# Patient Record
Sex: Female | Born: 1965 | Race: White | Hispanic: No | Marital: Single | Smoking: Never smoker
Health system: Southern US, Community
[De-identification: ages and names within clinical notes are randomized; demographics above are authoritative.]

## PROBLEM LIST (undated history)

## (undated) DIAGNOSIS — R609 Edema, unspecified: Secondary | ICD-10-CM

## (undated) DIAGNOSIS — R55 Syncope and collapse: Secondary | ICD-10-CM

## (undated) DIAGNOSIS — R600 Localized edema: Secondary | ICD-10-CM

## (undated) DIAGNOSIS — R0600 Dyspnea, unspecified: Secondary | ICD-10-CM

## (undated) DIAGNOSIS — N189 Chronic kidney disease, unspecified: Secondary | ICD-10-CM

## (undated) DIAGNOSIS — Z86711 Personal history of pulmonary embolism: Secondary | ICD-10-CM

## (undated) DIAGNOSIS — E282 Polycystic ovarian syndrome: Secondary | ICD-10-CM

## (undated) DIAGNOSIS — G4730 Sleep apnea, unspecified: Secondary | ICD-10-CM

## (undated) DIAGNOSIS — G43909 Migraine, unspecified, not intractable, without status migrainosus: Secondary | ICD-10-CM

## (undated) DIAGNOSIS — H5440 Blindness, one eye, unspecified eye: Secondary | ICD-10-CM

## (undated) DIAGNOSIS — M1990 Unspecified osteoarthritis, unspecified site: Secondary | ICD-10-CM

## (undated) DIAGNOSIS — E669 Obesity, unspecified: Secondary | ICD-10-CM

## (undated) DIAGNOSIS — Z9359 Other cystostomy status: Secondary | ICD-10-CM

## (undated) DIAGNOSIS — F32A Depression, unspecified: Secondary | ICD-10-CM

## (undated) DIAGNOSIS — I10 Essential (primary) hypertension: Secondary | ICD-10-CM

## (undated) DIAGNOSIS — F329 Major depressive disorder, single episode, unspecified: Secondary | ICD-10-CM

## (undated) DIAGNOSIS — F419 Anxiety disorder, unspecified: Secondary | ICD-10-CM

## (undated) DIAGNOSIS — I639 Cerebral infarction, unspecified: Secondary | ICD-10-CM

## (undated) HISTORY — DX: Unspecified osteoarthritis, unspecified site: M19.90

## (undated) HISTORY — DX: Essential (primary) hypertension: I10

## (undated) HISTORY — DX: Migraine, unspecified, not intractable, without status migrainosus: G43.909

## (undated) HISTORY — DX: Localized edema: R60.0

## (undated) HISTORY — PX: NASAL SINUS SURGERY: SHX719

## (undated) HISTORY — DX: Obesity, unspecified: E66.9

## (undated) HISTORY — DX: Syncope and collapse: R55

## (undated) HISTORY — DX: Polycystic ovarian syndrome: E28.2

## (undated) HISTORY — PX: ABDOMINAL SURGERY: SHX537

## (undated) HISTORY — DX: Edema, unspecified: R60.9

## (undated) HISTORY — DX: Cerebral infarction, unspecified: I63.9

---

## 1988-10-21 HISTORY — PX: NASAL SINUS SURGERY: SHX719

## 1999-06-13 ENCOUNTER — Other Ambulatory Visit: Admission: RE | Admit: 1999-06-13 | Discharge: 1999-06-13 | Payer: Self-pay | Admitting: Obstetrics and Gynecology

## 2000-06-13 ENCOUNTER — Other Ambulatory Visit: Admission: RE | Admit: 2000-06-13 | Discharge: 2000-06-13 | Payer: Self-pay | Admitting: Gynecology

## 2001-06-15 ENCOUNTER — Other Ambulatory Visit: Admission: RE | Admit: 2001-06-15 | Discharge: 2001-06-15 | Payer: Self-pay | Admitting: Gynecology

## 2001-12-30 ENCOUNTER — Emergency Department (HOSPITAL_COMMUNITY): Admission: EM | Admit: 2001-12-30 | Discharge: 2001-12-30 | Payer: Self-pay | Admitting: Emergency Medicine

## 2002-04-07 ENCOUNTER — Ambulatory Visit (HOSPITAL_COMMUNITY): Admission: RE | Admit: 2002-04-07 | Discharge: 2002-04-07 | Payer: Self-pay | Admitting: Family Medicine

## 2002-10-20 ENCOUNTER — Other Ambulatory Visit: Admission: RE | Admit: 2002-10-20 | Discharge: 2002-10-20 | Payer: Self-pay | Admitting: Gynecology

## 2002-10-21 HISTORY — PX: SHOULDER SURGERY: SHX246

## 2003-07-05 ENCOUNTER — Ambulatory Visit (HOSPITAL_BASED_OUTPATIENT_CLINIC_OR_DEPARTMENT_OTHER): Admission: RE | Admit: 2003-07-05 | Discharge: 2003-07-05 | Payer: Self-pay | Admitting: Orthopaedic Surgery

## 2003-10-24 ENCOUNTER — Other Ambulatory Visit: Admission: RE | Admit: 2003-10-24 | Discharge: 2003-10-24 | Payer: Self-pay | Admitting: Gynecology

## 2004-03-18 ENCOUNTER — Emergency Department (HOSPITAL_COMMUNITY): Admission: EM | Admit: 2004-03-18 | Discharge: 2004-03-18 | Payer: Self-pay | Admitting: Emergency Medicine

## 2004-10-24 ENCOUNTER — Other Ambulatory Visit: Admission: RE | Admit: 2004-10-24 | Discharge: 2004-10-24 | Payer: Self-pay | Admitting: Gynecology

## 2005-05-16 ENCOUNTER — Encounter: Admission: RE | Admit: 2005-05-16 | Discharge: 2005-05-16 | Payer: Self-pay | Admitting: Surgery

## 2005-06-26 ENCOUNTER — Ambulatory Visit: Admission: RE | Admit: 2005-06-26 | Discharge: 2005-06-26 | Payer: Self-pay | Admitting: Surgery

## 2005-11-14 ENCOUNTER — Other Ambulatory Visit: Admission: RE | Admit: 2005-11-14 | Discharge: 2005-11-14 | Payer: Self-pay | Admitting: Gynecology

## 2006-11-17 ENCOUNTER — Other Ambulatory Visit: Admission: RE | Admit: 2006-11-17 | Discharge: 2006-11-17 | Payer: Self-pay | Admitting: Gynecology

## 2007-06-22 HISTORY — PX: FOOT SURGERY: SHX648

## 2007-10-12 ENCOUNTER — Emergency Department (HOSPITAL_COMMUNITY): Admission: EM | Admit: 2007-10-12 | Discharge: 2007-10-13 | Payer: Self-pay | Admitting: Emergency Medicine

## 2007-11-10 ENCOUNTER — Encounter (INDEPENDENT_AMBULATORY_CARE_PROVIDER_SITE_OTHER): Payer: Self-pay | Admitting: General Surgery

## 2007-11-10 ENCOUNTER — Ambulatory Visit (HOSPITAL_COMMUNITY): Admission: RE | Admit: 2007-11-10 | Discharge: 2007-11-10 | Payer: Self-pay | Admitting: General Surgery

## 2007-11-10 HISTORY — PX: CHOLECYSTECTOMY: SHX55

## 2007-12-16 ENCOUNTER — Other Ambulatory Visit: Admission: RE | Admit: 2007-12-16 | Discharge: 2007-12-16 | Payer: Self-pay | Admitting: Gynecology

## 2008-02-24 ENCOUNTER — Emergency Department (HOSPITAL_COMMUNITY): Admission: EM | Admit: 2008-02-24 | Discharge: 2008-02-25 | Payer: Self-pay | Admitting: Emergency Medicine

## 2008-09-20 HISTORY — PX: KNEE ARTHROSCOPY: SHX127

## 2008-09-29 ENCOUNTER — Ambulatory Visit (HOSPITAL_COMMUNITY): Admission: RE | Admit: 2008-09-29 | Discharge: 2008-09-29 | Payer: Self-pay | Admitting: Orthopaedic Surgery

## 2009-04-19 ENCOUNTER — Other Ambulatory Visit: Admission: RE | Admit: 2009-04-19 | Discharge: 2009-04-19 | Payer: Self-pay | Admitting: Gynecology

## 2009-04-19 ENCOUNTER — Encounter: Payer: Self-pay | Admitting: Women's Health

## 2009-04-19 ENCOUNTER — Ambulatory Visit: Payer: Self-pay | Admitting: Women's Health

## 2009-07-04 ENCOUNTER — Ambulatory Visit (HOSPITAL_COMMUNITY): Admission: RE | Admit: 2009-07-04 | Discharge: 2009-07-04 | Payer: Self-pay | Admitting: Orthopaedic Surgery

## 2009-08-17 ENCOUNTER — Encounter: Admission: RE | Admit: 2009-08-17 | Discharge: 2009-08-17 | Payer: Self-pay | Admitting: Orthopaedic Surgery

## 2010-01-19 HISTORY — PX: KNEE ARTHROSCOPY: SHX127

## 2010-01-25 ENCOUNTER — Ambulatory Visit (HOSPITAL_COMMUNITY): Admission: RE | Admit: 2010-01-25 | Discharge: 2010-01-25 | Payer: Self-pay | Admitting: Orthopaedic Surgery

## 2010-10-18 ENCOUNTER — Ambulatory Visit: Payer: Self-pay | Admitting: Women's Health

## 2011-01-09 LAB — CBC
HCT: 42.2 % (ref 36.0–46.0)
Hemoglobin: 14.6 g/dL (ref 12.0–15.0)
MCHC: 34.5 g/dL (ref 30.0–36.0)
MCV: 94.7 fL (ref 78.0–100.0)
Platelets: 292 10*3/uL (ref 150–400)
RBC: 4.46 MIL/uL (ref 3.87–5.11)
RDW: 12.7 % (ref 11.5–15.5)
WBC: 10.7 10*3/uL — ABNORMAL HIGH (ref 4.0–10.5)

## 2011-01-09 LAB — BASIC METABOLIC PANEL
BUN: 13 mg/dL (ref 6–23)
CO2: 26 mEq/L (ref 19–32)
Calcium: 9.6 mg/dL (ref 8.4–10.5)
Chloride: 100 mEq/L (ref 96–112)
Creatinine, Ser: 0.8 mg/dL (ref 0.4–1.2)
GFR calc Af Amer: >60 mL/min (ref >60)
GFR calc non Af Amer: >60 mL/min (ref >60)
Glucose, Bld: 283 mg/dL — ABNORMAL HIGH (ref 70–99)
Potassium: 4.4 mEq/L (ref 3.5–5.1)
Sodium: 134 mEq/L — ABNORMAL LOW (ref 135–145)

## 2011-01-09 LAB — GLUCOSE, CAPILLARY
Glucose-Capillary: 250 mg/dL — ABNORMAL HIGH (ref 70–99)
Glucose-Capillary: 275 mg/dL — ABNORMAL HIGH (ref 70–99)

## 2011-03-05 NOTE — Op Note (Signed)
Kristen [name], Kristen [name]              ACCOUNT NO.:  [id number]   MEDICAL RECORD NO.:  [id number]          PATIENT TYPE:  AMB   LOCATION:  DAY                          FACILITY:  [facility]   PHYSICIAN:  Sammuel Hines. Daiva Nakayama, M.D. DATE OF BIRTH:  1966/09/17   DATE OF PROCEDURE:  11/10/2007  DATE OF DISCHARGE:                               OPERATIVE REPORT   PREOPERATIVE DIAGNOSIS:  Gallstones.   POSTOPERATIVE DIAGNOSIS:  Gallstones.   PROCEDURE:  Laparoscopic cholecystectomy with intraoperative  cholangiogram.   SURGEON:  Sammuel Hines. Daiva Nakayama, M.D.   ANESTHESIA:  General endotracheal.   PROCEDURE:  After informed consent was obtained, the patient was brought  to the operating room and placed in supine position on the operating  table.  After induction of general anesthesia, the patient's abdomen was  prepped with Betadine and draped in the usual sterile manner.  The area  above the umbilicus infiltrated with 0.25% Marcaine.   A small incision was made with a 15 blade knife.  This incision was  carried down through the subcutaneous tissue bluntly with appendiceal  retractors and Kelly clamp until the linea alba was identified.  The  linea alba was incised with a 15 blade knife, and each side was grasped  Kocher clamps and elevated anteriorly.  Of note, I did find mesh at this  location which we divided with a 15 blade knife.  The preperitoneal  space was then probed bluntly with a hemostat until the peritoneum was  opened and access was gained to the abdominal cavity.  A 0 Vicryl  pursestring stitch was placed in the fascia around the opening.  An  Hasson cannula was placed through the opening and anchored in place with  the previously placed Vicryl pursestring stitch.  The abdomen was then  insufflated carbon dioxide without difficulty.  The patient was placed  in reverse Trendelenburg position, rotated with the right side up.  Next, the epigastric area was infiltrated with 0.25% Marcaine.  A  small  incision was made with a 15 blade knife.  A 10-mm port was placed  bluntly through this incision into the abdominal cavity under direct  vision.  Sites were chosen laterally on the right side of the abdomen  for placement of 5-mm ports.  Each of these areas was infiltrated with  0.25% Marcaine.  Small stab incisions were made with a 15 blade knife,  and 5-mm ports were placed bluntly through these incisions into the  abdominal cavity under direct vision.  A blunt grasper was placed  through the lateral-most 5-mm port and used to grasp the dome of  gallbladder and elevate it anteriorly and superiorly.  Another blunt  grasper was placed through the other 5-mm port and used to retract on  the body and neck of the gallbladder.  A dissector was placed in the  epigastric port.  Using electrocautery, the peritoneal reflection of the  gallbladder neck was opened.  Blunt dissection was then carried out in  this area until the gallbladder neck/cystic duct junction was readily  identified and a good window was created.  A single clip was placed on  the gallbladder neck.  A small ductotomy was made just below the clip.  A 14-gauge Angiocath was placed percutaneously through the anterior  abdominal wall under direct vision.  A Reddick cholangiogram catheter  was placed through the Angiocath and flushed.  The Reddick catheter was  then placed within the cystic duct and anchored in place with a clip.  The cholangiogram was obtained.  It showed no filling defects, good  emptying in the duodenum, and good length on the cystic duct.  The  anchoring clip and catheters were removed from the patient.  Three clips  were placed proximally on the duct, and the duct was divided between the  two sets of clips.  Posteriorly, the cystic arteries were identified and  again dissected bluntly in a circumferential manner until a good window  was created.  Two clips were placed proximally and one distally in the   artery, and the artery was divided between the two.  Next, a  laparoscopic hook cautery device was used to separate the gallbladder  from the liver bed.  Prior to completely detaching the gallbladder from  the liver bed, the liver bed was inspected, and several small bleeding  points were coagulated with the cautery until the area was completely  hemostatic.  The gallbladder was then detached the rest of the way from  the liver bed without difficulty.  A laparoscopic bag was then inserted  through the epigastric port, and the gallbladder was placed in the bag  and the bag was sealed.  The abdomen was then irrigated with copious  amounts of saline until the effluent was clear.  The laparoscope was  moved to the epigastric port.  A gallbladder grabber was placed through  the [name] cannula and used to grasp the open end of the bag.  The bag  with the gallbladder was removed through the supraumbilical port with  the Hasson cannula without difficulty.  The fascial defect was closed  with the previously placed Vicryl pursestring stitch as well as with  another figure-of-eight #1 Novofil stitch to close the mesh.  The rest  of the ports were removed under direct vision and were found to be  hemostatic.  Gas was allowed to escape.  The skin incisions were closed  with interrupted 4-0 Monocryl subcuticular stitches.  Dermabond  dressings were applied.   The patient tolerated the procedure well.  At the end of the case, all  needle, sponges, and instrument counts were correct.  The patient was  then awakened and taken to recovery in stable condition.      Sammuel Hines. Daiva Nakayama, M.D.  Electronically Signed     PST/MEDQ  D:  11/10/2007  T:  11/10/2007  Job:  [id number]

## 2011-03-05 NOTE — Op Note (Signed)
Kristen [name], Kristen [name]              ACCOUNT NO.:  [id number]   MEDICAL RECORD NO.:  [id number]          PATIENT TYPE:  AMB   LOCATION:  SDS                          FACILITY:  [facility]   PHYSICIAN:  Monico Blitz. Dalldorf, M.D.DATE OF BIRTH:  1966-08-22   DATE OF PROCEDURE:  09/29/2008  DATE OF DISCHARGE:  09/29/2008                               OPERATIVE REPORT   PREOPERATIVE DIAGNOSIS:  Left knee chondromalacia.   POSTOPERATIVE DIAGNOSIS:  Left knee chondromalacia.   PROCEDURES:  1. Left knee chondroplasty.  2. Left knee arthroscopic lateral release.   ANESTHESIA:  General.   SURGEON:  Monico Blitz. Rhona Raider, MD   ASSISTANT:  Roselee Nova, PA   INDICATION FOR PROCEDURE:  The patient is a 45 year old woman with a  long history of left knee pain.  This persisted despite oral anti-  inflammatories and an injection as well as an exercise program.  By MRI  scan, she has things confined to patellofemoral joint.  She has pain,  which limits her ability to rest and work and walk, and she is offered  an arthroscopy.  An informed operative consent was obtained after  discussion of possible complications of reaction to anesthesia and  infection.   SUMMARY/FINDINGS OF PROCEDURE:  Under general anesthesia, an arthroscopy  of the left knee was performed.  Suprapatellar pouch was benign while  the patellofemoral joint exhibited focal degeneration at the apex of the  patella.  The intertrochlear groove cartilage was well maintained.  She  did track in a far lateral position and her lateral structures were felt  to be very tight.  She had a tight drive-through sign.  I elected to  perform an arthroscopic lateral release.  This did improve her patellar  tracking and took some tension off the patellofemoral joint, but  certainly did not normalize things.  Medial and lateral compartments  were benign with no evidence of articular or meniscal cartilage damage.   DESCRIPTION OF PROCEDURE:  The patient  was taken to the operating suite  where general anesthetic was applied without difficulty.  This case was  done in the main operating room due to the patient's size.  She was  positioned supine, and prepped and draped in normal sterile fashion.  After the administration of IV Kefzol, an arthroscopy of left knee was  performed through a total of 3 portals.  Findings were as noted above.  The procedure consisted of the chondroplasty and apex of the patella for  some grade 3 change, followed by arthroscopic lateral release.  The pump  pressure was decreased and we tried to control some bleeding with Bovie  cautery.  The knee was irrigated followed by placement of Marcaine with  epinephrine and morphine plus Depo-Medrol.  Adaptic was placed over her  portals followed by a dry gauze and loose Ace wrap.  Estimated blood  loss and intraoperative fluids were obtained from anesthesia records.   DISPOSITION:  The patient was extubated in the operating room and taken  to recovery room in stable condition.  Plans were for to go home on same  day, pending anesthesia  clearance.  She will follow up in less than a  week.  I will contact her by phone tonight.      Monico Blitz Rhona Raider, M.D.  Electronically Signed     PGD/MEDQ  D:  09/29/2008  T:  09/30/2008  Job:  [id number]

## 2011-03-08 NOTE — Op Note (Signed)
[name], Kristen [name]              ACCOUNT NO.:  [id number]   MEDICAL RECORD NO.:  [id number]          PATIENT TYPE:  AMB   LOCATION:  DFTL                         FACILITY:  [facility]   PHYSICIAN:  Thomas A. Cornett, M.D.DATE OF BIRTH:  1966-07-03   DATE OF PROCEDURE:  06/26/2005  DATE OF DISCHARGE:                                 OPERATIVE REPORT   PREOPERATIVE DIAGNOSIS:  Umbilical hernia.   POSTOPERATIVE DIAGNOSIS:  Umbilical hernia.   PROCEDURE:  Umbilical hernia repair with Ventralex mesh.   SURGEON:  Marcello Moores A. Cornett, M.D.   ANESTHESIA:  General endotracheal anesthesia with 10 mL of 0.25% Sensorcaine  local.   ESTIMATED BLOOD LOSS:  5 mL.   SPECIMENS:  None.   INDICATIONS FOR PROCEDURE:  The patient is a 45 year old morbidly obese  family who had periumbilical and lower abdominal pain. Workup revealed an  umbilical hernia and I recommended repair of this since this was causing  discomfort. She understood the risks of the procedure but wished to proceed.   DESCRIPTION OF PROCEDURE:  The patient was brought to the operating suite  and placed supine. After induction of general endotracheal anesthesia, her  abdomen was prepped and draped in a sterile fashion. She had a very large  pannus and this had to be retracted toward her head. An incision was made  just above the belly button. Dissection was carried down until I encountered  the fascia. There was a 3 cm umbilical defect and I dissected the umbilicus  away from the fascia to expose the defect circumferentially. This was  preperitoneal with no evidence of any intraperitoneal contents within it. In  the preperitoneal space, I used a sponge to create a small area and placed a  small Ventralex mesh in the preperitoneal space. I secured this to the  undersurface of the fascia with #0 Novofil circumferentially. I then closed  tissue over the mesh to cover this with #0 Novofil. A 2-0 Vicryl was used to  tack the umbilicus down  to the fascia and 4-0 Monocryl was used to close the  skin. All sponge, needle and instrument counts were counted and found to be  correct at this portion of the case. Sterile dressings were applied. The  patient was awoke and taken to recovery in satisfactory condition.      Thomas A. Cornett, M.D.  Electronically Signed    TAC/MEDQ  D:  06/26/2005  T:  06/26/2005  Job:  [id number]   cc:   [facility]

## 2011-03-08 NOTE — Op Note (Signed)
[name], [name]                      ACCOUNT NO.:  [id number]   MEDICAL RECORD NO.:  [id number]                   PATIENT TYPE:  AMB   LOCATION:  DSC                                  FACILITY:  [facility]   PHYSICIAN:  Monico Blitz. Rhona Raider, M.D.             DATE OF BIRTH:  [birth year]   DATE OF PROCEDURE:  07/05/2003  DATE OF DISCHARGE:                                 OPERATIVE REPORT   PREOPERATIVE DIAGNOSIS:  1. Left shoulder impingement.  2. Left shoulder AC pain.   POSTOPERATIVE DIAGNOSIS:  1. Left shoulder impingement.  2. Left shoulder AC pain.   PROCEDURE:  1. Left shoulder arthroscopic acromioplasty.  2. Left shoulder arthroscopic AC resection.  3. Left shoulder arthroscopic debridement, partial rotator cuff tear.   ANESTHESIA:  General.   SURGEON:  Monico Blitz. Rhona Raider, M.D.   ASSISTANT:  Roselee Nova, P.A.   INDICATIONS FOR PROCEDURE:  The patient is a 45 year old woman with a long  history of left shoulder pain. This has persisted despite oral anti-  inflammatories and activity restriction.  She has responded in a transient  way to two subacromial injections.  At this point, she has pain with rest  and pain with activity and is offered an arthroscopy.  Informed operative  consent was obtained after discussion of the possible complications of,  reaction to anesthesia and infection.   DESCRIPTION OF PROCEDURE:  The patient was taken to the operating room where  general anesthesia was applied without difficulty.  She was positioned in a  beach chair position and prepped and draped in the usual sterile fashion.  After administration of preoperative IV antibiotics, an arthroscopy of the  left shoulder was performed through a total of four portals.  Glenohumeral  joint showed no degenerative change in the biceps tendon, rotator cuff, and  labral structures all appear benign.  In the subacromial space, she had a  great deal of bursitis with perhaps a partial  thickness rotator cuff tear. A  debridement was done.  I performed an acromioplasty through the lateral  position. I then finished this through the posterior position. This was  extremely difficult due to the very large size of her arm.  I had to revise  her posterior portal in a more inferior position and that was the reason for  the fourth portal. She did have bone on bone contact of the [facility] joint and  this was decompressed through the anterior portal creating about 1 cm space  between the distal clavicle and the acromion. The shoulder was thoroughly  irrigated at the end of the case followed by injection with the usual agents  plus Depo-Medrol.  Simple sutures of nylon were used to loosely  reapproximate the portals followed by Adaptic and a dry gauze dressing with  tape.  Estimated blood loss and intraoperative fluids can be obtained from  anesthesia records.   DISPOSITION:  The patient was extubated in  the operating room and taken to  the recovery room in stable condition.  Plans were for her to go home the  same day and follow up in the office in less than a week.  I will contact  her by phone tonight.                                               Monico Blitz Rhona Raider, M.D.    PGD/MEDQ  D:  07/05/2003  T:  07/05/2003  Job:  [id number]

## 2011-03-20 ENCOUNTER — Encounter: Payer: Self-pay | Admitting: Women's Health

## 2011-05-03 ENCOUNTER — Ambulatory Visit (INDEPENDENT_AMBULATORY_CARE_PROVIDER_SITE_OTHER): Payer: Self-pay | Admitting: General Surgery

## 2011-05-24 ENCOUNTER — Ambulatory Visit (INDEPENDENT_AMBULATORY_CARE_PROVIDER_SITE_OTHER): Payer: BC Managed Care – PPO | Admitting: Women's Health

## 2011-05-24 ENCOUNTER — Other Ambulatory Visit (HOSPITAL_COMMUNITY)
Admission: RE | Admit: 2011-05-24 | Discharge: 2011-05-24 | Disposition: A | Discharge status: Home / Self Care | Payer: BC Managed Care – PPO | Source: Ambulatory Visit | Attending: Women's Health | Admitting: Women's Health

## 2011-05-24 ENCOUNTER — Encounter: Payer: Self-pay | Admitting: Women's Health

## 2011-05-24 VITALS — BP 140/82 | Ht 67.75 in | Wt 366.0 lb

## 2011-05-24 DIAGNOSIS — Z01419 Encounter for gynecological examination (general) (routine) without abnormal findings: Secondary | ICD-10-CM

## 2011-05-24 DIAGNOSIS — B3731 Acute candidiasis of vulva and vagina: Secondary | ICD-10-CM

## 2011-05-24 DIAGNOSIS — E282 Polycystic ovarian syndrome: Secondary | ICD-10-CM

## 2011-05-24 DIAGNOSIS — B373 Candidiasis of vulva and vagina: Secondary | ICD-10-CM

## 2011-05-24 MED ORDER — FLUCONAZOLE 100 MG PO TABS: 200.0000 mg | ORAL_TABLET | Freq: Every day | ORAL | Status: AC

## 2011-05-24 MED ORDER — MEDROXYPROGESTERONE ACETATE 10 MG PO TABS: 10.0000 mg | ORAL_TABLET | ORAL | Status: DC

## 2011-05-24 NOTE — Progress Notes (Signed)
Kristen [name] Mar 07, 1966 [id number]    History:    The patient presents for annual exam.  45 yo G0 not sexually active, history of PCO S., takes Provera 30 mg by mouth daily day one through 10, and has a monthly cycle with that. She is morbidly obese, hypertensive, diabetic, and asthmatic -labs and medications are done with her primary care.    Past medical history, past surgical history, family history and social history were all reviewed and documented in the EPIC chart.   ROS:  A 14 point ROS was performed and pertinent positives and negatives are included in the history.  Exam:  Filed Vitals:   05/24/11 1053  BP: 140/82    General appearance:  Normal Head/Neck:  Normal, without cervical or supraclavicular adenopathy. Thyroid:  Symmetrical, normal in size, without palpable masses or nodularity. Respiratory  Effort:  Normal  Auscultation:  Clear without wheezing or rhonchi Cardiovascular  Auscultation:  Regular rate, without rubs, murmurs or gallops  Edema/varicosities:  Not grossly evident Abdominal  Masses/tenderness:  Soft,nontender, without masses, guarding or rebound.  Liver/spleen:  No organomegaly noted  Hernia:  None appreciated  Occult test:   Skin  Inspection:  Grossly normal  Palpation:  Grossly normal Neurologic/psychiatric  Orientation:  Normal with appropriate conversation.  Mood/affect:  Normal  Genitourinary    Breasts: Examined lying and sitting.     Right: Without masses, retractions, discharge or axillary adenopathy.     Left: Without masses, retractions, discharge or axillary adenopathy.   Inguinal/mons:  Normal without inguinal adenopathy  External genitalia:  Normal  BUS/Urethra/Skene's glands:  Normal  Bladder:  Normal  Vagina:  Normal  Cervix:  Normal  Uterus:   Limited exam due to morbid obesity    Ad enexa   Rt: Without masses or tenderness.   Lt: Without masses or tenderness.  Anus and perineum: Normal  Digital rectal exam: Normal  sphincter tone without palpated masses or tenderness  Assessment/Plan:  45 y.o. year old female for annual exam.   Prescription proper use of Provera 30 mg by mouth daily day one through 10 of each month was given and reviewed will call if no cycle after  taking.. Limited exam due to morbid obesity, will schedule a pelvic ultrasound here at the office. Reviewed importance for calcium rich diet, increasing daily exercise, decreasing calories for weight loss. Pap, wet prep only today. SBE, yearly mammogram which she is overdue for and she will get that scheduled. Diflucan 200 by mouth daily for several days for yeast was given gave her prescription for 30 she knows to only use as needed and limited use was reviewed. Turned to the office of for yeast symptoms do not improve. She states they do improved as her blood sugars are better. She did have knee replacement surgery greater than a year ago that and states that has helped her mobility tremendously. She has not been sexually active in many years, did review condom use if she does become sexually active. Weight [employer]chers was reviewed and encourage for weight loss.    Huel Cote MD, 11:48 AM 05/24/2011

## 2011-06-17 ENCOUNTER — Other Ambulatory Visit: Payer: BC Managed Care – PPO

## 2011-06-17 ENCOUNTER — Ambulatory Visit: Payer: BC Managed Care – PPO | Admitting: Women's Health

## 2011-07-11 LAB — CBC
HCT: 39.2
Hemoglobin: 13.6
MCHC: 34.7
MCV: 92.1
Platelets: 364
RBC: 4.26
RDW: 12.6
WBC: 11.7 — ABNORMAL HIGH

## 2011-07-11 LAB — COMPREHENSIVE METABOLIC PANEL
ALT: 23
AST: 20
Albumin: 3.2 — ABNORMAL LOW
Alkaline Phosphatase: 56
BUN: 12
CO2: 27
Calcium: 9.2
Chloride: 103
Creatinine, Ser: 1.1
GFR calc Af Amer: >60
GFR calc non Af Amer: 55 — ABNORMAL LOW
Glucose, Bld: 309 — ABNORMAL HIGH
Potassium: 4.3
Sodium: 139
Total Bilirubin: 0.5
Total Protein: 6.3

## 2011-07-11 LAB — DIFFERENTIAL
Basophils Absolute: 0
Basophils Relative: 0
Eosinophils Absolute: 0.1
Eosinophils Relative: 1
Lymphocytes Relative: 19
Lymphs Abs: 2.2
Monocytes Absolute: 0.8
Monocytes Relative: 7
Neutro Abs: 8.6 — ABNORMAL HIGH
Neutrophils Relative %: 73

## 2011-07-11 LAB — URINALYSIS, ROUTINE W REFLEX MICROSCOPIC
Bilirubin Urine: NEGATIVE
Glucose, UA: >1000 — AB
Hgb urine dipstick: NEGATIVE
Ketones, ur: NEGATIVE
Leukocytes, UA: NEGATIVE
Nitrite: NEGATIVE
Protein, ur: 100 — AB
Specific Gravity, Urine: 1.019
Urobilinogen, UA: 0.2
pH: 6.5

## 2011-07-11 LAB — URINE MICROSCOPIC-ADD ON

## 2011-07-11 LAB — PREGNANCY, URINE: Preg Test, Ur: NEGATIVE

## 2011-07-17 LAB — DIFFERENTIAL
Basophils Absolute: 0.1
Basophils Relative: 1
Eosinophils Absolute: 0.2
Eosinophils Relative: 1
Lymphocytes Relative: 24
Lymphs Abs: 3.2
Monocytes Absolute: 1.1 — ABNORMAL HIGH
Monocytes Relative: 9
Neutro Abs: 8.5 — ABNORMAL HIGH
Neutrophils Relative %: 65

## 2011-07-17 LAB — COMPREHENSIVE METABOLIC PANEL
ALT: 23
AST: 19
Albumin: 3.4 — ABNORMAL LOW
Alkaline Phosphatase: 55
BUN: 13
CO2: 29
Calcium: 9.2
Chloride: 100
Creatinine, Ser: 0.91
GFR calc Af Amer: >60
GFR calc non Af Amer: >60
Glucose, Bld: 111 — ABNORMAL HIGH
Potassium: 3.5
Sodium: 138
Total Bilirubin: 0.4
Total Protein: 6.8

## 2011-07-17 LAB — CBC
HCT: 40.2
Hemoglobin: 13.7
MCHC: 34
MCV: 94.3
Platelets: 351
RBC: 4.27
RDW: 12.9
WBC: 13.1 — ABNORMAL HIGH

## 2011-07-17 LAB — POCT CARDIAC MARKERS
CKMB, poc: 3.7
Myoglobin, poc: 149
Operator id: 257131
Troponin i, poc: <0.05

## 2011-07-17 LAB — B-NATRIURETIC PEPTIDE (CONVERTED LAB): Pro B Natriuretic peptide (BNP): 36

## 2011-07-25 LAB — BASIC METABOLIC PANEL
BUN: 6 mg/dL (ref 6–23)
CO2: 25 mEq/L (ref 19–32)
Calcium: 9 mg/dL (ref 8.4–10.5)
Chloride: 105 mEq/L (ref 96–112)
Creatinine, Ser: 0.79 mg/dL (ref 0.4–1.2)
GFR calc Af Amer: >60 mL/min (ref >60)
GFR calc non Af Amer: >60 mL/min (ref >60)
Glucose, Bld: 194 mg/dL — ABNORMAL HIGH (ref 70–99)
Potassium: 4.4 mEq/L (ref 3.5–5.1)
Sodium: 138 mEq/L (ref 135–145)

## 2011-07-25 LAB — CBC
HCT: 39.5 % (ref 36.0–46.0)
Hemoglobin: 13.3 g/dL (ref 12.0–15.0)
MCHC: 33.6 g/dL (ref 30.0–36.0)
MCV: 95.5 fL (ref 78.0–100.0)
Platelets: 291 10*3/uL (ref 150–400)
RBC: 4.13 MIL/uL (ref 3.87–5.11)
RDW: 13 % (ref 11.5–15.5)
WBC: 11.2 10*3/uL — ABNORMAL HIGH (ref 4.0–10.5)

## 2011-07-25 LAB — GLUCOSE, CAPILLARY
Glucose-Capillary: 120 mg/dL — ABNORMAL HIGH (ref 70–99)
Glucose-Capillary: 145 mg/dL — ABNORMAL HIGH (ref 70–99)

## 2011-07-26 LAB — DIFFERENTIAL
Basophils Absolute: 0
Basophils Relative: 0
Eosinophils Absolute: 0.1 — ABNORMAL LOW
Eosinophils Relative: 1
Lymphocytes Relative: 25
Lymphs Abs: 2.7
Monocytes Absolute: 0.9
Monocytes Relative: 8
Neutro Abs: 7.2
Neutrophils Relative %: 65

## 2011-07-26 LAB — URINALYSIS, ROUTINE W REFLEX MICROSCOPIC
Bilirubin Urine: NEGATIVE
Glucose, UA: NEGATIVE
Hgb urine dipstick: NEGATIVE
Ketones, ur: NEGATIVE
Leukocytes, UA: NEGATIVE
Nitrite: NEGATIVE
Protein, ur: 100 — AB
Specific Gravity, Urine: 1.011
Urobilinogen, UA: 0.2
pH: 7

## 2011-07-26 LAB — URINE MICROSCOPIC-ADD ON

## 2011-07-26 LAB — CBC
HCT: 40.1
Hemoglobin: 13.9
MCHC: 34.8
MCV: 91.6
Platelets: 409 — ABNORMAL HIGH
RBC: 4.38
RDW: 12.4
WBC: 11 — ABNORMAL HIGH

## 2011-07-26 LAB — COMPREHENSIVE METABOLIC PANEL
ALT: 66 — ABNORMAL HIGH
AST: 62 — ABNORMAL HIGH
Albumin: 3 — ABNORMAL LOW
Alkaline Phosphatase: 52
BUN: 9
CO2: 28
Calcium: 8.7
Chloride: 101
Creatinine, Ser: 0.84
GFR calc Af Amer: >60
GFR calc non Af Amer: >60
Glucose, Bld: 172 — ABNORMAL HIGH
Potassium: 3.3 — ABNORMAL LOW
Sodium: 136
Total Bilirubin: 0.5
Total Protein: 6.3

## 2011-07-26 LAB — LIPASE, BLOOD: Lipase: 33

## 2011-08-27 ENCOUNTER — Other Ambulatory Visit: Payer: Self-pay | Admitting: Obstetrics and Gynecology

## 2011-08-30 ENCOUNTER — Ambulatory Visit (INDEPENDENT_AMBULATORY_CARE_PROVIDER_SITE_OTHER): Payer: BC Managed Care – PPO | Admitting: Women's Health

## 2011-08-30 ENCOUNTER — Ambulatory Visit (INDEPENDENT_AMBULATORY_CARE_PROVIDER_SITE_OTHER): Payer: BC Managed Care – PPO

## 2011-08-30 DIAGNOSIS — N83209 Unspecified ovarian cyst, unspecified side: Secondary | ICD-10-CM

## 2011-08-30 DIAGNOSIS — E282 Polycystic ovarian syndrome: Secondary | ICD-10-CM

## 2011-08-30 DIAGNOSIS — B373 Candidiasis of vulva and vagina: Secondary | ICD-10-CM

## 2011-08-30 DIAGNOSIS — B3731 Acute candidiasis of vulva and vagina: Secondary | ICD-10-CM

## 2011-08-30 MED ORDER — FLUCONAZOLE 100 MG PO TABS: 200.0000 mg | ORAL_TABLET | Freq: Every day | ORAL | Status: AC

## 2011-08-30 NOTE — Progress Notes (Signed)
  Presents for ultrasound, morbidly obese, history of PCO S., only cycles with Provera 30 for 10 days each month.  Ultrasound: No uterine abnormalities noted, left ovary normal, right ovarian thick walled vascular cyst 22 x 23 x 16 mm. Endometrium 5.6 mm, states cycle is due.  Plan: Ultrasound to check for resolution of cyst in 3 months, will call with cycle to schedule after her cycle. Also gave her prescription for Diflucan 200 by mouth to take when necessary with antibiotics. History of asthma, diabetes, hypertension, and states when takes antibiotics has problems with vaginal yeast. Denies symptoms currently. Aware it is to be used sparingly due to liver toxicity.

## 2011-09-04 ENCOUNTER — Other Ambulatory Visit: Payer: Self-pay | Admitting: Women's Health

## 2011-09-04 ENCOUNTER — Other Ambulatory Visit: Payer: BC Managed Care – PPO

## 2011-09-04 ENCOUNTER — Ambulatory Visit: Payer: BC Managed Care – PPO | Admitting: Women's Health

## 2011-09-04 DIAGNOSIS — E282 Polycystic ovarian syndrome: Secondary | ICD-10-CM

## 2011-09-30 ENCOUNTER — Ambulatory Visit (INDEPENDENT_AMBULATORY_CARE_PROVIDER_SITE_OTHER): Payer: Self-pay | Admitting: General Surgery

## 2011-12-20 DIAGNOSIS — I639 Cerebral infarction, unspecified: Secondary | ICD-10-CM

## 2011-12-20 HISTORY — DX: Cerebral infarction, unspecified: I63.9

## 2012-01-30 ENCOUNTER — Other Ambulatory Visit: Payer: Self-pay | Admitting: Obstetrics and Gynecology

## 2012-01-30 DIAGNOSIS — Z1231 Encounter for screening mammogram for malignant neoplasm of breast: Secondary | ICD-10-CM

## 2012-03-05 ENCOUNTER — Ambulatory Visit
Admission: RE | Admit: 2012-03-05 | Discharge: 2012-03-05 | Disposition: A | Discharge status: Home / Self Care | Payer: BC Managed Care – PPO | Source: Ambulatory Visit | Attending: Obstetrics and Gynecology | Admitting: Obstetrics and Gynecology

## 2012-03-05 DIAGNOSIS — Z1231 Encounter for screening mammogram for malignant neoplasm of breast: Secondary | ICD-10-CM

## 2012-04-02 ENCOUNTER — Other Ambulatory Visit: Payer: Self-pay | Admitting: *Deleted

## 2012-04-02 DIAGNOSIS — H4710 Unspecified papilledema: Secondary | ICD-10-CM

## 2012-04-03 ENCOUNTER — Ambulatory Visit
Admission: RE | Admit: 2012-04-03 | Discharge: 2012-04-03 | Disposition: A | Discharge status: Home / Self Care | Payer: BC Managed Care – PPO | Source: Ambulatory Visit | Attending: *Deleted | Admitting: *Deleted

## 2012-04-03 DIAGNOSIS — H4710 Unspecified papilledema: Secondary | ICD-10-CM

## 2012-04-03 MED ORDER — GADOBENATE DIMEGLUMINE 529 MG/ML IV SOLN
20.0000 mL | Freq: Once | INTRAVENOUS | Status: AC | PRN
  Administered 2012-04-03: 20 mL via INTRAVENOUS

## 2012-04-05 ENCOUNTER — Other Ambulatory Visit: Payer: BC Managed Care – PPO

## 2012-04-13 ENCOUNTER — Other Ambulatory Visit: Payer: Self-pay | Admitting: Neurology

## 2012-04-13 DIAGNOSIS — H47019 Ischemic optic neuropathy, unspecified eye: Secondary | ICD-10-CM

## 2012-04-14 ENCOUNTER — Ambulatory Visit
Admission: RE | Admit: 2012-04-14 | Discharge: 2012-04-14 | Disposition: A | Discharge status: Home / Self Care | Payer: BC Managed Care – PPO | Source: Ambulatory Visit | Attending: Neurology | Admitting: Neurology

## 2012-04-14 DIAGNOSIS — H47019 Ischemic optic neuropathy, unspecified eye: Secondary | ICD-10-CM

## 2012-04-17 ENCOUNTER — Ambulatory Visit (HOSPITAL_COMMUNITY)
Admission: RE | Admit: 2012-04-17 | Discharge: 2012-04-17 | Disposition: A | Discharge status: Home / Self Care | Payer: BC Managed Care – PPO | Source: Ambulatory Visit | Attending: Neurology | Admitting: Neurology

## 2012-04-17 DIAGNOSIS — H3400 Transient retinal artery occlusion, unspecified eye: Secondary | ICD-10-CM | POA: Insufficient documentation

## 2012-04-17 DIAGNOSIS — H469 Unspecified optic neuritis: Secondary | ICD-10-CM

## 2012-04-17 DIAGNOSIS — R519 Headache, unspecified: Secondary | ICD-10-CM

## 2012-04-17 DIAGNOSIS — I998 Other disorder of circulatory system: Secondary | ICD-10-CM

## 2012-04-17 DIAGNOSIS — G459 Transient cerebral ischemic attack, unspecified: Secondary | ICD-10-CM

## 2012-04-17 NOTE — Progress Notes (Signed)
VASCULAR LAB PRELIMINARY  PRELIMINARY  PRELIMINARY  PRELIMINARY  Carotid duplex completed completed.    Preliminary report:  No significant ICA stenosis or plaque noted bilaterlly  Salvatore Poe,  RVT 04/17/2012, 6:45 PM

## 2012-06-01 ENCOUNTER — Encounter: Payer: BC Managed Care – PPO | Admitting: Women's Health

## 2012-06-10 ENCOUNTER — Encounter: Payer: BC Managed Care – PPO | Admitting: Women's Health

## 2012-06-18 ENCOUNTER — Ambulatory Visit (INDEPENDENT_AMBULATORY_CARE_PROVIDER_SITE_OTHER): Payer: BC Managed Care – PPO | Admitting: Women's Health

## 2012-06-18 ENCOUNTER — Encounter: Payer: Self-pay | Admitting: Women's Health

## 2012-06-18 VITALS — BP 118/70 | Ht 67.0 in | Wt 392.0 lb

## 2012-06-18 DIAGNOSIS — B3731 Acute candidiasis of vulva and vagina: Secondary | ICD-10-CM

## 2012-06-18 DIAGNOSIS — I10 Essential (primary) hypertension: Secondary | ICD-10-CM | POA: Insufficient documentation

## 2012-06-18 DIAGNOSIS — E282 Polycystic ovarian syndrome: Secondary | ICD-10-CM

## 2012-06-18 DIAGNOSIS — J45909 Unspecified asthma, uncomplicated: Secondary | ICD-10-CM

## 2012-06-18 DIAGNOSIS — Z6841 Body Mass Index (BMI) 40.0 and over, adult: Secondary | ICD-10-CM

## 2012-06-18 DIAGNOSIS — E119 Type 2 diabetes mellitus without complications: Secondary | ICD-10-CM

## 2012-06-18 DIAGNOSIS — Z01419 Encounter for gynecological examination (general) (routine) without abnormal findings: Secondary | ICD-10-CM

## 2012-06-18 DIAGNOSIS — B373 Candidiasis of vulva and vagina: Secondary | ICD-10-CM

## 2012-06-18 DIAGNOSIS — E139 Other specified diabetes mellitus without complications: Secondary | ICD-10-CM | POA: Insufficient documentation

## 2012-06-18 MED ORDER — MEDROXYPROGESTERONE ACETATE 10 MG PO TABS: 10.0000 mg | ORAL_TABLET | ORAL | Status: DC

## 2012-06-18 MED ORDER — FLUCONAZOLE 100 MG PO TABS: 100.0000 mg | ORAL_TABLET | Freq: Every day | ORAL | Status: AC

## 2012-06-18 NOTE — Progress Notes (Signed)
Kristen [name] Sep 02, 1966 [id number]    History:    The patient presents for annual exam.  Having a light menstrual cycle after Provera 10mg  for 10 days of each month. History of PCO S with amenorrhea. Had used Provera 30 mg daily day 1 through 10 to have a cycle. Normal ultrasound 2012. Has numerous medical problems including diabetes, now on insulin, hemoglobin A1c was 10 currently down to  7. 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 History of normal Paps and mammograms. Not sexually active for many years. Has gained 30 pounds in the past year.   Past medical history, past surgical history, family history and social history were all reviewed and documented in the EPIC chart. Currently on disability due to right eye optic nerve problems causing poor vision. Lives with sister and brother-in-law.  ROS:  A  ROS was performed and pertinent positives and negatives are included in the history.  Exam:  Filed Vitals:   06/18/12 0958  BP: 118/70    General appearance:  Obese  Head/Neck:  Normal, without cervical or supraclavicular adenopathy. Thyroid:  Symmetrical, normal in size, without palpable masses or nodularity. Respiratory  Effort:  Normal  Auscultation:  Clear without wheezing or rhonchi Cardiovascular  Auscultation:  Regular rate, without rubs, murmurs or gallops  Edema/varicosities:  Not grossly evident Abdominal  Soft,nontender, without masses, guarding or rebound.  Liver/spleen:  No organomegaly noted  Hernia:  None appreciated  Skin  Inspection:  Grossly normal  Palpation:  Grossly normal Neurologic/psychiatric  Orientation:  Normal with appropriate conversation.  Mood/affect:  Normal  Genitourinary    Breasts: Examined lying and sitting.     Right: Without masses, retractions, discharge or axillary adenopathy.     Left: Without masses, retractions, discharge or axillary  adenopathy.   Inguinal/mons:  Normal without inguinal adenopathy  External genitalia:  Normal  BUS/Urethra/Skene's glands:  Normal  Bladder:  Normal  Vagina:  Normal  Cervix:  Normal  Uterus:   normal in size, shape and contour.  Midline and mobile/limited exam due to obesity  Adnexa/parametria:     Rt: Without masses or tenderness.   Lt: Without masses or tenderness.  Anus and perineum: Normal  Digital rectal exam: Normal sphincter tone without palpated masses or tenderness  Assessment/Plan:  46 y.o. S. WF G0 for annual exam with no GYN complaints.  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 monthly cycle after Provera 10mg  for 10 days Diabetes/hypertension/asthma-primary care labs and meds  Plan: SBE's, continue annual mammogram, calcium rich diet, vitamin D 1000 daily encouraged. Long discussion on importance of weight loss and increasing exercise for general health. Provera 10 mg by mouth daily day 1 through 10 of each month instructed to call if no bleeding. No Pap history of normal Paps new screening guidelines reviewed. Diflucan 100 as needed for vaginal itching prescription given, proper use given. Reviewed relationship between high blood sugars and yeast.   Huel Cote WHNP, 2:05 PM 06/18/2012

## 2012-06-18 NOTE — Patient Instructions (Signed)

## 2012-06-19 ENCOUNTER — Encounter: Payer: Self-pay | Admitting: Women's Health

## 2012-08-02 ENCOUNTER — Emergency Department (HOSPITAL_COMMUNITY)
Admission: EM | Admit: 2012-08-02 | Discharge: 2012-08-02 | Disposition: A | Discharge status: Home / Self Care | Payer: No Typology Code available for payment source | Attending: Emergency Medicine | Admitting: Emergency Medicine

## 2012-08-02 ENCOUNTER — Encounter (HOSPITAL_COMMUNITY): Payer: Self-pay | Admitting: Emergency Medicine

## 2012-08-02 ENCOUNTER — Emergency Department (HOSPITAL_COMMUNITY): Payer: No Typology Code available for payment source

## 2012-08-02 DIAGNOSIS — I10 Essential (primary) hypertension: Secondary | ICD-10-CM | POA: Insufficient documentation

## 2012-08-02 DIAGNOSIS — E119 Type 2 diabetes mellitus without complications: Secondary | ICD-10-CM | POA: Insufficient documentation

## 2012-08-02 DIAGNOSIS — J3489 Other specified disorders of nose and nasal sinuses: Secondary | ICD-10-CM | POA: Insufficient documentation

## 2012-08-02 DIAGNOSIS — M545 Low back pain, unspecified: Secondary | ICD-10-CM | POA: Insufficient documentation

## 2012-08-02 DIAGNOSIS — R404 Transient alteration of awareness: Secondary | ICD-10-CM | POA: Insufficient documentation

## 2012-08-02 DIAGNOSIS — Z794 Long term (current) use of insulin: Secondary | ICD-10-CM | POA: Insufficient documentation

## 2012-08-02 DIAGNOSIS — T1490XA Injury, unspecified, initial encounter: Secondary | ICD-10-CM | POA: Insufficient documentation

## 2012-08-02 DIAGNOSIS — K224 Dyskinesia of esophagus: Secondary | ICD-10-CM | POA: Insufficient documentation

## 2012-08-02 DIAGNOSIS — I69998 Other sequelae following unspecified cerebrovascular disease: Secondary | ICD-10-CM | POA: Insufficient documentation

## 2012-08-02 DIAGNOSIS — M549 Dorsalgia, unspecified: Secondary | ICD-10-CM | POA: Insufficient documentation

## 2012-08-02 DIAGNOSIS — H539 Unspecified visual disturbance: Secondary | ICD-10-CM | POA: Insufficient documentation

## 2012-08-02 MED ORDER — HYDROCODONE-ACETAMINOPHEN 5-325 MG PO TABS
1.0000 | ORAL_TABLET | Freq: Once | ORAL | Status: AC
  Administered 2012-08-02: 1 via ORAL
  Filled 2012-08-02: qty 1

## 2012-08-02 MED ORDER — HYDROCODONE-ACETAMINOPHEN 5-325 MG PO TABS: 1.0000 | ORAL_TABLET | Freq: Four times a day (QID) | ORAL | Status: DC | PRN

## 2012-08-02 MED ORDER — IBUPROFEN 800 MG PO TABS
800.0000 mg | ORAL_TABLET | Freq: Once | ORAL | Status: AC
  Administered 2012-08-02: 800 mg via ORAL
  Filled 2012-08-02: qty 1
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
## 2012-08-02 NOTE — ED Notes (Signed)
Patient given discharge instructions, information, prescriptions, and diet order. Patient states that they adequately understand discharge information given and to return to ED if symptoms return or worsen.     
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
## 2012-08-03 NOTE — ED Provider Notes (Signed)
Medical screening examination/treatment/procedure(s) were performed by non-physician practitioner and as supervising physician I was immediately available for consultation/collaboration.  Orlie Dakin, MD 08/03/12 [phone]

## 2012-10-01 ENCOUNTER — Ambulatory Visit (INDEPENDENT_AMBULATORY_CARE_PROVIDER_SITE_OTHER): Payer: BC Managed Care – PPO | Admitting: Women's Health

## 2012-10-01 ENCOUNTER — Encounter: Payer: Self-pay | Admitting: Women's Health

## 2012-10-01 DIAGNOSIS — E7800 Pure hypercholesterolemia, unspecified: Secondary | ICD-10-CM

## 2012-10-01 DIAGNOSIS — N920 Excessive and frequent menstruation with regular cycle: Secondary | ICD-10-CM
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
## 2012-10-07 ENCOUNTER — Ambulatory Visit (INDEPENDENT_AMBULATORY_CARE_PROVIDER_SITE_OTHER): Payer: BC Managed Care – PPO | Admitting: Women's Health

## 2012-10-07 ENCOUNTER — Ambulatory Visit (INDEPENDENT_AMBULATORY_CARE_PROVIDER_SITE_OTHER): Payer: BC Managed Care – PPO

## 2012-10-07 ENCOUNTER — Encounter: Payer: Self-pay | Admitting: Women's Health

## 2012-10-07 ENCOUNTER — Other Ambulatory Visit: Payer: Self-pay | Admitting: Gynecology

## 2012-10-07 DIAGNOSIS — N920 Excessive and frequent menstruation with regular cycle: Secondary | ICD-10-CM

## 2012-10-07 DIAGNOSIS — D259 Leiomyoma of uterus, unspecified: Secondary | ICD-10-CM

## 2012-10-07 DIAGNOSIS — N8300 Follicular cyst of ovary, unspecified side: Secondary | ICD-10-CM
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
## 2012-10-30 DIAGNOSIS — I951 Orthostatic hypotension: Secondary | ICD-10-CM | POA: Insufficient documentation

## 2012-10-30 DIAGNOSIS — R51 Headache: Secondary | ICD-10-CM | POA: Insufficient documentation

## 2012-10-30 DIAGNOSIS — R519 Headache, unspecified: Secondary | ICD-10-CM | POA: Insufficient documentation

## 2012-10-30 DIAGNOSIS — H47019 Ischemic optic neuropathy, unspecified eye: Secondary | ICD-10-CM | POA: Insufficient documentation

## 2012-11-03 DIAGNOSIS — G453 Amaurosis fugax: Secondary | ICD-10-CM | POA: Insufficient documentation

## 2012-11-25 ENCOUNTER — Other Ambulatory Visit: Payer: Self-pay | Admitting: Family Medicine

## 2012-11-25 ENCOUNTER — Ambulatory Visit
Admission: RE | Admit: 2012-11-25 | Discharge: 2012-11-25 | Disposition: A | Discharge status: Home / Self Care | Payer: BC Managed Care – PPO | Source: Ambulatory Visit | Attending: Family Medicine | Admitting: Family Medicine

## 2012-11-25 DIAGNOSIS — M545 Low back pain, unspecified: Secondary | ICD-10-CM

## 2012-11-26 ENCOUNTER — Other Ambulatory Visit: Payer: BC Managed Care – PPO

## 2012-11-28 ENCOUNTER — Other Ambulatory Visit: Payer: BC Managed Care – PPO

## 2013-01-18 ENCOUNTER — Ambulatory Visit (INDEPENDENT_AMBULATORY_CARE_PROVIDER_SITE_OTHER): Payer: BC Managed Care – PPO | Admitting: Neurology

## 2013-01-18 ENCOUNTER — Encounter: Payer: Self-pay | Admitting: Neurology

## 2013-01-18 VITALS — BP 130/70 | HR 113 | Ht 70.0 in | Wt 395.0 lb

## 2013-01-18 DIAGNOSIS — I951 Orthostatic hypotension: Secondary | ICD-10-CM

## 2013-01-18 DIAGNOSIS — H47019 Ischemic optic neuropathy, unspecified eye: Secondary | ICD-10-CM

## 2013-01-18 DIAGNOSIS — H3400 Transient retinal artery occlusion, unspecified eye: Secondary | ICD-10-CM

## 2013-01-18 DIAGNOSIS — R51 Headache: Secondary | ICD-10-CM

## 2013-01-18 DIAGNOSIS — H47012 Ischemic optic neuropathy, left eye: Secondary | ICD-10-CM

## 2013-01-18 DIAGNOSIS — G453 Amaurosis fugax: Secondary | ICD-10-CM

## 2013-01-18 MED ORDER — TRAZODONE HCL 100 MG PO TABS: 100.0000 mg | ORAL_TABLET | Freq: Every day | ORAL | Status: DC

## 2013-01-18 NOTE — Progress Notes (Signed)
Reason for visit: Headache  Kristen [name] is an 47 y.o. female  History of present illness:  Kristen [name] is a 47 year old right-handed white female with a history of morbid obesity, diabetes, and headaches. The patient indicates that her headaches in general are doing fairly well. The patient was taken off of amitriptyline on her last visit as she was noted to have some orthostatic hypotension with standing. The patient has reported dizziness with standing, better when she sits down. The patient indicates that this problem continues off of amitriptyline. The patient however, goes on to indicate that she takes Flexeril on a daily basis, and she is on Cozaar. The patient has had one fall since last seen. The patient also reports some tinnitus bilaterally. The patient returns for an evaluation. The patient reports that she is not sleeping well off of the amitriptyline. The patient indicates that she is having intermittent episodes of blurring of vision involving the left eye.  Past Medical History  Diagnosis Date  . Diabetes mellitus   . Hypertension   . Migraines   . Obesity   . Asthma   . PCOS (polycystic ovarian syndrome)   . PCOS (polycystic ovarian syndrome)   . 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 Peripheral edema     Past Surgical History  Procedure Laterality Date  . Nasal sinus surgery    . Nasal sinus surgery  1990  . Shoulder surgery  2004    left  . Abdominal surgery      hernia repair  . Cholecystectomy  [id number]  . Foot surgery  06/2007  . Knee arthroscopy  09/2008    left  . Knee arthroscopy  april 2011    left    Family History  Problem Relation Age of Onset  . Hypertension Maternal Grandmother   . Diabetes Maternal Grandfather   . Cancer Mother   . Cancer Father   . Diabetes Sister     Social history:  reports that she has never smoked. She has never used smokeless tobacco. She reports that she does not drink alcohol or use illicit  drugs.  Allergies:  Allergies  Allergen Reactions  . Cafergot Other (See Comments)    Chest pain  . Glucophage (Metformin Hydrochloride)     Medications:  Current Outpatient Prescriptions on File Prior to Visit  Medication Sig Dispense Refill  . cyclobenzaprine (FLEXERIL) 5 MG tablet Take 5 mg by mouth 3 (three) times daily as needed.        Marland Kitchen HYDROcodone-acetaminophen (NORCO/VICODIN) 5-325 MG per tablet Take 1 tablet by mouth every 6 (six) hours as needed for pain.  15 tablet  0  . insulin glargine (LANTUS) 100 UNIT/ML injection Inject 30 Units into the skin at bedtime.       . Liraglutide (VICTOZA) 18 MG/3ML SOLN Inject 1.8 mg into the skin once.       . montelukast (SINGULAIR) 10 MG tablet Take 10 mg by mouth.        . pravastatin (PRAVACHOL) 40 MG tablet Take 40 mg by mouth daily.       No current facility-administered medications on file prior to visit.    ROS:  Out of a complete 14 system review of symptoms, the patient complains only of the following symptoms, and all other reviewed systems are negative.  Ringing in the ears Dizziness Insomnia  Blood pressure 130/70, pulse 113, height 5\' 10"  (1.778 m), weight 395  lb (179.171 kg).  Physical Exam  General: The patient is alert and cooperative at the time of the examination. The patient is morbidly obese.  Skin: There is 3+ edema in both lower extremities below the knees.   Neurologic Exam  Cranial nerves: Facial symmetry is present. Speech is normal, no aphasia or dysarthria is noted. Extraocular movements are full. The adducting eye however, is inferior to the abducting eye.  Visual fields are full.  Motor: The patient has good strength in all 4 extremities.  Coordination: The patient has good finger-nose-finger and heel-to-shin bilaterally.  Gait and station: The patient has a normal gait. Tandem gait is slightly unsteady. Romberg is negative. No drift is seen.  Reflexes: Deep tendon reflexes are symmetric,  but are depressed.   Assessment/Plan:  1. History of headaches  2. Morbid obesity  3. Diabetes  4. Orthostatic hypotension  5. Ischemic optic neuropathy, OS  The patient has continued to have some dizziness with standing. The patient will try to come off of the Flexeril, and if the dizziness persists, a reduction in her Cozaar may be needed. The patient will followup through this office in about 4 months. The patient continues to have some intermittent blurring involving the left eye, but the episodes are intermittent. The patient will need to be followed by an ophthalmologist to ensure that she is not developing glaucoma in the left eye.  Jill Alexanders MD 01/18/2013 3:28 PM  [facility] [address] [place], [place] [address]  Phone [phone] Fax [phone]

## 2013-02-05 DIAGNOSIS — Z0289 Encounter for other administrative examinations: Secondary | ICD-10-CM

## 2013-03-08 ENCOUNTER — Telehealth: Payer: Self-pay | Admitting: *Deleted

## 2013-03-08 NOTE — Telephone Encounter (Signed)
Pt called stating she took the provera 10 mg x 10 days and her cycle never stopped while taking Rx. She also is c/o breast tenderness, pt was told to call if bleeding never stopped. Call back # [phone]. Please advise

## 2013-03-08 NOTE — Telephone Encounter (Signed)
Telephone call, bleeding has slowed using 2-3 pads per day. Weight greater than 300. Will take 20 mg of Provera for 10 days. History of PCO S. with long periods of amenorrhea, had not been taking Provera, but was having a light cycle most months, [place]. Instructed to call if bleeding does not stop, endometrial biopsy if continued spotting. Currently without insurance, declined office visit, laid off from her job.

## 2013-05-20 ENCOUNTER — Telehealth: Payer: Self-pay | Admitting: Neurology

## 2013-05-20 NOTE — Telephone Encounter (Signed)
[employer] insurance left message for clarification on the patient's work restrictions.  Physical ability assessment and notes have already been sent.  I will call [employer] to see what they are requesting.

## 2013-05-21 NOTE — Telephone Encounter (Signed)
I spoke to [facility] and told them the doctor had filled out the Physical Ability Assessment for already.  She thought that should be sufficient but will check with rep taking care of patient's case and let us know if more is needed.

## 2013-08-05 ENCOUNTER — Other Ambulatory Visit: Payer: Self-pay | Admitting: Obstetrics and Gynecology

## 2013-08-05 DIAGNOSIS — Z1231 Encounter for screening mammogram for malignant neoplasm of breast: Secondary | ICD-10-CM

## 2013-08-31 ENCOUNTER — Ambulatory Visit (HOSPITAL_COMMUNITY): Payer: BC Managed Care – PPO

## 2013-10-05 ENCOUNTER — Encounter (HOSPITAL_COMMUNITY): Payer: Self-pay

## 2013-10-05 ENCOUNTER — Ambulatory Visit (HOSPITAL_COMMUNITY)
Admission: RE | Admit: 2013-10-05 | Discharge: 2013-10-05 | Disposition: A | Discharge status: Home / Self Care | Payer: Self-pay | Source: Ambulatory Visit | Attending: Obstetrics and Gynecology | Admitting: Obstetrics and Gynecology

## 2013-10-05 ENCOUNTER — Ambulatory Visit (HOSPITAL_COMMUNITY)
Admission: RE | Admit: 2013-10-05 | Discharge: 2013-10-05 | Disposition: A | Discharge status: Home / Self Care | Payer: BC Managed Care – PPO | Source: Ambulatory Visit | Attending: Obstetrics and Gynecology | Admitting: Obstetrics and Gynecology

## 2013-10-05 VITALS — BP 122/80 | Temp 97.8°F | Ht 68.0 in | Wt 398.0 lb

## 2013-10-05 DIAGNOSIS — Z1231 Encounter for screening mammogram for malignant neoplasm of breast: Secondary | ICD-10-CM

## 2013-10-05 DIAGNOSIS — Z1239 Encounter for other screening for malignant neoplasm of breast: Secondary | ICD-10-CM

## 2013-10-05 NOTE — Patient Instructions (Addendum)
Taught Kristen [name] how to perform BSE. Patient did not need a Pap smear today due to last Pap smear was 05/24/2011. Let her know [facility] will cover Pap smears every 3 years unless has a history of abnormal Pap smears. Patient's next Pap smear is due August 2015. Told patient can have done free through [facility] and to call Gabriel Cirri a month prior to schedule. Let patient know will follow up with her within the next couple weeks with results by letter or phone. [name] verbalized understanding. Patient escorted to mammography for a screening mammogram.  Kelvyn Schunk, Arvil Chaco, RN 3:43 PM

## 2013-10-05 NOTE — Progress Notes (Signed)
No complaints today.  Pap Smear:  Pap smear not completed today. Last Pap smear was 05/24/2011 and normal. Per patient no history of an abnormal Pap smear. Last Pap smear result is in EPIC.  Physical exam: Breasts Breasts symmetrical. No skin abnormalities bilateral breasts. No nipple retraction bilateral breasts. No nipple discharge bilateral breasts. No lymphadenopathy. No lumps palpated bilateral breasts. No complaints of pain or tenderness on exam. Patient escorted to mammography for a screening mammogram.        Pelvic/Bimanual No Pap smear completed today since last Pap smear was 05/24/2011. Pap smear not indicated per [facility] guidelines.

## 2013-10-25 ENCOUNTER — Other Ambulatory Visit: Payer: Self-pay | Admitting: *Deleted

## 2014-02-09 DIAGNOSIS — Z0289 Encounter for other administrative examinations: Secondary | ICD-10-CM

## 2014-09-21 ENCOUNTER — Other Ambulatory Visit (HOSPITAL_COMMUNITY): Payer: Self-pay | Admitting: Family Medicine

## 2014-09-21 ENCOUNTER — Other Ambulatory Visit: Payer: Self-pay | Admitting: Women's Health

## 2014-09-21 DIAGNOSIS — Z1231 Encounter for screening mammogram for malignant neoplasm of breast: Secondary | ICD-10-CM

## 2014-10-07 ENCOUNTER — Ambulatory Visit (HOSPITAL_COMMUNITY)
Admission: RE | Admit: 2014-10-07 | Discharge: 2014-10-07 | Disposition: A | Discharge status: Home / Self Care | Payer: Medicaid Other | Source: Ambulatory Visit | Attending: Family Medicine | Admitting: Family Medicine

## 2014-10-07 DIAGNOSIS — Z1231 Encounter for screening mammogram for malignant neoplasm of breast: Secondary | ICD-10-CM

## 2014-12-13 ENCOUNTER — Ambulatory Visit: Payer: Self-pay | Admitting: Women's Health

## 2014-12-16 ENCOUNTER — Ambulatory Visit (INDEPENDENT_AMBULATORY_CARE_PROVIDER_SITE_OTHER): Payer: Medicaid Other | Admitting: Women's Health

## 2014-12-16 ENCOUNTER — Other Ambulatory Visit (HOSPITAL_COMMUNITY)
Admission: RE | Admit: 2014-12-16 | Discharge: 2014-12-16 | Disposition: A | Discharge status: Home / Self Care | Payer: Medicaid Other | Source: Ambulatory Visit | Attending: Gynecology | Admitting: Gynecology

## 2014-12-16 ENCOUNTER — Encounter: Payer: Self-pay | Admitting: Women's Health

## 2014-12-16 VITALS — BP 112/76 | Ht 67.0 in | Wt 384.0 lb

## 2014-12-16 DIAGNOSIS — B3731 Acute candidiasis of vulva and vagina: Secondary | ICD-10-CM

## 2014-12-16 DIAGNOSIS — Z01411 Encounter for gynecological examination (general) (routine) with abnormal findings: Secondary | ICD-10-CM | POA: Diagnosis present

## 2014-12-16 DIAGNOSIS — B373 Candidiasis of vulva and vagina: Secondary | ICD-10-CM

## 2014-12-16 DIAGNOSIS — Z1151 Encounter for screening for human papillomavirus (HPV): Secondary | ICD-10-CM | POA: Diagnosis present

## 2014-12-16 DIAGNOSIS — Z124 Encounter for screening for malignant neoplasm of cervix: Secondary | ICD-10-CM

## 2014-12-16 MED ORDER — FLUCONAZOLE 150 MG PO TABS: 150.0000 mg | ORAL_TABLET | Freq: Once | ORAL | Status: DC
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
## 2014-12-16 NOTE — Patient Instructions (Signed)

## 2014-12-20 LAB — CYTOLOGY - PAP

## 2015-01-12 ENCOUNTER — Ambulatory Visit
Admission: RE | Admit: 2015-01-12 | Discharge: 2015-01-12 | Disposition: A | Discharge status: Home / Self Care | Payer: Medicaid Other | Source: Ambulatory Visit | Attending: Family Medicine | Admitting: Family Medicine

## 2015-01-12 ENCOUNTER — Other Ambulatory Visit: Payer: Self-pay | Admitting: Family Medicine

## 2015-01-12 DIAGNOSIS — R0781 Pleurodynia: Secondary | ICD-10-CM

## 2015-06-14 ENCOUNTER — Ambulatory Visit (INDEPENDENT_AMBULATORY_CARE_PROVIDER_SITE_OTHER): Payer: Medicaid Other | Admitting: Neurology

## 2015-06-14 ENCOUNTER — Encounter: Payer: Self-pay | Admitting: Neurology

## 2015-06-14 VITALS — BP 124/86 | HR 95 | Resp 20 | Ht 68.0 in | Wt 394.0 lb

## 2015-06-14 DIAGNOSIS — I951 Orthostatic hypotension: Secondary | ICD-10-CM

## 2015-06-14 DIAGNOSIS — R42 Dizziness and giddiness: Secondary | ICD-10-CM | POA: Diagnosis not present

## 2015-06-14 DIAGNOSIS — G894 Chronic pain syndrome: Secondary | ICD-10-CM

## 2015-06-14 DIAGNOSIS — E119 Type 2 diabetes mellitus without complications: Secondary | ICD-10-CM | POA: Diagnosis not present

## 2015-06-14 DIAGNOSIS — E785 Hyperlipidemia, unspecified: Secondary | ICD-10-CM

## 2015-06-14 NOTE — Patient Instructions (Addendum)
1. Schedule open MRI brain without contrast 2. Schedule carotid dopplers 3. Routine EEG 4. As per [place] driving laws, if one loses consciousness, one should not drive until 6 months event-free 5. Follow-up in 2 months  6. YOU HAVE BEEN SCHEDULED AT [facility] FOR MRI BRAIN ON 06/22/2015. PLEASE ARRIVE @ 11:00.     [address] [place], [place] [address]  [phone]
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
## 2015-06-15 DIAGNOSIS — G894 Chronic pain syndrome: Secondary | ICD-10-CM | POA: Insufficient documentation

## 2015-06-15 DIAGNOSIS — E119 Type 2 diabetes mellitus without complications: Secondary | ICD-10-CM | POA: Insufficient documentation

## 2015-06-15 DIAGNOSIS — R42 Dizziness and giddiness: Secondary | ICD-10-CM | POA: Insufficient documentation

## 2015-06-15 DIAGNOSIS — E785 Hyperlipidemia, unspecified: Secondary | ICD-10-CM | POA: Insufficient documentation

## 2015-06-15 DIAGNOSIS — N183 Chronic kidney disease, stage 3 unspecified: Secondary | ICD-10-CM | POA: Insufficient documentation

## 2015-06-15 DIAGNOSIS — E1122 Type 2 diabetes mellitus with diabetic chronic kidney disease: Secondary | ICD-10-CM | POA: Insufficient documentation

## 2015-06-21 ENCOUNTER — Ambulatory Visit (HOSPITAL_COMMUNITY)
Admission: RE | Admit: 2015-06-21 | Discharge: 2015-06-21 | Disposition: A | Discharge status: Home / Self Care | Payer: Medicaid Other | Source: Ambulatory Visit | Attending: Neurology | Admitting: Neurology

## 2015-06-21 DIAGNOSIS — R42 Dizziness and giddiness: Secondary | ICD-10-CM | POA: Diagnosis present

## 2015-06-21 NOTE — Progress Notes (Signed)
Preliminary results by tech - Carotid Duplex Completed. No evidence of a significant stenosis in bilateral ICAs - 1-39%. Bilateral vertebral arteries demonstrate normal antegrade flow.  Oda Cogan, BS, RDMS, RVT

## 2015-06-22 ENCOUNTER — Telehealth: Payer: Self-pay | Admitting: Family Medicine

## 2015-06-22 ENCOUNTER — Other Ambulatory Visit: Payer: Medicaid Other

## 2015-06-22 NOTE — Telephone Encounter (Signed)
Patient notified of results. Report sent to her pcp.

## 2015-06-22 NOTE — Telephone Encounter (Signed)
-----   Message from Cameron Sprang, MD sent at 06/21/2015  4:20 PM EDT ----- Pls let her know blood vessels in neck do not show any obstruction. Thanks

## 2015-06-23 ENCOUNTER — Telehealth: Payer: Self-pay | Admitting: *Deleted

## 2015-06-23 NOTE — Telephone Encounter (Signed)
Patient notified of MRI results.  

## 2015-06-23 NOTE — Telephone Encounter (Signed)
-----   Message from Cameron Sprang, MD sent at 06/23/2015  8:24 AM EDT ----- Regarding: MRI results Pls let her know I reviewed MRI brain, it is unremarkable, no evidence of tumor, stroke, or bleed. It shows age-related changes. Thanks

## 2015-06-28 ENCOUNTER — Ambulatory Visit (INDEPENDENT_AMBULATORY_CARE_PROVIDER_SITE_OTHER): Payer: Medicaid Other | Admitting: Neurology

## 2015-06-28 DIAGNOSIS — R42 Dizziness and giddiness: Secondary | ICD-10-CM

## 2015-06-30 ENCOUNTER — Telehealth: Payer: Self-pay | Admitting: Family Medicine

## 2015-06-30 NOTE — Procedures (Signed)
ELECTROENCEPHALOGRAM REPORT  Date of Study: 06/28/2015  Patient's Name: Kristen [name] MRN: [id number] Date of Birth: 04/15/1966  Referring Provider: Dr. Ellouise Newer  Clinical History: This is a 49 year old woman with recurrent dizziness. EEG for classification  Medications: ASA, Flexeril, Byetta, Advair, Amaryl, Lantus, Singulair, Zoloft, Demadex  Technical Summary: A multichannel digital EEG recording measured by the international 10-20 system with electrodes applied with paste and impedances below 5000 ohms performed in our laboratory with EKG monitoring in an awake and asleep patient.  Hyperventilation and photic stimulation were performed.  The digital EEG was referentially recorded, reformatted, and digitally filtered in a variety of bipolar and referential montages for optimal display.    Description: The patient is awake and asleep during the recording.  During maximal wakefulness, there is a symmetric, medium voltage 9-9.5 Hz posterior dominant rhythm that attenuates with eye opening.  The record is symmetric.  During drowsiness and sleep, there is an increase in theta slowing of the background, at times sharply contoured over the left temporal region without clear epileptogenic potential.  Vertex waves and symmetric sleep spindles were seen.  Hyperventilation and photic stimulation did not elicit any abnormalities.  There were no epileptiform discharges or electrographic seizures seen.    EKG lead was unremarkable.  Impression: This awake and asleep EEG is normal.    Clinical Correlation: A normal EEG does not exclude a clinical diagnosis of epilepsy.  If further clinical questions remain, prolonged EEG may be helpful.  Clinical correlation is advised.   Ellouise Newer, M.D.

## 2015-06-30 NOTE — Telephone Encounter (Signed)
I notified patient of advisement. She wanted to know if Medicaid covered compression stockings. I did tell her I wasn't sure about that, but I did advise her to call Medicaid to find out if that was something they covered. She is going to call them on Monday to find out. If they do cover the stockings she will call me and we will give her an Rx for them.

## 2015-06-30 NOTE — Telephone Encounter (Signed)
-----   Message from Cameron Sprang, MD sent at 06/30/2015 10:07 AM EDT ----- Pls let her know brain wave test is normal, thanks

## 2015-06-30 NOTE — Telephone Encounter (Signed)
The dizziness she is having is due to her BP falling when she is upright. Would start wearing elastic compression stockings daily to keep pressures up. If she does not want to wear stockings, would use abdominal binder instead for same reasons.

## 2015-06-30 NOTE — Telephone Encounter (Signed)
Patient notified of result. She states she's still having episodes daily, wants to know if there is something else to be done.

## 2015-07-03 ENCOUNTER — Encounter: Payer: Self-pay | Admitting: Neurology

## 2015-08-18 ENCOUNTER — Ambulatory Visit: Payer: Medicaid Other | Admitting: Neurology

## 2015-10-04 ENCOUNTER — Other Ambulatory Visit: Payer: Self-pay

## 2015-10-04 DIAGNOSIS — Z1231 Encounter for screening mammogram for malignant neoplasm of breast: Secondary | ICD-10-CM

## 2015-11-02 ENCOUNTER — Ambulatory Visit: Payer: Medicaid Other

## 2015-11-30 ENCOUNTER — Ambulatory Visit (HOSPITAL_BASED_OUTPATIENT_CLINIC_OR_DEPARTMENT_OTHER): Payer: Medicaid Other | Attending: Anesthesiology | Admitting: *Deleted

## 2015-11-30 VITALS — Ht 68.0 in | Wt 394.0 lb

## 2015-11-30 DIAGNOSIS — G4733 Obstructive sleep apnea (adult) (pediatric): Secondary | ICD-10-CM | POA: Insufficient documentation

## 2015-11-30 DIAGNOSIS — Z79899 Other long term (current) drug therapy: Secondary | ICD-10-CM | POA: Insufficient documentation

## 2015-11-30 DIAGNOSIS — R0683 Snoring: Secondary | ICD-10-CM | POA: Insufficient documentation

## 2015-11-30 DIAGNOSIS — R5383 Other fatigue: Secondary | ICD-10-CM | POA: Diagnosis not present

## 2015-11-30 DIAGNOSIS — Z794 Long term (current) use of insulin: Secondary | ICD-10-CM | POA: Diagnosis not present

## 2015-12-02 DIAGNOSIS — G4733 Obstructive sleep apnea (adult) (pediatric): Secondary | ICD-10-CM | POA: Diagnosis not present

## 2015-12-02 NOTE — Progress Notes (Signed)
Patient Name: Kristen [name], Delossantos Study Date: 11/30/2015 Gender: Female D.O.B: 1966-06-02 Age (years): 49 Referring Provider: Brandy Hale Height (inches): 68 Interpreting Physician: Baird Lyons MD, ABSM Weight (lbs): 394 RPSGT: Gerhard Perches BMI: 60 MRN: [id number] Neck Size: 17.00 CLINICAL INFORMATION Sleep Study Type: Split Night CPAP Indication for sleep study: Fatigue, Snoring Epworth Sleepiness Score: 12  SLEEP STUDY TECHNIQUE As per the AASM Manual for the Scoring of Sleep and Associated Events v2.3 (April 2016) with a hypopnea requiring 4% desaturations. The channels recorded and monitored were frontal, central and occipital EEG, electrooculogram (EOG), submentalis EMG (chin), nasal and oral airflow, thoracic and abdominal wall motion, anterior tibialis EMG, snore microphone, electrocardiogram, and pulse oximetry. Continuous positive airway pressure (CPAP) was initiated when the patient met split night criteria and was titrated according to treat sleep-disordered breathing.  MEDICATIONS Medications taken by the patient : charted for review Medications administered by patient during sleep study :  LANTUS, OXYCODONE  RESPIRATORY PARAMETERS Diagnostic Total AHI (/hr): 21.3 RDI (/hr): 21.3 OA Index (/hr): - CA Index (/hr): 0.0 REM AHI (/hr): N/A NREM AHI (/hr): 21.3 Supine AHI (/hr): 17.9 Non-supine AHI (/hr): 22.86 Min O2 Sat (%): 88.00 Mean O2 (%): 93.46 Time below 88% (min): 0.3   Titration Optimal Pressure (cm): 10 AHI at Optimal Pressure (/hr): N/A Min O2 at Optimal Pressure (%): 88.00 Supine % at Optimal (%): N/A Sleep % at Optimal (%): N/A    SLEEP ARCHITECTURE The recording time for the entire night was 388.3 minutes. During a baseline period of 217.9 minutes, the patient slept for 182.9 minutes in REM and nonREM, yielding a sleep efficiency of 84.0%. Sleep onset after lights out was 16.0 minutes with a REM latency of N/A minutes. The patient spent 10.66% of  the night in stage N1 sleep, 85.24% in stage N2 sleep, 4.10% in stage N3 and 0.00% in REM. During the titration period of 167.4 minutes, the patient slept for 106.5 minutes in REM and nonREM, yielding a sleep efficiency of 63.6%. Sleep onset after CPAP initiation was 11.1 minutes with a REM latency of 8.5 minutes. The patient spent 15.02% of the night in stage N1 sleep, 67.61% in stage N2 sleep, 0.00% in stage N3 and 17.37% in REM.  CARDIAC DATA The 2 lead EKG demonstrated sinus rhythm. The mean heart rate was 76.21 beats per minute. Other EKG findings include: None.  LEG MOVEMENT DATA The total Periodic Limb Movements of Sleep (PLMS) were 0. The PLMS index was 0.00 .  IMPRESSIONS - Moderate obstructive sleep apnea occurred during the diagnostic portion of the study(AHI = 21.3/hour). An optimal PAP pressure was selected for this patient ( 10 cm of water) - No significant central sleep apnea occurred during the diagnostic portion of the study (CAI = 0.0/hour). - Mild oxygen desaturation was noted during the diagnostic portion of the study (Min O2 = 88.00%). - The patient snored with Moderate snoring volume during the diagnostic portion of the study. - No cardiac abnormalities were noted during this study. - Clinically significant periodic limb movements did not occur during sleep.  DIAGNOSIS - Obstructive Sleep Apnea (327.23 [G47.33 ICD-10])  RECOMMENDATIONS - Trial of CPAP therapy on 10 cm H2O with a Medium size Resmed Full Face Mask AirFit F10 mask and heated humidification. - Avoid alcohol, sedatives and other CNS depressants that may worsen sleep apnea and disrupt normal sleep architecture. - Sleep hygiene should be reviewed to assess factors that may improve sleep quality. - Weight management and regular exercise should  be initiated or continued.  Deneise Lever Diplomate, American Board of Sleep Medicine  ELECTRONICALLY SIGNED ON:  12/02/2015, 12:53 PM [place] PH: [phone]   FX: [phone] [place]

## 2015-12-19 ENCOUNTER — Encounter: Payer: Medicaid Other | Admitting: Women's Health

## 2015-12-28 ENCOUNTER — Encounter: Payer: Self-pay | Admitting: Women's Health

## 2015-12-28 ENCOUNTER — Ambulatory Visit (INDEPENDENT_AMBULATORY_CARE_PROVIDER_SITE_OTHER): Payer: Medicaid Other | Admitting: Women's Health

## 2015-12-28 VITALS — BP 106/74 | Ht 67.0 in | Wt 395.0 lb

## 2015-12-28 DIAGNOSIS — Z01419 Encounter for gynecological examination (general) (routine) without abnormal findings: Secondary | ICD-10-CM

## 2015-12-28 DIAGNOSIS — Z0000 Encounter for general adult medical examination without abnormal findings: Secondary | ICD-10-CM

## 2015-12-28 DIAGNOSIS — B373 Candidiasis of vulva and vagina: Secondary | ICD-10-CM

## 2015-12-28 DIAGNOSIS — B3731 Acute candidiasis of vulva and vagina: Secondary | ICD-10-CM

## 2015-12-28 LAB — WET PREP FOR TRICH, YEAST, CLUE
Clue Cells Wet Prep HPF POC: NONE SEEN
Trich, Wet Prep: NONE SEEN
WBC, Wet Prep HPF POC: NONE SEEN
Yeast Wet Prep HPF POC: NONE SEEN

## 2015-12-28 MED ORDER — NYSTATIN 100000 UNIT/GM EX CREA: 1.0000 "application " | TOPICAL_CREAM | Freq: Two times a day (BID) | CUTANEOUS | Status: DC

## 2015-12-28 NOTE — Addendum Note (Signed)
Addended by: Thurnell Garbe A on: 12/28/2015 02:23 PM   Modules accepted: Orders
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
## 2015-12-28 NOTE — Patient Instructions (Signed)

## 2016-01-29 DIAGNOSIS — I8310 Varicose veins of unspecified lower extremity with inflammation: Secondary | ICD-10-CM | POA: Diagnosis not present

## 2016-01-29 DIAGNOSIS — J329 Chronic sinusitis, unspecified: Secondary | ICD-10-CM | POA: Diagnosis not present

## 2016-02-13 DIAGNOSIS — Z79891 Long term (current) use of opiate analgesic: Secondary | ICD-10-CM | POA: Diagnosis not present

## 2016-02-13 DIAGNOSIS — G894 Chronic pain syndrome: Secondary | ICD-10-CM | POA: Diagnosis not present

## 2016-02-13 DIAGNOSIS — G4733 Obstructive sleep apnea (adult) (pediatric): Secondary | ICD-10-CM | POA: Diagnosis not present

## 2016-02-13 DIAGNOSIS — M25562 Pain in left knee: Secondary | ICD-10-CM | POA: Diagnosis not present

## 2016-02-13 DIAGNOSIS — M545 Low back pain: Secondary | ICD-10-CM | POA: Diagnosis not present

## 2016-02-13 DIAGNOSIS — M25561 Pain in right knee: Secondary | ICD-10-CM | POA: Diagnosis not present

## 2016-02-23 ENCOUNTER — Ambulatory Visit: Payer: Medicaid Other

## 2016-03-06 DIAGNOSIS — R609 Edema, unspecified: Secondary | ICD-10-CM | POA: Diagnosis not present

## 2016-03-06 DIAGNOSIS — N181 Chronic kidney disease, stage 1: Secondary | ICD-10-CM | POA: Diagnosis not present

## 2016-03-06 DIAGNOSIS — Z6841 Body Mass Index (BMI) 40.0 and over, adult: Secondary | ICD-10-CM | POA: Diagnosis not present

## 2016-03-06 DIAGNOSIS — Z794 Long term (current) use of insulin: Secondary | ICD-10-CM | POA: Diagnosis not present

## 2016-03-06 DIAGNOSIS — E1122 Type 2 diabetes mellitus with diabetic chronic kidney disease: Secondary | ICD-10-CM | POA: Diagnosis not present

## 2016-03-12 DIAGNOSIS — M2550 Pain in unspecified joint: Secondary | ICD-10-CM | POA: Diagnosis not present

## 2016-03-12 DIAGNOSIS — M545 Low back pain: Secondary | ICD-10-CM | POA: Diagnosis not present

## 2016-03-12 DIAGNOSIS — Z6841 Body Mass Index (BMI) 40.0 and over, adult: Secondary | ICD-10-CM | POA: Diagnosis not present

## 2016-03-12 DIAGNOSIS — Z1211 Encounter for screening for malignant neoplasm of colon: Secondary | ICD-10-CM | POA: Diagnosis not present

## 2016-03-14 DIAGNOSIS — Z79891 Long term (current) use of opiate analgesic: Secondary | ICD-10-CM | POA: Diagnosis not present

## 2016-03-15 DIAGNOSIS — M5416 Radiculopathy, lumbar region: Secondary | ICD-10-CM | POA: Diagnosis not present

## 2016-03-15 DIAGNOSIS — M25552 Pain in left hip: Secondary | ICD-10-CM | POA: Diagnosis not present

## 2016-03-15 DIAGNOSIS — M25562 Pain in left knee: Secondary | ICD-10-CM | POA: Diagnosis not present

## 2016-03-15 DIAGNOSIS — M25551 Pain in right hip: Secondary | ICD-10-CM | POA: Diagnosis not present

## 2016-03-15 DIAGNOSIS — M545 Low back pain: Secondary | ICD-10-CM | POA: Diagnosis not present

## 2016-03-15 DIAGNOSIS — G894 Chronic pain syndrome: Secondary | ICD-10-CM | POA: Diagnosis not present

## 2016-03-15 DIAGNOSIS — M25572 Pain in left ankle and joints of left foot: Secondary | ICD-10-CM | POA: Diagnosis not present

## 2016-03-15 DIAGNOSIS — M25561 Pain in right knee: Secondary | ICD-10-CM | POA: Diagnosis not present

## 2016-03-19 ENCOUNTER — Ambulatory Visit
Admission: RE | Admit: 2016-03-19 | Discharge: 2016-03-19 | Disposition: A | Discharge status: Home / Self Care | Payer: Medicaid Other | Source: Ambulatory Visit

## 2016-03-19 DIAGNOSIS — Z1231 Encounter for screening mammogram for malignant neoplasm of breast: Secondary | ICD-10-CM | POA: Diagnosis not present

## 2016-03-27 DIAGNOSIS — G4733 Obstructive sleep apnea (adult) (pediatric): Secondary | ICD-10-CM | POA: Diagnosis not present

## 2016-03-29 ENCOUNTER — Encounter: Payer: Self-pay | Admitting: Internal Medicine

## 2016-03-29 ENCOUNTER — Ambulatory Visit (INDEPENDENT_AMBULATORY_CARE_PROVIDER_SITE_OTHER): Payer: Commercial Managed Care - HMO | Admitting: Internal Medicine

## 2016-03-29 VITALS — BP 110/70 | HR 85 | Ht 67.0 in | Wt 389.4 lb

## 2016-03-29 DIAGNOSIS — G453 Amaurosis fugax: Secondary | ICD-10-CM | POA: Diagnosis not present

## 2016-03-29 DIAGNOSIS — I951 Orthostatic hypotension: Secondary | ICD-10-CM | POA: Diagnosis not present

## 2016-03-29 DIAGNOSIS — E785 Hyperlipidemia, unspecified: Secondary | ICD-10-CM | POA: Diagnosis not present

## 2016-03-29 DIAGNOSIS — R079 Chest pain, unspecified: Secondary | ICD-10-CM

## 2016-03-29 NOTE — Progress Notes (Signed)
Cardiology Office Note   Date:  03/29/2016   ID:  Kristen [name], DOB [birth year], MRN [id number]  PCP:   Melinda Crutch, MD  Cardiologist:   Dorris Carnes, MD   Pt referred by Myriam Jacobson for eval of cardiac risk factors     History of Present Illness: Kristen [name] is a 50 y.o. female with a history of HTN, DM, HL Bronchitis  Followed by Dr Jerl Santos to [facility]  She was told, by autonomic testing, th she was at risk of massive heart attack  Feels like has severe case of indigestion  Stuck in throat  Does get SOB with activity  No CP with activity    A1C 10  LDL was 81  HDL 52     Outpatient Prescriptions Prior to Visit  Medication Sig Dispense Refill  . aspirin 81 MG tablet Take 81 mg by mouth daily.    Marland Kitchen atorvastatin (LIPITOR) 40 MG tablet Take 40 mg by mouth daily.    . cyclobenzaprine (FLEXERIL) 5 MG tablet Take 5 mg by mouth 3 (three) times daily as needed.      Marland Kitchen exenatide (BYETTA) 5 MCG/0.02ML SOPN injection Inject 5 mcg into the skin 2 (two) times daily with a meal.    . Fluticasone-Salmeterol (ADVAIR) 500-50 MCG/DOSE AEPB Inhale 1 puff into the lungs every 12 (twelve) hours.    Marland Kitchen glimepiride (AMARYL) 4 MG tablet Take 4 mg by mouth daily before breakfast.    . insulin glargine (LANTUS) 100 UNIT/ML injection Inject 50 Units into the skin at bedtime.     . montelukast (SINGULAIR) 10 MG tablet Take 10 mg by mouth.      . nystatin cream (MYCOSTATIN) Apply 1 application topically 2 (two) times daily. 30 g 2  . oxyCODONE-acetaminophen (PERCOCET/ROXICET) 5-325 MG tablet Take by mouth every 4 (four) hours as needed for severe pain.    . promethazine (PHENERGAN) 25 MG tablet Take 25 mg by mouth every 6 (six) hours as needed.     . sertraline (ZOLOFT) 100 MG tablet Take 150 mg daily    . torsemide (DEMADEX) 20 MG tablet Take 20 mg by mouth daily.     No facility-administered medications prior to visit.     Allergies:   Canagliflozin; Cafergot; and Glucophage   Past  Medical History  Diagnosis Date  . Diabetes mellitus   . Hypertension   . Migraines   . Obesity   . Asthma   . PCOS (polycystic ovarian syndrome)   . 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 Peripheral edema   . Degenerative arthritis   . Near syncope     Past Surgical History  Procedure Laterality Date  . Nasal sinus surgery    . Nasal sinus surgery  1990  . Shoulder surgery  2004    left  . Abdominal surgery      hernia repair  . Cholecystectomy  [id number]  . Foot surgery  06/2007  . Knee arthroscopy  09/2008    left  . Knee arthroscopy  april 2011    left     Social History:  The patient  reports that she has never smoked. She has never used smokeless tobacco. She reports that she does not drink alcohol or use illicit drugs.   Family History:  The patient's family history includes Cancer in her father and mother; Diabetes in her maternal grandfather and sister; Hypertension  in her maternal grandmother; Stroke in her maternal grandmother. There is no history of Heart attack.    ROS:  Please see the history of present illness. All other systems are reviewed and  Negative to the above problem except as noted.    PHYSICAL EXAM: VS:  BP 110/70 mmHg  Pulse 85  Ht 5\' 7"  (1.702 m)  Wt 389 lb 6.4 oz (176.631 kg)  BMI 60.97 kg/m2  SpO2 96%  GEN: Morbidly obese 50 yo  , in no acute distress HEENT: normal Neck: no JVD, carotid bruits, or masses Cardiac: RRR; no murmurs, rubs, or gallops,Tr edema  Respiratory:  clear to auscultation bilaterally, normal work of breathing GI: soft, nontender, nondistended, + BS  No hepatomegaly  MS: no deformity Moving all extremities   Skin: warm and dry, Healing blisters on legs   Neuro:  Strength and sensation are intact Psych: euthymic mood, full affect   EKG:  EKG is ordered today.  SR 85 bpm     Lipid Panel No results found for: CHOL, TRIG, HDL, CHOLHDL, VLDL, LDLCALC, LDLDIRECT    Wt Readings from Last 3  Encounters:  03/29/16 389 lb 6.4 oz (176.631 kg)  12/28/15 395 lb (179.171 kg)  11/30/15 394 lb (178.717 kg)      ASSESSMENT AND PLAN:  1  Cardiac risk assessment.  Pt with multiple risks for CAD  Does get SOB with activity, poor exercise tolerance  ? If thisis anginal equivalent. Will set up for Lexiscan myoview to evaluate  2.  "INdigestion"  I am not convinced angina  Will set up for [facility] though and also Rx emprirically with Zantac  3.  HTN  BP is OK  Follow    4. HL  Continue lipitor  5.  HCM  Discussed diet  Needs to lose wt.    Signed, Dorris Carnes, MD  03/29/2016 4:47 PM    [name], [place], [place]  [address] Phone: [phone]; Fax: [phone]

## 2016-03-29 NOTE — Patient Instructions (Signed)
Medication Instructions:  The current medical regimen is effective;  continue present plan and medications.  Testing/Procedures: Your physician has requested that you have a lexiscan myoview. For further information please visit [email or web address]. Please follow instruction sheet, as given.  Follow-Up: Follow up as needed after testing.  If you need a refill on your cardiac medications before your next appointment, please call your pharmacy.  Thank you for choosing [facility]!!      

## 2016-04-01 ENCOUNTER — Other Ambulatory Visit: Payer: Self-pay | Admitting: *Deleted

## 2016-04-01 DIAGNOSIS — R0602 Shortness of breath: Secondary | ICD-10-CM

## 2016-04-01 DIAGNOSIS — R079 Chest pain, unspecified: Secondary | ICD-10-CM

## 2016-04-11 ENCOUNTER — Encounter (HOSPITAL_COMMUNITY)
Admission: RE | Admit: 2016-04-11 | Discharge: 2016-04-11 | Disposition: A | Discharge status: Home / Self Care | Payer: Commercial Managed Care - HMO | Source: Ambulatory Visit | Attending: Internal Medicine | Admitting: Internal Medicine

## 2016-04-11 DIAGNOSIS — R079 Chest pain, unspecified: Secondary | ICD-10-CM | POA: Diagnosis not present

## 2016-04-11 DIAGNOSIS — R0602 Shortness of breath: Secondary | ICD-10-CM | POA: Diagnosis not present

## 2016-04-11 MED ORDER — TECHNETIUM TC 99M TETROFOSMIN IV KIT
30.0000 | PACK | Freq: Once | INTRAVENOUS | Status: AC | PRN
  Administered 2016-04-11: 30 via INTRAVENOUS

## 2016-04-11 MED ORDER — REGADENOSON 0.4 MG/5ML IV SOLN
INTRAVENOUS | Status: AC
  Administered 2016-04-11: 0.4 mg via INTRAVENOUS
  Filled 2016-04-11: qty 5

## 2016-04-11 MED ORDER — REGADENOSON 0.4 MG/5ML IV SOLN
0.4000 mg | Freq: Once | INTRAVENOUS | Status: AC
  Administered 2016-04-11: 0.4 mg via INTRAVENOUS

## 2016-04-12 ENCOUNTER — Encounter (HOSPITAL_COMMUNITY)
Admission: RE | Admit: 2016-04-12 | Discharge: 2016-04-12 | Disposition: A | Discharge status: Home / Self Care | Payer: Commercial Managed Care - HMO | Source: Ambulatory Visit | Attending: Internal Medicine | Admitting: Internal Medicine

## 2016-04-12 DIAGNOSIS — H52221 Regular astigmatism, right eye: Secondary | ICD-10-CM | POA: Diagnosis not present

## 2016-04-12 DIAGNOSIS — E119 Type 2 diabetes mellitus without complications: Secondary | ICD-10-CM | POA: Diagnosis not present

## 2016-04-12 DIAGNOSIS — H524 Presbyopia: Secondary | ICD-10-CM | POA: Diagnosis not present

## 2016-04-12 DIAGNOSIS — Z7984 Long term (current) use of oral hypoglycemic drugs: Secondary | ICD-10-CM | POA: Diagnosis not present

## 2016-04-12 LAB — NM MYOCAR MULTI W/SPECT W/WALL MOTION / EF
LV dias vol: 137 mL (ref 46–106)
LV sys vol: 60 mL
Peak HR: 88 {beats}/min
RATE: 0.35
Rest HR: 77 {beats}/min
SDS: 1
SRS: 2
SSS: 3
TID: 1.15

## 2016-04-12 MED ORDER — TECHNETIUM TC 99M TETROFOSMIN IV KIT
30.0000 | PACK | Freq: Once | INTRAVENOUS | Status: AC | PRN
  Administered 2016-04-12: 30 via INTRAVENOUS

## 2016-04-16 ENCOUNTER — Telehealth: Payer: Self-pay | Admitting: Internal Medicine

## 2016-04-16 NOTE — Telephone Encounter (Signed)
Informed patient of results and verbal understanding expressed.   Copy of results placed in mail per patient request.

## 2016-04-16 NOTE — Telephone Encounter (Signed)
New Message  Pt calling to speak w/ rN nuc stress results. Please call back and discuss.

## 2016-04-16 NOTE — Telephone Encounter (Signed)
-----   Message from Fay Records, MD sent at 04/12/2016  3:41 PM EDT ----- Stress test is normal  NO evidence of blood flow problems to heart  Pumping function of heart is normal No new recommendations.

## 2016-04-26 DIAGNOSIS — G4733 Obstructive sleep apnea (adult) (pediatric): Secondary | ICD-10-CM | POA: Diagnosis not present

## 2016-05-02 DIAGNOSIS — M150 Primary generalized (osteo)arthritis: Secondary | ICD-10-CM | POA: Diagnosis not present

## 2016-05-02 DIAGNOSIS — M79641 Pain in right hand: Secondary | ICD-10-CM | POA: Diagnosis not present

## 2016-05-02 DIAGNOSIS — M545 Low back pain: Secondary | ICD-10-CM | POA: Diagnosis not present

## 2016-05-02 DIAGNOSIS — M79642 Pain in left hand: Secondary | ICD-10-CM | POA: Diagnosis not present

## 2016-05-02 DIAGNOSIS — M2550 Pain in unspecified joint: Secondary | ICD-10-CM | POA: Diagnosis not present

## 2016-05-02 DIAGNOSIS — M25561 Pain in right knee: Secondary | ICD-10-CM | POA: Diagnosis not present

## 2016-05-02 DIAGNOSIS — M25562 Pain in left knee: Secondary | ICD-10-CM | POA: Diagnosis not present

## 2016-05-02 DIAGNOSIS — R5383 Other fatigue: Secondary | ICD-10-CM | POA: Diagnosis not present

## 2016-05-02 DIAGNOSIS — R700 Elevated erythrocyte sedimentation rate: Secondary | ICD-10-CM | POA: Diagnosis not present

## 2016-05-07 DIAGNOSIS — Z794 Long term (current) use of insulin: Secondary | ICD-10-CM | POA: Diagnosis not present

## 2016-05-07 DIAGNOSIS — N181 Chronic kidney disease, stage 1: Secondary | ICD-10-CM | POA: Diagnosis not present

## 2016-05-07 DIAGNOSIS — Z6841 Body Mass Index (BMI) 40.0 and over, adult: Secondary | ICD-10-CM | POA: Diagnosis not present

## 2016-05-07 DIAGNOSIS — R609 Edema, unspecified: Secondary | ICD-10-CM | POA: Diagnosis not present

## 2016-05-07 DIAGNOSIS — E1122 Type 2 diabetes mellitus with diabetic chronic kidney disease: Secondary | ICD-10-CM | POA: Diagnosis not present

## 2016-05-07 DIAGNOSIS — E1165 Type 2 diabetes mellitus with hyperglycemia: Secondary | ICD-10-CM | POA: Diagnosis not present

## 2016-05-27 DIAGNOSIS — G4733 Obstructive sleep apnea (adult) (pediatric): Secondary | ICD-10-CM | POA: Diagnosis not present

## 2016-05-29 DIAGNOSIS — M549 Dorsalgia, unspecified: Secondary | ICD-10-CM | POA: Diagnosis not present

## 2016-05-29 DIAGNOSIS — G4733 Obstructive sleep apnea (adult) (pediatric): Secondary | ICD-10-CM | POA: Diagnosis not present

## 2016-05-29 DIAGNOSIS — Z6841 Body Mass Index (BMI) 40.0 and over, adult: Secondary | ICD-10-CM | POA: Diagnosis not present

## 2016-05-29 DIAGNOSIS — J45991 Cough variant asthma: Secondary | ICD-10-CM | POA: Diagnosis not present

## 2016-05-29 DIAGNOSIS — R3915 Urgency of urination: Secondary | ICD-10-CM | POA: Diagnosis not present

## 2016-06-10 DIAGNOSIS — M150 Primary generalized (osteo)arthritis: Secondary | ICD-10-CM | POA: Diagnosis not present

## 2016-06-10 DIAGNOSIS — M545 Low back pain: Secondary | ICD-10-CM | POA: Diagnosis not present

## 2016-06-10 DIAGNOSIS — R748 Abnormal levels of other serum enzymes: Secondary | ICD-10-CM | POA: Diagnosis not present

## 2016-06-10 DIAGNOSIS — M2550 Pain in unspecified joint: Secondary | ICD-10-CM | POA: Diagnosis not present

## 2016-06-10 DIAGNOSIS — R7982 Elevated C-reactive protein (CRP): Secondary | ICD-10-CM | POA: Diagnosis not present

## 2016-06-11 DIAGNOSIS — E119 Type 2 diabetes mellitus without complications: Secondary | ICD-10-CM | POA: Diagnosis not present

## 2016-06-11 DIAGNOSIS — Z794 Long term (current) use of insulin: Secondary | ICD-10-CM | POA: Diagnosis not present

## 2016-06-11 DIAGNOSIS — Z1211 Encounter for screening for malignant neoplasm of colon: Secondary | ICD-10-CM | POA: Diagnosis not present

## 2016-06-17 DIAGNOSIS — G4733 Obstructive sleep apnea (adult) (pediatric): Secondary | ICD-10-CM | POA: Diagnosis not present

## 2016-06-20 DIAGNOSIS — G4733 Obstructive sleep apnea (adult) (pediatric): Secondary | ICD-10-CM | POA: Diagnosis not present

## 2016-06-27 DIAGNOSIS — G4733 Obstructive sleep apnea (adult) (pediatric): Secondary | ICD-10-CM | POA: Diagnosis not present

## 2016-06-29 DIAGNOSIS — G4733 Obstructive sleep apnea (adult) (pediatric): Secondary | ICD-10-CM | POA: Diagnosis not present

## 2016-07-01 ENCOUNTER — Encounter
Payer: Commercial Managed Care - HMO | Attending: Physical Medicine & Rehabilitation | Admitting: Physical Medicine & Rehabilitation

## 2016-07-01 ENCOUNTER — Encounter: Payer: Self-pay | Admitting: Physical Medicine & Rehabilitation

## 2016-07-01 VITALS — BP 117/85 | HR 88

## 2016-07-01 DIAGNOSIS — M545 Low back pain: Secondary | ICD-10-CM | POA: Insufficient documentation

## 2016-07-01 DIAGNOSIS — M47816 Spondylosis without myelopathy or radiculopathy, lumbar region: Secondary | ICD-10-CM | POA: Diagnosis not present

## 2016-07-01 DIAGNOSIS — E119 Type 2 diabetes mellitus without complications: Secondary | ICD-10-CM | POA: Diagnosis not present

## 2016-07-01 DIAGNOSIS — G8929 Other chronic pain: Secondary | ICD-10-CM | POA: Insufficient documentation

## 2016-07-01 DIAGNOSIS — I10 Essential (primary) hypertension: Secondary | ICD-10-CM | POA: Insufficient documentation

## 2016-07-01 DIAGNOSIS — Z6841 Body Mass Index (BMI) 40.0 and over, adult: Secondary | ICD-10-CM

## 2016-07-01 DIAGNOSIS — Z794 Long term (current) use of insulin: Secondary | ICD-10-CM | POA: Insufficient documentation

## 2016-07-01 DIAGNOSIS — Z79899 Other long term (current) drug therapy: Secondary | ICD-10-CM

## 2016-07-01 DIAGNOSIS — Z7982 Long term (current) use of aspirin: Secondary | ICD-10-CM | POA: Diagnosis not present

## 2016-07-01 DIAGNOSIS — E669 Obesity, unspecified: Secondary | ICD-10-CM | POA: Diagnosis not present

## 2016-07-01 DIAGNOSIS — Z5181 Encounter for therapeutic drug level monitoring: Secondary | ICD-10-CM

## 2016-07-01 MED ORDER — CYCLOBENZAPRINE HCL 10 MG PO TABS: 10.0000 mg | ORAL_TABLET | Freq: Three times a day (TID) | ORAL | 3 refills | Status: DC | PRN
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
## 2016-07-01 NOTE — Patient Instructions (Signed)
I NEED YOUR INJECTION RECORDS FROM DR. [facility] AND FROM [facility]

## 2016-07-07 LAB — TOXASSURE SELECT,+ANTIDEPR,UR

## 2016-07-09 NOTE — Progress Notes (Signed)
Urine drug screen for this encounter is consistent for prescribed medications.   

## 2016-07-18 ENCOUNTER — Telehealth: Payer: Self-pay | Admitting: Physical Medicine & Rehabilitation

## 2016-07-18 NOTE — Telephone Encounter (Signed)
Patients UDS was consistent. Can Kristen [name] write a script for patient to pick up.Marland KitchenMarland KitchenMarland KitchenMarland Kitchenplease advise

## 2016-07-18 NOTE — Telephone Encounter (Signed)
Patient had an appointment on 10/16 and we had to reschedule.  Patient would like to know if Dr. Naaman Plummer was going to prescribe her anything, he said he wouldn't until the urine drug screen came back.  Please follow up with patient on her cell phone number.

## 2016-07-19 MED ORDER — OXYCODONE HCL 5 MG PO TABS: 5.0000 mg | ORAL_TABLET | Freq: Four times a day (QID) | ORAL | 0 refills | Status: DC | PRN

## 2016-07-19 NOTE — Telephone Encounter (Signed)
Oxycodone 5mg  q6 prn #60

## 2016-07-22 NOTE — Telephone Encounter (Signed)
Prescription was picked up on 07/19/16

## 2016-07-26 ENCOUNTER — Encounter (HOSPITAL_COMMUNITY): Payer: Self-pay | Admitting: *Deleted

## 2016-07-26 DIAGNOSIS — R609 Edema, unspecified: Secondary | ICD-10-CM | POA: Diagnosis not present

## 2016-07-26 DIAGNOSIS — N181 Chronic kidney disease, stage 1: Secondary | ICD-10-CM | POA: Diagnosis not present

## 2016-07-26 DIAGNOSIS — E1165 Type 2 diabetes mellitus with hyperglycemia: Secondary | ICD-10-CM | POA: Diagnosis not present

## 2016-07-26 DIAGNOSIS — Z6841 Body Mass Index (BMI) 40.0 and over, adult: Secondary | ICD-10-CM | POA: Diagnosis not present

## 2016-07-26 DIAGNOSIS — E1122 Type 2 diabetes mellitus with diabetic chronic kidney disease: Secondary | ICD-10-CM | POA: Diagnosis not present

## 2016-07-26 DIAGNOSIS — Z23 Encounter for immunization: Secondary | ICD-10-CM | POA: Diagnosis not present

## 2016-07-26 DIAGNOSIS — Z794 Long term (current) use of insulin: Secondary | ICD-10-CM | POA: Diagnosis not present

## 2016-07-27 DIAGNOSIS — G4733 Obstructive sleep apnea (adult) (pediatric): Secondary | ICD-10-CM | POA: Diagnosis not present

## 2016-07-29 ENCOUNTER — Other Ambulatory Visit: Payer: Self-pay | Admitting: Gastroenterology

## 2016-07-29 DIAGNOSIS — G4733 Obstructive sleep apnea (adult) (pediatric): Secondary | ICD-10-CM | POA: Diagnosis not present

## 2016-07-30 ENCOUNTER — Encounter (HOSPITAL_COMMUNITY): Payer: Self-pay | Admitting: Anesthesiology

## 2016-07-30 NOTE — Anesthesia Preprocedure Evaluation (Addendum)
Anesthesia Evaluation  Patient identified by MRN, date of birth, ID band Patient awake    Reviewed: Allergy & Precautions, NPO status , Patient's Chart, lab work & pertinent test results  Airway Mallampati: I  TM Distance: >3 FB Neck ROM: Full    Dental  (+) Teeth Intact, Dental Advisory Given   Pulmonary asthma , sleep apnea and Continuous Positive Airway Pressure Ventilation ,    breath sounds clear to auscultation       Cardiovascular hypertension, Pt. on medications  Rhythm:Regular Rate:Normal     Neuro/Psych  Headaches, PSYCHIATRIC DISORDERS Anxiety Depression CVA    GI/Hepatic negative GI ROS, Neg liver ROS,   Endo/Other  diabetes, Type 2, Insulin Dependent  Renal/GU negative Renal ROS  negative genitourinary   Musculoskeletal  (+) Arthritis , Osteoarthritis,    Abdominal   Peds negative pediatric ROS (+)  Hematology negative hematology ROS (+)   Anesthesia Other Findings - PCOS  Reproductive/Obstetrics negative OB ROS                            Anesthesia Physical Anesthesia Plan  ASA: III  Anesthesia Plan: MAC   Post-op Pain Management:    Induction: Intravenous  Airway Management Planned: Natural Airway  Additional Equipment:   Intra-op Plan:   Post-operative Plan:   Informed Consent: I have reviewed the patients History and Physical, chart, labs and discussed the procedure including the risks, benefits and alternatives for the proposed anesthesia with the patient or authorized representative who has indicated his/her understanding and acceptance.     Plan Discussed with: CRNA  Anesthesia Plan Comments:        Anesthesia Quick Evaluation

## 2016-07-31 ENCOUNTER — Ambulatory Visit (HOSPITAL_COMMUNITY): Payer: Commercial Managed Care - HMO | Admitting: Anesthesiology

## 2016-07-31 ENCOUNTER — Ambulatory Visit (HOSPITAL_COMMUNITY)
Admission: RE | Admit: 2016-07-31 | Discharge: 2016-07-31 | Disposition: A | Discharge status: Home / Self Care | Payer: Commercial Managed Care - HMO | Source: Ambulatory Visit | Attending: Gastroenterology | Admitting: Gastroenterology

## 2016-07-31 ENCOUNTER — Encounter (HOSPITAL_COMMUNITY): Payer: Self-pay | Admitting: Anesthesiology

## 2016-07-31 ENCOUNTER — Encounter (HOSPITAL_COMMUNITY)
Admission: RE | Disposition: A | Discharge status: Home / Self Care | Payer: Self-pay | Source: Ambulatory Visit | Attending: Gastroenterology

## 2016-07-31 DIAGNOSIS — F329 Major depressive disorder, single episode, unspecified: Secondary | ICD-10-CM | POA: Insufficient documentation

## 2016-07-31 DIAGNOSIS — Z8673 Personal history of transient ischemic attack (TIA), and cerebral infarction without residual deficits: Secondary | ICD-10-CM | POA: Insufficient documentation

## 2016-07-31 DIAGNOSIS — J45909 Unspecified asthma, uncomplicated: Secondary | ICD-10-CM | POA: Diagnosis not present

## 2016-07-31 DIAGNOSIS — I10 Essential (primary) hypertension: Secondary | ICD-10-CM | POA: Insufficient documentation

## 2016-07-31 DIAGNOSIS — M1990 Unspecified osteoarthritis, unspecified site: Secondary | ICD-10-CM | POA: Insufficient documentation

## 2016-07-31 DIAGNOSIS — K644 Residual hemorrhoidal skin tags: Secondary | ICD-10-CM | POA: Insufficient documentation

## 2016-07-31 DIAGNOSIS — F419 Anxiety disorder, unspecified: Secondary | ICD-10-CM | POA: Diagnosis not present

## 2016-07-31 DIAGNOSIS — Z79899 Other long term (current) drug therapy: Secondary | ICD-10-CM | POA: Diagnosis not present

## 2016-07-31 DIAGNOSIS — K5730 Diverticulosis of large intestine without perforation or abscess without bleeding: Secondary | ICD-10-CM | POA: Diagnosis not present

## 2016-07-31 DIAGNOSIS — Z1211 Encounter for screening for malignant neoplasm of colon: Secondary | ICD-10-CM | POA: Diagnosis not present

## 2016-07-31 DIAGNOSIS — Z8371 Family history of colonic polyps: Secondary | ICD-10-CM | POA: Insufficient documentation

## 2016-07-31 DIAGNOSIS — Z794 Long term (current) use of insulin: Secondary | ICD-10-CM | POA: Diagnosis not present

## 2016-07-31 DIAGNOSIS — E119 Type 2 diabetes mellitus without complications: Secondary | ICD-10-CM | POA: Diagnosis not present

## 2016-07-31 DIAGNOSIS — G4730 Sleep apnea, unspecified: Secondary | ICD-10-CM | POA: Insufficient documentation

## 2016-07-31 HISTORY — DX: Depression, unspecified: F32.A

## 2016-07-31 HISTORY — DX: Major depressive disorder, single episode, unspecified: F32.9

## 2016-07-31 HISTORY — PX: COLONOSCOPY WITH PROPOFOL: SHX5780

## 2016-07-31 HISTORY — DX: Sleep apnea, unspecified: G47.30

## 2016-07-31 HISTORY — DX: Anxiety disorder, unspecified: F41.9

## 2016-07-31 LAB — GLUCOSE, CAPILLARY: Glucose-Capillary: 110 mg/dL — ABNORMAL HIGH (ref 65–99)

## 2016-07-31 SURGERY — COLONOSCOPY WITH PROPOFOL
Anesthesia: Monitor Anesthesia Care

## 2016-07-31 MED ORDER — PROPOFOL 10 MG/ML IV BOLUS
INTRAVENOUS | Status: DC | PRN
  Administered 2016-07-31: 40 mg via INTRAVENOUS

## 2016-07-31 MED ORDER — PHENYLEPHRINE 40 MCG/ML (10ML) SYRINGE FOR IV PUSH (FOR BLOOD PRESSURE SUPPORT)
PREFILLED_SYRINGE | INTRAVENOUS | Status: AC
  Filled 2016-07-31: qty 10

## 2016-07-31 MED ORDER — PROPOFOL 10 MG/ML IV BOLUS
INTRAVENOUS | Status: AC
  Filled 2016-07-31: qty 20

## 2016-07-31 MED ORDER — SODIUM CHLORIDE 0.9 % IV SOLN: INTRAVENOUS | Status: DC

## 2016-07-31 MED ORDER — LACTATED RINGERS IV SOLN
INTRAVENOUS | Status: DC
  Administered 2016-07-31: 1000 mL via INTRAVENOUS

## 2016-07-31 MED ORDER — PHENYLEPHRINE HCL 10 MG/ML IJ SOLN
INTRAMUSCULAR | Status: DC | PRN
Start: 2016-07-31 — End: 2016-07-31
  Administered 2016-07-31: 40 ug via INTRAVENOUS
  Administered 2016-07-31 (×2): 80 ug via INTRAVENOUS

## 2016-07-31 MED ORDER — PROPOFOL 500 MG/50ML IV EMUL
INTRAVENOUS | Status: DC | PRN
  Administered 2016-07-31: 75 ug/kg/min via INTRAVENOUS

## 2016-07-31 MED ORDER — PROPOFOL 10 MG/ML IV BOLUS
INTRAVENOUS | Status: AC
  Filled 2016-07-31: qty 40

## 2016-07-31 SURGICAL SUPPLY — 21 items

## 2016-07-31 NOTE — Op Note (Signed)
[facility] Patient Name: Kristen [name] Procedure Date: 07/31/2016 MRN: [id number] Attending MD: Arta Silence , MD Date of Birth: 12-16-1965 CSN: [id number] Age: 50 Admit Type: Outpatient Procedure:                Colonoscopy Indications:              Colon cancer screening in patient at increased                            risk: Family history of 1st-degree relative with                            colon polyps, This is the patient's first                            colonoscopy Providers:                Arta Silence, MD, Cleda Daub, RN, Otilio Saber, Technician, Delena Bali, CRNA Referring MD:             Melinda Crutch, MD Medicines:                Propofol per Anesthesia Complications:            No immediate complications. Estimated Blood Loss:     Estimated blood loss: none. Procedure:                Pre-Anesthesia Assessment:                           - Prior to the procedure, a History and Physical                            was performed, and patient medications and                            allergies were reviewed. The patient's tolerance of                            previous anesthesia was also reviewed. The risks                            and benefits of the procedure and the sedation                            options and risks were discussed with the patient.                            All questions were answered, and informed consent                            was obtained. Prior Anticoagulants: The patient has  taken aspirin. ASA Grade Assessment: III - A                            patient with severe systemic disease. After                            reviewing the risks and benefits, the patient was                            deemed in satisfactory condition to undergo the                            procedure.                           After obtaining informed consent, the colonoscope                             was passed under direct vision. Throughout the                            procedure, the patient's blood pressure, pulse, and                            oxygen saturations were monitored continuously. The                            EC-[id number] ([id number]) scope was introduced through                            the anus and advanced to the the cecum, identified                            by appendiceal orifice and ileocecal valve. The                            ileocecal valve, appendiceal orifice, and rectum                            were photographed. The entire colon was examined.                            The colonoscopy was performed without difficulty.                            The patient tolerated the procedure well. The                            quality of the bowel preparation was fair. Scope In: 8:56:19 AM Scope Out: 9:15:36 AM Scope Withdrawal Time: 0 hours 15 minutes 2 seconds  Total Procedure Duration: 0 hours 19 minutes 17 seconds  Findings:      The perianal exam findings include non-thrombosed external hemorrhoids.      A few small-mouthed diverticula were found in the sigmoid colon.  Bowel prep diffusely fair; semisolid and viscous stool obscured some       views throughout colon; diminutive or subtle sessile polyps could easily       have been missed.      Colon otherwise normal; no other polyps, masses, vascular ectasias, or       inflammatory changes were seen.      The retroflexed view of the distal rectum and anal verge was normal and       showed no anal or rectal abnormalities. Impression:               - Preparation of the colon was fair.                           - Non-thrombosed external hemorrhoids found on                            perianal exam.                           - Diverticulosis in the sigmoid colon.                           - The distal rectum and anal verge are normal on                            retroflexion  view.                           - The examination was otherwise normal. Moderate Sedation:      N/A- Per Anesthesia Care Recommendation:           - Discharge patient to home (ambulatory).                           - Resume previous diet today.                           - Continue present medications.                           - Repeat colonoscopy in 5 years for screening                            purposes.                           - Return to GI clinic PRN.                           - Return to referring physician as previously                            scheduled. Procedure Code(s):        --- Professional ---                           [phone], Colonoscopy, flexible; diagnostic, including  collection of specimen(s) by brushing or washing,                            when performed (separate procedure) Diagnosis Code(s):        --- Professional ---                           Z83.71, Family history of colonic polyps                           K64.4, Residual hemorrhoidal skin tags                           K57.30, Diverticulosis of large intestine without                            perforation or abscess without bleeding CPT copyright 2016 [employer]. All rights reserved. The codes documented in this report are preliminary and upon coder review may  be revised to meet current compliance requirements. Arta Silence, MD 07/31/2016 9:23:00 AM This report has been signed electronically. Number of Addenda: 0

## 2016-07-31 NOTE — Discharge Instructions (Signed)
YOU HAD AN ENDOSCOPIC PROCEDURE TODAY: Refer to the procedure report and other information in the discharge instructions given to you for any specific questions about what was found during the examination. If this information does not answer your questions, please call [facility] office at [phone] to clarify.   YOU SHOULD EXPECT: Some feelings of bloating in the abdomen. Passage of more gas than usual. Walking can help get rid of the air that was put into your GI tract during the procedure and reduce the bloating.  Some abdominal soreness may be present for a day or two, also.  DIET: Your first meal following the procedure should be a light meal and then it is ok to progress to your normal diet. A half-sandwich or bowl of soup is an example of a good first meal. Heavy or fried foods are harder to digest and may make you feel nauseous or bloated. Drink plenty of fluids but you should avoid alcoholic beverages for 24 hours. If you had a esophageal dilation, please see attached instructions for diet.   ACTIVITY: Your care partner should take you home directly after the procedure. You should plan to take it easy, moving slowly for the rest of the day. You can resume normal activity the day after the procedure however YOU SHOULD NOT DRIVE, use power tools, machinery or perform tasks that involve climbing or major physical exertion for 24 hours (because of the sedation medicines used during the test).   SYMPTOMS TO REPORT IMMEDIATELY: A gastroenterologist can be reached at any hour. Please call [phone]  for any of the following symptoms:   Following upper endoscopy (EGD, EUS, ERCP, esophageal dilation) Vomiting of blood or coffee ground material  New, significant abdominal pain  New, significant chest pain or pain under the shoulder blades  Painful or persistently difficult swallowing  New shortness of breath  Black, tarry-looking or red, bloody stools  FOLLOW UP:  If any biopsies were taken  you will be contacted by phone or by letter within the next 1-3 weeks. Call [phone]  if you have not heard about the biopsies in 3 weeks.  Please also call with any specific questions about appointments or follow up tests.

## 2016-07-31 NOTE — Transfer of Care (Signed)
Immediate Anesthesia Transfer of Care Note  Patient: Kristen [name]  Procedure(s) Performed: Procedure(s): COLONOSCOPY WITH PROPOFOL (N/A)  Patient Location: PACU  Anesthesia Type:MAC  Level of Consciousness:  sedated, patient cooperative and responds to stimulation  Airway & Oxygen Therapy:Patient Spontanous Breathing and Patient connected to face mask oxgen  Post-op Assessment:  Report given to PACU RN and Post -op Vital signs reviewed and stable  Post vital signs:  Reviewed and stable  Last Vitals:  Vitals:   07/31/16 0827 07/31/16 0922  BP: 111/61 94/61  Pulse:  76  Resp: 16 17  Temp: 19.8 C     Complications: No apparent anesthesia complications

## 2016-07-31 NOTE — Anesthesia Postprocedure Evaluation (Signed)
Anesthesia Post Note  Patient: Kristen [name]  Procedure(s) Performed: Procedure(s) (LRB): COLONOSCOPY WITH PROPOFOL (N/A)  Patient location during evaluation: PACU Anesthesia Type: MAC Level of consciousness: awake and alert Pain management: pain level controlled Vital Signs Assessment: post-procedure vital signs reviewed and stable Respiratory status: spontaneous breathing, nonlabored ventilation, respiratory function stable and patient connected to nasal cannula oxygen Cardiovascular status: stable and blood pressure returned to baseline Anesthetic complications: no    Last Vitals:  Vitals:   07/31/16 0922 07/31/16 0940  BP: 94/61 115/73  Pulse: 76   Resp: 17   Temp: 36.4 C     Last Pain:  Vitals:   07/31/16 0922  TempSrc: Oral                 Effie Berkshire

## 2016-07-31 NOTE — H&P (Signed)
Patient interval history reviewed.  Patient examined again.  There has been no change from documented H/P dated 07/22/16 (scanned into chart from our office) except as documented above.  Assessment:  1.  Colon cancer screening.  Plan:  1.  Colonoscopy. 2.  Risks (bleeding, infection, bowel perforation that could require surgery, sedation-related changes in cardiopulmonary systems), benefits (identification and possible treatment of source of symptoms, exclusion of certain causes of symptoms), and alternatives (watchful waiting, radiographic imaging studies, empiric medical treatment) of colonoscopy were explained to patient/family in detail and patient wishes to proceed.

## 2016-08-01 DIAGNOSIS — R05 Cough: Secondary | ICD-10-CM | POA: Diagnosis not present

## 2016-08-02 ENCOUNTER — Encounter (HOSPITAL_COMMUNITY): Payer: Self-pay | Admitting: Gastroenterology

## 2016-08-05 ENCOUNTER — Ambulatory Visit: Payer: Commercial Managed Care - HMO | Admitting: Physical Medicine & Rehabilitation

## 2016-08-26 ENCOUNTER — Encounter: Payer: Commercial Managed Care - HMO | Admitting: Registered Nurse

## 2016-08-27 ENCOUNTER — Ambulatory Visit: Payer: Commercial Managed Care - HMO | Admitting: Physical Medicine & Rehabilitation

## 2016-08-27 DIAGNOSIS — G4733 Obstructive sleep apnea (adult) (pediatric): Secondary | ICD-10-CM | POA: Diagnosis not present

## 2016-08-28 ENCOUNTER — Encounter: Payer: Self-pay | Admitting: Registered Nurse

## 2016-08-28 ENCOUNTER — Encounter: Payer: Commercial Managed Care - HMO | Attending: Physical Medicine & Rehabilitation | Admitting: Registered Nurse

## 2016-08-28 VITALS — BP 114/75 | HR 85 | Resp 14

## 2016-08-28 DIAGNOSIS — M1288 Other specific arthropathies, not elsewhere classified, other specified site: Secondary | ICD-10-CM

## 2016-08-28 DIAGNOSIS — I10 Essential (primary) hypertension: Secondary | ICD-10-CM | POA: Diagnosis not present

## 2016-08-28 DIAGNOSIS — M47816 Spondylosis without myelopathy or radiculopathy, lumbar region: Secondary | ICD-10-CM | POA: Diagnosis not present

## 2016-08-28 DIAGNOSIS — E119 Type 2 diabetes mellitus without complications: Secondary | ICD-10-CM | POA: Diagnosis not present

## 2016-08-28 DIAGNOSIS — G8929 Other chronic pain: Secondary | ICD-10-CM | POA: Diagnosis not present

## 2016-08-28 DIAGNOSIS — Z5181 Encounter for therapeutic drug level monitoring: Secondary | ICD-10-CM

## 2016-08-28 DIAGNOSIS — Z79899 Other long term (current) drug therapy: Secondary | ICD-10-CM | POA: Diagnosis not present

## 2016-08-28 DIAGNOSIS — Z794 Long term (current) use of insulin: Secondary | ICD-10-CM | POA: Insufficient documentation

## 2016-08-28 DIAGNOSIS — E669 Obesity, unspecified: Secondary | ICD-10-CM | POA: Diagnosis not present

## 2016-08-28 DIAGNOSIS — M545 Low back pain: Secondary | ICD-10-CM | POA: Insufficient documentation

## 2016-08-28 DIAGNOSIS — Z6841 Body Mass Index (BMI) 40.0 and over, adult: Secondary | ICD-10-CM

## 2016-08-28 DIAGNOSIS — Z7982 Long term (current) use of aspirin: Secondary | ICD-10-CM | POA: Insufficient documentation

## 2016-08-28 MED ORDER — OXYCODONE HCL 5 MG PO TABS: 5.0000 mg | ORAL_TABLET | Freq: Four times a day (QID) | ORAL | 0 refills | Status: DC | PRN

## 2016-08-28 NOTE — Progress Notes (Signed)
Subjective:    Patient ID: Kristen [name], female    DOB: [birth year], 50 y.o.   MRN: [id number]  HPI: [name] 50 year old female who returns for follow up appointment and medication refill. She states her pain is located in her lower back. She rates her pain 8. Her current exercise regime is walking and performing stretching exercises using the bands.   Educated on the Narcotic Policy regarding the codeine cough syrup, she was instructed to call office prior to filling any narcotic prescriptions. She  verbalizes understanding.  Ms. Flitton brought in injection records from Dr. Jacelyn Grip,  Forms will be scanned into the system. Dr. Naaman Plummer will review. She verbalizes understanding.   Pain Inventory Average Pain 9 Pain Right Now 8 My pain is constant and aching  In the last 24 hours, has pain interfered with the following? General activity 9 Relation with others 8 Enjoyment of life 10 What TIME of day is your pain at its worst? daytime,evening Sleep (in general) Poor  Pain is worse with: walking, bending, standing and some activites Pain improves with: rest, heat/ice and medication Relief from Meds: 7  Mobility walk without assistance how many minutes can you walk? 10 mins ability to climb steps?  no do you drive?  yes Do you have any goals in this area?  no  Function disabled: date disabled 12/19/2015 I need assistance with the following:  meal prep, household duties and shopping Do you have any goals in this area?  no  Neuro/Psych weakness tingling spasms dizziness depression anxiety  Prior Studies Any changes since last visit?  no  Physicians involved in your care Any changes since last visit?  no   Family History  Problem Relation Age of Onset  . Hypertension Maternal Grandmother   . Stroke Maternal Grandmother   . Diabetes Maternal Grandfather   . Cancer Mother     lung  . Cancer Father   . Diabetes Sister   . Heart attack Neg Hx    Social  History   Social History  . Marital status: Single    Spouse name: N/A  . Number of children: N/A  . Years of education: N/A   Social History Main Topics  . Smoking status: Never Smoker  . Smokeless tobacco: Never Used  . Alcohol use No  . Drug use: No  . Sexual activity: No   Other Topics Concern  . None   Social History Narrative  . None   Past Surgical History:  Procedure Laterality Date  . ABDOMINAL SURGERY     hernia repair  . CHOLECYSTECTOMY  [id number]  . COLONOSCOPY WITH PROPOFOL N/A 07/31/2016   Procedure: COLONOSCOPY WITH PROPOFOL;  Surgeon: Arta Silence, MD;  Location: [facility];  Service: Endoscopy;  Laterality: N/A;  . FOOT SURGERY Right 06/2007  . KNEE ARTHROSCOPY  09/2008   left  . KNEE ARTHROSCOPY  april 2011   left  . NASAL SINUS SURGERY    . NASAL SINUS SURGERY  1990  . SHOULDER SURGERY  2004   left   Past Medical History:  Diagnosis Date  . Anxiety   . Asthma   . 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 Depression   . Diabetes mellitus   . Hypertension   . Migraines   . Near syncope   . Obesity   . PCOS (polycystic ovarian syndrome)   . Peripheral edema   . Sleep apnea    uses cpap  set on 10  . 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  Respiratory: Positive for apnea, cough and shortness of breath.   Gastrointestinal: Positive for nausea.  Endocrine:       High blood sugar  All other  systems reviewed and are negative.      Objective:   Physical Exam  Constitutional: She is oriented to person, place, and time. She appears well-developed and well-nourished.  HENT:  Head: Normocephalic and atraumatic.  Neck: Normal range of motion. Neck supple.  Cardiovascular: Normal rate and regular rhythm.   Pulmonary/Chest: Effort normal and breath sounds normal.  Musculoskeletal:  Normal Muscle Bulk and Muscle Testing Reveals: Upper Extremities: Full ROM and Muscle Strength 5/5 Lumbar Paraspinal Tenderness: L-4-L-5 Lower Extremities: Full ROM and Muscle Strength 5/5 Arises from chair slowly Narrow Based Gait  Neurological: She is alert and oriented to person, place, and time.  Skin: Skin is warm and dry.  Psychiatric: She has a normal mood and affect.  Nursing note and vitals reviewed.         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  We will continue the opioid monitoring program, this consists of regular clinic visits, examinations, urine drug screen, pill counts as well as use of [place] Controlled Substance Reporting System.  2. Morbid obesity: Continue Healthy Diet Regime and HEP.  3. Type II diabetes:  Dr. Buddy Duty following   20 minutes of face to face patient care time was spent during this visit. All questions were encouraged and answered.  F/U in 1 month

## 2016-08-29 DIAGNOSIS — G4733 Obstructive sleep apnea (adult) (pediatric): Secondary | ICD-10-CM | POA: Diagnosis not present

## 2016-09-24 ENCOUNTER — Encounter
Payer: Commercial Managed Care - HMO | Attending: Physical Medicine & Rehabilitation | Admitting: Physical Medicine & Rehabilitation

## 2016-09-24 ENCOUNTER — Encounter: Payer: Self-pay | Admitting: Physical Medicine & Rehabilitation

## 2016-09-24 VITALS — BP 115/76 | HR 85 | Resp 14

## 2016-09-24 DIAGNOSIS — Z5181 Encounter for therapeutic drug level monitoring: Secondary | ICD-10-CM | POA: Diagnosis not present

## 2016-09-24 DIAGNOSIS — M47816 Spondylosis without myelopathy or radiculopathy, lumbar region: Secondary | ICD-10-CM | POA: Diagnosis not present

## 2016-09-24 DIAGNOSIS — G8929 Other chronic pain: Secondary | ICD-10-CM | POA: Insufficient documentation

## 2016-09-24 DIAGNOSIS — E669 Obesity, unspecified: Secondary | ICD-10-CM | POA: Insufficient documentation

## 2016-09-24 DIAGNOSIS — Z794 Long term (current) use of insulin: Secondary | ICD-10-CM | POA: Diagnosis not present

## 2016-09-24 DIAGNOSIS — I10 Essential (primary) hypertension: Secondary | ICD-10-CM | POA: Insufficient documentation

## 2016-09-24 DIAGNOSIS — Z7982 Long term (current) use of aspirin: Secondary | ICD-10-CM | POA: Diagnosis not present

## 2016-09-24 DIAGNOSIS — E119 Type 2 diabetes mellitus without complications: Secondary | ICD-10-CM | POA: Insufficient documentation

## 2016-09-24 DIAGNOSIS — Z6841 Body Mass Index (BMI) 40.0 and over, adult: Secondary | ICD-10-CM | POA: Diagnosis not present

## 2016-09-24 DIAGNOSIS — M1288 Other specific arthropathies, not elsewhere classified, other specified site: Secondary | ICD-10-CM | POA: Diagnosis not present

## 2016-09-24 DIAGNOSIS — Z79899 Other long term (current) drug therapy: Secondary | ICD-10-CM | POA: Diagnosis not present

## 2016-09-24 DIAGNOSIS — M545 Low back pain: Secondary | ICD-10-CM | POA: Diagnosis not present

## 2016-09-24 MED ORDER — OXYCODONE HCL 5 MG PO TABS: 5.0000 mg | ORAL_TABLET | Freq: Four times a day (QID) | ORAL | 0 refills | Status: DC | PRN

## 2016-09-24 NOTE — Progress Notes (Signed)
Subjective:    Patient ID: Kristen [name], female    DOB: 1966/07/21, 50 y.o.   MRN: [id number]  HPI   Kristen [name] is here in follow up of her chronic pain. She is having ongoing low and mid back pain. I had the opportunity to review some of her prior records. She has had MBB's but never enough results to justify RF's.   She was down at [employer] last month and struggled to get around despite having a w/c and scooter to help.   More recently she's had pain under her right shoulder pain.    Pain Inventory Average Pain 8 Pain Right Now 8 My pain is constant, sharp, burning and stabbing  In the last 24 hours, has pain interfered with the following? General activity 8 Relation with others 8 Enjoyment of life 10 What TIME of day is your pain at its worst? All Sleep (in general) Fair  Pain is worse with: walking, bending, standing and some activites Pain improves with: rest, heat/ice and medication Relief from Meds: 4  Mobility walk without assistance how many minutes can you walk? 6 ability to climb steps?  no do you drive?  yes use a wheelchair Do you have any goals in this area?  yes  Function disabled: date disabled n/a I need assistance with the following:  meal prep, household duties and shopping Do you have any goals in this area?  no  Neuro/Psych spasms dizziness depression anxiety suicidal thoughts  Prior Studies Any changes since last visit?  no  Physicians involved in your care Any changes since last visit?  no   Family History  Problem Relation Age of Onset  . Hypertension Maternal Grandmother   . Stroke Maternal Grandmother   . Diabetes Maternal Grandfather   . Cancer Mother     lung  . Cancer Father   . Diabetes Sister   . Heart attack Neg Hx    Social History   Social History  . Marital status: Single    Spouse name: N/A  . Number of children: N/A  . Years of education: N/A   Social History Main Topics  . Smoking status:  Never Smoker  . Smokeless tobacco: Never Used  . Alcohol use No  . Drug use: No  . Sexual activity: No   Other Topics Concern  . None   Social History Narrative  . None   Past Surgical History:  Procedure Laterality Date  . ABDOMINAL SURGERY     hernia repair  . CHOLECYSTECTOMY  [id number]  . COLONOSCOPY WITH PROPOFOL N/A 07/31/2016   Procedure: COLONOSCOPY WITH PROPOFOL;  Surgeon: Arta Silence, MD;  Location: [facility];  Service: Endoscopy;  Laterality: N/A;  . FOOT SURGERY Right 06/2007  . KNEE ARTHROSCOPY  09/2008   left  . KNEE ARTHROSCOPY  april 2011   left  . NASAL SINUS SURGERY    . NASAL SINUS SURGERY  1990  . SHOULDER SURGERY  2004   left   Past Medical History:  Diagnosis Date  . Anxiety   . Asthma   . 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 Depression   . Diabetes mellitus   . Hypertension   . Migraines   . Near syncope   . Obesity   . PCOS (polycystic ovarian syndrome)   . Peripheral edema   . Sleep apnea    uses cpap set on 10  . 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   Eyes: Negative.   Respiratory: Negative.   Cardiovascular: Negative.   Gastrointestinal: Positive for nausea.  Endocrine: Negative.   Genitourinary: Negative.   Musculoskeletal: Negative.   Skin: Negative.   Allergic/Immunologic: Negative.   Neurological: Negative.   Hematological: Negative.   Psychiatric/Behavioral: Positive for suicidal  ideas.       "No active plan but  when the pain is real bad I have these thoughts"  All other systems reviewed and are negative.      Objective:   Physical Exam  Constitutional: morbidly obese.  HENT:  Head: NCAT  Neck: Normal range of motion. Neck supple.  Cardiovascular: RRR.   Pulmonary/Chest: cta bilaterally.  Musculoskeletal:  Normal Muscle Bulk and Muscle Testing Reveals: Upper Extremities: Full ROM and Muscle Strength 5/5 Lumbar Paraspinal Tenderness: L-4-L-5 as well as L5-S1 Can bend and touch toes without significant pain. Extension and facet maneuvers reproduced pain in the low back on either side. Pain at inferior border of right scapula with palpation.  Lower Extremities: Full ROM and Muscle Strength 5/5  Neurological: She is alert and oriented to person, place, and time.  Skin: Skin is warm and dry.  Psychiatric:  pleasant  .         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   -continue the opioid monitoring program, this consists of regular clinic visits, examinations, urine drug screen, pill counts as well as use of [place] Controlled Substance Reporting System.   -will set up for bilateral MBB's at L4-5 and L5-S1 per Dr. Letta Pate. She has had temporary relief with these in the past but they have been only done at the L4-5 level. Consider RF's if appropropriate.   -reviewed posture and HEP as well today 2. Morbid obesity: Continue Healthy Diet Regime and HEP.  3. Type II diabetes:  Dr. Buddy Duty following   20 minutes of face to face patient care time was spent during this visit. All questions were encouraged and answered.  F/U in approximately a month for injections

## 2016-09-24 NOTE — Patient Instructions (Signed)
CONTINUE TO WORK ON YOUR POSTURE WHEN STANDING AND WALKING   PLEASE CALL ME WITH ANY PROBLEMS OR QUESTIONS ([phone])   HAPPY HOLIDAYS!!!!                    *                * *             *   *   *         *  *   *  *  *     *  *  *  *  *  *  * *  *  *  *  *  *  *  *  *  * *               *  *               *  *               *  *

## 2016-09-26 DIAGNOSIS — G4733 Obstructive sleep apnea (adult) (pediatric): Secondary | ICD-10-CM | POA: Diagnosis not present

## 2016-09-27 DIAGNOSIS — G4733 Obstructive sleep apnea (adult) (pediatric): Secondary | ICD-10-CM | POA: Diagnosis not present

## 2016-09-28 DIAGNOSIS — G4733 Obstructive sleep apnea (adult) (pediatric): Secondary | ICD-10-CM | POA: Diagnosis not present

## 2016-09-29 ENCOUNTER — Other Ambulatory Visit: Payer: Self-pay | Admitting: Physical Medicine & Rehabilitation

## 2016-10-03 DIAGNOSIS — M25569 Pain in unspecified knee: Secondary | ICD-10-CM | POA: Diagnosis not present

## 2016-10-03 DIAGNOSIS — B351 Tinea unguium: Secondary | ICD-10-CM | POA: Diagnosis not present

## 2016-10-11 DIAGNOSIS — M7542 Impingement syndrome of left shoulder: Secondary | ICD-10-CM | POA: Diagnosis not present

## 2016-10-11 DIAGNOSIS — M1712 Unilateral primary osteoarthritis, left knee: Secondary | ICD-10-CM | POA: Diagnosis not present

## 2016-10-27 DIAGNOSIS — G4733 Obstructive sleep apnea (adult) (pediatric): Secondary | ICD-10-CM | POA: Diagnosis not present

## 2016-10-29 DIAGNOSIS — G4733 Obstructive sleep apnea (adult) (pediatric): Secondary | ICD-10-CM | POA: Diagnosis not present

## 2016-10-31 ENCOUNTER — Encounter: Payer: Self-pay | Admitting: Physical Medicine & Rehabilitation

## 2016-10-31 ENCOUNTER — Ambulatory Visit (HOSPITAL_BASED_OUTPATIENT_CLINIC_OR_DEPARTMENT_OTHER): Payer: Medicare HMO | Admitting: Physical Medicine & Rehabilitation

## 2016-10-31 ENCOUNTER — Encounter: Payer: Medicare HMO | Attending: Physical Medicine & Rehabilitation

## 2016-10-31 VITALS — BP 108/68 | HR 82 | Resp 14

## 2016-10-31 DIAGNOSIS — Z79899 Other long term (current) drug therapy: Secondary | ICD-10-CM

## 2016-10-31 DIAGNOSIS — E119 Type 2 diabetes mellitus without complications: Secondary | ICD-10-CM | POA: Diagnosis not present

## 2016-10-31 DIAGNOSIS — Z5181 Encounter for therapeutic drug level monitoring: Secondary | ICD-10-CM

## 2016-10-31 DIAGNOSIS — Z794 Long term (current) use of insulin: Secondary | ICD-10-CM | POA: Insufficient documentation

## 2016-10-31 DIAGNOSIS — M1288 Other specific arthropathies, not elsewhere classified, other specified site: Secondary | ICD-10-CM

## 2016-10-31 DIAGNOSIS — E669 Obesity, unspecified: Secondary | ICD-10-CM | POA: Insufficient documentation

## 2016-10-31 DIAGNOSIS — M47816 Spondylosis without myelopathy or radiculopathy, lumbar region: Secondary | ICD-10-CM

## 2016-10-31 DIAGNOSIS — M545 Low back pain: Secondary | ICD-10-CM | POA: Diagnosis not present

## 2016-10-31 DIAGNOSIS — Z7982 Long term (current) use of aspirin: Secondary | ICD-10-CM | POA: Insufficient documentation

## 2016-10-31 DIAGNOSIS — I10 Essential (primary) hypertension: Secondary | ICD-10-CM | POA: Diagnosis not present

## 2016-10-31 DIAGNOSIS — G8929 Other chronic pain: Secondary | ICD-10-CM | POA: Insufficient documentation

## 2016-10-31 MED ORDER — OXYCODONE HCL 5 MG PO TABS: 5.0000 mg | ORAL_TABLET | Freq: Four times a day (QID) | ORAL | 0 refills | Status: DC | PRN

## 2016-10-31 NOTE — Progress Notes (Signed)
Bilateral Lumbar L3, L4  medial branch blocks and L 5 dorsal ramus injection under fluoroscopic guidance  Indication: Lumbar pain which is not relieved by medication management or other conservative care and interfering with self-care and mobility.  Informed consent was obtained after describing risks and benefits of the procedure with the patient, this includes bleeding, infection, paralysis and medication side effects.  The patient wishes to proceed and has given written consent.  The patient was placed in prone position.  The lumbar area was marked and prepped with Betadine.  One mL of 1% lidocaine was injected into each of 6 areas into the skin and subcutaneous tissue.  Then a 22-gauge 5in spinal needle was inserted targeting the junction of the left S1 superior articular process and sacral ala junction. Needle was advanced under fluoroscopic guidance.  Bone contact was made.  Omnipaque 180 was injected x 0.5 mL demonstrating no intravascular uptake.  Then a solution containing one mL of 4 mg per mL dexamethasone and 3 mL of 2% MPF lidocaine was injected x 0.5 mL.  Then the left L5 superior articular process in transverse process junction was targeted.  Bone contact was made.  Omnipaque 180 was injected x 0.5 mL demonstrating no intravascular uptake. Then a solution containing one mL of 4 mg per mL dexamethasone and 3 mL of 2% MPF lidocaine was injected x 0.5 mL.  Then the left L4 superior articular process in transverse process junction was targeted.  Bone contact was made.  Omnipaque 180 was injected x 0.5 mL demonstrating no intravascular uptake.  Then a solution containing one mL of 4 mg per mL dexamethasone and 3 mL if 2% MPF lidocaine was injected x 0.5 mL.  This same procedure was performed on the right side using the same needle, technique and injectate.  Patient tolerated procedure well.  Post procedure instructions were given.

## 2016-10-31 NOTE — Addendum Note (Signed)
Addended by: Marland Mcalpine B on: 10/31/2016 01:33 PM   Modules accepted: Orders

## 2016-10-31 NOTE — Addendum Note (Signed)
Addended by: Marland Mcalpine B on: 10/31/2016 01:35 PM   Modules accepted: Orders

## 2016-10-31 NOTE — Progress Notes (Signed)
  PROCEDURE RECORD [facility]   Name: Kristen [name] DOB:Nov 04, 1965 MRN: [id number]  Date:10/31/2016  Physician: Alysia Penna, MD    Nurse/CMA: Stokes Rattigan, CMA  Allergies:  Allergies  Allergen Reactions  . Cafergot Other (See Comments)    Chest pain  . Glucophage [Metformin Hcl] Anaphylaxis  . Glucophage [Metformin Hydrochloride] Anaphylaxis  . Metformin Anaphylaxis  . Canagliflozin Swelling and Other (See Comments)    Legs Swell invokana Legs Swell  . Ergotamine-Caffeine Other (See Comments)    Chest pain    Consent Signed: Yes.    Is patient diabetic? Yes.    CBG today? 86  Pregnant: No. LMP: No LMP recorded. (age 89-55)  Anticoagulants: no Anti-inflammatory: no Antibiotics: no  Procedure: bilateral medial branch block L4-L5, L5-S1  Position: Prone Start Time: 11:17am  End Time: 11:35am  Fluoro Time: 51  RN/CMA Dalonda Simoni, CMA Skarlette Lattner, CMA    Time 11:00am 11:40am    BP 108/68 154/84    Pulse 82 88    Respirations 14 14    O2 Sat 95 96    S/S 6 6    Pain Level 7/10 7/10     D/C home with sister Kristen [name]), patient A & O X 3, D/C instructions reviewed, and sits independently.

## 2016-11-04 ENCOUNTER — Encounter: Payer: Self-pay | Admitting: Physical Medicine & Rehabilitation

## 2016-11-07 LAB — TOXASSURE SELECT,+ANTIDEPR,UR

## 2016-11-08 DIAGNOSIS — R3915 Urgency of urination: Secondary | ICD-10-CM | POA: Diagnosis not present

## 2016-11-22 ENCOUNTER — Telehealth: Payer: Self-pay

## 2016-11-22 DIAGNOSIS — R609 Edema, unspecified: Secondary | ICD-10-CM | POA: Diagnosis not present

## 2016-11-22 DIAGNOSIS — N181 Chronic kidney disease, stage 1: Secondary | ICD-10-CM | POA: Diagnosis not present

## 2016-11-22 DIAGNOSIS — E1165 Type 2 diabetes mellitus with hyperglycemia: Secondary | ICD-10-CM | POA: Diagnosis not present

## 2016-11-22 DIAGNOSIS — E1122 Type 2 diabetes mellitus with diabetic chronic kidney disease: Secondary | ICD-10-CM | POA: Diagnosis not present

## 2016-11-22 DIAGNOSIS — Z6841 Body Mass Index (BMI) 40.0 and over, adult: Secondary | ICD-10-CM | POA: Diagnosis not present

## 2016-11-22 DIAGNOSIS — Z794 Long term (current) use of insulin: Secondary | ICD-10-CM | POA: Diagnosis not present

## 2016-11-22 NOTE — Telephone Encounter (Signed)
Called patient left voicemail message requesting she gives our office a call back to discuss her UDS encounter on 10/31/2016.

## 2016-11-22 NOTE — Progress Notes (Addendum)
Urine drug screen for this encounter is consistent for prescribed medication but patient took reminisce of an old medication. Patient has been notified and verbalize understanding according to her medication contract with our clinic she must follow its guidelines.

## 2016-11-22 NOTE — Telephone Encounter (Signed)
Patient returned my phone call. I had a question regarding her UDS. UDS came back with Hydrocodone. Patient states she was in so much pain that she took hydrocodone (a medication that was left over from an October Rx according to [facility].)  She has been advised that she does have a medication contract with our office an when she is an pain an runs out of pain medication, she must call our office and let our staff know. She verbalize understanding.

## 2016-11-27 ENCOUNTER — Encounter: Payer: Self-pay | Admitting: Physical Medicine & Rehabilitation

## 2016-11-27 ENCOUNTER — Encounter: Payer: Medicare HMO | Attending: Physical Medicine & Rehabilitation | Admitting: Physical Medicine & Rehabilitation

## 2016-11-27 VITALS — BP 117/69 | HR 94

## 2016-11-27 DIAGNOSIS — E669 Obesity, unspecified: Secondary | ICD-10-CM | POA: Insufficient documentation

## 2016-11-27 DIAGNOSIS — M1288 Other specific arthropathies, not elsewhere classified, other specified site: Secondary | ICD-10-CM

## 2016-11-27 DIAGNOSIS — I10 Essential (primary) hypertension: Secondary | ICD-10-CM | POA: Diagnosis not present

## 2016-11-27 DIAGNOSIS — G4733 Obstructive sleep apnea (adult) (pediatric): Secondary | ICD-10-CM | POA: Diagnosis not present

## 2016-11-27 DIAGNOSIS — M545 Low back pain: Secondary | ICD-10-CM | POA: Insufficient documentation

## 2016-11-27 DIAGNOSIS — Z5181 Encounter for therapeutic drug level monitoring: Secondary | ICD-10-CM | POA: Insufficient documentation

## 2016-11-27 DIAGNOSIS — Z79899 Other long term (current) drug therapy: Secondary | ICD-10-CM | POA: Insufficient documentation

## 2016-11-27 DIAGNOSIS — M47816 Spondylosis without myelopathy or radiculopathy, lumbar region: Secondary | ICD-10-CM

## 2016-11-27 DIAGNOSIS — Z794 Long term (current) use of insulin: Secondary | ICD-10-CM | POA: Insufficient documentation

## 2016-11-27 DIAGNOSIS — F329 Major depressive disorder, single episode, unspecified: Secondary | ICD-10-CM

## 2016-11-27 DIAGNOSIS — G8929 Other chronic pain: Secondary | ICD-10-CM | POA: Insufficient documentation

## 2016-11-27 DIAGNOSIS — Z7982 Long term (current) use of aspirin: Secondary | ICD-10-CM | POA: Diagnosis not present

## 2016-11-27 DIAGNOSIS — E119 Type 2 diabetes mellitus without complications: Secondary | ICD-10-CM | POA: Insufficient documentation

## 2016-11-27 MED ORDER — OXYCODONE HCL 10 MG PO TABS: 10.0000 mg | ORAL_TABLET | Freq: Three times a day (TID) | ORAL | 0 refills | Status: DC | PRN

## 2016-11-27 MED ORDER — CARISOPRODOL 350 MG PO TABS: 350.0000 mg | ORAL_TABLET | Freq: Three times a day (TID) | ORAL | 2 refills | Status: DC | PRN

## 2016-11-27 NOTE — Patient Instructions (Signed)
PLEASE FEEL FREE TO CALL OUR OFFICE WITH ANY PROBLEMS OR QUESTIONS ([phone])      

## 2016-11-27 NOTE — Progress Notes (Signed)
Subjective:    Patient ID: Kristen [name], female    DOB: [birth year], 51 y.o.   MRN: [id number]  HPI   Kristen [name] is here in follow up of her low back pain. She had absolutely no pain relief, not even for a few hours after the procedure. She has done nothing to exacerbate her back and the pain has persisted and in fact has worsened over the last several weeks.   Her heating pad helps somewhat as do pillows in bed.     Again her 2014 Lumbar MRI is pertinent for the below:  L4-5:  Moderate bilateral facet joint degenerative changes with bony overgrowth greater on the right.  Minimal impression upon the right posterior lateral aspect of the thecal sac.  Minimal anterior slip of L1.  Mild bulge slightly greater right lateral position approaching but not compressing the exiting right L4 nerve root. Slightly prominent epidural fat.  L5-S1:  Mild to slightly moderate right-sided and mild left-sided facet joint degenerative changes.  Conjoint left sided L5-S1 nerve root.  Prominent epidural fat.  Kristen [name] has voiced that she's becoming more depressed over the effects of her pain on her quality of life. She is limited in anything she can do. Her friends don't call her anymore because they know she can't tolerate going out. She is taking zoloft 100mg  currently , but had been on 200mg  in the past   Pain Inventory Average Pain 10 Pain Right Now 10 My pain is sharp, tingling and aching  In the last 24 hours, has pain interfered with the following? General activity 10 Relation with others 10 Enjoyment of life 10 What TIME of day is your pain at its worst? all Sleep (in general) Fair  Pain is worse with: walking, bending and standing Pain improves with: rest and medication Relief from Meds: 6  Mobility walk without assistance ability to climb steps?  no do you drive?  yes  Function disabled: date disabled 2013 I need assistance with the following:  meal prep, household duties  and shopping  Neuro/Psych weakness spasms depression anxiety suicidal thoughts  Prior Studies Any changes since last visit?  no  Physicians involved in your care Any changes since last visit?  no   Family History  Problem Relation Age of Onset  . Hypertension Maternal Grandmother   . Stroke Maternal Grandmother   . Diabetes Maternal Grandfather   . Cancer Mother     lung  . Cancer Father   . Diabetes Sister   . Heart attack Neg Hx    Social History   Social History  . Marital status: Single    Spouse name: N/A  . Number of children: N/A  . Years of education: N/A   Social History Main Topics  . Smoking status: Never Smoker  . Smokeless tobacco: Never Used  . Alcohol use No  . Drug use: No  . Sexual activity: No   Other Topics Concern  . Not on file   Social History Narrative  . No narrative on file   Past Surgical History:  Procedure Laterality Date  . ABDOMINAL SURGERY     hernia repair  . CHOLECYSTECTOMY  [id number]  . COLONOSCOPY WITH PROPOFOL N/A 07/31/2016   Procedure: COLONOSCOPY WITH PROPOFOL;  Surgeon: Arta Silence, MD;  Location: [facility];  Service: Endoscopy;  Laterality: N/A;  . FOOT SURGERY Right 06/2007  . KNEE ARTHROSCOPY  09/2008   left  . KNEE ARTHROSCOPY  april 2011   left  .  NASAL SINUS SURGERY    . NASAL SINUS SURGERY  1990  . SHOULDER SURGERY  2004   left   Past Medical History:  Diagnosis Date  . Anxiety   . Asthma   . 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 Depression   . Diabetes mellitus   . Hypertension   . Migraines   . Near syncope   . Obesity   . PCOS (polycystic ovarian syndrome)   . Peripheral edema   . Sleep apnea    uses cpap set on 10  . 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  Opioid Risk Score:   Fall Risk Score:  `1  Depression screen PHQ 2/9  Depression screen PHQ 2/9 07/01/2016  Decreased Interest 2  Down, Depressed, Hopeless 3    PHQ - 2 Score 5  Altered sleeping 3  Tired, decreased energy 3  Change in appetite 1  Feeling bad or failure about yourself  3  Trouble concentrating 1  Moving slowly or fidgety/restless 2  Suicidal thoughts 1  PHQ-9 Score 19   Review of Systems  Constitutional: Positive for diaphoresis.  HENT: Negative.   Eyes: Negative.   Respiratory: Positive for shortness of breath and wheezing.   Cardiovascular: Negative.   Gastrointestinal: Positive for nausea.  Endocrine: Negative.   Genitourinary: Negative.   Musculoskeletal: Positive for back pain, gait problem and joint swelling.  Skin: Negative.   Allergic/Immunologic: Negative.   Hematological: Negative.   Psychiatric/Behavioral: Positive for suicidal ideas.       Ideations of suicide when pain is unbearable  All other systems reviewed and are negative.      Objective:   Physical Exam  Constitutional: morbidly obese--no change in weight.  HENT:  Head: NCAT  Neck: Normal range of motion. Neck supple.  Cardiovascular: RRR.  Pulmonary/Chest: cta bilaterally.  Musculoskeletal:  Normal Muscle Bulk and Muscle Testing Reveals: Upper Extremities: Full ROM and Muscle Strength 5/5 Continued Lumbar Paraspinal Tenderness: L-3-S1 Can bend to 90 but has pain today. More pain however with extension and facet maneuvers.  Full ROM and Muscle Strength 5/5  Neurological: She is alertand oriented to person, place, and time.  Skin: Skin is warmand dry.  Psychiatric:  pleasant but became more somber when discussing her pain and its effects. Almost came to tears.   .       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              -We will continue the opioid monitoring program, this consists of regular clinic visits, examinations, urine drug screen, pill counts as well as use of [place] Controlled Substance  Reporting System.   -with increased pain will request another lumbar MRI. Last done in 2014              -continue with HEP as able  -try soma in place of flexeril 2. Morbid obesity: Continue Healthy Diet Regime and HEP.  3. Type II diabetes: Dr. Buddy Duty following 4. REactive depression: made referral to Dr. Sima Matas  -pt is struggling with the effects the pain has had on her quality of life and her depression is increasing  -continue zoloft 100mg  for now. Consider an increase, ?change to SNRI  76minutes of face to face patient care  time wasspent during this visit. All questions were encouraged and answered.Greater than 50% of time during this encounter was spent counseling patient/family in regard to image review, patient counseling.    F/U in approximately a month.

## 2016-11-29 DIAGNOSIS — G4733 Obstructive sleep apnea (adult) (pediatric): Secondary | ICD-10-CM | POA: Diagnosis not present

## 2016-12-09 ENCOUNTER — Encounter: Payer: Medicare HMO | Admitting: Psychology

## 2016-12-09 ENCOUNTER — Ambulatory Visit
Admission: RE | Admit: 2016-12-09 | Discharge: 2016-12-09 | Disposition: A | Discharge status: Home / Self Care | Payer: Medicare HMO | Source: Ambulatory Visit | Attending: Physical Medicine & Rehabilitation | Admitting: Physical Medicine & Rehabilitation

## 2016-12-09 ENCOUNTER — Telehealth: Payer: Self-pay | Admitting: Physical Medicine & Rehabilitation

## 2016-12-09 DIAGNOSIS — M47816 Spondylosis without myelopathy or radiculopathy, lumbar region: Secondary | ICD-10-CM

## 2016-12-09 DIAGNOSIS — M5126 Other intervertebral disc displacement, lumbar region: Secondary | ICD-10-CM | POA: Diagnosis not present

## 2016-12-09 NOTE — Telephone Encounter (Signed)
Left patient a VM regarding following MRI. She was instructed to call back  L3-L4: Mild disc narrowing. Facet arthropathy with moderate hypertrophy. No impingement  L4-L5: Severe facet arthropathy with anterolisthesis and bulky spurring. Chronic shallow right paracentral protrusion without impingement. Patent foramina  L5-S1:Facet arthropathy with spurring and joint fluid. No herniation or impingement.

## 2016-12-09 NOTE — Telephone Encounter (Signed)
Please set up Kristen [name] for bilateral L4-5, L5-S1 MBB's per Dr. Letta Pate. Patient is aware.

## 2016-12-16 DIAGNOSIS — Z794 Long term (current) use of insulin: Secondary | ICD-10-CM | POA: Diagnosis not present

## 2016-12-16 DIAGNOSIS — J45909 Unspecified asthma, uncomplicated: Secondary | ICD-10-CM | POA: Diagnosis not present

## 2016-12-16 DIAGNOSIS — E1122 Type 2 diabetes mellitus with diabetic chronic kidney disease: Secondary | ICD-10-CM | POA: Diagnosis not present

## 2016-12-16 DIAGNOSIS — M79646 Pain in unspecified finger(s): Secondary | ICD-10-CM | POA: Diagnosis not present

## 2016-12-16 DIAGNOSIS — M545 Low back pain: Secondary | ICD-10-CM | POA: Diagnosis not present

## 2016-12-22 ENCOUNTER — Encounter (HOSPITAL_COMMUNITY): Payer: Self-pay | Admitting: Emergency Medicine

## 2016-12-22 ENCOUNTER — Emergency Department (HOSPITAL_COMMUNITY): Payer: Medicare HMO

## 2016-12-22 ENCOUNTER — Observation Stay (HOSPITAL_COMMUNITY)
Admission: EM | Admit: 2016-12-22 | Discharge: 2016-12-23 | Disposition: A | Discharge status: Home / Self Care | Payer: Medicare HMO | Attending: Family Medicine | Admitting: Family Medicine

## 2016-12-22 ENCOUNTER — Emergency Department (HOSPITAL_BASED_OUTPATIENT_CLINIC_OR_DEPARTMENT_OTHER)
Admit: 2016-12-22 | Discharge: 2016-12-22 | Disposition: A | Discharge status: Home / Self Care | Payer: Medicare HMO | Attending: Student | Admitting: Student

## 2016-12-22 DIAGNOSIS — M79605 Pain in left leg: Secondary | ICD-10-CM | POA: Diagnosis not present

## 2016-12-22 DIAGNOSIS — Z79899 Other long term (current) drug therapy: Secondary | ICD-10-CM | POA: Diagnosis not present

## 2016-12-22 DIAGNOSIS — Z794 Long term (current) use of insulin: Secondary | ICD-10-CM | POA: Diagnosis not present

## 2016-12-22 DIAGNOSIS — I10 Essential (primary) hypertension: Secondary | ICD-10-CM | POA: Insufficient documentation

## 2016-12-22 DIAGNOSIS — Z8673 Personal history of transient ischemic attack (TIA), and cerebral infarction without residual deficits: Secondary | ICD-10-CM | POA: Diagnosis not present

## 2016-12-22 DIAGNOSIS — J45909 Unspecified asthma, uncomplicated: Secondary | ICD-10-CM | POA: Diagnosis present

## 2016-12-22 DIAGNOSIS — L03116 Cellulitis of left lower limb: Secondary | ICD-10-CM | POA: Diagnosis not present

## 2016-12-22 DIAGNOSIS — E119 Type 2 diabetes mellitus without complications: Secondary | ICD-10-CM | POA: Insufficient documentation

## 2016-12-22 DIAGNOSIS — M7989 Other specified soft tissue disorders: Secondary | ICD-10-CM | POA: Diagnosis not present

## 2016-12-22 DIAGNOSIS — Z6841 Body Mass Index (BMI) 40.0 and over, adult: Secondary | ICD-10-CM

## 2016-12-22 DIAGNOSIS — L03119 Cellulitis of unspecified part of limb: Secondary | ICD-10-CM | POA: Diagnosis not present

## 2016-12-22 DIAGNOSIS — R600 Localized edema: Secondary | ICD-10-CM | POA: Diagnosis not present

## 2016-12-22 DIAGNOSIS — E109 Type 1 diabetes mellitus without complications: Secondary | ICD-10-CM

## 2016-12-22 DIAGNOSIS — E139 Other specified diabetes mellitus without complications: Secondary | ICD-10-CM | POA: Diagnosis present

## 2016-12-22 DIAGNOSIS — M79609 Pain in unspecified limb: Secondary | ICD-10-CM | POA: Diagnosis not present

## 2016-12-22 DIAGNOSIS — L0390 Cellulitis, unspecified: Secondary | ICD-10-CM | POA: Diagnosis present

## 2016-12-22 DIAGNOSIS — Z7982 Long term (current) use of aspirin: Secondary | ICD-10-CM | POA: Insufficient documentation

## 2016-12-22 DIAGNOSIS — M47816 Spondylosis without myelopathy or radiculopathy, lumbar region: Secondary | ICD-10-CM

## 2016-12-22 DIAGNOSIS — R0600 Dyspnea, unspecified: Secondary | ICD-10-CM | POA: Diagnosis not present

## 2016-12-22 DIAGNOSIS — R52 Pain, unspecified: Secondary | ICD-10-CM | POA: Diagnosis not present

## 2016-12-22 LAB — I-STAT TROPONIN, ED: Troponin i, poc: 0 ng/mL (ref 0.00–0.08)

## 2016-12-22 LAB — CBC WITH DIFFERENTIAL/PLATELET
Basophils Absolute: 0 10*3/uL (ref 0.0–0.1)
Basophils Relative: 0 %
Eosinophils Absolute: 0.1 10*3/uL (ref 0.0–0.7)
Eosinophils Relative: 1 %
HCT: 35.1 % — ABNORMAL LOW (ref 36.0–46.0)
Hemoglobin: 11.5 g/dL — ABNORMAL LOW (ref 12.0–15.0)
Lymphocytes Relative: 16 %
Lymphs Abs: 2 10*3/uL (ref 0.7–4.0)
MCH: 29.9 pg (ref 26.0–34.0)
MCHC: 32.8 g/dL (ref 30.0–36.0)
MCV: 91.4 fL (ref 78.0–100.0)
Monocytes Absolute: 1.2 10*3/uL — ABNORMAL HIGH (ref 0.1–1.0)
Monocytes Relative: 10 %
Neutro Abs: 9.4 10*3/uL — ABNORMAL HIGH (ref 1.7–7.7)
Neutrophils Relative %: 73 %
Platelets: 246 10*3/uL (ref 150–400)
RBC: 3.84 MIL/uL — ABNORMAL LOW (ref 3.87–5.11)
RDW: 13.6 % (ref 11.5–15.5)
WBC: 12.7 10*3/uL — ABNORMAL HIGH (ref 4.0–10.5)

## 2016-12-22 LAB — BASIC METABOLIC PANEL
Anion gap: 6 (ref 5–15)
BUN: 24 mg/dL — ABNORMAL HIGH (ref 6–20)
CO2: 27 mmol/L (ref 22–32)
Calcium: 9 mg/dL (ref 8.9–10.3)
Chloride: 105 mmol/L (ref 101–111)
Creatinine, Ser: 1.1 mg/dL — ABNORMAL HIGH (ref 0.44–1.00)
GFR calc Af Amer: >60 mL/min (ref >60)
GFR calc non Af Amer: 57 mL/min — ABNORMAL LOW (ref >60)
Glucose, Bld: 187 mg/dL — ABNORMAL HIGH (ref 65–99)
Potassium: 4.6 mmol/L (ref 3.5–5.1)
Sodium: 138 mmol/L (ref 135–145)

## 2016-12-22 LAB — GLUCOSE, CAPILLARY: Glucose-Capillary: 105 mg/dL — ABNORMAL HIGH (ref 65–99)

## 2016-12-22 LAB — D-DIMER, QUANTITATIVE (NOT AT ARMC): D-Dimer, Quant: 0.67 ug/mL-FEU — ABNORMAL HIGH (ref 0.00–0.50)

## 2016-12-22 MED ORDER — VANCOMYCIN HCL 10 G IV SOLR
2500.0000 mg | Freq: Once | INTRAVENOUS | Status: AC
  Administered 2016-12-22: 2500 mg via INTRAVENOUS
  Filled 2016-12-22: qty 2000

## 2016-12-22 MED ORDER — VANCOMYCIN HCL 10 G IV SOLR
1250.0000 mg | Freq: Two times a day (BID) | INTRAVENOUS | Status: DC
  Administered 2016-12-23: 1250 mg via INTRAVENOUS
  Filled 2016-12-22 (×2): qty 1250

## 2016-12-22 MED ORDER — MORPHINE SULFATE (PF) 4 MG/ML IV SOLN
4.0000 mg | Freq: Once | INTRAVENOUS | Status: AC
  Administered 2016-12-22: 4 mg via INTRAVENOUS
  Filled 2016-12-22: qty 1

## 2016-12-22 MED ORDER — PIPERACILLIN SOD-TAZOBACTAM SO 2.25 (2-0.25) G IV SOLR
3.3750 g | Freq: Three times a day (TID) | INTRAVENOUS | Status: DC
  Administered 2016-12-22 – 2016-12-23 (×3): 3.375 g via INTRAVENOUS
  Filled 2016-12-22 (×4): qty 3.38

## 2016-12-22 MED ORDER — ONDANSETRON HCL 4 MG/2ML IJ SOLN
4.0000 mg | Freq: Four times a day (QID) | INTRAMUSCULAR | Status: DC | PRN
  Administered 2016-12-23: 4 mg via INTRAVENOUS
  Filled 2016-12-22: qty 2

## 2016-12-22 MED ORDER — SODIUM CHLORIDE 0.9% FLUSH: 3.0000 mL | INTRAVENOUS | Status: DC | PRN

## 2016-12-22 MED ORDER — ONDANSETRON HCL 4 MG PO TABS: 4.0000 mg | ORAL_TABLET | Freq: Four times a day (QID) | ORAL | Status: DC | PRN

## 2016-12-22 MED ORDER — INSULIN ASPART 100 UNIT/ML ~~LOC~~ SOLN
0.0000 [IU] | Freq: Three times a day (TID) | SUBCUTANEOUS | Status: DC
  Administered 2016-12-23: 3 [IU] via SUBCUTANEOUS
  Administered 2016-12-23: 1 [IU] via SUBCUTANEOUS

## 2016-12-22 MED ORDER — OXYCODONE HCL 5 MG PO TABS
10.0000 mg | ORAL_TABLET | Freq: Four times a day (QID) | ORAL | Status: DC | PRN
  Administered 2016-12-22 – 2016-12-23 (×3): 10 mg via ORAL
  Filled 2016-12-22 (×3): qty 2

## 2016-12-22 MED ORDER — ACETAMINOPHEN 325 MG PO TABS: 650.0000 mg | ORAL_TABLET | Freq: Four times a day (QID) | ORAL | Status: DC | PRN

## 2016-12-22 MED ORDER — ACETAMINOPHEN 650 MG RE SUPP: 650.0000 mg | Freq: Four times a day (QID) | RECTAL | Status: DC | PRN

## 2016-12-22 MED ORDER — ALBUTEROL SULFATE (2.5 MG/3ML) 0.083% IN NEBU: 3.0000 mL | INHALATION_SOLUTION | RESPIRATORY_TRACT | Status: DC | PRN

## 2016-12-22 MED ORDER — ENOXAPARIN SODIUM 100 MG/ML ~~LOC~~ SOLN
90.0000 mg | SUBCUTANEOUS | Status: DC
  Administered 2016-12-22: 90 mg via SUBCUTANEOUS
  Filled 2016-12-22: qty 1

## 2016-12-22 MED ORDER — SODIUM CHLORIDE 0.9% FLUSH
3.0000 mL | Freq: Two times a day (BID) | INTRAVENOUS | Status: DC
  Administered 2016-12-22: 3 mL via INTRAVENOUS

## 2016-12-22 MED ORDER — IOPAMIDOL (ISOVUE-370) INJECTION 76%
INTRAVENOUS | Status: AC
  Administered 2016-12-22: 100 mL
  Filled 2016-12-22: qty 100

## 2016-12-22 MED ORDER — SODIUM CHLORIDE 0.9 % IV SOLN: 250.0000 mL | INTRAVENOUS | Status: DC | PRN

## 2016-12-22 MED ORDER — ONDANSETRON HCL 4 MG/2ML IJ SOLN
4.0000 mg | Freq: Once | INTRAMUSCULAR | Status: AC
  Administered 2016-12-22: 4 mg via INTRAVENOUS
  Filled 2016-12-22: qty 2

## 2016-12-22 NOTE — ED Notes (Signed)
Vascular Ultrasound at bedside.  

## 2016-12-22 NOTE — ED Provider Notes (Signed)
[place] DEPT Provider Note   CSN: [id number] Arrival date & time: 12/22/16  1514     History   Chief Complaint Chief Complaint  Patient presents with  . Leg Pain    Left    HPI Kristen [name] is a 51 y.o. female.  HPI  51 year old Caucasian female with past medical history significant for diabetes, hypertension, asthma, obesity, peripheral edema presents to the ED today with complaints of left lower leg swelling, erythema, warmth, pain. Patient states that her symptoms started 2 days ago but gradually worsened today. States that the sharp stabbing pain in her calf started this morning. States that the redness and swelling has been increasing since yesterday. Patient has tried elevating it and icing it with Motrin and Tylenol at home without any relief. She has been unable to put significant amount of weight on the left leg. She also states that she is at increased work of breathing over the past 1-2 days. She does have history of asthma and has been using her albuterol inhaler. She states that she's had intermittent episodes of chest "soreness" that does not radiate. Not associated with nausea, emesis, diaphoresis. Episode of short lasting and self resolved. They're not pleuritic in nature. [facility] with concern for possible DVT/PE. They sent her to the ED for evaluation. Patient denies any fevers at home. She denies any lightheadedness, dizziness, weakness, abdominal pain, urinary symptoms, change in bowel habits. Past Medical History:  Diagnosis Date  . Anxiety   . Asthma   . 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 Depression   . Diabetes mellitus   . Hypertension   . Migraines   . Near syncope   . Obesity   . PCOS (polycystic ovarian syndrome)   . Peripheral edema   . Sleep apnea    uses cpap set on 10  . Stroke [facility]) 12/2011   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     Patient Active Problem List   Diagnosis Date Noted  .  Reactive depression 11/27/2016  . Spondylosis of lumbar region without myelopathy or radiculopathy 07/01/2016  . Lumbar facet arthropathy 07/01/2016  . Dizziness 06/15/2015  . Morbid obesity ([place]) 06/15/2015  . Diabetes mellitus type 2, uncomplicated ([place]) 25/02/3975  . Hyperlipidemia 06/15/2015  . Chronic pain syndrome 06/15/2015  . Amaurosis fugax 11/03/2012  . Orthostatic hypotension 10/30/2012  . Headache(784.0) 10/30/2012  . 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 Hypercholesteremia 10/01/2012  . 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 Hypertension 06/18/2012  . Asthma in adult 06/18/2012  . Morbid obesity with BMI of 60.0-69.9, adult ([place]) 06/18/2012    Past Surgical History:  Procedure Laterality Date  . ABDOMINAL SURGERY     hernia repair  . CHOLECYSTECTOMY  [id number]  . COLONOSCOPY WITH PROPOFOL N/A 07/31/2016   Procedure: COLONOSCOPY WITH PROPOFOL;  Surgeon: Arta Silence, MD;  [facility]: [facility];  Service: Endoscopy;  Laterality: N/A;  . FOOT SURGERY Right 06/2007  . KNEE ARTHROSCOPY  09/2008   left  . KNEE ARTHROSCOPY  april 2011   left  . NASAL SINUS SURGERY    . NASAL SINUS SURGERY  1990  . SHOULDER SURGERY  2004   left    OB History    Gravida Para Term Preterm AB Living   0             SAB TAB Ectopic Multiple Live Births  Home Medications    Prior to Admission medications   Medication Sig Start Date End Date Taking? Authorizing Provider  acetaminophen (TYLENOL) 500 MG tablet Take 1,000 mg by mouth every 6 (six) hours as needed for moderate pain.   Yes Historical Provider, MD  albuterol (ACCUNEB) 1.25 MG/3ML nebulizer solution Inhale 1 ampule into the lungs 3 (three) times daily as needed for wheezing or shortness of breath.  12/12/10  Yes Historical Provider, MD  albuterol (VENTOLIN HFA) 108 (90 Base) MCG/ACT inhaler Inhale 2 puffs into the lungs every 4 (four) hours as needed for wheezing or shortness of breath.   01/04/14  Yes Historical Provider, MD  aspirin 81 MG tablet Take 81 mg by mouth daily.   Yes Historical Provider, MD  atorvastatin (LIPITOR) 40 MG tablet Take 40 mg by mouth daily.   Yes Historical Provider, MD  carisoprodol (SOMA) 350 MG tablet Take 1 tablet (350 mg total) by mouth every 8 (eight) hours as needed for muscle spasms. 11/27/16  Yes Meredith Staggers, MD  Dextromethorphan-Guaifenesin (ROBITUSSIN DM) 10-100 MG/5ML liquid Take 15 mLs by mouth every 12 (twelve) hours as needed.   Yes Historical Provider, MD  Fluticasone-Salmeterol (ADVAIR) 500-50 MCG/DOSE AEPB Inhale 1 puff into the lungs every 12 (twelve) hours.   Yes Historical Provider, MD  ibuprofen (ADVIL,MOTRIN) 800 MG tablet Take 800 mg by mouth every 6 (six) hours as needed for fever, headache or moderate pain.    Yes Historical Provider, MD  insulin aspart (NOVOLOG FLEXPEN) 100 UNIT/ML FlexPen Inject 30 Units into the skin daily before supper. 07/26/16  Yes Historical Provider, MD  insulin glargine (LANTUS) 100 UNIT/ML injection Inject 150 Units into the skin at bedtime.    Yes Historical Provider, MD  lisinopril (PRINIVIL,ZESTRIL) 40 MG tablet Take 40 mg by mouth every evening.  02/29/16  Yes Historical Provider, MD  montelukast (SINGULAIR) 10 MG tablet Take 10 mg by mouth at bedtime.    Yes Historical Provider, MD  oxybutynin (DITROPAN) 5 MG tablet TAKE 1 TABLET BY MOUTH TWICE A DAY AS NEEDED FOR BLADDER 03/13/16  Yes Historical Provider, MD  Oxycodone HCl 10 MG TABS Take 1 tablet (10 mg total) by mouth every 8 (eight) hours as needed (pain). 11/27/16  Yes Meredith Staggers, MD  oxyCODONE-acetaminophen (PERCOCET/ROXICET) 5-325 MG tablet Take 1 tablet by mouth every 6 (six) hours as needed for pain.   Yes Historical Provider, MD  promethazine (PHENERGAN) 25 MG tablet Take 25 mg by mouth every 6 (six) hours as needed.    Yes Historical Provider, MD  torsemide (DEMADEX) 20 MG tablet Take 20 mg by mouth daily.   Yes Historical Provider, MD    VICTOZA 18 MG/3ML SOPN INJECT 1.8ML SUBCUTANEOUSLY ONCE DAILY FOR 30 DAYS 03/17/16  Yes Historical Provider, MD  [place] test strip USE AS DIRECTED TWICE A DAY IN VITRO 03/21/16   Historical Provider, MD  BD PEN NEEDLE NANO U/F 32G X 4 MM MISC 2 (two) times daily. as directed 03/13/16   Historical Provider, MD  Cholecalciferol (VITAMIN D3) 2000 units capsule Take 2,000 Units by mouth daily.     Historical Provider, MD  sertraline (ZOLOFT) 100 MG tablet Take 200 mg by mouth daily.     Historical Provider, MD    Family History Family History  Problem Relation Age of Onset  . Hypertension Maternal Grandmother   . Stroke Maternal Grandmother   . Diabetes Maternal Grandfather   . Cancer Mother  lung  . Cancer Father   . Diabetes Sister   . Heart attack Neg Hx     Social History Social History  Substance Use Topics  . Smoking status: Never Smoker  . Smokeless tobacco: Never Used  . Alcohol use No     Allergies   Cafergot; Glucophage [metformin hcl]; and Canagliflozin   Review of Systems Review of Systems  Constitutional: Negative for chills and fever.  HENT: Negative for congestion.   Eyes: Negative for visual disturbance.  Respiratory: Positive for cough and shortness of breath. Negative for wheezing.   Cardiovascular: Positive for chest pain and leg swelling (left). Negative for palpitations.  Gastrointestinal: Negative for abdominal pain, diarrhea, nausea and vomiting.  Genitourinary: Negative for dysuria, frequency, hematuria and urgency.  Skin: Positive for color change and wound.  Neurological: Negative for dizziness, syncope, weakness, light-headedness and headaches.  All other systems reviewed and are negative.    Physical Exam Updated Vital Signs BP 124/73 (BP Location: Left Arm)   Pulse 80   Temp 97.9 F (36.6 C) (Oral)   Resp 16   Ht 5\' 8"  (1.727 m)   Wt (!) 178.7 kg   LMP 12/05/2016   SpO2 99%   BMI 59.91 kg/m   Physical Exam   Constitutional: She is oriented to person, place, and time. She appears well-developed and well-nourished. No distress.  Morbidly obese. Nontoxic-appearing. Does not appear to be acute distress sitting on the bed.  HENT:  Head: Normocephalic and atraumatic.  Mouth/Throat: Oropharynx is clear and moist.  Eyes: Conjunctivae are normal. Right eye exhibits no discharge. Left eye exhibits no discharge. No scleral icterus.  Neck: Normal range of motion. Neck supple. No thyromegaly present.  Cardiovascular: Normal rate, regular rhythm, normal heart sounds and intact distal pulses.  Exam reveals no gallop and no friction rub.   No murmur heard. Pulmonary/Chest: Effort normal and breath sounds normal. No respiratory distress. She has no wheezes. She exhibits no tenderness.  Patient with mild increase work of breathing. Patient is not hypoxic or tachypneic.  Abdominal: Soft. Bowel sounds are normal. She exhibits no distension. There is no tenderness.  Musculoskeletal: Normal range of motion.  Significant edema to the left lower extremity. Extends from the foot up to the level of the knee. Erythema and warmth noted with scabbed wounds. DP pulses are 2+ bilaterally. Strength 5 out of 5 in lower extremities. Sensation intact to sharp/dull. Patient complains of tenderness to the calf region. No ecchymosis noted. Patient with full range of motion.  Lymphadenopathy:    She has no cervical adenopathy.  Neurological: She is alert and oriented to person, place, and time.  Skin: Skin is warm and dry. Capillary refill takes less than 2 seconds.  Erythema, edema, warmth noted to left lower extremity with scabbed wounds. No area of fluctuance or induration noted.  Nursing note and vitals reviewed.      ED Treatments / Results  Labs (all labs ordered are listed, but only abnormal results are displayed) Labs Reviewed  BASIC METABOLIC PANEL - Abnormal; Notable for the following:       Result Value   Glucose,  Bld 187 (*)    BUN 24 (*)    Creatinine, Ser 1.10 (*)    GFR calc non Af Amer 57 (*)    All other components within normal limits  CBC WITH DIFFERENTIAL/PLATELET - Abnormal; Notable for the following:    WBC 12.7 (*)    RBC 3.84 (*)  Hemoglobin 11.5 (*)    HCT 35.1 (*)    Neutro Abs 9.4 (*)    Monocytes Absolute 1.2 (*)    All other components within normal limits  D-DIMER, QUANTITATIVE (NOT AT [facility] - Abnormal; Notable for the following:    D-Dimer, Quant 0.67 (*)    All other components within normal limits  I-STAT TROPOININ, ED    EKG  EKG Interpretation  Date/Time:  Sunday December 22 2016 16:29:38 EST Ventricular Rate:  77 PR Interval:    QRS Duration: 86 QT Interval:  387 QTC Calculation: 438 R Axis:   49 Text Interpretation:  Sinus rhythm Low voltage, precordial leads Baseline wander in lead(s) V2 No significant change since last tracing Confirmed by ALLEN  MD, ANTHONY ([id number]) on 12/22/2016 5:15:23 PM       Radiology Ct Angio Chest Pe W/cm &/or Wo Cm  Result Date: 12/22/2016 CLINICAL DATA:  Left leg pain since this morning with increased swelling over the past day. Elevated D-dimer and dyspnea. EXAM: CT ANGIOGRAPHY CHEST WITH CONTRAST TECHNIQUE: Multidetector CT imaging of the chest was performed using the standard protocol during bolus administration of intravenous contrast. Multiplanar CT image reconstructions and MIPs were obtained to evaluate the vascular anatomy. CONTRAST:  100 cc Isovue 370 IV lipoma COMPARISON:  01/12/2015 CXR FINDINGS: Cardiovascular: The study is of quality for the evaluation of pulmonary embolism. There are no filling defects in the central, lobar, segmental or subsegmental pulmonary artery branches to suggest acute pulmonary embolism. Great vessels are normal in course and caliber. Normal heart size. No significant pericardial fluid/thickening. Mediastinum/Nodes: No discrete thyroid nodules. Unremarkable esophagus. No pathologically enlarged  axillary, mediastinal or hilar lymph nodes. No aortic aneurysm or dissection. No thyromegaly or mass. Lungs/Pleura: Tiny bilateral 3 mm or less nodular densities are identified in both lower lobes and right middle lobe possibly postinfectious or postinflammatory in etiology. No pleural effusion. No pneumothorax. Upper abdomen: Hepatic steatosis.  Cholecystectomy. Musculoskeletal: No aggressive appearing focal osseous lesions. Degenerative changes are noted along the dorsal spine. Review of the MIP images confirms the above findings. IMPRESSION: 1. No acute pulmonary embolus, aortic aneurysm or dissection. 2. Tiny 3 mm or less pulmonary nodules noted bilaterally. Findings may be postinfectious or postinflammatory in etiology. No follow-up needed if patient is low-risk (and has no known or suspected primary neoplasm). Non-contrast chest CT can be considered in 12 months if patient is high-risk. This recommendation follows the consensus statement: Guidelines for Management of Incidental Pulmonary Nodules Detected on CT Images: From the Fleischner Society 2017; Radiology 2017; 284:228-243. Electronically Signed   By: Ashley Royalty M.D.   On: 12/22/2016 19:01    Procedures Procedures (including critical care time)  Medications Ordered in ED Medications  morphine 4 MG/ML injection 4 mg (4 mg Intravenous Given 12/22/16 1639)  ondansetron (ZOFRAN) injection 4 mg (4 mg Intravenous Given 12/22/16 1639)  iopamidol (ISOVUE-370) 76 % injection (100 mLs  Contrast Given 12/22/16 1834)  morphine 4 MG/ML injection 4 mg (4 mg Intravenous Given 12/22/16 1854)     Initial Impression / Assessment and Plan / ED Course  I have reviewed the triage vital signs and the nursing notes.  Pertinent labs & imaging results that were available during my care of the patient were reviewed by me and considered in my medical decision making (see chart for details).     Patient presents to the ED with left lower extremity edema, erythema,  warmth. She also complains of intermittent chest pain or shortness  of breath. Examined at urgent care today and transferred to ED for further evaluation. On exam left lower extremity is warm to touch with significant edema and erythema. She does have calf tenderness. She is neurovascularly intact with normal strength. Mild leukocytosis of 12,000. DVT study was ordered and showed no lower extremity DVT. Patient is afebrile and not tachycardic. However exam is consistent with lower extremity cellulitis. Will start on pink per pharmacy consult. Patient also has increased work of breathing. She reports intermittent shortness of breath and chest pain. Does have a history of asthma. Troponin was negative. EKG without any acute changes from last tracing. D-dimer was elevated at 0.67. PE study was ordered. CTA chest shows no acute abnormalities. Patient is not tachycardic, hypoxic, tachypneic. She remains hemodynamically stable. Pain was treated with morphine. All other labs unremarkable. Informed CT findings. Spoke with Dr. Shanon Brow who agrees to admit patient to the hospital service for antibiotics and observation.  Final Clinical Impressions(s) / ED Diagnoses   Final diagnoses:  Cellulitis of left lower extremity    New Prescriptions New Prescriptions   No medications on file     Doristine Devoid, PA-C 12/22/16 1946    Lacretia Leigh, MD 12/24/16 [phone]

## 2016-12-22 NOTE — ED Notes (Signed)
Bed: JT70 Expected date: 12/22/16 Expected time: 2:50 PM Means of arrival: Ambulance Comments: Doy Mince - ? PE from [facility]

## 2016-12-22 NOTE — ED Triage Notes (Signed)
Per EMS, patient is complaining of left leg pain since this morning and increased swelling for the past day to this extremity  Patient is coming from [facility]. Patient being sent in for evaluation for possible DVT.

## 2016-12-22 NOTE — H&P (Signed)
History and Physical    ABI SHOULTS KYH:[id number] DOB: Jun 20, 1966 DOA: 12/22/2016  PCP: Melinda Crutch, MD  Patient coming from: home  Chief Complaint:  Leg red and swollen  HPI: Kristen [name] is a 51 y.o. female with medical history significant of HTN, DM, asthma, obesity comes in with 2 days of worsening redness and swelling to left leg.  Pt reports she gets edema in her legs which occasionally will blister due to the edema.  She has well healing blisters on her legs which is likely the source of entry.  She denies any fevers.  No n/v/d.  Pt referred for admission for her leg cellulitis.  Review of Systems: As per HPI otherwise 10 point review of systems negative.   Past Medical History:  Diagnosis Date  . Anxiety   . Asthma   . 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 Depression   . Diabetes mellitus   . Hypertension   . Migraines   . Near syncope   . Obesity   . PCOS (polycystic ovarian syndrome)   . Peripheral edema   . Sleep apnea    uses cpap set on 10  . 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 ABDOMINAL SURGERY     hernia repair  . CHOLECYSTECTOMY  [id number]  . COLONOSCOPY WITH PROPOFOL N/A 07/31/2016   Procedure: COLONOSCOPY WITH PROPOFOL;  Surgeon: Arta Silence, MD;  Location: [facility] ENDOSCOPY;  Service: Endoscopy;  Laterality: N/A;  . FOOT SURGERY Right 06/2007  . KNEE ARTHROSCOPY  09/2008   left  . KNEE ARTHROSCOPY  april 2011   left  . NASAL SINUS SURGERY    . NASAL SINUS SURGERY  1990  . SHOULDER SURGERY  2004   left     reports that she has never smoked. She has never used smokeless tobacco. She reports that she does not drink alcohol or use drugs.  Allergies  Allergen Reactions  . Cafergot Other (See Comments)    Chest pain; ergotamine-caffiene  . Glucophage [Metformin Hcl] Anaphylaxis  . Canagliflozin Swelling and Other (See Comments)    Legs Swell invokana Legs Swell     Family History  Problem Relation Age of Onset  . Hypertension Maternal Grandmother   . Stroke Maternal Grandmother   . Diabetes Maternal Grandfather   . Cancer Mother     lung  . Cancer Father   . Diabetes Sister   . Heart attack Neg Hx     Prior to Admission medications   Medication Sig Start Date End Date Taking? Authorizing Provider  acetaminophen (TYLENOL) 500 MG tablet Take 1,000 mg by mouth every 6 (six) hours as needed for moderate pain.   Yes Historical Provider, MD  albuterol (ACCUNEB) 1.25 MG/3ML nebulizer solution Inhale 1 ampule into the lungs 3 (three) times daily as needed for wheezing or shortness of breath.  12/12/10  Yes Historical Provider, MD  albuterol (VENTOLIN HFA) 108 (90 Base) MCG/ACT inhaler Inhale 2 puffs into the lungs every 4 (four) hours as needed for wheezing or shortness of breath.  01/04/14  Yes Historical Provider, MD  aspirin 81 MG tablet Take 81 mg by mouth daily.   Yes Historical Provider, MD  atorvastatin (LIPITOR) 40 MG tablet Take 40 mg by mouth daily.   Yes Historical Provider, MD  carisoprodol (SOMA) 350 MG tablet Take 1 tablet (350 mg total) by mouth  every 8 (eight) hours as needed for muscle spasms. 11/27/16  Yes Meredith Staggers, MD  Dextromethorphan-Guaifenesin (ROBITUSSIN DM) 10-100 MG/5ML liquid Take 15 mLs by mouth every 12 (twelve) hours as needed.   Yes Historical Provider, MD  Fluticasone-Salmeterol (ADVAIR) 500-50 MCG/DOSE AEPB Inhale 1 puff into the lungs every 12 (twelve) hours.   Yes Historical Provider, MD  ibuprofen (ADVIL,MOTRIN) 800 MG tablet Take 800 mg by mouth every 6 (six) hours as needed for fever, headache or moderate pain.    Yes Historical Provider, MD  insulin aspart (NOVOLOG FLEXPEN) 100 UNIT/ML FlexPen Inject 30 Units into the skin daily before supper. 07/26/16  Yes Historical Provider, MD  insulin glargine (LANTUS) 100 UNIT/ML injection Inject 150 Units into the skin at bedtime.    Yes Historical Provider, MD  lisinopril  (PRINIVIL,ZESTRIL) 40 MG tablet Take 40 mg by mouth every evening.  02/29/16  Yes Historical Provider, MD  montelukast (SINGULAIR) 10 MG tablet Take 10 mg by mouth at bedtime.    Yes Historical Provider, MD  oxybutynin (DITROPAN) 5 MG tablet TAKE 1 TABLET BY MOUTH TWICE A DAY AS NEEDED FOR BLADDER 03/13/16  Yes Historical Provider, MD  Oxycodone HCl 10 MG TABS Take 1 tablet (10 mg total) by mouth every 8 (eight) hours as needed (pain). 11/27/16  Yes Meredith Staggers, MD  oxyCODONE-acetaminophen (PERCOCET/ROXICET) 5-325 MG tablet Take 1 tablet by mouth every 6 (six) hours as needed for pain.   Yes Historical Provider, MD  promethazine (PHENERGAN) 25 MG tablet Take 25 mg by mouth every 6 (six) hours as needed.    Yes Historical Provider, MD  torsemide (DEMADEX) 20 MG tablet Take 20 mg by mouth daily.   Yes Historical Provider, MD  VICTOZA 18 MG/3ML SOPN INJECT 1.8ML SUBCUTANEOUSLY ONCE DAILY FOR 30 DAYS 03/17/16  Yes Historical Provider, MD  [place] test strip USE AS DIRECTED TWICE A DAY IN VITRO 03/21/16   Historical Provider, MD  BD PEN NEEDLE NANO U/F 32G X 4 MM MISC 2 (two) times daily. as directed 03/13/16   Historical Provider, MD  Cholecalciferol (VITAMIN D3) 2000 units capsule Take 2,000 Units by mouth daily.     Historical Provider, MD  sertraline (ZOLOFT) 100 MG tablet Take 200 mg by mouth daily.     Historical Provider, MD    Physical Exam: Vitals:   12/22/16 1730 12/22/16 1823 12/22/16 1900 12/22/16 1930  BP: 125/69 124/73 111/74 127/69  Pulse: 80 80 80 82  Resp: 17 16    Temp:      TempSrc:      SpO2: 95% 99% 97% 95%  Weight:      Height:        Constitutional: NAD, calm, comfortable Vitals:   12/22/16 1730 12/22/16 1823 12/22/16 1900 12/22/16 1930  BP: 125/69 124/73 111/74 127/69  Pulse: 80 80 80 82  Resp: 17 16    Temp:      TempSrc:      SpO2: 95% 99% 97% 95%  Weight:      Height:       Eyes: PERRL, lids and conjunctivae normal ENMT: Mucous membranes are  moist. Posterior pharynx clear of any exudate or lesions.Normal dentition.  Neck: normal, supple, no masses, no thyromegaly Respiratory: clear to auscultation bilaterally, no wheezing, no crackles. Normal respiratory effort. No accessory muscle use.  Cardiovascular: Regular rate and rhythm, no murmurs / rubs / gallops. No extremity edema. 2+ pedal pulses. No carotid bruits.  Abdomen: no tenderness,  no masses palpated. No hepatosplenomegaly. Bowel sounds positive.  Musculoskeletal: no clubbing / cyanosis. No joint deformity upper and lower extremities. Good ROM, no contractures. Normal muscle tone.  Skin: erthemtous rashes to left foot and leg up to knee, lesions, ulcers. No induration Neurologic: CN 2-12 grossly intact. Sensation intact, DTR normal. Strength 5/5 in all 4.  Psychiatric: Normal judgment and insight. Alert and oriented x 3. Normal mood.    Labs on Admission: I have personally reviewed following labs and imaging studies  CBC:  Recent Labs Lab 12/22/16 1627  WBC 12.7*  NEUTROABS 9.4*  HGB 11.5*  HCT 35.1*  MCV 91.4  PLT 616   Basic Metabolic Panel:  Recent Labs Lab 12/22/16 1627  NA 138  K 4.6  CL 105  CO2 27  GLUCOSE 187*  BUN 24*  CREATININE 1.10*  CALCIUM 9.0   GFR: Estimated Creatinine Clearance: 104.9 mL/min (by C-G formula based on SCr of 1.1 mg/dL (H)).  Radiological Exams on Admission: Ct Angio Chest Pe W/cm &/or Wo Cm  Result Date: 12/22/2016 CLINICAL DATA:  Left leg pain since this morning with increased swelling over the past day. Elevated D-dimer and dyspnea. EXAM: CT ANGIOGRAPHY CHEST WITH CONTRAST TECHNIQUE: Multidetector CT imaging of the chest was performed using the standard protocol during bolus administration of intravenous contrast. Multiplanar CT image reconstructions and MIPs were obtained to evaluate the vascular anatomy. CONTRAST:  100 cc Isovue 370 IV lipoma COMPARISON:  01/12/2015 CXR FINDINGS: Cardiovascular: The study is of quality  for the evaluation of pulmonary embolism. There are no filling defects in the central, lobar, segmental or subsegmental pulmonary artery branches to suggest acute pulmonary embolism. Great vessels are normal in course and caliber. Normal heart size. No significant pericardial fluid/thickening. Mediastinum/Nodes: No discrete thyroid nodules. Unremarkable esophagus. No pathologically enlarged axillary, mediastinal or hilar lymph nodes. No aortic aneurysm or dissection. No thyromegaly or mass. Lungs/Pleura: Tiny bilateral 3 mm or less nodular densities are identified in both lower lobes and right middle lobe possibly postinfectious or postinflammatory in etiology. No pleural effusion. No pneumothorax. Upper abdomen: Hepatic steatosis.  Cholecystectomy. Musculoskeletal: No aggressive appearing focal osseous lesions. Degenerative changes are noted along the dorsal spine. Review of the MIP images confirms the above findings. IMPRESSION: 1. No acute pulmonary embolus, aortic aneurysm or dissection. 2. Tiny 3 mm or less pulmonary nodules noted bilaterally. Findings may be postinfectious or postinflammatory in etiology. No follow-up needed if patient is low-risk (and has no known or suspected primary neoplasm). Non-contrast chest CT can be considered in 12 months if patient is high-risk. This recommendation follows the consensus statement: Guidelines for Management of Incidental Pulmonary Nodules Detected on CT Images: From the Fleischner Society 2017; Radiology 2017; 284:228-243. Electronically Signed   By: Ashley Royalty M.D.   On: 12/22/2016 19:01    EKG: Independently reviewed. nsr no acute issues  Assessment/Plan 51 yo female with lle cellulitis Principal Problem:   Cellulitis- iv vanc and zosyn.  Elevate leg.  Active Problems:   Diabetes 1.5, managed as type 1 ([place])- place on SSI   Hypertension- noted, and stable   Asthma in adult- cont nebs   Morbid obesity with BMI of 60.0-69.9, adult ([place] [address])- noted and  stable   Med rec incomplete and pending  DVT prophylaxis:  lovenox Code Status:  full Family Communication:  none Disposition Plan:  Per day team Consults called:  none Admission status:   observation   Feliberto Stockley A MD [facility]  If 7PM-7AM, please contact  night-coverage [email or web address] Password [email or web address]  12/22/2016, 7:45 PM

## 2016-12-22 NOTE — ED Notes (Signed)
Patient transported to CT 

## 2016-12-22 NOTE — Progress Notes (Signed)
Pharmacy Antibiotic Note  Kristen [name] is a 51 y.o. female admitted on 12/22/2016 with Cellulitis.  Pharmacy has been consulted for zosyn dosing.  Plan: Zosyn 3.375g IV q8h (4 hour infusion).  Height: 5\' 8"  (172.7 cm) Weight: (!) 390 lb 3.4 oz (177 kg) IBW/kg (Calculated) : 63.9  Temp (24hrs), Avg:98.2 F (36.8 C), Min:97.9 F (36.6 C), Max:98.4 F (36.9 C)   Recent Labs Lab 12/22/16 1627  WBC 12.7*  CREATININE 1.10*    Estimated Creatinine Clearance: 104.2 mL/min (by C-G formula based on SCr of 1.1 mg/dL (H)).    Allergies  Allergen Reactions  . Cafergot Other (See Comments)    Chest pain; ergotamine-caffiene  . Glucophage [Metformin Hcl] Anaphylaxis  . Canagliflozin Swelling and Other (See Comments)    Legs Swell invokana Legs Swell    Antimicrobials this admission: Vancomycin 12/22/2016 >> Zosyn 12/22/2016 >>   Dose adjustments this admission: -  Microbiology results: pending  Thank you for allowing pharmacy to be a part of this patient's care.  Kristen [name] 12/22/2016 9:15 PM

## 2016-12-22 NOTE — Progress Notes (Signed)
VASCULAR LAB PRELIMINARY  PRELIMINARY  PRELIMINARY  PRELIMINARY  Left lower extremity venous duplex completed.    Preliminary report:  There is no obvious evidence of DVT or SVT noted in the visualized veins of the left lower extremity.  There is an enlarged inguinal lymph node noted.  Gave report to Doristine Devoid, PA-C  Riyah Bardon, RVT 12/22/2016, 6:25 PM

## 2016-12-22 NOTE — Progress Notes (Signed)
Pharmacy Antibiotic Note  Kristen [name] is a 51 y.o. female admitted on 12/22/2016 with cellulitis.  Pharmacy has been consulted for Vancomycin dosing.  Plan:  Vancomycin 2500 mg IV now, then 1250 mg IV q12 hr; goal trough 15-20 mcg/mL  Measure vancomycin trough levels at steady state as indicated  Daily SCr   Height: 5\' 8"  (172.7 cm) Weight: (!) 394 lb (178.7 kg) IBW/kg (Calculated) : 63.9  Temp (24hrs), Avg:97.9 F (36.6 C), Min:97.9 F (36.6 C), Max:97.9 F (36.6 C)   Recent Labs Lab 12/22/16 1627  WBC 12.7*  CREATININE 1.10*    Estimated Creatinine Clearance: 104.9 mL/min (by C-G formula based on SCr of 1.1 mg/dL (H)).    Allergies  Allergen Reactions  . Cafergot Other (See Comments)    Chest pain; ergotamine-caffiene  . Glucophage [Metformin Hcl] Anaphylaxis  . Canagliflozin Swelling and Other (See Comments)    Legs Swell invokana Legs Swell      Thank you for allowing pharmacy to be a part of this patient's care.  Reuel Boom, PharmD, BCPS Pager: [phone] 12/22/2016, 8:43 PM

## 2016-12-22 NOTE — ED Notes (Signed)
ED Provider at bedside. 

## 2016-12-23 DIAGNOSIS — L03119 Cellulitis of unspecified part of limb: Secondary | ICD-10-CM

## 2016-12-23 DIAGNOSIS — I10 Essential (primary) hypertension: Secondary | ICD-10-CM

## 2016-12-23 LAB — CBC
HCT: 35.6 % — ABNORMAL LOW (ref 36.0–46.0)
Hemoglobin: 11.5 g/dL — ABNORMAL LOW (ref 12.0–15.0)
MCH: 29.9 pg (ref 26.0–34.0)
MCHC: 32.3 g/dL (ref 30.0–36.0)
MCV: 92.7 fL (ref 78.0–100.0)
Platelets: 255 10*3/uL (ref 150–400)
RBC: 3.84 MIL/uL — ABNORMAL LOW (ref 3.87–5.11)
RDW: 13.7 % (ref 11.5–15.5)
WBC: 11.6 10*3/uL — ABNORMAL HIGH (ref 4.0–10.5)

## 2016-12-23 LAB — BASIC METABOLIC PANEL
Anion gap: 7 (ref 5–15)
BUN: 18 mg/dL (ref 6–20)
CO2: 24 mmol/L (ref 22–32)
Calcium: 8.5 mg/dL — ABNORMAL LOW (ref 8.9–10.3)
Chloride: 106 mmol/L (ref 101–111)
Creatinine, Ser: 1.09 mg/dL — ABNORMAL HIGH (ref 0.44–1.00)
GFR calc Af Amer: >60 mL/min (ref >60)
GFR calc non Af Amer: 58 mL/min — ABNORMAL LOW (ref >60)
Glucose, Bld: 128 mg/dL — ABNORMAL HIGH (ref 65–99)
Potassium: 4 mmol/L (ref 3.5–5.1)
Sodium: 137 mmol/L (ref 135–145)

## 2016-12-23 LAB — GLUCOSE, CAPILLARY
Glucose-Capillary: 123 mg/dL — ABNORMAL HIGH (ref 65–99)
Glucose-Capillary: 203 mg/dL — ABNORMAL HIGH (ref 65–99)

## 2016-12-23 MED ORDER — OXYCODONE HCL 10 MG PO TABS: 10.0000 mg | ORAL_TABLET | Freq: Three times a day (TID) | ORAL | 0 refills | Status: DC | PRN

## 2016-12-23 MED ORDER — DOXYCYCLINE HYCLATE 100 MG PO TABS
100.0000 mg | ORAL_TABLET | Freq: Once | ORAL | Status: AC
  Administered 2016-12-23: 100 mg via ORAL
  Filled 2016-12-23: qty 1

## 2016-12-23 MED ORDER — DOXYCYCLINE MONOHYDRATE 100 MG PO TABS: 100.0000 mg | ORAL_TABLET | Freq: Two times a day (BID) | ORAL | 0 refills | Status: DC

## 2016-12-23 MED ORDER — AMLODIPINE BESYLATE 10 MG PO TABS
10.0000 mg | ORAL_TABLET | Freq: Every day | ORAL | Status: DC
  Administered 2016-12-23: 10 mg via ORAL
  Filled 2016-12-23: qty 1

## 2016-12-23 NOTE — Discharge Summary (Signed)
Kristen [name], is a 51 y.o. female  DOB 05-15-1966  MRN [id number].  Admission date:  12/22/2016  Admitting Physician  Phillips Grout, MD  Discharge Date:  12/23/2016   Primary MD  Melinda Crutch, MD  Recommendations for primary care physician for things to follow:   Lt Leg Cellulitis Recheck in about 3 days  Admission Diagnosis  Cellulitis of left lower extremity [L03.116]  Discharge Diagnosis  Cellulitis of left lower extremity [L03.116]    Principal Problem:   Cellulitis Active Problems:   Diabetes 1.5, managed as type 1 ([place])   Hypertension   Asthma in adult   Morbid obesity with BMI of 60.0-69.9, adult [facility])      Past Medical History:  Diagnosis Date  . Anxiety   . Asthma   . 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 Depression   . Diabetes mellitus   . Hypertension   . Migraines   . Near syncope   . Obesity   . PCOS (polycystic ovarian syndrome)   . Peripheral edema   . Sleep apnea    uses cpap set on 10  . 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 ABDOMINAL SURGERY     hernia repair  . CHOLECYSTECTOMY  [id number]  . COLONOSCOPY WITH PROPOFOL N/A 07/31/2016   Procedure: COLONOSCOPY WITH PROPOFOL;  Surgeon: Arta Silence, MD;  Location: [facility];  Service: Endoscopy;  Laterality: N/A;  . FOOT SURGERY Right 06/2007  . KNEE ARTHROSCOPY  09/2008   left  . KNEE ARTHROSCOPY  april 2011   left  . NASAL SINUS SURGERY    . NASAL SINUS SURGERY  1990  . SHOULDER SURGERY  2004   left       HPI  from the history and physical done on the day of admission:      HPI: Kristen [name] is a 51 y.o. female with medical history significant of HTN, DM, asthma, obesity comes in with 2 days of worsening redness and swelling to left leg.  Pt reports she gets edema in her legs which occasionally will blister due to the  edema.  She has well healing blisters on her legs which is likely the source of entry.  She denies any fevers.  No n/v/d.  Pt referred for admission for her leg cellulitis.    Hospital Course:     1)Lt LE Cellulitis- White count is 11,000, no fevers, patient had been treated with Vanco and Zosyn, okay to discharge home on doxycycline 100 mg twice a day, follow-up with PCP Dr. Harrington Challenger in 3 days for recheck  2)Lt LE swelling/Pain- Venous Dopplers preliminary report is negative for DVT, d-dimer was elevated, CTA chest without acute pulmonary embolism. Continue torsemide and elevate lower extremities  3)Morbid Obesity/ Diabetes 1.5, managed as type 1 -stable, continue home regimen  4)HTN- BP is not at goal, restart lisinopril, patient received 1 dose of  amlodipine, reluctant to use amlodipine long time due to chronic lower extremity edema which may get worse on amlodipine  5)ASthma/Possible OSA- no asthma flareup at this time, continue bronchodilators, follow-up with PCP as outpatient for possible sleep study   Discharge Condition: stable  Follow UP  Follow-up Information    Melinda Crutch, MD. Schedule an appointment as soon as possible for a visit in 3 day(s).   Specialty:  Family Medicine Why:  Follow-up for recheck of left lower extremity cellulitis within the next 3 days Contact information: [place] [address] [phone]            Consults obtained - none  Diet and Activity recommendation:  As advised  Discharge Instructions  * Discharge Instructions    Call MD for:  difficulty breathing, headache or visual disturbances    Complete by:  As directed    Call MD for:  persistant dizziness or light-headedness    Complete by:  As directed    Call MD for:  persistant nausea and vomiting    Complete by:  As directed    Call MD for:  redness, tenderness, or signs of infection (pain, swelling, redness, odor or green/yellow discharge around incision site)     Complete by:  As directed    Call MD for:  temperature >100.4    Complete by:  As directed    Diet - low sodium heart healthy    Complete by:  As directed    Discharge instructions    Complete by:  As directed    Take doxycycline 100 mg twice a day for leg infection Take fluid medicine as prescribed Follow-up with Dr. Harrington Challenger to recheck your leg infection in about 3 days If the oral antibiotics fail to improve your leg infection, then you may need to come back to the Hospital for IV antibiotics   Increase activity slowly    Complete by:  As directed         Discharge Medications     Allergies as of 12/23/2016      Reactions   Cafergot Other (See Comments)   Chest pain; ergotamine-caffiene   Glucophage [metformin Hcl] Anaphylaxis   Canagliflozin Swelling, Other (See Comments)   Legs Swell invokana Legs Swell      Medication List    STOP taking these medications   ibuprofen 800 MG tablet Commonly known as:  ADVIL,MOTRIN     TAKE these medications   ACCU-CHEK AVIVA PLUS test strip Generic drug:  glucose blood USE AS DIRECTED TWICE A DAY IN VITRO   acetaminophen 500 MG tablet Commonly known as:  TYLENOL Take 1,000 mg by mouth every 6 (six) hours as needed for moderate pain.   albuterol 1.25 MG/3ML nebulizer solution Commonly known as:  ACCUNEB Inhale 1 ampule into the lungs 3 (three) times daily as needed for wheezing or shortness of breath.   VENTOLIN HFA 108 (90 Base) MCG/ACT inhaler Generic drug:  albuterol Inhale 2 puffs into the lungs every 4 (four) hours as needed for wheezing or shortness of breath.   aspirin 81 MG tablet Take 81 mg by mouth daily.   atorvastatin 40 MG tablet Commonly known as:  LIPITOR Take 40 mg by mouth daily.   BD PEN NEEDLE NANO U/F 32G X 4 MM Misc Generic drug:  Insulin Pen Needle 2 (two) times daily. as directed   carisoprodol 350 MG tablet Commonly known as:  SOMA Take 1 tablet (350 mg total) by mouth  every 8 (eight) hours as  needed for muscle spasms.   doxycycline 100 MG tablet Commonly known as:  ADOXA Take 1 tablet (100 mg total) by mouth 2 (two) times daily.   Fluticasone-Salmeterol 500-50 MCG/DOSE Aepb Commonly known as:  ADVAIR Inhale 1 puff into the lungs every 12 (twelve) hours.   insulin glargine 100 UNIT/ML injection Commonly known as:  LANTUS Inject 150 Units into the skin at bedtime.   lisinopril 40 MG tablet Commonly known as:  PRINIVIL,ZESTRIL Take 40 mg by mouth every evening.   montelukast 10 MG tablet Commonly known as:  SINGULAIR Take 10 mg by mouth at bedtime.   NOVOLOG FLEXPEN 100 UNIT/ML FlexPen Generic drug:  insulin aspart Inject 30 Units into the skin daily before supper.   oxybutynin 5 MG tablet Commonly known as:  DITROPAN TAKE 1 TABLET BY MOUTH TWICE A DAY AS NEEDED FOR BLADDER   Oxycodone HCl 10 MG Tabs Take 1 tablet (10 mg total) by mouth every 8 (eight) hours as needed (pain).   oxyCODONE-acetaminophen 5-325 MG tablet Commonly known as:  PERCOCET/ROXICET Take 1 tablet by mouth every 6 (six) hours as needed for pain.   promethazine 25 MG tablet Commonly known as:  PHENERGAN Take 25 mg by mouth every 6 (six) hours as needed.   ROBITUSSIN DM 10-100 MG/5ML liquid Generic drug:  Dextromethorphan-Guaifenesin Take 15 mLs by mouth every 12 (twelve) hours as needed.   sertraline 100 MG tablet Commonly known as:  ZOLOFT Take 200 mg by mouth daily.   torsemide 20 MG tablet Commonly known as:  DEMADEX Take 20 mg by mouth daily.   VICTOZA 18 MG/3ML Sopn Generic drug:  liraglutide INJECT 1.8ML SUBCUTANEOUSLY ONCE DAILY FOR 30 DAYS   Vitamin D3 2000 units capsule Take 2,000 Units by mouth daily.       Major procedures and Radiology Reports - PLEASE review detailed and final reports for all details, in brief -    Ct Angio Chest Pe W/cm &/or Wo Cm  Result Date: 12/22/2016 CLINICAL DATA:  Left leg pain since this morning with increased swelling over the past  day. Elevated D-dimer and dyspnea. EXAM: CT ANGIOGRAPHY CHEST WITH CONTRAST TECHNIQUE: Multidetector CT imaging of the chest was performed using the standard protocol during bolus administration of intravenous contrast. Multiplanar CT image reconstructions and MIPs were obtained to evaluate the vascular anatomy. CONTRAST:  100 cc Isovue 370 IV lipoma COMPARISON:  01/12/2015 CXR FINDINGS: Cardiovascular: The study is of quality for the evaluation of pulmonary embolism. There are no filling defects in the central, lobar, segmental or subsegmental pulmonary artery branches to suggest acute pulmonary embolism. Great vessels are normal in course and caliber. Normal heart size. No significant pericardial fluid/thickening. Mediastinum/Nodes: No discrete thyroid nodules. Unremarkable esophagus. No pathologically enlarged axillary, mediastinal or hilar lymph nodes. No aortic aneurysm or dissection. No thyromegaly or mass. Lungs/Pleura: Tiny bilateral 3 mm or less nodular densities are identified in both lower lobes and right middle lobe possibly postinfectious or postinflammatory in etiology. No pleural effusion. No pneumothorax. Upper abdomen: Hepatic steatosis.  Cholecystectomy. Musculoskeletal: No aggressive appearing focal osseous lesions. Degenerative changes are noted along the dorsal spine. Review of the MIP images confirms the above findings. IMPRESSION: 1. No acute pulmonary embolus, aortic aneurysm or dissection. 2. Tiny 3 mm or less pulmonary nodules noted bilaterally. Findings may be postinfectious or postinflammatory in etiology. No follow-up needed if patient is low-risk (and has no known or suspected primary neoplasm). Non-contrast chest CT can be  considered in 12 months if patient is high-risk. This recommendation follows the consensus statement: Guidelines for Management of Incidental Pulmonary Nodules Detected on CT Images: From the Fleischner Society 2017; Radiology 2017; 284:228-243. Electronically Signed    By: Ashley Royalty M.D.   On: 12/22/2016 19:01   Mr Lumbar Spine Wo Contrast  Result Date: 12/09/2016 CLINICAL DATA:  Increasing low back pain failed injections and conservative management. EXAM: MRI LUMBAR SPINE WITHOUT CONTRAST TECHNIQUE: Multiplanar, multisequence MR imaging of the lumbar spine was performed. No intravenous contrast was administered. COMPARISON:  11/25/2012 FINDINGS: Segmentation:  Standard. Alignment: Slight L4-5 anterolisthesis and L3-4 retrolisthesis, facet mediated. Vertebrae: No fracture, evidence of discitis, or bone lesion. Hemangioma at L1. Conus medullaris: Extends to the T12-L1 level and appears normal. Paraspinal and other soft tissues: No significant incidental finding Disc levels: T12- L1: Spondylosis and mild disc narrowing. Negative facets. No impingement L1-L2: Normal disc.  Mild facet spurring.  No impingement L2-L3: Normal disc.  Mild facet spurring.  No impingement L3-L4: Mild disc narrowing. Facet arthropathy with moderate hypertrophy. No impingement L4-L5: Severe facet arthropathy with anterolisthesis and bulky spurring. Chronic shallow right paracentral protrusion without impingement. Patent foramina L5-S1:Facet arthropathy with spurring and joint fluid. No herniation or impingement. IMPRESSION: 1. No notable change compared to 2014.  No neural impingement. 2. Facet arthropathy that is advanced at L3-4 and L4-5 and causes mild listhesis. Electronically Signed   By: Monte Fantasia M.D.   On: 12/09/2016 09:09    Micro Results   No results found for this or any previous visit (from the past 240 hour(s)).     Today   Subjective    Brecklynn Jian today has no new complaints, h/o Bil LE edema/Venous stasis, No fever  Or chills, nausea, vomiting, diarrhea ,           Patient has been seen and examined prior to discharge   Objective   Blood pressure (!) 158/83, pulse 83, temperature 97.9 F (36.6 C), temperature source Oral, resp. rate 18, height 5\' 8"   (1.727 m), weight (!) 177 kg (390 lb 3.4 oz), last menstrual period 12/05/2016, SpO2 97 %.   Intake/Output Summary (Last 24 hours) at 12/23/16 1445 Last data filed at 12/23/16 1240  Gross per 24 hour  Intake              290 ml  Output                0 ml  Net              290 ml    Exam Gen:- Awake  Morbidly obese but in no acute distress HEENT:- [place].AT,   Neck-Supple Neck,No JVD,  Lungs- mostly clear  CV- S1, S2 normal Abd-  +ve B.Sounds, Abd Soft, No tenderness,    Extremity/Skin:- Intact peripheral pulses  , chronic venous stasis type changes of both lower extremities with swelling left more than right, no streaking, no drainage.    Data Review   CBC w Diff:  Lab Results  Component Value Date   WBC 11.6 (H) 12/23/2016   HGB 11.5 (L) 12/23/2016   HCT 35.6 (L) 12/23/2016   PLT 255 12/23/2016   LYMPHOPCT 16 12/22/2016   MONOPCT 10 12/22/2016   EOSPCT 1 12/22/2016   BASOPCT 0 12/22/2016    CMP:  Lab Results  Component Value Date   NA 137 12/23/2016   K 4.0 12/23/2016   CL 106 12/23/2016   CO2 24 12/23/2016  BUN 18 12/23/2016   CREATININE 1.09 (H) 12/23/2016   PROT 6.8 02/24/2008   ALBUMIN 3.4 (L) 02/24/2008   BILITOT 0.4 02/24/2008   ALKPHOS 55 02/24/2008   AST 19 02/24/2008   ALT 23 02/24/2008  .   Total Discharge time is about 33 minutes  Armon Orvis M.D on 12/23/2016 at 2:45 PM  [facility]   Office  [phone]  Dragon dictation system was used to create this note, attempts have been made to correct errors, however presence of uncorrected errors is not a reflection quality of care provided

## 2016-12-23 NOTE — Progress Notes (Signed)
Advance Beneficiary Notice of Non-coverage (ABN) issued to patient. Pt chooses to be discharged home. MD made aware. Marney Doctor RN,BSN,NCM [phone]

## 2016-12-23 NOTE — Care Management Obs Status (Signed)
[facility] NOTIFICATION   Patient Details  Name: Kristen [name] MRN: [id number] Date of Birth: Jan 13, 1966   Medicare Observation Status Notification Given:  Yes    Lynnell Catalan, RN 12/23/2016, 1:59 PM

## 2016-12-25 ENCOUNTER — Encounter: Payer: Self-pay | Admitting: Physical Medicine & Rehabilitation

## 2016-12-25 ENCOUNTER — Encounter: Payer: Medicare HMO | Attending: Physical Medicine & Rehabilitation | Admitting: Physical Medicine & Rehabilitation

## 2016-12-25 VITALS — BP 132/77 | HR 84 | Resp 16

## 2016-12-25 DIAGNOSIS — Z7982 Long term (current) use of aspirin: Secondary | ICD-10-CM | POA: Insufficient documentation

## 2016-12-25 DIAGNOSIS — M545 Low back pain: Secondary | ICD-10-CM | POA: Insufficient documentation

## 2016-12-25 DIAGNOSIS — M1288 Other specific arthropathies, not elsewhere classified, other specified site: Secondary | ICD-10-CM | POA: Diagnosis not present

## 2016-12-25 DIAGNOSIS — G8929 Other chronic pain: Secondary | ICD-10-CM | POA: Insufficient documentation

## 2016-12-25 DIAGNOSIS — E119 Type 2 diabetes mellitus without complications: Secondary | ICD-10-CM | POA: Insufficient documentation

## 2016-12-25 DIAGNOSIS — I10 Essential (primary) hypertension: Secondary | ICD-10-CM | POA: Insufficient documentation

## 2016-12-25 DIAGNOSIS — Z5181 Encounter for therapeutic drug level monitoring: Secondary | ICD-10-CM | POA: Diagnosis not present

## 2016-12-25 DIAGNOSIS — E669 Obesity, unspecified: Secondary | ICD-10-CM | POA: Insufficient documentation

## 2016-12-25 DIAGNOSIS — Z79899 Other long term (current) drug therapy: Secondary | ICD-10-CM | POA: Insufficient documentation

## 2016-12-25 DIAGNOSIS — L03116 Cellulitis of left lower limb: Secondary | ICD-10-CM

## 2016-12-25 DIAGNOSIS — M47816 Spondylosis without myelopathy or radiculopathy, lumbar region: Secondary | ICD-10-CM

## 2016-12-25 DIAGNOSIS — Z794 Long term (current) use of insulin: Secondary | ICD-10-CM | POA: Diagnosis not present

## 2016-12-25 DIAGNOSIS — G4733 Obstructive sleep apnea (adult) (pediatric): Secondary | ICD-10-CM | POA: Diagnosis not present

## 2016-12-25 MED ORDER — OXYCODONE HCL 10 MG PO TABS: 10.0000 mg | ORAL_TABLET | Freq: Three times a day (TID) | ORAL | 0 refills | Status: AC | PRN

## 2016-12-25 MED ORDER — OXYCODONE HCL 10 MG PO TABS: 10.0000 mg | ORAL_TABLET | Freq: Three times a day (TID) | ORAL | 0 refills | Status: DC | PRN

## 2016-12-25 NOTE — Progress Notes (Signed)
Subjective:    Patient ID: Kristen [name], female    DOB: Jan 20, 1966, 51 y.o.   MRN: [id number]  HPI   Kristen [name] is here in follow up of her chronic pain. Kristen [name] was hospitalized last week for cellulitis of her left leg. Kristen [name] is home on oral doxycycline now. The leg is still quite tender but is trending in the right direction.   From a standpoint of her low back pain, Kristen [name] has been doing better with the oxycodone and soma. Kristen [name] found that the incresae in zoloft to 200mg  was also helpful. Kristen [name] had been trying to exercise prior to the infection. Kristen [name] is working on her diet as well.   The plan was to repeat MBB's on 3/20 with Dr. Letta Pate based on her most recent lumbar MRI.     Pain Inventory Average Pain 8 Pain Right Now 7 My pain is sharp, stabbing and aching  In the last 24 hours, has pain interfered with the following? General activity 8 Relation with others 10 Enjoyment of life 10 What TIME of day is your pain at its worst? all Sleep (in general) Fair  Pain is worse with: walking, bending, standing and some activites Pain improves with: rest, heat/ice and medication Relief from Meds: 3  Mobility walk without assistance how many minutes can you walk? 6 ability to climb steps?  no do you drive?  yes  Function disabled: date disabled 12/19/15 I need assistance with the following:  meal prep, household duties and shopping  Neuro/Psych bladder control problems spasms dizziness depression anxiety  Prior Studies Any changes since last visit?  no  Physicians involved in your care Any changes since last visit?  no   Family History  Problem Relation Age of Onset  . Hypertension Maternal Grandmother   . Stroke Maternal Grandmother   . Diabetes Maternal Grandfather   . Cancer Mother     lung  . Cancer Father   . Diabetes Sister   . Heart attack Neg Hx    Social History   Social History  . Marital status: Single    Spouse name: N/A  . Number of children: N/A  .  Years of education: N/A   Social History Main Topics  . Smoking status: Never Smoker  . Smokeless tobacco: Never Used  . Alcohol use No  . Drug use: No  . Sexual activity: No   Other Topics Concern  . None   Social History Narrative  . None   Past Surgical History:  Procedure Laterality Date  . ABDOMINAL SURGERY     hernia repair  . CHOLECYSTECTOMY  [id number]  . COLONOSCOPY WITH PROPOFOL N/A 07/31/2016   Procedure: COLONOSCOPY WITH PROPOFOL;  Surgeon: Arta Silence, MD;  Location: [facility];  Service: Endoscopy;  Laterality: N/A;  . FOOT SURGERY Right 06/2007  . KNEE ARTHROSCOPY  09/2008   left  . KNEE ARTHROSCOPY  april 2011   left  . NASAL SINUS SURGERY    . NASAL SINUS SURGERY  1990  . SHOULDER SURGERY  2004   left   Past Medical History:  Diagnosis Date  . Anxiety   . Asthma   . 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 Depression   . Diabetes mellitus   . Hypertension   . Migraines   . Near syncope   . Obesity   . PCOS (polycystic ovarian syndrome)   . Peripheral edema   . Sleep apnea    uses cpap set on  10  . Stroke ([place]) 12/2011   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    BP 132/77   Pulse 84   Resp 16   LMP 12/05/2016   SpO2 96%   Opioid Risk Score:   Fall Risk Score:  `1  Depression screen PHQ 2/9  Depression screen [facility] 2/9 12/25/2016 07/01/2016  Decreased Interest 3 2  Down, Depressed, Hopeless 3 3  PHQ - 2 Score 6 5  Altered sleeping - 3  Tired, decreased energy - 3  Change in appetite - 1  Feeling bad or failure about yourself  - 3  Trouble concentrating - 1  Moving slowly or fidgety/restless - 2  Suicidal thoughts - 1  PHQ-9 Score - 19    Review of Systems  Constitutional: Positive for diaphoresis.  HENT: Negative.   Eyes: Negative.   Respiratory: Positive for apnea, cough, shortness of breath and wheezing.   Cardiovascular: Positive for leg swelling.  Gastrointestinal: Positive for constipation and nausea.  Endocrine:       High  blood sugars  Genitourinary: Positive for difficulty urinating.  Musculoskeletal:       Spasms  Skin: Positive for rash.  Allergic/Immunologic: Negative.   Neurological: Positive for dizziness.  Hematological: Negative.   Psychiatric/Behavioral: Positive for dysphoric mood. The patient is nervous/anxious.   All other systems reviewed and are negative.      Objective:   Physical Exam  Constitutional: remains morbidly obese HENT:  Head: NCAT Neck: Normal range of motion. Neck supple.  Cardiovascular: RRR.  Pulmonary/Chest: cta bilaterally.  Musculoskeletal: LLE 1++ edema Normal Muscle Bulk and Muscle Testing Reveals: Upper Extremities: Full ROM and Muscle Strength remains 5/5 Continued Lumbar Paraspinal Tenderness persists at L-3-S1 Can bend to 90 but has pain today. More pain however with extension and facet maneuvers.  Full ROM and Muscle Strength 5/5  Neurological: Kristen [name] is alertand oriented to person, place, and time.  Skin: left leg with dried blisters and erythema/cellulitic changes.  Psychiatric: pleasant and appropriate .       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  -We will continue the opioid monitoring program, this consists of regular clinic visits, examinations, urine drug screen, pill counts as well as use of [place] Controlled Substance Reporting System. [place] today.               - bilateral L4-5, L5-S1 MBB's per Dr. Letta Pate in 2 weeks if cellulitis is resolved  -continue with HEP as able             -continue soma for muscle spasms 2. Morbid obesity: Continue Healthy Diet Regime and HEP.  3. Type II diabetes: Dr. Buddy Duty following 4. REactive depression:  Referral pending to  Dr. Sima Matas             -pt is struggling with the effects the pain has had on her quality of life and  her depression is increasing             -increased zoloft 200mg  has helped her however 5. LLE Cellulitis  -doxycycline  -follow up per primary  46minutes of face to face patient care time wasspent during this visit. All questions were encouraged and answered. .    F/U in approximately a month fo MBB's

## 2016-12-25 NOTE — Patient Instructions (Signed)
PLEASE FEEL FREE TO CALL OUR OFFICE WITH ANY PROBLEMS OR QUESTIONS ([phone])      

## 2016-12-25 NOTE — Addendum Note (Signed)
Addended by: Alger Simons T on: 12/25/2016 02:04 PM   Modules accepted: Orders

## 2016-12-26 DIAGNOSIS — L03116 Cellulitis of left lower limb: Secondary | ICD-10-CM | POA: Diagnosis not present

## 2016-12-26 DIAGNOSIS — R600 Localized edema: Secondary | ICD-10-CM | POA: Diagnosis not present

## 2016-12-26 DIAGNOSIS — J45909 Unspecified asthma, uncomplicated: Secondary | ICD-10-CM | POA: Diagnosis not present

## 2016-12-27 DIAGNOSIS — G4733 Obstructive sleep apnea (adult) (pediatric): Secondary | ICD-10-CM | POA: Diagnosis not present

## 2016-12-31 DIAGNOSIS — G4733 Obstructive sleep apnea (adult) (pediatric): Secondary | ICD-10-CM | POA: Diagnosis not present

## 2017-01-01 DIAGNOSIS — M7989 Other specified soft tissue disorders: Secondary | ICD-10-CM | POA: Diagnosis not present

## 2017-01-01 DIAGNOSIS — B379 Candidiasis, unspecified: Secondary | ICD-10-CM | POA: Diagnosis not present

## 2017-01-01 DIAGNOSIS — I872 Venous insufficiency (chronic) (peripheral): Secondary | ICD-10-CM | POA: Diagnosis not present

## 2017-01-07 ENCOUNTER — Ambulatory Visit: Payer: Medicare HMO | Admitting: Physical Medicine & Rehabilitation

## 2017-01-16 ENCOUNTER — Ambulatory Visit: Payer: Medicare HMO | Admitting: Physical Medicine & Rehabilitation

## 2017-01-21 ENCOUNTER — Telehealth: Payer: Self-pay | Admitting: Physical Medicine & Rehabilitation

## 2017-01-21 NOTE — Telephone Encounter (Signed)
[employer] denied MBB procedure scheduled on 4/17 due to previous procedure giving no pain relief per [place] office note dated 11/27/16. Currently on [place] schedule with [facility] follow up in May. Please determine need for April DOS with [facility].

## 2017-01-23 ENCOUNTER — Encounter: Payer: Self-pay | Admitting: Physical Medicine & Rehabilitation

## 2017-01-24 ENCOUNTER — Encounter: Payer: Self-pay | Admitting: Registered Nurse

## 2017-01-24 ENCOUNTER — Encounter: Payer: Medicare HMO | Attending: Physical Medicine & Rehabilitation | Admitting: Registered Nurse

## 2017-01-24 ENCOUNTER — Telehealth: Payer: Self-pay | Admitting: Registered Nurse

## 2017-01-24 VITALS — BP 113/72 | HR 88

## 2017-01-24 DIAGNOSIS — M47816 Spondylosis without myelopathy or radiculopathy, lumbar region: Secondary | ICD-10-CM | POA: Diagnosis not present

## 2017-01-24 DIAGNOSIS — M545 Low back pain: Secondary | ICD-10-CM | POA: Diagnosis not present

## 2017-01-24 DIAGNOSIS — Z5181 Encounter for therapeutic drug level monitoring: Secondary | ICD-10-CM | POA: Diagnosis not present

## 2017-01-24 DIAGNOSIS — E669 Obesity, unspecified: Secondary | ICD-10-CM | POA: Diagnosis not present

## 2017-01-24 DIAGNOSIS — Z7982 Long term (current) use of aspirin: Secondary | ICD-10-CM | POA: Diagnosis not present

## 2017-01-24 DIAGNOSIS — M1288 Other specific arthropathies, not elsewhere classified, other specified site: Secondary | ICD-10-CM

## 2017-01-24 DIAGNOSIS — F329 Major depressive disorder, single episode, unspecified: Secondary | ICD-10-CM | POA: Diagnosis not present

## 2017-01-24 DIAGNOSIS — G8929 Other chronic pain: Secondary | ICD-10-CM | POA: Insufficient documentation

## 2017-01-24 DIAGNOSIS — R229 Localized swelling, mass and lump, unspecified: Secondary | ICD-10-CM | POA: Diagnosis not present

## 2017-01-24 DIAGNOSIS — M79642 Pain in left hand: Secondary | ICD-10-CM | POA: Diagnosis not present

## 2017-01-24 DIAGNOSIS — Z79899 Other long term (current) drug therapy: Secondary | ICD-10-CM | POA: Diagnosis not present

## 2017-01-24 DIAGNOSIS — E119 Type 2 diabetes mellitus without complications: Secondary | ICD-10-CM | POA: Insufficient documentation

## 2017-01-24 DIAGNOSIS — I10 Essential (primary) hypertension: Secondary | ICD-10-CM | POA: Diagnosis not present

## 2017-01-24 DIAGNOSIS — M79641 Pain in right hand: Secondary | ICD-10-CM | POA: Diagnosis not present

## 2017-01-24 DIAGNOSIS — Z794 Long term (current) use of insulin: Secondary | ICD-10-CM | POA: Diagnosis not present

## 2017-01-24 MED ORDER — OXYCODONE HCL 10 MG PO TABS: 10.0000 mg | ORAL_TABLET | Freq: Three times a day (TID) | ORAL | 0 refills | Status: DC | PRN

## 2017-01-24 NOTE — Telephone Encounter (Signed)
On 01/24/2017 the [place] was reviewed no conflict was seen on the [place] with multiple prescribers. Kristen [name] has a signed narcotic contract with our office. If there were any discrepancies this would have been reported to her physician.

## 2017-01-24 NOTE — Progress Notes (Signed)
Subjective:    Patient ID: Kristen [name], female    DOB: Oct 27, 1965, 51 y.o.   MRN: [id number]  HPI: [name] 51 year old female who returns for follow up appointment and medication refill. She states her pain is located in her lower back. She rates her pain 7. Her current exercise regime is walking.  Pain Inventory Average Pain 7 Pain Right Now 7 My pain is sharp, burning and aching  In the last 24 hours, has pain interfered with the following? General activity 8 Relation with others 6 Enjoyment of life 10 What TIME of day is your pain at its worst? evening Sleep (in general) Poor  Pain is worse with: walking, bending, standing and some activites Pain improves with: rest, heat/ice and medication Relief from Meds: 5  Mobility walk without assistance ability to climb steps?  no do you drive?  yes  Function disabled: date disabled 2013 I need assistance with the following:  meal prep, household duties and shopping  Neuro/Psych bladder control problems spasms dizziness depression anxiety suicidal thoughts  Prior Studies Any changes since last visit?  no  Physicians involved in your care Any changes since last visit?  no   Family History  Problem Relation Age of Onset  . Hypertension Maternal Grandmother   . Stroke Maternal Grandmother   . Diabetes Maternal Grandfather   . Cancer Mother     lung  . Cancer Father   . Diabetes Sister   . Heart attack Neg Hx    Social History   Social History  . Marital status: Single    Spouse name: N/A  . Number of children: N/A  . Years of education: N/A   Social History Main Topics  . Smoking status: Never Smoker  . Smokeless tobacco: Never Used  . Alcohol use No  . Drug use: No  . Sexual activity: No   Other Topics Concern  . Not on file   Social History Narrative  . No narrative on file   Past Surgical History:  Procedure Laterality Date  . ABDOMINAL SURGERY     hernia repair  .  CHOLECYSTECTOMY  [id number]  . COLONOSCOPY WITH PROPOFOL N/A 07/31/2016   Procedure: COLONOSCOPY WITH PROPOFOL;  Surgeon: Arta Silence, MD;  Location: [facility];  Service: Endoscopy;  Laterality: N/A;  . FOOT SURGERY Right 06/2007  . KNEE ARTHROSCOPY  09/2008   left  . KNEE ARTHROSCOPY  april 2011   left  . NASAL SINUS SURGERY    . NASAL SINUS SURGERY  1990  . SHOULDER SURGERY  2004   left   Past Medical History:  Diagnosis Date  . Anxiety   . Asthma   . 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 Depression   . Diabetes mellitus   . Hypertension   . Migraines   . Near syncope   . Obesity   . PCOS (polycystic ovarian syndrome)   . Peripheral edema   . Sleep apnea    uses cpap set on 10  . 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  Opioid Risk Score:   Fall Risk Score:  `1  Depression screen PHQ 2/9  Depression screen [facility] 2/9 12/25/2016 07/01/2016  Decreased Interest 3 2  Down, Depressed, Hopeless 3 3  PHQ - 2 Score 6 5  Altered sleeping - 3  Tired, decreased energy - 3  Change in appetite - 1  Feeling bad or failure about yourself  - 3  Trouble concentrating - 1  Moving slowly or fidgety/restless - 2  Suicidal thoughts - 1  PHQ-9 Score - 19    Review of Systems  Constitutional: Positive for diaphoresis, fever and unexpected weight change.  HENT: Negative.   Eyes: Negative.   Respiratory: Negative.   Cardiovascular: Negative.   Gastrointestinal: Positive for nausea.  Endocrine: Negative.   Genitourinary: Negative.   Musculoskeletal: Positive for joint swelling.  Skin: Negative.   Allergic/Immunologic: Negative.   Neurological: Negative.   Hematological: Negative.   Psychiatric/Behavioral: Negative.   All other systems reviewed and are negative.      Objective:   Physical Exam  Constitutional: She is oriented to person, place, and time. She appears well-developed and well-nourished.  HENT:    Head: Normocephalic and atraumatic.  Neck: Normal range of motion. Neck supple.  Cardiovascular: Normal rate and regular rhythm.   Pulmonary/Chest: Effort normal and breath sounds normal.  Musculoskeletal:  Normal Muscle Bulk and Muscle Testing Reveals: Upper Extremities: Full ROM and Muscle Strength 5/5 Thoracic Paraspinal Tenderness: T-7T- T-9 Lumbar Paraspinal Tenderness: L-3-L-5 Lower Extremities: Full ROM and Muscle Strength 5/5 Wearing compression stockings Arises from Table slowly Narrow Based Gait  Neurological: She is alert and oriented to person, place, and time.  Skin: Skin is warm and dry.  Psychiatric: She has a normal mood and affect.  Nursing note and vitals reviewed.         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  We will continue the opioid monitoring program, this consists of regular clinic visits, examinations, urine drug screen, pill counts as well as use of [place] Controlled Substance Reporting System.  2. Morbid obesity: Continue Healthy Diet Regime and HEP. 01/24/2017 3. Type II diabetes:  Dr. Buddy [name] following. 01/24/2017 4. Reactive Depression: Continue Zoloft, Refuses Counseling at this time  20 minutes of face to face patient care time was spent during this visit. All questions were encouraged and answered.  F/U in 1 month

## 2017-01-25 DIAGNOSIS — G4733 Obstructive sleep apnea (adult) (pediatric): Secondary | ICD-10-CM | POA: Diagnosis not present

## 2017-01-27 DIAGNOSIS — G4733 Obstructive sleep apnea (adult) (pediatric): Secondary | ICD-10-CM | POA: Diagnosis not present

## 2017-01-29 ENCOUNTER — Ambulatory Visit: Payer: Medicare HMO | Admitting: Physical Medicine & Rehabilitation

## 2017-02-03 DIAGNOSIS — I8310 Varicose veins of unspecified lower extremity with inflammation: Secondary | ICD-10-CM | POA: Diagnosis not present

## 2017-02-03 DIAGNOSIS — R600 Localized edema: Secondary | ICD-10-CM | POA: Diagnosis not present

## 2017-02-03 DIAGNOSIS — J309 Allergic rhinitis, unspecified: Secondary | ICD-10-CM | POA: Diagnosis not present

## 2017-02-04 ENCOUNTER — Ambulatory Visit: Payer: Medicare HMO | Admitting: Physical Medicine & Rehabilitation

## 2017-02-19 ENCOUNTER — Encounter: Payer: Self-pay | Admitting: Physical Medicine & Rehabilitation

## 2017-02-19 ENCOUNTER — Encounter: Payer: Medicare HMO | Attending: Physical Medicine & Rehabilitation | Admitting: Physical Medicine & Rehabilitation

## 2017-02-19 VITALS — BP 122/82 | HR 88

## 2017-02-19 DIAGNOSIS — Z6841 Body Mass Index (BMI) 40.0 and over, adult: Secondary | ICD-10-CM | POA: Diagnosis not present

## 2017-02-19 DIAGNOSIS — E119 Type 2 diabetes mellitus without complications: Secondary | ICD-10-CM | POA: Diagnosis not present

## 2017-02-19 DIAGNOSIS — M47816 Spondylosis without myelopathy or radiculopathy, lumbar region: Secondary | ICD-10-CM | POA: Diagnosis not present

## 2017-02-19 DIAGNOSIS — M545 Low back pain: Secondary | ICD-10-CM | POA: Insufficient documentation

## 2017-02-19 DIAGNOSIS — M4696 Unspecified inflammatory spondylopathy, lumbar region: Secondary | ICD-10-CM

## 2017-02-19 DIAGNOSIS — G8929 Other chronic pain: Secondary | ICD-10-CM | POA: Insufficient documentation

## 2017-02-19 DIAGNOSIS — I10 Essential (primary) hypertension: Secondary | ICD-10-CM | POA: Diagnosis not present

## 2017-02-19 DIAGNOSIS — Z5181 Encounter for therapeutic drug level monitoring: Secondary | ICD-10-CM | POA: Diagnosis not present

## 2017-02-19 DIAGNOSIS — Z79899 Other long term (current) drug therapy: Secondary | ICD-10-CM | POA: Diagnosis not present

## 2017-02-19 DIAGNOSIS — E669 Obesity, unspecified: Secondary | ICD-10-CM | POA: Insufficient documentation

## 2017-02-19 DIAGNOSIS — Z7982 Long term (current) use of aspirin: Secondary | ICD-10-CM | POA: Insufficient documentation

## 2017-02-19 DIAGNOSIS — Z794 Long term (current) use of insulin: Secondary | ICD-10-CM | POA: Insufficient documentation

## 2017-02-19 MED ORDER — PREDNISONE 20 MG PO TABS: 20.0000 mg | ORAL_TABLET | ORAL | 0 refills | Status: DC

## 2017-02-19 MED ORDER — OXYCODONE HCL 10 MG PO TABS: 10.0000 mg | ORAL_TABLET | Freq: Three times a day (TID) | ORAL | 0 refills | Status: DC | PRN

## 2017-02-19 MED ORDER — CARISOPRODOL 350 MG PO TABS: 350.0000 mg | ORAL_TABLET | Freq: Three times a day (TID) | ORAL | 2 refills | Status: DC | PRN

## 2017-02-19 NOTE — Progress Notes (Signed)
Subjective:    Patient ID: Kristen [name], female    DOB: [birth year], 51 y.o.   MRN: [id number]  HPI  Kristen [name] is here in follow up of her low back pain. She fell at home Saturday when she got dizzy when standing and landed hard on her back amongst other areas. She has been hurting more since then.   She was diagnosed with a syncopal episode but no changes were made in her medical plan.   Otherwise, Kristen [name] tries to stay as active as she can. She goes to the store, does her stretches, etc.    Pain Inventory Average Pain 6 Pain Right Now 8 My pain is constant and sharp  In the last 24 hours, has pain interfered with the following? General activity 8 Relation with others 8 Enjoyment of life 8 What TIME of day is your pain at its worst? evening Sleep (in general) Fair  Pain is worse with: walking, bending, standing and some activites Pain improves with: rest, heat/ice and medication Relief from Meds: 5  Mobility walk without assistance ability to climb steps?  no do you drive?  yes  Function disabled: date disabled 2017 I need assistance with the following:  meal prep, household duties and shopping  Neuro/Psych spasms dizziness depression anxiety  Prior Studies Any changes since last visit?  no  Physicians involved in your care Any changes since last visit?  no   Family History  Problem Relation Age of Onset  . Hypertension Maternal Grandmother   . Stroke Maternal Grandmother   . Diabetes Maternal Grandfather   . Cancer Mother     lung  . Cancer Father   . Diabetes Sister   . Heart attack Neg Hx    Social History   Social History  . Marital status: Single    Spouse name: N/A  . Number of children: N/A  . Years of education: N/A   Social History Main Topics  . Smoking status: Never Smoker  . Smokeless tobacco: Never Used  . Alcohol use No  . Drug use: No  . Sexual activity: No   Other Topics Concern  . None   Social History Narrative    . None   Past Surgical History:  Procedure Laterality Date  . ABDOMINAL SURGERY     hernia repair  . CHOLECYSTECTOMY  [id number]  . COLONOSCOPY WITH PROPOFOL N/A 07/31/2016   Procedure: COLONOSCOPY WITH PROPOFOL;  Surgeon: Arta Silence, MD;  Location: [facility];  Service: Endoscopy;  Laterality: N/A;  . FOOT SURGERY Right 06/2007  . KNEE ARTHROSCOPY  09/2008   left  . KNEE ARTHROSCOPY  april 2011   left  . NASAL SINUS SURGERY    . NASAL SINUS SURGERY  1990  . SHOULDER SURGERY  2004   left   Past Medical History:  Diagnosis Date  . Anxiety   . Asthma   . 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 Depression   . Diabetes mellitus   . Hypertension   . Migraines   . Near syncope   . Obesity   . PCOS (polycystic ovarian syndrome)   . Peripheral edema   . Sleep apnea    uses cpap set on 10  . 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   HENT: Negative.   Eyes: Negative.   Respiratory: Negative.   Cardiovascular: Negative.   Gastrointestinal: Negative.   Endocrine: Negative.   Genitourinary: Negative.   Musculoskeletal: Negative.   Skin: Negative.   Allergic/Immunologic: Negative.   Neurological: Negative.   Hematological: Negative.   Psychiatric/Behavioral: Negative.        Objective:   Physical Exam Constitutional: remains morbidly obese HENT:  Head: NCAT Neck: Normal range of motion. Neck supple.  Cardiovascular: RRR.   Pulmonary/Chest: CTA B Musculoskeletal: LLE 1+ edema, wearing compression sleaves Normal Muscle Bulk and Muscle Testing Reveals: Upper Extremities: Full ROM and Muscle Strength remains 5/5 Lumbar tendereness with extension and facet maneuvers. Flexed to 90 degrees with mild to moderate pain. Muscle Strength 5/5  Neurological: She is alertand oriented to person, place, and time.  Skin: legs are clear without breakdown.   Psychiatric: pleasant and appropriate .       Assessment & Plan:  1.Spondylosis Of Lumbar Region/ Lumbar Facet arthropathy:  -continue oxycodone 10mg  one tablet every 6 hours as needed, #90.  -We will continue the opioid monitoring program, this consists of regular clinic visits, examinations, urine drug screen, pill counts as well as use of [place] Controlled Substance Reporting System. [place] today.   -UDS today - bilateral L4-5, L5-S1 MBB's per Dr. Letta Pate. Denied by insurance. Will follow up on approval process -continue with HEP to maintain ROM and increase core strength.  -continue soma for muscle spasms. Refilled today  -gave patient a hand written prednisone taper to help with her acute on chronic symptoms.  2. Morbid obesity: Continue Healthy Diet Regime and HEP.  3. Type II diabetes: Dr. Buddy Duty following  -pt aware that CBG's will increase temporarily on prednisone 4. REactive depression:    -continue zoloft 200mg  daily which has helped 5. LLE Cellulitis             -finishing up keflex  -nearly resolved  57minutes of face to face patient care time wasspent during this visit. All questions were encouraged and answered. Follow up with NP next month.

## 2017-02-19 NOTE — Patient Instructions (Signed)
PLEASE FEEL FREE TO CALL OUR OFFICE WITH ANY PROBLEMS OR QUESTIONS ([phone])      

## 2017-02-24 DIAGNOSIS — Z794 Long term (current) use of insulin: Secondary | ICD-10-CM | POA: Diagnosis not present

## 2017-02-24 DIAGNOSIS — Z6841 Body Mass Index (BMI) 40.0 and over, adult: Secondary | ICD-10-CM | POA: Diagnosis not present

## 2017-02-24 DIAGNOSIS — R609 Edema, unspecified: Secondary | ICD-10-CM | POA: Diagnosis not present

## 2017-02-24 DIAGNOSIS — N181 Chronic kidney disease, stage 1: Secondary | ICD-10-CM | POA: Diagnosis not present

## 2017-02-24 DIAGNOSIS — E1122 Type 2 diabetes mellitus with diabetic chronic kidney disease: Secondary | ICD-10-CM | POA: Diagnosis not present

## 2017-02-24 DIAGNOSIS — E1165 Type 2 diabetes mellitus with hyperglycemia: Secondary | ICD-10-CM | POA: Diagnosis not present

## 2017-02-24 LAB — TOXASSURE SELECT,+ANTIDEPR,UR

## 2017-02-25 ENCOUNTER — Telehealth: Payer: Self-pay | Admitting: *Deleted

## 2017-02-25 NOTE — Telephone Encounter (Signed)
Urine drug screen for this encounter is consistent for prescribed medication 

## 2017-02-26 DIAGNOSIS — G4733 Obstructive sleep apnea (adult) (pediatric): Secondary | ICD-10-CM | POA: Diagnosis not present

## 2017-03-05 ENCOUNTER — Encounter: Payer: Self-pay | Admitting: Gynecology

## 2017-03-11 DIAGNOSIS — J45991 Cough variant asthma: Secondary | ICD-10-CM | POA: Diagnosis not present

## 2017-03-11 DIAGNOSIS — R0602 Shortness of breath: Secondary | ICD-10-CM | POA: Diagnosis not present

## 2017-03-21 ENCOUNTER — Inpatient Hospital Stay (HOSPITAL_COMMUNITY)
Admission: EM | Admit: 2017-03-21 | Discharge: 2017-03-24 | DRG: 176 | Disposition: A | Discharge status: Home / Self Care | Payer: Medicare HMO | Attending: Internal Medicine | Admitting: Internal Medicine

## 2017-03-21 ENCOUNTER — Inpatient Hospital Stay (HOSPITAL_COMMUNITY): Payer: Medicare HMO

## 2017-03-21 ENCOUNTER — Emergency Department (HOSPITAL_COMMUNITY): Payer: Medicare HMO

## 2017-03-21 ENCOUNTER — Encounter (HOSPITAL_COMMUNITY): Payer: Self-pay | Admitting: Emergency Medicine

## 2017-03-21 DIAGNOSIS — R0609 Other forms of dyspnea: Secondary | ICD-10-CM | POA: Diagnosis not present

## 2017-03-21 DIAGNOSIS — D72820 Lymphocytosis (symptomatic): Secondary | ICD-10-CM | POA: Diagnosis not present

## 2017-03-21 DIAGNOSIS — N183 Chronic kidney disease, stage 3 (moderate): Secondary | ICD-10-CM | POA: Diagnosis not present

## 2017-03-21 DIAGNOSIS — Z7982 Long term (current) use of aspirin: Secondary | ICD-10-CM

## 2017-03-21 DIAGNOSIS — M549 Dorsalgia, unspecified: Secondary | ICD-10-CM | POA: Diagnosis present

## 2017-03-21 DIAGNOSIS — I10 Essential (primary) hypertension: Secondary | ICD-10-CM | POA: Diagnosis not present

## 2017-03-21 DIAGNOSIS — K5730 Diverticulosis of large intestine without perforation or abscess without bleeding: Secondary | ICD-10-CM | POA: Diagnosis not present

## 2017-03-21 DIAGNOSIS — E1165 Type 2 diabetes mellitus with hyperglycemia: Secondary | ICD-10-CM | POA: Diagnosis not present

## 2017-03-21 DIAGNOSIS — G8929 Other chronic pain: Secondary | ICD-10-CM | POA: Diagnosis present

## 2017-03-21 DIAGNOSIS — Z79899 Other long term (current) drug therapy: Secondary | ICD-10-CM | POA: Diagnosis not present

## 2017-03-21 DIAGNOSIS — R161 Splenomegaly, not elsewhere classified: Secondary | ICD-10-CM | POA: Diagnosis not present

## 2017-03-21 DIAGNOSIS — E1122 Type 2 diabetes mellitus with diabetic chronic kidney disease: Secondary | ICD-10-CM | POA: Diagnosis not present

## 2017-03-21 DIAGNOSIS — N179 Acute kidney failure, unspecified: Secondary | ICD-10-CM

## 2017-03-21 DIAGNOSIS — E785 Hyperlipidemia, unspecified: Secondary | ICD-10-CM | POA: Diagnosis not present

## 2017-03-21 DIAGNOSIS — Z794 Long term (current) use of insulin: Secondary | ICD-10-CM | POA: Diagnosis not present

## 2017-03-21 DIAGNOSIS — I2699 Other pulmonary embolism without acute cor pulmonale: Secondary | ICD-10-CM | POA: Diagnosis not present

## 2017-03-21 DIAGNOSIS — J45909 Unspecified asthma, uncomplicated: Secondary | ICD-10-CM | POA: Diagnosis present

## 2017-03-21 DIAGNOSIS — E119 Type 2 diabetes mellitus without complications: Secondary | ICD-10-CM

## 2017-03-21 DIAGNOSIS — Z86711 Personal history of pulmonary embolism: Secondary | ICD-10-CM | POA: Diagnosis not present

## 2017-03-21 DIAGNOSIS — Z6841 Body Mass Index (BMI) 40.0 and over, adult: Secondary | ICD-10-CM | POA: Diagnosis not present

## 2017-03-21 DIAGNOSIS — I82401 Acute embolism and thrombosis of unspecified deep veins of right lower extremity: Secondary | ICD-10-CM | POA: Diagnosis not present

## 2017-03-21 DIAGNOSIS — I129 Hypertensive chronic kidney disease with stage 1 through stage 4 chronic kidney disease, or unspecified chronic kidney disease: Secondary | ICD-10-CM | POA: Diagnosis present

## 2017-03-21 DIAGNOSIS — R918 Other nonspecific abnormal finding of lung field: Secondary | ICD-10-CM | POA: Diagnosis not present

## 2017-03-21 DIAGNOSIS — I2609 Other pulmonary embolism with acute cor pulmonale: Secondary | ICD-10-CM | POA: Diagnosis not present

## 2017-03-21 DIAGNOSIS — R0600 Dyspnea, unspecified: Secondary | ICD-10-CM

## 2017-03-21 DIAGNOSIS — Z87310 Personal history of (healed) osteoporosis fracture: Secondary | ICD-10-CM | POA: Diagnosis not present

## 2017-03-21 DIAGNOSIS — D7289 Other specified disorders of white blood cells: Secondary | ICD-10-CM | POA: Diagnosis not present

## 2017-03-21 DIAGNOSIS — R0602 Shortness of breath: Secondary | ICD-10-CM | POA: Diagnosis not present

## 2017-03-21 LAB — COMPREHENSIVE METABOLIC PANEL
ALT: 33 U/L (ref 14–54)
AST: 33 U/L (ref 15–41)
Albumin: 2.8 g/dL — ABNORMAL LOW (ref 3.5–5.0)
Alkaline Phosphatase: 71 U/L (ref 38–126)
Anion gap: 4 — ABNORMAL LOW (ref 5–15)
BUN: 23 mg/dL — ABNORMAL HIGH (ref 6–20)
CO2: 23 mmol/L (ref 22–32)
Calcium: 8.1 mg/dL — ABNORMAL LOW (ref 8.9–10.3)
Chloride: 108 mmol/L (ref 101–111)
Creatinine, Ser: 1.26 mg/dL — ABNORMAL HIGH (ref 0.44–1.00)
GFR calc Af Amer: 56 mL/min — ABNORMAL LOW (ref >60)
GFR calc non Af Amer: 48 mL/min — ABNORMAL LOW (ref >60)
Glucose, Bld: 221 mg/dL — ABNORMAL HIGH (ref 65–99)
Potassium: 4.6 mmol/L (ref 3.5–5.1)
Sodium: 135 mmol/L (ref 135–145)
Total Bilirubin: 0.3 mg/dL (ref 0.3–1.2)
Total Protein: 6 g/dL — ABNORMAL LOW (ref 6.5–8.1)

## 2017-03-21 LAB — URINALYSIS, ROUTINE W REFLEX MICROSCOPIC
Bilirubin Urine: NEGATIVE
Glucose, UA: NEGATIVE mg/dL
Hgb urine dipstick: NEGATIVE
Ketones, ur: NEGATIVE mg/dL
Leukocytes, UA: NEGATIVE
Nitrite: NEGATIVE
Protein, ur: NEGATIVE mg/dL
Specific Gravity, Urine: 1.03 (ref 1.005–1.030)
pH: 5 (ref 5.0–8.0)

## 2017-03-21 LAB — CBC WITH DIFFERENTIAL/PLATELET
Basophils Absolute: 0 10*3/uL (ref 0.0–0.1)
Basophils Relative: 0 %
Eosinophils Absolute: 0.1 10*3/uL (ref 0.0–0.7)
Eosinophils Relative: 1 %
HCT: 36.5 % (ref 36.0–46.0)
Hemoglobin: 11.9 g/dL — ABNORMAL LOW (ref 12.0–15.0)
Lymphocytes Relative: 67 %
Lymphs Abs: 9.4 10*3/uL — ABNORMAL HIGH (ref 0.7–4.0)
MCH: 29.5 pg (ref 26.0–34.0)
MCHC: 32.6 g/dL (ref 30.0–36.0)
MCV: 90.6 fL (ref 78.0–100.0)
Monocytes Absolute: 1.1 10*3/uL — ABNORMAL HIGH (ref 0.1–1.0)
Monocytes Relative: 8 %
Neutro Abs: 3.4 10*3/uL (ref 1.7–7.7)
Neutrophils Relative %: 24 %
Platelets: 156 10*3/uL (ref 150–400)
RBC: 4.03 MIL/uL (ref 3.87–5.11)
RDW: 16 % — ABNORMAL HIGH (ref 11.5–15.5)
WBC: 14 10*3/uL — ABNORMAL HIGH (ref 4.0–10.5)

## 2017-03-21 LAB — D-DIMER, QUANTITATIVE: D-Dimer, Quant: 1.08 ug/mL-FEU — ABNORMAL HIGH (ref 0.00–0.50)

## 2017-03-21 LAB — CBG MONITORING, ED: Glucose-Capillary: 204 mg/dL — ABNORMAL HIGH (ref 65–99)

## 2017-03-21 LAB — I-STAT TROPONIN, ED: Troponin i, poc: 0 ng/mL (ref 0.00–0.08)

## 2017-03-21 LAB — BRAIN NATRIURETIC PEPTIDE: B Natriuretic Peptide: 27.2 pg/mL (ref 0.0–100.0)

## 2017-03-21 MED ORDER — SERTRALINE HCL 100 MG PO TABS
200.0000 mg | ORAL_TABLET | Freq: Every day | ORAL | Status: DC
  Administered 2017-03-22 – 2017-03-24 (×3): 200 mg via ORAL
  Filled 2017-03-21 (×3): qty 2

## 2017-03-21 MED ORDER — MONTELUKAST SODIUM 10 MG PO TABS
10.0000 mg | ORAL_TABLET | Freq: Every day | ORAL | Status: DC
  Administered 2017-03-21 – 2017-03-23 (×3): 10 mg via ORAL
  Filled 2017-03-21 (×3): qty 1

## 2017-03-21 MED ORDER — INSULIN GLARGINE 100 UNIT/ML ~~LOC~~ SOLN
80.0000 [IU] | Freq: Every day | SUBCUTANEOUS | Status: DC
  Administered 2017-03-21 – 2017-03-23 (×3): 80 [IU] via SUBCUTANEOUS
  Filled 2017-03-21 (×4): qty 0.8

## 2017-03-21 MED ORDER — IOPAMIDOL (ISOVUE-370) INJECTION 76%
INTRAVENOUS | Status: AC
  Administered 2017-03-21: 58 mL via INTRAVENOUS
  Filled 2017-03-21: qty 100

## 2017-03-21 MED ORDER — ALBUTEROL SULFATE HFA 108 (90 BASE) MCG/ACT IN AERS
2.0000 | INHALATION_SPRAY | RESPIRATORY_TRACT | Status: DC | PRN
  Filled 2017-03-21: qty 6.7

## 2017-03-21 MED ORDER — ATORVASTATIN CALCIUM 40 MG PO TABS
40.0000 mg | ORAL_TABLET | Freq: Every day | ORAL | Status: DC
  Administered 2017-03-22 – 2017-03-23 (×2): 40 mg via ORAL
  Filled 2017-03-21 (×2): qty 1

## 2017-03-21 MED ORDER — ASPIRIN 81 MG PO CHEW
81.0000 mg | CHEWABLE_TABLET | Freq: Every day | ORAL | Status: DC
  Administered 2017-03-22 – 2017-03-24 (×3): 81 mg via ORAL
  Filled 2017-03-21 (×3): qty 1

## 2017-03-21 MED ORDER — HEPARIN (PORCINE) IN NACL 100-0.45 UNIT/ML-% IJ SOLN
1800.0000 [IU]/h | INTRAMUSCULAR | Status: DC
  Administered 2017-03-21: 1800 [IU]/h via INTRAVENOUS
  Filled 2017-03-21 (×2): qty 250

## 2017-03-21 MED ORDER — INSULIN ASPART 100 UNIT/ML ~~LOC~~ SOLN
30.0000 [IU] | Freq: Three times a day (TID) | SUBCUTANEOUS | Status: DC
  Administered 2017-03-22 (×2): 30 [IU] via SUBCUTANEOUS

## 2017-03-21 MED ORDER — INSULIN ASPART 100 UNIT/ML ~~LOC~~ SOLN
0.0000 [IU] | Freq: Three times a day (TID) | SUBCUTANEOUS | Status: DC
  Administered 2017-03-22: 4 [IU] via SUBCUTANEOUS
  Administered 2017-03-22 – 2017-03-23 (×3): 3 [IU] via SUBCUTANEOUS
  Administered 2017-03-23 – 2017-03-24 (×2): 4 [IU] via SUBCUTANEOUS
  Administered 2017-03-24: 3 [IU] via SUBCUTANEOUS

## 2017-03-21 MED ORDER — VITAMIN D 1000 UNITS PO TABS
2000.0000 [IU] | ORAL_TABLET | Freq: Every day | ORAL | Status: DC
  Administered 2017-03-22 – 2017-03-24 (×3): 2000 [IU] via ORAL
  Filled 2017-03-21 (×3): qty 2

## 2017-03-21 MED ORDER — HEPARIN BOLUS VIA INFUSION
5500.0000 [IU] | Freq: Once | INTRAVENOUS | Status: AC
  Administered 2017-03-21: 5500 [IU] via INTRAVENOUS
  Filled 2017-03-21: qty 5500

## 2017-03-21 MED ORDER — ALBUTEROL SULFATE (2.5 MG/3ML) 0.083% IN NEBU
5.0000 mg | INHALATION_SOLUTION | Freq: Once | RESPIRATORY_TRACT | Status: AC
  Administered 2017-03-21: 5 mg via RESPIRATORY_TRACT
  Filled 2017-03-21: qty 6

## 2017-03-21 MED ORDER — SODIUM CHLORIDE 0.9% FLUSH
3.0000 mL | Freq: Two times a day (BID) | INTRAVENOUS | Status: DC
  Administered 2017-03-22 – 2017-03-24 (×3): 3 mL via INTRAVENOUS

## 2017-03-21 MED ORDER — MOMETASONE FURO-FORMOTEROL FUM 200-5 MCG/ACT IN AERO
2.0000 | INHALATION_SPRAY | Freq: Two times a day (BID) | RESPIRATORY_TRACT | Status: DC
  Administered 2017-03-21 – 2017-03-24 (×5): 2 via RESPIRATORY_TRACT
  Filled 2017-03-21 (×2): qty 8.8

## 2017-03-21 NOTE — ED Triage Notes (Signed)
Patient sent by PCP to rule out PE since patient having exertional dyspnea and back pain x 2 weeks.  Patient reports that she was at her MD today where lungs sounds were good and steroids and asthma medications should helped by now.

## 2017-03-21 NOTE — ED Provider Notes (Signed)
[facility] DEPT Provider Note   CSN: [id number] Arrival date & time: 03/21/17  1155     History   Chief Complaint Chief Complaint  Patient presents with  . sent here by PCP to rule out PE    HPI Kristen [name] is a 51 y.o. female.  Patient is a 51 year old female with past medical history of morbid obesity, hypertension, diabetes, and chronic back pain. She presents for evaluation of dyspnea that has been worsening over the past 2 weeks. Her breathing is worse when she ambulates and associated with discomfort in her upper back. She denies any nausea or diaphoresis. She denies any fevers, chills, or cough. She does have a history of asthma, however this does not feel like a flareup of this. She was seen by her primary doctor today, then sent here for further workup, specifically to rule out pulmonary embolism.   The history is provided by the patient.  Shortness of Breath  This is a new problem. The average episode lasts 2 weeks. The problem occurs continuously.The problem has been gradually worsening. Pertinent negatives include no fever, no sputum production, no chest pain, no leg pain and no leg swelling. She has tried nothing for the symptoms. The treatment provided no relief. Associated medical issues include asthma.    Past Medical History:  Diagnosis Date  . Anxiety   . Asthma   . 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 Depression   . Diabetes mellitus   . Hypertension   . Migraines   . Near syncope   . Obesity   . PCOS (polycystic ovarian syndrome)   . Peripheral edema   . Sleep apnea    uses cpap set on 10  . Stroke [facility]) 12/2011   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     Patient Active Problem List   Diagnosis Date Noted  . Cellulitis 12/22/2016  . Cellulitis of left lower extremity   . Reactive depression 11/27/2016  . Spondylosis of lumbar region without myelopathy or radiculopathy 07/01/2016  . Lumbar facet arthropathy ([place]) 07/01/2016  . Dizziness  06/15/2015  . Morbid obesity ([place]) 06/15/2015  . Diabetes mellitus type 2, uncomplicated ([place]) 40/98/1191  . Hyperlipidemia 06/15/2015  . Chronic pain syndrome 06/15/2015  . Amaurosis fugax 11/03/2012  . Orthostatic hypotension 10/30/2012  . Headache(784.0) 10/30/2012  . 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 Hypercholesteremia 10/01/2012  . 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 Hypertension 06/18/2012  . Asthma in adult 06/18/2012  . Morbid obesity with BMI of 60.0-69.9, adult ([place]) 06/18/2012    Past Surgical History:  Procedure Laterality Date  . ABDOMINAL SURGERY     hernia repair  . CHOLECYSTECTOMY  [id number]  . COLONOSCOPY WITH PROPOFOL N/A 07/31/2016   Procedure: COLONOSCOPY WITH PROPOFOL;  Surgeon: Arta Silence, MD;  Location: [facility];  Service: Endoscopy;  Laterality: N/A;  . FOOT SURGERY Right 06/2007  . KNEE ARTHROSCOPY  09/2008   left  . KNEE ARTHROSCOPY  april 2011   left  . NASAL SINUS SURGERY    . NASAL SINUS SURGERY  1990  . SHOULDER SURGERY  2004   left    OB History    Gravida Para Term Preterm AB Living   0             SAB TAB Ectopic Multiple Live Births                   Home Medications  Prior to Admission medications   Medication Sig Start Date End Date Taking? Authorizing Provider  ACCU-CHEK AVIVA PLUS test strip USE AS DIRECTED TWICE A DAY IN VITRO 03/21/16   [provider]  acetaminophen (TYLENOL) 500 MG tablet Take 1,000 mg by mouth every 6 (six) hours as needed for moderate pain.    [provider]  albuterol (ACCUNEB) 1.25 MG/3ML nebulizer solution Inhale 1 ampule into the lungs 3 (three) times daily as needed for wheezing or shortness of breath.  12/12/10   [provider]  albuterol (VENTOLIN HFA) 108 (90 Base) MCG/ACT inhaler Inhale 2 puffs into the lungs every 4 (four) hours as needed for wheezing or shortness of breath.  01/04/14   [provider]  aspirin 81 MG tablet Take 81  mg by mouth daily.    [provider]  atorvastatin (LIPITOR) 40 MG tablet Take 40 mg by mouth daily.    [provider]  BD PEN NEEDLE NANO U/F 32G X 4 MM MISC 2 (two) times daily. as directed 03/13/16   [provider]  carisoprodol (SOMA) 350 MG tablet Take 1 tablet (350 mg total) by mouth every 8 (eight) hours as needed for muscle spasms. 02/19/17   Meredith Staggers, MD  Cholecalciferol (VITAMIN D3) 2000 units capsule Take 2,000 Units by mouth daily.     [provider]  Dextromethorphan-Guaifenesin (ROBITUSSIN DM) 10-100 MG/5ML liquid Take 15 mLs by mouth every 12 (twelve) hours as needed.    [provider]  Fluticasone-Salmeterol (ADVAIR) 500-50 MCG/DOSE AEPB Inhale 1 puff into the lungs every 12 (twelve) hours.    [provider]  ibuprofen (ADVIL,MOTRIN) 800 MG tablet Take 1 tablet by mouth 3 (three) times daily as needed. 12/16/16   [provider]  insulin aspart (NOVOLOG FLEXPEN) 100 UNIT/ML FlexPen Inject 30 Units into the skin daily before supper. 07/26/16   [provider]  insulin glargine (LANTUS) 100 UNIT/ML injection Inject 150 Units into the skin at bedtime.     [provider]  lisinopril (PRINIVIL,ZESTRIL) 40 MG tablet Take 40 mg by mouth every evening.  02/29/16   [provider]  montelukast (SINGULAIR) 10 MG tablet Take 10 mg by mouth at bedtime.     [provider]  oxybutynin (DITROPAN) 5 MG tablet TAKE 1 TABLET BY MOUTH TWICE A DAY AS NEEDED FOR BLADDER 03/13/16   [provider]  Oxycodone HCl 10 MG TABS Take 1 tablet (10 mg total) by mouth every 8 (eight) hours as needed. Take one tablet twice a day as needed 02/19/17   Meredith Staggers, MD  predniSONE (DELTASONE) 20 MG tablet Take 1 tablet (20 mg total) by mouth as directed. Take 1 tab 3x daily for 4 days then 1 tab 2x daily for 4 days then 1 tab once daily for 4 days, then 1/2 tab daily for 4 days and off. 02/19/17    Meredith Staggers, MD  promethazine (PHENERGAN) 25 MG tablet Take 25 mg by mouth every 6 (six) hours as needed.     [provider]  sertraline (ZOLOFT) 100 MG tablet Take 200 mg by mouth daily.     [provider]  torsemide (DEMADEX) 20 MG tablet Take 20 mg by mouth daily.    [provider]  VICTOZA 18 MG/3ML SOPN INJECT 1.8ML SUBCUTANEOUSLY ONCE DAILY FOR 50 DAYS 03/17/16   [provider]    Family History Family History  Problem Relation Age of Onset  .  Hypertension Maternal Grandmother   . Stroke Maternal Grandmother   . Diabetes Maternal Grandfather   . Cancer Mother        lung  . Cancer Father   . Diabetes Sister   . Heart attack Neg Hx     Social History Social History  Substance Use Topics  . Smoking status: Never Smoker  . Smokeless tobacco: Never Used  . Alcohol use No     Allergies   Cafergot; Glucophage [metformin hcl]; and Canagliflozin   Review of Systems Review of Systems  Constitutional: Negative for fever.  Respiratory: Positive for shortness of breath. Negative for sputum production.   Cardiovascular: Negative for chest pain and leg swelling.  All other systems reviewed and are negative.    Physical Exam Updated Vital Signs BP (!) 102/55 (BP Location: Left Wrist)   Pulse (!) 101   Temp 98.1 F (36.7 C) (Oral)   Resp (!) 24   Ht 5\' 8"  (1.727 m)   Wt (!) 177.4 kg (391 lb)   LMP 03/07/2017   SpO2 100%   BMI 59.45 kg/m   Physical Exam  Constitutional: She is oriented to person, place, and time. She appears well-developed and well-nourished. No distress.  HENT:  Head: Normocephalic and atraumatic.  Neck: Normal range of motion. Neck supple.  Cardiovascular: Normal rate and regular rhythm.  Exam reveals no gallop and no friction rub.   No murmur heard. Pulmonary/Chest: Effort normal and breath sounds normal. No respiratory distress. She has no wheezes. She has no rales.  Abdominal: Soft. Bowel sounds  are normal. She exhibits no distension. There is no tenderness.  Musculoskeletal: Normal range of motion.  Neurological: She is alert and oriented to person, place, and time.  Skin: Skin is warm and dry. She is not diaphoretic.  Nursing note and vitals reviewed.    ED Treatments / Results  Labs (all labs ordered are listed, but only abnormal results are displayed) Labs Reviewed  [facility] PANEL  CBC WITH DIFFERENTIAL/PLATELET  D-DIMER, QUANTITATIVE (NOT AT [facility])  Randolm Idol, ED    EKG  EKG Interpretation None       Radiology Dg Chest 2 View  Result Date: 03/21/2017 CLINICAL DATA:  Short of breath for 2 weeks EXAM: CHEST  2 VIEW COMPARISON:  12/22/2016 FINDINGS: Normal heart size. Lungs clear. No pneumothorax. No pleural effusion. IMPRESSION: No active cardiopulmonary disease. Electronically Signed   By: Marybelle Killings M.D.   On: 03/21/2017 13:12    Procedures Procedures (including critical care time)  Medications Ordered in ED Medications  albuterol (PROVENTIL) (2.5 MG/3ML) 0.083% nebulizer solution 5 mg (5 mg Nebulization Given 03/21/17 1222)     Initial Impression / Assessment and Plan / ED Course  I have reviewed the triage vital signs and the nursing notes.  Pertinent labs & imaging results that were available during my care of the patient were reviewed by me and considered in my medical decision making (see chart for details).  Workup reveals positive CT scan for pulmonary emboli bilaterally. She does have right heart strain. This finding was discussed with critical care who does not feel as though the patient is a candidate for thrombolytics. The patient will be admitted to the hospitalist service under the care of Dr. Erlinda Hong.  CRITICAL CARE Performed by: Veryl Speak Total critical care time: 30 minutes Critical care time was exclusive of separately billable procedures and treating other patients. Critical care was  necessary to treat or  prevent imminent or life-threatening deterioration. Critical care was time spent personally by me on the following activities: development of treatment plan with patient and/or surrogate as well as nursing, discussions with consultants, evaluation of patient's response to treatment, examination of patient, obtaining history from patient or surrogate, ordering and performing treatments and interventions, ordering and review of laboratory studies, ordering and review of radiographic studies, pulse oximetry and re-evaluation of patient's condition.   Final Clinical Impressions(s) / ED Diagnoses   Final diagnoses:  Dyspnea    New Prescriptions New Prescriptions   No medications on file     Veryl Speak, MD 03/21/17 1558

## 2017-03-21 NOTE — H&P (Addendum)
History and Physical  Kristen [name] JJO:[id number] DOB: January 28, 1966 DOA: 03/21/2017  Referring physician: EDP PCP: Lawerance Cruel, MD   Chief Complaint: sob  HPI: Kristen [name] is a 51 y.o. female  with medical history significant of HTN, HLD, insulin dependent DM, asthma, morbid obesity( Body mass index is 59.45 kg/m.) sent from primary care physician's office to rule out PE, patient report exertional dyspneas and pain between her shoulder blade. She has no wheezing , no lower extremity edema on exam, due to concern of PE she is sent to [facility] ED, CTA confirmed submassive PE (bilateral with right heart strain), she is otherwise hemodynamically stable, troponin negative, EDP discussed with critical care who advised no need of thrombolytics and patient to admit to hospitalist service.  Patient denies fever, no wheezing, no edema, no n/v.     Review of Systems:  Detail per HPI, Review of systems are otherwise negative  Past Medical History:  Diagnosis Date  . Anxiety   . Asthma   . 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 Depression   . Diabetes mellitus   . Hypertension   . Migraines   . Near syncope   . Obesity   . PCOS (polycystic ovarian syndrome)   . Peripheral edema   . Sleep apnea    uses cpap set on 10  . 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 ABDOMINAL SURGERY     hernia repair  . CHOLECYSTECTOMY  [id number]  . COLONOSCOPY WITH PROPOFOL N/A 07/31/2016   Procedure: COLONOSCOPY WITH PROPOFOL;  Surgeon: Arta Silence, MD;  Location: [facility] ENDOSCOPY;  Service: Endoscopy;  Laterality: N/A;  . FOOT SURGERY Right 06/2007  . KNEE ARTHROSCOPY  09/2008   left  . KNEE ARTHROSCOPY  april 2011   left  . NASAL SINUS SURGERY    . NASAL SINUS SURGERY  1990  . SHOULDER SURGERY  2004   left   Social History:  reports that she has never smoked. She has never used smokeless tobacco. She reports that she does  not drink alcohol or use drugs. Patient lives at home & is able to participate in activities of daily living independently   Allergies  Allergen Reactions  . Cafergot Other (See Comments)    Chest pain; ergotamine-caffiene  . Glucophage [Metformin Hcl] Anaphylaxis  . Canagliflozin Swelling and Other (See Comments)    Legs Swell invokana Legs Swell    Family History  Problem Relation Age of Onset  . Hypertension Maternal Grandmother   . Stroke Maternal Grandmother   . Diabetes Maternal Grandfather   . Cancer Mother        lung  . Cancer Father   . Diabetes Sister   . Heart attack Neg Hx       Prior to Admission medications   Medication Sig Start Date End Date Taking? Authorizing Provider  albuterol (ACCUNEB) 1.25 MG/3ML nebulizer solution Inhale 1 ampule into the lungs 3 (three) times daily as needed for wheezing or shortness of breath.  12/12/10  Yes [provider]  albuterol (VENTOLIN HFA) 108 (90 Base) MCG/ACT inhaler Inhale 2 puffs into the lungs every 4 (four) hours as needed for wheezing or shortness of breath.  01/04/14  Yes [provider]  aspirin 81 MG tablet Take 81 mg by mouth daily.   Yes [provider]  atorvastatin (LIPITOR) 40 MG tablet Take  40 mg by mouth daily.   Yes [provider]  carisoprodol (SOMA) 350 MG tablet Take 1 tablet (350 mg total) by mouth every 8 (eight) hours as needed for muscle spasms. 02/19/17  Yes Meredith Staggers, MD  Cholecalciferol (VITAMIN D3) 2000 units capsule Take 2,000 Units by mouth daily.    Yes [provider]  Fluticasone-Salmeterol (ADVAIR) 500-50 MCG/DOSE AEPB Inhale 1 puff into the lungs every 12 (twelve) hours.   Yes [provider]  ibuprofen (ADVIL,MOTRIN) 800 MG tablet Take 1 tablet by mouth 3 (three) times daily as needed. 12/16/16  Yes [provider]  insulin aspart (NOVOLOG FLEXPEN) 100 UNIT/ML FlexPen Inject 30 Units into the skin 3 (three) times daily with  meals.  07/26/16  Yes [provider]  lisinopril (PRINIVIL,ZESTRIL) 40 MG tablet Take 40 mg by mouth every evening.  02/29/16  Yes [provider]  oxybutynin (DITROPAN) 5 MG tablet TAKE 1 TABLET BY MOUTH TWICE A DAY AS NEEDED FOR BLADDER 03/13/16  Yes [provider]  Oxycodone HCl 10 MG TABS Take 1 tablet (10 mg total) by mouth every 8 (eight) hours as needed. Take one tablet twice a day as needed 02/19/17  Yes Meredith Staggers, MD  promethazine (PHENERGAN) 25 MG tablet Take 25 mg by mouth every 6 (six) hours as needed.    Yes [provider]  sertraline (ZOLOFT) 100 MG tablet Take 200 mg by mouth daily.    Yes [provider]  torsemide (DEMADEX) 20 MG tablet Take 20 mg by mouth daily.   Yes [provider]  VICTOZA 18 MG/3ML SOPN INJECT 1.8ML SUBCUTANEOUSLY ONCE DAILY FOR 30 DAYS 03/17/16  Yes [provider]  [place] test strip USE AS DIRECTED TWICE A DAY IN VITRO 03/21/16   [provider]  BD PEN NEEDLE NANO U/F 32G X 4 MM MISC 2 (two) times daily. as directed 03/13/16   [provider]  insulin glargine (LANTUS) 100 UNIT/ML injection Inject 80-100 Units into the skin at bedtime.     [provider]  montelukast (SINGULAIR) 10 MG tablet Take 10 mg by mouth at bedtime.     [provider]    Physical Exam: BP 130/68   Pulse 82   Temp 98.1 F (36.7 C) (Oral)   Resp 19   Ht 5\' 8"  (1.727 m)   Wt (!) 177.4 kg (391 lb)   LMP 03/07/2017   SpO2 96%   BMI 59.45 kg/m   General:  Obese, NAD Eyes: PERRL ENT: unremarkable Neck: supple, no JVD Cardiovascular: RRR Respiratory: CTABL Abdomen: soft/ND/ND, positive bowel sounds Skin: no rash Musculoskeletal:  No edema Psychiatric: calm/cooperative Neurologic: no focal findings            Labs on Admission:  Basic Metabolic Panel:  Recent Labs Lab 03/21/17 1348  NA 135  K 4.6  CL 108  CO2 23  GLUCOSE 221*  BUN 23*    CREATININE 1.26*  CALCIUM 8.1*   Liver Function Tests:  Recent Labs Lab 03/21/17 1348  AST 33  ALT 33  ALKPHOS 71  BILITOT 0.3  PROT 6.0*  ALBUMIN 2.8*   No results for input(s): LIPASE, AMYLASE in the last 168 hours. No results for input(s): AMMONIA in the last 168 hours. CBC:  Recent Labs Lab 03/21/17 1348  WBC 14.0*  NEUTROABS 3.4  HGB 11.9*  HCT 36.5  MCV 90.6  PLT 156   Cardiac Enzymes: No results for input(s):  CKTOTAL, CKMB, CKMBINDEX, TROPONINI in the last 168 hours.  BNP (last 3 results)  Recent Labs  03/21/17 1348  BNP 27.2    ProBNP (last 3 results) No results for input(s): PROBNP in the last 8760 hours.  CBG: No results for input(s): GLUCAP in the last 168 hours.  Radiological Exams on Admission: Dg Chest 2 View  Result Date: 03/21/2017 CLINICAL DATA:  Short of breath for 2 weeks EXAM: CHEST  2 VIEW COMPARISON:  12/22/2016 FINDINGS: Normal heart size. Lungs clear. No pneumothorax. No pleural effusion. IMPRESSION: No active cardiopulmonary disease. Electronically Signed   By: Marybelle Killings M.D.   On: 03/21/2017 13:12   Ct Angio Chest Pe W Or Wo Contrast  Result Date: 03/21/2017 CLINICAL DATA:  Insertional back pain and dyspnea x2 weeks. EXAM: CT ANGIOGRAPHY CHEST WITH CONTRAST TECHNIQUE: Multidetector CT imaging of the chest was performed using the standard protocol during bolus administration of intravenous contrast. Multiplanar CT image reconstructions and MIPs were obtained to evaluate the vascular anatomy. CONTRAST:  58 cc Isovue-300 COMPARISON:  12/22/2016 chest CT FINDINGS: Cardiovascular: Acute bilateral lower lobe segmental and subsegmental pulmonary emboli. RV/ LV ratio equals 1.07 consistent with right heart strain. Heart is normal in size. No aortic aneurysm or dissection. No pericardial effusion. Mediastinum/Nodes: No enlarged mediastinal, hilar, or axillary lymph nodes. Thyroid gland, trachea, and esophagus demonstrate no significant  findings. Lungs/Pleura: Ground-glass opacities within both lungs consistent with hypoventilatory change. Sequela of mild interstitial edema not entirely excluded. New pleural-based opacity in the periphery of the left lower lobe series 12, image 36 likely represents a new focus of atelectasis or postinflammatory change. Tiny pulmonary infarct is within differential possibilities given current findings believed less likely given somewhat more ovoid appearance along its caudal aspect. Stable 3 mm or less nodular opacities are noted in both lower lobes and right middle lobe unchanged in appearance. Upper Abdomen: No space-occupying mass of the liver. The spleen appears mildly enlarged to the extent visualized, measuring up to 18.9 cm in AP dimension. Musculoskeletal: No chest wall abnormality. No acute or suspicious osseous abnormalities. Degenerative changes are seen along the dorsal spine. Review of the MIP images confirms the above findings. IMPRESSION: 1. Positive for acute PE with CT evidence of right heart strain (RV/LV Ratio = 1.07) consistent with at least submassive (intermediate risk) PE. The presence of right heart strain has been associated with an increased risk of morbidity and mortality. Please activate Code PE by paging [phone]. Critical Value/emergent results were called by telephone at the time of interpretation on 03/21/2017 at 3:23 pm to Dr. Veryl Speak , who verbally acknowledged these results. 2. Stable bilobed lateral lower lobe and right middle lobe 3 mm or less pulmonary nodules. New focus of opacity along the periphery of the left lower lobe most likely represents a focus of atelectasis. Tiny pulmonary infarct is not entirely excluded but believed less likely. 3. Splenomegaly Electronically Signed   By: Ashley Royalty M.D.   On: 03/21/2017 15:29    Assessment/Plan Present on Admission: . Pulmonary embolism ([place]) . Pulmonary emboli (HCC)  Submassive PE with right heart strain: --CTA:  Acute bilateral lower lobe segmental and subsegmental pulmonary emboli ---patient denies family history of clotting disorders, she does not smoke, not on hormonal therapy, no recent long travel history, she report she is uptodate with mamogram/colonoscopy and pap smear. She does has morbid obesity, baseline sedentary. --admit to stepdown, continue heparin drip that is started in the ED, will check Bilateral lower extremity  venous doppler to r/o DVT, will get echocardiogram  ARF on CKDIII,  will get UA. Hold demadex, repeat lab in am, renal dosing   Insulin dependent DM2, hold victoza, check a1c, continue insulin, adjust as needed  HTN; currently bp low normal. Hold lisinopril and demadex for now.  Asthma: stable, no wheezing, continue home meds  Morbid obesity: life style modification  Splenomegaly: incidental finding on CTA chest, patient denies symptom, no thrombocytopenia, will get ct ab/pel.  DVT prophylaxis: on heparin drip  Consultants: EDP discussed case with critical care  Code Status: full   Family Communication:  Patient   Disposition Plan: admit to stepdown  Time spent:   Lamesha Tibbits MD, PhD [facility] Pager [phone] If 7PM-7AM, please contact night-coverage at [email or web address], password [facility]

## 2017-03-21 NOTE — Progress Notes (Signed)
eLink Physician-Brief Progress Note Patient Name: SIERRIA BRUNEY DOB: 13-Dec-1965 MRN: [id number]   Date of Service  03/21/2017  HPI/Events of Note  Called by EDP for code PE Chart reviewed sPESI score is 0 BP and sats are stable  BNP, Troponin- negative. EKG with no right heart strain CTA shows segmental and subsegmental PEs with no central clot  eICU Interventions  Continue heparin No role for TPA or EKOS at present [facility] for admission to hospitalist service. PCCM will be available as needed     Intervention Category Major Interventions: Other:  Elion Hocker 03/21/2017, 4:09 PM

## 2017-03-21 NOTE — Progress Notes (Signed)
ANTICOAGULATION CONSULT NOTE - Initial Consult  Pharmacy Consult for IV Heparin Indication: pulmonary embolus  Allergies  Allergen Reactions  . Cafergot Other (See Comments)    Chest pain; ergotamine-caffiene  . Glucophage [Metformin Hcl] Anaphylaxis  . Canagliflozin Swelling and Other (See Comments)    Legs Swell invokana Legs Swell    Patient Measurements: Height: 5\' 8"  (172.7 cm) Weight: (!) 391 lb (177.4 kg) IBW/kg (Calculated) : 63.9 Heparin Dosing Weight: 111.2 kg  Vital Signs: Temp: 98.1 F (36.7 C) (06/01 1204) Temp Source: Oral (06/01 1204) BP: 102/55 (06/01 1204) Pulse Rate: 101 (06/01 1204)  Labs:  Recent Labs  03/21/17 1348  HGB 11.9*  HCT 36.5  PLT 156  CREATININE 1.26*    Estimated Creatinine Clearance: 91.1 mL/min (A) (by C-G formula based on SCr of 1.26 mg/dL (H)).   Medical History: Past Medical History:  Diagnosis Date  . Anxiety   . Asthma   . 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 Depression   . Diabetes mellitus   . Hypertension   . Migraines   . Near syncope   . Obesity   . PCOS (polycystic ovarian syndrome)   . Peripheral edema   . Sleep apnea    uses cpap set on 10  . Stroke ([place]) 12/2011   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     Medications:  Scheduled:  . heparin  5,500 Units Intravenous Once    Assessment: Pharmacy is consulted to dose heparin drip in  51 yo female diagnosed with PE. Pt was directed to ED by PCP. Pt with PMH of  morbid obesity, hypertension, diabetes asthma and chronic back pain. PCCM states no right heart strain. CTA shows segmental and subsegmental PR with no central clots.   Baseline Labs:   PT/INR pending  Scr 1.26  Hgb 11.9, Hct 36.5, plt 156    Goal of Therapy:  Heparin level 0.3-0.7 units/ml Monitor platelets by anticoagulation protocol: Yes   Plan:   Heparin 5500 unit bolus, then 1800 units/hr.  HL 6 hours after start of heparin drip  DHL, daily CBC while on heparin  Monitor for  signs and symptoms of bleeding   Royetta Asal, PharmD, BCPS Pager [phone] 03/21/2017 5:03 PM

## 2017-03-22 ENCOUNTER — Inpatient Hospital Stay (HOSPITAL_COMMUNITY): Payer: Medicare HMO

## 2017-03-22 DIAGNOSIS — I2609 Other pulmonary embolism with acute cor pulmonale: Secondary | ICD-10-CM

## 2017-03-22 DIAGNOSIS — Z86711 Personal history of pulmonary embolism: Secondary | ICD-10-CM

## 2017-03-22 DIAGNOSIS — I2699 Other pulmonary embolism without acute cor pulmonale: Secondary | ICD-10-CM

## 2017-03-22 DIAGNOSIS — R161 Splenomegaly, not elsewhere classified: Secondary | ICD-10-CM

## 2017-03-22 LAB — COMPREHENSIVE METABOLIC PANEL
ALT: 32 U/L (ref 14–54)
AST: 29 U/L (ref 15–41)
Albumin: 2.6 g/dL — ABNORMAL LOW (ref 3.5–5.0)
Alkaline Phosphatase: 61 U/L (ref 38–126)
Anion gap: 5 (ref 5–15)
BUN: 16 mg/dL (ref 6–20)
CO2: 22 mmol/L (ref 22–32)
Calcium: 8.3 mg/dL — ABNORMAL LOW (ref 8.9–10.3)
Chloride: 110 mmol/L (ref 101–111)
Creatinine, Ser: 1.05 mg/dL — ABNORMAL HIGH (ref 0.44–1.00)
GFR calc Af Amer: >60 mL/min (ref >60)
GFR calc non Af Amer: >60 mL/min (ref >60)
Glucose, Bld: 158 mg/dL — ABNORMAL HIGH (ref 65–99)
Potassium: 4 mmol/L (ref 3.5–5.1)
Sodium: 137 mmol/L (ref 135–145)
Total Bilirubin: 0.6 mg/dL (ref 0.3–1.2)
Total Protein: 5.5 g/dL — ABNORMAL LOW (ref 6.5–8.1)

## 2017-03-22 LAB — GLUCOSE, CAPILLARY
Glucose-Capillary: 135 mg/dL — ABNORMAL HIGH (ref 65–99)
Glucose-Capillary: 170 mg/dL — ABNORMAL HIGH (ref 65–99)
Glucose-Capillary: 103 mg/dL — ABNORMAL HIGH (ref 65–99)
Glucose-Capillary: 61 mg/dL — ABNORMAL LOW (ref 65–99)
Glucose-Capillary: 177 mg/dL — ABNORMAL HIGH (ref 65–99)

## 2017-03-22 LAB — CBC
HCT: 35 % — ABNORMAL LOW (ref 36.0–46.0)
Hemoglobin: 11.5 g/dL — ABNORMAL LOW (ref 12.0–15.0)
MCH: 29.9 pg (ref 26.0–34.0)
MCHC: 32.9 g/dL (ref 30.0–36.0)
MCV: 91.1 fL (ref 78.0–100.0)
Platelets: 152 10*3/uL (ref 150–400)
RBC: 3.84 MIL/uL — ABNORMAL LOW (ref 3.87–5.11)
RDW: 16.1 % — ABNORMAL HIGH (ref 11.5–15.5)
WBC: 8.6 10*3/uL (ref 4.0–10.5)

## 2017-03-22 LAB — HIV ANTIBODY (ROUTINE TESTING W REFLEX): HIV Screen 4th Generation wRfx: NONREACTIVE

## 2017-03-22 LAB — HEPARIN LEVEL (UNFRACTIONATED)
Heparin Unfractionated: 0.34 IU/mL (ref 0.30–0.70)
Heparin Unfractionated: 0.41 IU/mL (ref 0.30–0.70)

## 2017-03-22 LAB — PROTIME-INR
INR: 1.12
Prothrombin Time: 14.5 seconds (ref 11.4–15.2)

## 2017-03-22 LAB — MRSA PCR SCREENING: MRSA by PCR: NEGATIVE

## 2017-03-22 LAB — TSH: TSH: 2.997 u[IU]/mL (ref 0.350–4.500)

## 2017-03-22 LAB — ECHOCARDIOGRAM COMPLETE
Height: 68 in
Weight: 6243.43 oz

## 2017-03-22 MED ORDER — ORAL CARE MOUTH RINSE
15.0000 mL | Freq: Two times a day (BID) | OROMUCOSAL | Status: DC
  Administered 2017-03-22 – 2017-03-23 (×2): 15 mL via OROMUCOSAL

## 2017-03-22 MED ORDER — PERFLUTREN LIPID MICROSPHERE
1.0000 mL | INTRAVENOUS | Status: AC | PRN
  Administered 2017-03-22: 4 mL via INTRAVENOUS
  Filled 2017-03-22: qty 10

## 2017-03-22 MED ORDER — ALBUTEROL SULFATE (2.5 MG/3ML) 0.083% IN NEBU: 2.5000 mg | INHALATION_SOLUTION | RESPIRATORY_TRACT | Status: DC | PRN

## 2017-03-22 MED ORDER — POLYETHYLENE GLYCOL 3350 17 G PO PACK
17.0000 g | PACK | Freq: Every day | ORAL | Status: DC
  Filled 2017-03-22: qty 1

## 2017-03-22 MED ORDER — INSULIN ASPART 100 UNIT/ML ~~LOC~~ SOLN
15.0000 [IU] | Freq: Three times a day (TID) | SUBCUTANEOUS | Status: DC
  Administered 2017-03-23 – 2017-03-24 (×5): 15 [IU] via SUBCUTANEOUS

## 2017-03-22 MED ORDER — HEPARIN (PORCINE) IN NACL 100-0.45 UNIT/ML-% IJ SOLN
2100.0000 [IU]/h | INTRAMUSCULAR | Status: AC
  Administered 2017-03-22 (×2): 1900 [IU]/h via INTRAVENOUS
  Administered 2017-03-23: 2100 [IU]/h via INTRAVENOUS
  Administered 2017-03-23: 1900 [IU]/h via INTRAVENOUS
  Administered 2017-03-24: 2100 [IU]/h via INTRAVENOUS
  Filled 2017-03-22 (×6): qty 250

## 2017-03-22 MED ORDER — OXYCODONE HCL 5 MG PO TABS
5.0000 mg | ORAL_TABLET | Freq: Once | ORAL | Status: DC
  Filled 2017-03-22: qty 1

## 2017-03-22 MED ORDER — SENNOSIDES-DOCUSATE SODIUM 8.6-50 MG PO TABS
1.0000 | ORAL_TABLET | Freq: Two times a day (BID) | ORAL | Status: DC
  Administered 2017-03-22 – 2017-03-23 (×3): 1 via ORAL
  Filled 2017-03-22 (×3): qty 1

## 2017-03-22 NOTE — Progress Notes (Signed)
  Echocardiogram 2D Echocardiogram with definity has been performed.  Darlina Sicilian M 03/22/2017, 9:59 AM

## 2017-03-22 NOTE — Progress Notes (Signed)
*  PRELIMINARY RESULTS* Vascular Ultrasound Lower extremity venous duplex has been completed.  Preliminary findings: Technically limited due to body habitus. Very poor visualization of veins, however there is DVT noted in the Right popliteal vein.    Landry Mellow, RDMS, RVT  03/22/2017, 11:09 AM

## 2017-03-22 NOTE — Progress Notes (Signed)
Hypoglycemic Event  CBG: 61  Treatment: 15 GM carbohydrate snack  Symptoms: Hungry  Follow-up CBG: Time:1725 CBG Result:103  Possible Reasons for Event: Inadequate meal intake  Comments/MD notified:    Ezequiel Essex

## 2017-03-22 NOTE — Progress Notes (Signed)
PROGRESS NOTE  Kristen [name] XLK:[id number] DOB: 1966-08-13 DOA: 03/21/2017 PCP: Lawerance Cruel, MD  HPI/Recap of past 24 hours:  Feeling better, less pain, on room air. No cough  Assessment/Plan: Active Problems:   Pulmonary embolism (HCC)   Pulmonary emboli (HCC)   Submassive PE with right heart strain: --CTA: Acute bilateral lower lobe segmental and subsegmental pulmonary emboli ---patient denies family history of clotting disorders, she does not smoke, not on hormonal therapy, no recent long travel history, she report she is uptodate with mamogram/colonoscopy and pap smear. She does has morbid obesity, baseline sedentary.  --she is started on heparin drip and admitted to stepdown, improving, hemodynamically stable, transfer to med tele on 6/2 -- Bilateral lower extremity venous doppler showed right lower extremity DVT,  Echocardiogram lvef 60%, grade II diastolic dysfunction, right ventricle cavity size mildly dilated. -I have discussed with patient about choice of long term anticoagulation, she prefer NOAC, consider transition to xarelto on 6/3  ARF on CKDIII,  Cr 1.26 on admission, cr 1.05 today  UA unremarkable. Hold demadex, hold acei, , renal dosing   Insulin dependent DM2, hold victoza, a1c pending, continue insulin, adjust as needed  HTN;  bp low normal on presentation, lisinopril and demadex held since admission, likely able to resume on 6/3  Asthma: stable, no wheezing, continue home meds  Morbid obesity: life style modification  Splenomegaly: incidental finding on CTA chest, patient denies symptom, no thrombocytopenia, ct ab/pel liver unremarkable, will repeat initial cbc diff showed lymphocytosis?, repeat cbc,  LDH likely will be high in the setting of acute PE., HIV negative, consider hematology consult   DVT prophylaxis: on heparin drip  Consultants: EDP discussed case with critical care  Code Status: full   Family Communication:  Patient  and family members at bedside  Disposition Plan: home, likely on 6/3   Consultants:  none  Procedures:  none  Antibiotics:  none   Objective: BP (!) 116/50   Pulse 80   Temp 97.8 F (36.6 C) (Oral)   Resp 20   Ht 5\' 8"  (1.727 m)   Wt (!) 177 kg (390 lb 3.4 oz)   LMP 03/07/2017   SpO2 97%   BMI 59.33 kg/m   Intake/Output Summary (Last 24 hours) at 03/22/17 1011 Last data filed at 03/22/17 0400  Gross per 24 hour  Intake             66.5 ml  Output                0 ml  Net             66.5 ml   Filed Weights   03/21/17 1204 03/22/17 0354  Weight: (!) 177.4 kg (391 lb) (!) 177 kg (390 lb 3.4 oz)    Exam:   General:  Obese, NAD  Cardiovascular: RRR  Respiratory: CTABL  Abdomen: Soft/ND/NT, positive BS  Musculoskeletal: No Edema  Neuro: aaox3  Data Reviewed: Basic Metabolic Panel:  Recent Labs Lab 03/21/17 1348 03/22/17 0740  NA 135 137  K 4.6 4.0  CL 108 110  CO2 23 22  GLUCOSE 221* 158*  BUN 23* 16  CREATININE 1.26* 1.05*  CALCIUM 8.1* 8.3*   Liver Function Tests:  Recent Labs Lab 03/21/17 1348 03/22/17 0740  AST 33 29  ALT 33 32  ALKPHOS 71 61  BILITOT 0.3 0.6  PROT 6.0* 5.5*  ALBUMIN 2.8* 2.6*   No results for input(s): LIPASE, AMYLASE in the last  168 hours. No results for input(s): AMMONIA in the last 168 hours. CBC:  Recent Labs Lab 03/21/17 1348 03/22/17 0740  WBC 14.0* 8.6  NEUTROABS 3.4  --   HGB 11.9* 11.5*  HCT 36.5 35.0*  MCV 90.6 91.1  PLT 156 152   Cardiac Enzymes:   No results for input(s): CKTOTAL, CKMB, CKMBINDEX, TROPONINI in the last 168 hours. BNP (last 3 results)  Recent Labs  03/21/17 1348  BNP 27.2    ProBNP (last 3 results) No results for input(s): PROBNP in the last 8760 hours.  CBG:  Recent Labs Lab 03/21/17 2155 03/22/17 0735  GLUCAP 204* 135*    Recent Results (from the past 240 hour(s))  MRSA PCR Screening     Status: None   Collection Time: 03/22/17  4:10 AM  Result  Value Ref Range Status   MRSA by PCR NEGATIVE NEGATIVE Final    Comment:        The GeneXpert MRSA Assay (FDA approved for NASAL specimens only), is one component of a comprehensive MRSA colonization surveillance program. It is not intended to diagnose MRSA infection nor to guide or monitor treatment for MRSA infections.      Studies: Ct Abdomen Pelvis Wo Contrast  Result Date: 03/21/2017 CLINICAL DATA:  Splenomegaly EXAM: CT ABDOMEN AND PELVIS WITHOUT CONTRAST TECHNIQUE: Multidetector CT imaging of the abdomen and pelvis was performed following the standard protocol without IV contrast. COMPARISON:  CT 03/21/2017 FINDINGS: Lower chest: Small foci of nodular atelectasis in the sub pleural left lower lobe anteriorly. No consolidation or pleural effusion. The heart is nonenlarged. Hepatobiliary: No focal liver abnormality is seen. Status post cholecystectomy. No biliary dilatation. Pancreas: Unremarkable. No pancreatic ductal dilatation or surrounding inflammatory changes. Spleen: Enlarged, measuring 19 cm. Adrenals/Urinary Tract: Adrenal glands are unremarkable. Kidneys are normal, without renal calculi, focal lesion, or hydronephrosis. Bladder is unremarkable. Excreted contrast in the collecting systems and bladder. Stomach/Bowel: Stomach is within normal limits. Appendix appears normal. No evidence of bowel wall thickening, distention, or inflammatory changes. Sigmoid colon diverticula without acute inflammation. Vascular/Lymphatic: Aortic atherosclerosis. No enlarged abdominal or pelvic lymph nodes. Reproductive: Uterus and bilateral adnexa are unremarkable. Other: Diastasis of the rectus with small ventral defect and fatty hernia. No free air or free fluid Musculoskeletal: Trace retrolisthesis of L5 on S1. Lucent lesion in L1, unchanged compared with MRI 12/09/2016, and corresponding to T1 bright lesion, possible hemangioma. Superior endplate irregularity at T11 with Schmorl's node. IMPRESSION:  1. Splenomegaly up to 19 cm.  No focal splenic abnormality. 2. No acute intra-abdominal or pelvic pathology 3. Sigmoid colon diverticula without acute inflammation Electronically Signed   By: Donavan Foil M.D.   On: 03/21/2017 19:31   Dg Chest 2 View  Result Date: 03/21/2017 CLINICAL DATA:  Short of breath for 2 weeks EXAM: CHEST  2 VIEW COMPARISON:  12/22/2016 FINDINGS: Normal heart size. Lungs clear. No pneumothorax. No pleural effusion. IMPRESSION: No active cardiopulmonary disease. Electronically Signed   By: Marybelle Killings M.D.   On: 03/21/2017 13:12   Ct Angio Chest Pe W Or Wo Contrast  Result Date: 03/21/2017 CLINICAL DATA:  Insertional back pain and dyspnea x2 weeks. EXAM: CT ANGIOGRAPHY CHEST WITH CONTRAST TECHNIQUE: Multidetector CT imaging of the chest was performed using the standard protocol during bolus administration of intravenous contrast. Multiplanar CT image reconstructions and MIPs were obtained to evaluate the vascular anatomy. CONTRAST:  58 cc Isovue-300 COMPARISON:  12/22/2016 chest CT FINDINGS: Cardiovascular: Acute bilateral lower lobe segmental and subsegmental  pulmonary emboli. RV/ LV ratio equals 1.07 consistent with right heart strain. Heart is normal in size. No aortic aneurysm or dissection. No pericardial effusion. Mediastinum/Nodes: No enlarged mediastinal, hilar, or axillary lymph nodes. Thyroid gland, trachea, and esophagus demonstrate no significant findings. Lungs/Pleura: Ground-glass opacities within both lungs consistent with hypoventilatory change. Sequela of mild interstitial edema not entirely excluded. New pleural-based opacity in the periphery of the left lower lobe series 12, image 36 likely represents a new focus of atelectasis or postinflammatory change. Tiny pulmonary infarct is within differential possibilities given current findings believed less likely given somewhat more ovoid appearance along its caudal aspect. Stable 3 mm or less nodular opacities are noted  in both lower lobes and right middle lobe unchanged in appearance. Upper Abdomen: No space-occupying mass of the liver. The spleen appears mildly enlarged to the extent visualized, measuring up to 18.9 cm in AP dimension. Musculoskeletal: No chest wall abnormality. No acute or suspicious osseous abnormalities. Degenerative changes are seen along the dorsal spine. Review of the MIP images confirms the above findings. IMPRESSION: 1. Positive for acute PE with CT evidence of right heart strain (RV/LV Ratio = 1.07) consistent with at least submassive (intermediate risk) PE. The presence of right heart strain has been associated with an increased risk of morbidity and mortality. Please activate Code PE by paging [phone]. Critical Value/emergent results were called by telephone at the time of interpretation on 03/21/2017 at 3:23 pm to Dr. Veryl Speak , who verbally acknowledged these results. 2. Stable bilobed lateral lower lobe and right middle lobe 3 mm or less pulmonary nodules. New focus of opacity along the periphery of the left lower lobe most likely represents a focus of atelectasis. Tiny pulmonary infarct is not entirely excluded but believed less likely. 3. Splenomegaly Electronically Signed   By: Ashley Royalty M.D.   On: 03/21/2017 15:29    Scheduled Meds: . aspirin  81 mg Oral Daily  . atorvastatin  40 mg Oral Daily  . cholecalciferol  2,000 Units Oral Daily  . insulin aspart  0-20 Units Subcutaneous TID WC  . insulin aspart  30 Units Subcutaneous TID WC  . insulin glargine  80 Units Subcutaneous QHS  . mouth rinse  15 mL Mouth Rinse BID  . mometasone-formoterol  2 puff Inhalation BID  . montelukast  10 mg Oral QHS  . oxyCODONE  5 mg Oral Once  . polyethylene glycol  17 g Oral Daily  . senna-docusate  1 tablet Oral BID  . sertraline  200 mg Oral Daily  . sodium chloride flush  3 mL Intravenous Q12H    Continuous Infusions: . heparin 1,900 Units/hr (03/22/17 0400)     Time spent:  69mins  Lyndsay Talamante MD, PhD  [facility] Pager [phone]. If 7PM-7AM, please contact night-coverage at [email or web address], password [facility] 03/22/2017, 10:11 AM  LOS: 1 day

## 2017-03-22 NOTE — Progress Notes (Signed)
ANTICOAGULATION CONSULT NOTE - f/u Consult  Pharmacy Consult for IV Heparin Indication: pulmonary embolus  Allergies  Allergen Reactions  . Cafergot Other (See Comments)    Chest pain; ergotamine-caffiene  . Glucophage [Metformin Hcl] Anaphylaxis  . Canagliflozin Swelling and Other (See Comments)    Legs Swell invokana Legs Swell    Patient Measurements: Height: 5\' 8"  (172.7 cm) Weight: (!) 390 lb 3.4 oz (177 kg) IBW/kg (Calculated) : 63.9 Heparin Dosing Weight: 111.2 kg  Vital Signs: Temp: 97.8 F (36.6 C) (06/02 0800) Temp Source: Oral (06/02 0800) BP: 116/50 (06/02 0500) Pulse Rate: 80 (06/02 0500)  Labs:  Recent Labs  03/21/17 1348 03/21/17 2340 03/22/17 0740  HGB 11.9*  --  11.5*  HCT 36.5  --  35.0*  PLT 156  --  152  LABPROT  --   --  14.5  INR  --   --  1.12  HEPARINUNFRC  --  0.34 0.41  CREATININE 1.26*  --  1.05*    Estimated Creatinine Clearance: 109.2 mL/min (A) (by C-G formula based on SCr of 1.05 mg/dL (H)).   Medical History: Past Medical History:  Diagnosis Date  . Anxiety   . Asthma   . 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 Depression   . Diabetes mellitus   . Hypertension   . Migraines   . Near syncope   . Obesity   . PCOS (polycystic ovarian syndrome)   . Peripheral edema   . Sleep apnea    uses cpap set on 10  . 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 aspirin  81 mg Oral Daily  . atorvastatin  40 mg Oral Daily  . cholecalciferol  2,000 Units Oral Daily  . insulin aspart  0-20 Units Subcutaneous TID WC  . insulin aspart  30 Units Subcutaneous TID WC  . insulin glargine  80 Units Subcutaneous QHS  . mouth rinse  15 mL Mouth Rinse BID  . mometasone-formoterol  2 puff Inhalation BID  . montelukast  10 mg Oral QHS  . oxyCODONE  5 mg Oral Once  . sertraline  200 mg Oral Daily  . sodium chloride flush  3 mL Intravenous Q12H    Assessment: Pharmacy is consulted to dose heparin  drip in 51 yo female diagnosed with PE. Pt was directed to ED by PCP. Pt with PMH of morbid obesity, hypertension, diabetes, asthma, and chronic back pain. PCCM states no right heart strain. CTA shows segmental and subsegmental PR with no central clots.   Baseline Labs: PT/INR wnl, Scr elevated at 1.26, Hgb 11.9, Hct 36.5, plt 156  6/2:   Heparin level in therapeutic range on 1900units/hr.  No infusion or bleeding issues per RN.  SCr improved.  On aspirin 81mg  daily continued from home.   Goal of Therapy:  Heparin level 0.3-0.7 units/ml Monitor platelets by anticoagulation protocol: Yes   Plan:   Continue heparin drip at 1900units/hr  Daily heparin level   Daily CBC while on heparin  Monitor for signs and symptoms of bleeding  F/u plan for long-term anticoagulation  Romeo Rabon, PharmD, pager [phone]. 03/22/2017,8:58 AM.

## 2017-03-22 NOTE — Progress Notes (Signed)
ANTICOAGULATION CONSULT NOTE - f/u Consult  Pharmacy Consult for IV Heparin Indication: pulmonary embolus  Allergies  Allergen Reactions  . Cafergot Other (See Comments)    Chest pain; ergotamine-caffiene  . Glucophage [Metformin Hcl] Anaphylaxis  . Canagliflozin Swelling and Other (See Comments)    Legs Swell invokana Legs Swell    Patient Measurements: Height: 5\' 8"  (172.7 cm) Weight: (!) 391 lb (177.4 kg) IBW/kg (Calculated) : 63.9 Heparin Dosing Weight: 111.2 kg  Vital Signs: BP: 127/66 (06/01 2327) Pulse Rate: 84 (06/01 2327)  Labs:  Recent Labs  03/21/17 1348 03/21/17 2340  HGB 11.9*  --   HCT 36.5  --   PLT 156  --   HEPARINUNFRC  --  0.34  CREATININE 1.26*  --     Estimated Creatinine Clearance: 91.1 mL/min (A) (by C-G formula based on SCr of 1.26 mg/dL (H)).   Medical History: Past Medical History:  Diagnosis Date  . Anxiety   . Asthma   . 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 Depression   . Diabetes mellitus   . Hypertension   . Migraines   . Near syncope   . Obesity   . PCOS (polycystic ovarian syndrome)   . Peripheral edema   . Sleep apnea    uses cpap set on 10  . 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 aspirin  81 mg Oral Daily  . atorvastatin  40 mg Oral Daily  . insulin aspart  0-20 Units Subcutaneous TID WC  . insulin aspart  30 Units Subcutaneous TID WC  . insulin glargine  80 Units Subcutaneous QHS  . mometasone-formoterol  2 puff Inhalation BID  . montelukast  10 mg Oral QHS  . sertraline  200 mg Oral Daily  . sodium chloride flush  3 mL Intravenous Q12H  . Vitamin D3  2,000 Units Oral Daily    Assessment: Pharmacy is consulted to dose heparin drip in  51 yo female diagnosed with PE. Pt was directed to ED by PCP. Pt with PMH of  morbid obesity, hypertension, diabetes asthma and chronic back pain. PCCM states no right heart strain. CTA shows segmental and subsegmental PR with  no central clots.   Baseline Labs:   PT/INR pending  Scr 1.26  Hgb 11.9, Hct 36.5, plt 156 6/1  2340 HL=0.34 at low end of therapeutic, no infusion or bleeding issues per RN    Goal of Therapy:  Heparin level 0.3-0.7 units/ml Monitor platelets by anticoagulation protocol: Yes   Plan:   Increase heparin drip to 1900 units/hr to ensure stays in therapeutic range  HL 6 hours after drip increase  DHL, daily CBC while on heparin  Monitor for signs and symptoms of bleeding   [name] 03/22/2017 12:29 AM

## 2017-03-23 DIAGNOSIS — R918 Other nonspecific abnormal finding of lung field: Secondary | ICD-10-CM

## 2017-03-23 DIAGNOSIS — I10 Essential (primary) hypertension: Secondary | ICD-10-CM

## 2017-03-23 DIAGNOSIS — E1165 Type 2 diabetes mellitus with hyperglycemia: Secondary | ICD-10-CM

## 2017-03-23 DIAGNOSIS — D72820 Lymphocytosis (symptomatic): Secondary | ICD-10-CM

## 2017-03-23 DIAGNOSIS — Z87310 Personal history of (healed) osteoporosis fracture: Secondary | ICD-10-CM

## 2017-03-23 LAB — BASIC METABOLIC PANEL
Anion gap: 3 — ABNORMAL LOW (ref 5–15)
BUN: 12 mg/dL (ref 6–20)
CO2: 25 mmol/L (ref 22–32)
Calcium: 8.2 mg/dL — ABNORMAL LOW (ref 8.9–10.3)
Chloride: 111 mmol/L (ref 101–111)
Creatinine, Ser: 1.02 mg/dL — ABNORMAL HIGH (ref 0.44–1.00)
GFR calc Af Amer: >60 mL/min (ref >60)
GFR calc non Af Amer: >60 mL/min (ref >60)
Glucose, Bld: 145 mg/dL — ABNORMAL HIGH (ref 65–99)
Potassium: 4.2 mmol/L (ref 3.5–5.1)
Sodium: 139 mmol/L (ref 135–145)

## 2017-03-23 LAB — HEPARIN LEVEL (UNFRACTIONATED)
Heparin Unfractionated: 0.32 IU/mL (ref 0.30–0.70)
Heparin Unfractionated: 0.48 IU/mL (ref 0.30–0.70)

## 2017-03-23 LAB — GLUCOSE, CAPILLARY
Glucose-Capillary: 140 mg/dL — ABNORMAL HIGH (ref 65–99)
Glucose-Capillary: 195 mg/dL — ABNORMAL HIGH (ref 65–99)
Glucose-Capillary: 142 mg/dL — ABNORMAL HIGH (ref 65–99)

## 2017-03-23 LAB — HEMOGLOBIN A1C
Hgb A1c MFr Bld: 9.1 % — ABNORMAL HIGH (ref 4.8–5.6)
Mean Plasma Glucose: 214 mg/dL

## 2017-03-23 LAB — CBC WITH DIFFERENTIAL/PLATELET
Basophils Absolute: 0.1 10*3/uL (ref 0.0–0.1)
Basophils Relative: 1 %
Eosinophils Absolute: 0.1 10*3/uL (ref 0.0–0.7)
Eosinophils Relative: 1 %
HCT: 36.3 % (ref 36.0–46.0)
Hemoglobin: 11.6 g/dL — ABNORMAL LOW (ref 12.0–15.0)
Lymphocytes Relative: 70 %
Lymphs Abs: 6.5 10*3/uL — ABNORMAL HIGH (ref 0.7–4.0)
MCH: 29.3 pg (ref 26.0–34.0)
MCHC: 32 g/dL (ref 30.0–36.0)
MCV: 91.7 fL (ref 78.0–100.0)
Monocytes Absolute: 0.8 10*3/uL (ref 0.1–1.0)
Monocytes Relative: 8 %
Neutro Abs: 1.9 10*3/uL (ref 1.7–7.7)
Neutrophils Relative %: 20 %
Platelets: 155 10*3/uL (ref 150–400)
RBC: 3.96 MIL/uL (ref 3.87–5.11)
RDW: 16.2 % — ABNORMAL HIGH (ref 11.5–15.5)
WBC: 9.4 10*3/uL (ref 4.0–10.5)

## 2017-03-23 LAB — MAGNESIUM: Magnesium: 1.9 mg/dL (ref 1.7–2.4)

## 2017-03-23 MED ORDER — SENNOSIDES-DOCUSATE SODIUM 8.6-50 MG PO TABS: 1.0000 | ORAL_TABLET | Freq: Every day | ORAL | Status: DC

## 2017-03-23 MED ORDER — OXYCODONE HCL 5 MG PO TABS
10.0000 mg | ORAL_TABLET | Freq: Three times a day (TID) | ORAL | Status: DC | PRN
  Administered 2017-03-23 – 2017-03-24 (×3): 10 mg via ORAL
  Filled 2017-03-23 (×3): qty 2

## 2017-03-23 NOTE — Evaluation (Signed)
Physical Therapy Evaluation Patient Details Name: AVEN CEGIELSKI MRN: [id number] DOB: 1966/06/09 Today's Date: 03/23/2017   History of Present Illness  admitted 03/21/17 with SOB, Positive for bilateral PE and right DVT. Medical history significant of HTN, HLD, insulin dependent DM, asthma, morbid obesity  Clinical Impression  The patient is independent ambulating. Oxygen saturation 97%, HR 115. No further PT needs.   Follow Up Recommendations No PT follow up    Equipment Recommendations  None recommended by PT    Recommendations for Other Services       Precautions / Restrictions Precautions Precautions: Fall Restrictions Weight Bearing Restrictions: No      Mobility  Bed Mobility Overal bed mobility: Independent                Transfers Overall transfer level: Independent                  Ambulation/Gait Ambulation/Gait assistance: Independent Ambulation Distance (Feet): 200 Feet Assistive device:  (held IV pole, walked in room without it,.)          Stairs            Wheelchair Mobility    Modified Rankin (Stroke Patients Only)       Balance Overall balance assessment: Independent                                           Pertinent Vitals/Pain Pain Assessment: 0-10 Pain Score: 3  Pain Location: under right scapula Pain Descriptors / Indicators: Aching Pain Intervention(s): Premedicated before session    Home Living Family/patient expects to be discharged to:: Private residence Living Arrangements: Other relatives Available Help at Discharge: Family Type of Home: House Home Access: Stairs to enter Entrance Stairs-Rails: Psychiatric nurse of Steps: 2 Home Layout: One level Home Equipment: Environmental consultant - 4 wheels;Cane - single point;Shower seat      Prior Function Level of Independence: Independent               Hand Dominance        Extremity/Trunk Assessment   Upper Extremity  Assessment Upper Extremity Assessment: Overall WFL for tasks assessed    Lower Extremity Assessment Lower Extremity Assessment: Overall WFL for tasks assessed    Cervical / Trunk Assessment Cervical / Trunk Assessment: Normal  Communication   Communication: No difficulties  Cognition Arousal/Alertness: Awake/alert Behavior During Therapy: WFL for tasks assessed/performed Overall Cognitive Status: Within Functional Limits for tasks assessed                                        General Comments      Exercises     Assessment/Plan    PT Assessment Patent does not need any further PT services  PT Problem List         PT Treatment Interventions      PT Goals (Current goals can be found in the Care Plan section)  Acute Rehab PT Goals Patient Stated Goal: go home PT Goal Formulation: All assessment and education complete, DC therapy    Frequency     Barriers to discharge        Co-evaluation               AM-PAC PT "6 Clicks" Daily Activity  Outcome Measure  Difficulty turning over in bed (including adjusting bedclothes, sheets and blankets)?: None Difficulty moving from lying on back to sitting on the side of the bed? : None Difficulty sitting down on and standing up from a chair with arms (e.g., wheelchair, bedside commode, etc,.)?: None Help needed moving to and from a bed to chair (including a wheelchair)?: None Help needed walking in hospital room?: None Help needed climbing 3-5 steps with a railing? : None 6 Click Score: 24    End of Session   Activity Tolerance: Patient tolerated treatment well Patient left: in bed;with call bell/phone within reach Nurse Communication: Mobility status      Time: 1423-9532 PT Time Calculation (min) (ACUTE ONLY): 16 min   Charges:   PT Evaluation $PT Eval Low Complexity: 1 Procedure     PT G CodesTresa Endo PT [phone]   Claretha Cooper 03/23/2017, 11:07 AM

## 2017-03-23 NOTE — Progress Notes (Addendum)
ANTICOAGULATION CONSULT NOTE - f/u Consult  Pharmacy Consult for IV Heparin Indication: pulmonary embolus  Allergies  Allergen Reactions  . Cafergot Other (See Comments)    Chest pain; ergotamine-caffiene  . Glucophage [Metformin Hcl] Anaphylaxis  . Canagliflozin Swelling and Other (See Comments)    Legs Swell invokana Legs Swell    Patient Measurements: Height: 5\' 8"  (172.7 cm) Weight: (!) 390 lb 3.4 oz (177 kg) IBW/kg (Calculated) : 63.9 Heparin Dosing Weight: 111.2 kg  Vital Signs: Temp: 98.1 F (36.7 C) (06/03 0601) Temp Source: Oral (06/03 0601) BP: 125/68 (06/03 0601) Pulse Rate: 74 (06/03 0601)  Labs:  Recent Labs  03/21/17 1348 03/21/17 2340 03/22/17 0740 03/23/17 0518  HGB 11.9*  --  11.5* 11.6*  HCT 36.5  --  35.0* 36.3  PLT 156  --  152 155  LABPROT  --   --  14.5  --   INR  --   --  1.12  --   HEPARINUNFRC  --  0.34 0.41 0.32  CREATININE 1.26*  --  1.05* 1.02*    Estimated Creatinine Clearance: 112.4 mL/min (A) (by C-G formula based on SCr of 1.02 mg/dL (H)).   Medical History: Past Medical History:  Diagnosis Date  . Anxiety   . Asthma   . 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 Depression   . Diabetes mellitus   . Hypertension   . Migraines   . Near syncope   . Obesity   . PCOS (polycystic ovarian syndrome)   . Peripheral edema   . Sleep apnea    uses cpap set on 10  . 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 aspirin  81 mg Oral Daily  . atorvastatin  40 mg Oral Daily  . cholecalciferol  2,000 Units Oral Daily  . insulin aspart  0-20 Units Subcutaneous TID WC  . insulin aspart  15 Units Subcutaneous TID WC  . insulin glargine  80 Units Subcutaneous QHS  . mouth rinse  15 mL Mouth Rinse BID  . mometasone-formoterol  2 puff Inhalation BID  . montelukast  10 mg Oral QHS  . oxyCODONE  5 mg Oral Once  . polyethylene glycol  17 g Oral Daily  . senna-docusate  1 tablet Oral BID  .  sertraline  200 mg Oral Daily  . sodium chloride flush  3 mL Intravenous Q12H    Assessment: Pharmacy is consulted to dose heparin drip in 51 yo female diagnosed with PE. Pt was directed to ED by PCP. Pt with PMH of morbid obesity, hypertension, diabetes, asthma, and chronic back pain. PCCM states no right heart strain. CTA shows segmental and subsegmental PR with no central clots. Doppler on 6/2 showed R popliteal DVT.  Baseline Labs: PT/INR wnl, Scr elevated at 1.26, Hgb 11.9, Hct 36.5, plt 156  6/3:   Heparin level remains in the therapeutic range but trended down to the lower end on 1900units/hr.  No infusion or bleeding issues per RN.  SCr improved.  On aspirin 81mg  daily continued from home.   Goal of Therapy:  Heparin level 0.3-0.7 units/ml Monitor platelets by anticoagulation protocol: Yes   Plan:   Increase heparin drip to 2100units/hr to keep in therapeutic range  Repeat heparin level in 6 hrs  Daily heparin level   Daily CBC while on heparin  Monitor for signs and symptoms of bleeding  Plans for Xarelto noted.  Romeo Rabon, PharmD, pager [phone]. 03/23/2017,7:13 AM.  Addendum: Heparin level 0.48 on 2100units/hr.  Cont same and f/u AM labs.  Romeo Rabon, PharmD, pager [phone]. 03/23/2017,2:27 PM.

## 2017-03-23 NOTE — Progress Notes (Signed)
PROGRESS NOTE  Kristen [name]:[id number] DOB: [birth year] DOA: 03/21/2017 PCP: Lawerance Cruel, MD  HPI/Recap of past 24 hours:  Feeling better, less pain, on room air. No cough  Assessment/Plan: Active Problems:   Pulmonary embolism (HCC)   Pulmonary emboli (HCC)   Submassive PE with right heart strain: --CTA: Acute bilateral lower lobe segmental and subsegmental pulmonary emboli ---patient denies family history of clotting disorders, she does not smoke, not on hormonal therapy, no recent long travel history, she report she is uptodate with mamogram/colonoscopy and pap smear. She does has morbid obesity, baseline sedentary.  --she is started on heparin drip and admitted to stepdown, improving, hemodynamically stable, transfer to med tele on 6/2 -- Bilateral lower extremity venous doppler showed right lower extremity DVT,  Echocardiogram lvef 60%, grade II diastolic dysfunction, right ventricle cavity size mildly dilated. -choice of long term anticoagulation, transition to elliquis on 6/4 per hematology recommendation.  ARF on CKDIII,  Cr 1.26 on admission, cr 1.02 today  UA unremarkable. Hold demadex, hold acei, , renal dosing  meds  Insulin dependent DM2, hold victoza, a1c 9.1, continue insulin, adjust as needed  HTN;  bp low normal on presentation, lisinopril and demadex held since admission, likely able to resume on 6/4  Asthma: stable, no wheezing, continue home meds  Morbid obesity: life style modification  Splenomegaly/lymphocytosis: Splenomegaly is an incidental finding on CTA chest, patient denies symptom,  CT  ab/pel liver unremarkable,  lymphocytosis?, LDH likely will be high in the setting of acute PE., HIV negative,  hematology consulted, DrGorsuch input appreciated, flow cytometry ordered.   Chronic back pain: on prn oxycodone at home  DVT prophylaxis: on heparin drip  Consultants:  EDP discussed case with critical care Hematology   Code  Status: full   Family Communication:  Patient   Disposition Plan: home, likely on 6/4   Procedures:  none  Antibiotics:  none   Objective: BP 125/68 (BP Location: Right Arm)   Pulse 74   Temp 98.1 F (36.7 C) (Oral)   Resp 20   Ht 5\' 8"  (1.727 m)   Wt (!) 177 kg (390 lb 3.4 oz)   LMP 03/07/2017   SpO2 99%   BMI 59.33 kg/m   Intake/Output Summary (Last 24 hours) at 03/23/17 0820 Last data filed at 03/23/17 0606  Gross per 24 hour  Intake              658 ml  Output                0 ml  Net              658 ml   Filed Weights   03/21/17 1204 03/22/17 0354  Weight: (!) 177.4 kg (391 lb) (!) 177 kg (390 lb 3.4 oz)    Exam:   General:  Obese, NAD  Cardiovascular: RRR  Respiratory: CTABL  Abdomen: Soft/ND/NT, positive BS  Musculoskeletal: No Edema  Neuro: aaox3  Data Reviewed: Basic Metabolic Panel:  Recent Labs Lab 03/21/17 1348 03/22/17 0740 03/23/17 0518  NA 135 137 139  K 4.6 4.0 4.2  CL 108 110 111  CO2 23 22 25   GLUCOSE 221* 158* 145*  BUN 23* 16 12  CREATININE 1.26* 1.05* 1.02*  CALCIUM 8.1* 8.3* 8.2*  MG  --   --  1.9   Liver Function Tests:  Recent Labs Lab 03/21/17 1348 03/22/17 0740  AST 33 29  ALT 33 32  ALKPHOS 71 61  BILITOT 0.3 0.6  PROT 6.0* 5.5*  ALBUMIN 2.8* 2.6*   No results for input(s): LIPASE, AMYLASE in the last 168 hours. No results for input(s): AMMONIA in the last 168 hours. CBC:  Recent Labs Lab 03/21/17 1348 03/22/17 0740 03/23/17 0518  WBC 14.0* 8.6 9.4  NEUTROABS 3.4  --  1.9  HGB 11.9* 11.5* 11.6*  HCT 36.5 35.0* 36.3  MCV 90.6 91.1 91.7  PLT 156 152 155   Cardiac Enzymes:   No results for input(s): CKTOTAL, CKMB, CKMBINDEX, TROPONINI in the last 168 hours. BNP (last 3 results)  Recent Labs  03/21/17 1348  BNP 27.2    ProBNP (last 3 results) No results for input(s): PROBNP in the last 8760 hours.  CBG:  Recent Labs Lab 03/22/17 1248 03/22/17 1654 03/22/17 1723  03/22/17 2117 03/23/17 0744  GLUCAP 170* 61* 103* 177* 140*    Recent Results (from the past 240 hour(s))  MRSA PCR Screening     Status: None   Collection Time: 03/22/17  4:10 AM  Result Value Ref Range Status   MRSA by PCR NEGATIVE NEGATIVE Final    Comment:        The GeneXpert MRSA Assay (FDA approved for NASAL specimens only), is one component of a comprehensive MRSA colonization surveillance program. It is not intended to diagnose MRSA infection nor to guide or monitor treatment for MRSA infections.      Studies: No results found.  Scheduled Meds: . aspirin  81 mg Oral Daily  . atorvastatin  40 mg Oral Daily  . cholecalciferol  2,000 Units Oral Daily  . insulin aspart  0-20 Units Subcutaneous TID WC  . insulin aspart  15 Units Subcutaneous TID WC  . insulin glargine  80 Units Subcutaneous QHS  . mouth rinse  15 mL Mouth Rinse BID  . mometasone-formoterol  2 puff Inhalation BID  . montelukast  10 mg Oral QHS  . oxyCODONE  5 mg Oral Once  . polyethylene glycol  17 g Oral Daily  . senna-docusate  1 tablet Oral BID  . sertraline  200 mg Oral Daily  . sodium chloride flush  3 mL Intravenous Q12H    Continuous Infusions: . heparin 2,100 Units/hr (03/23/17 0719)     Time spent: 78mins  Tae Robak MD, PhD  [facility] Pager [phone]. If 7PM-7AM, please contact night-coverage at [email or web address], password [facility] 03/23/2017, 8:20 AM  LOS: 2 days

## 2017-03-23 NOTE — Consult Note (Signed)
[facility] CONSULT NOTE  Patient Care Team: Lawerance Cruel, MD as PCP - General (Family Medicine)  CHIEF COMPLAINTS/PURPOSE OF CONSULTATION:  Right lower extremity DVT and significant PE, along with abnormal lymphocytosis  HISTORY OF PRESENTING ILLNESS:  Kristen [name] 51 y.o. female is seen at the request by hospitalist due to DVT and PE and abnormal lymphocytosis This patient is morbidly obese with significant cardiovascular risk factors including poorly controlled diabetes, hypertension, prior history of stroke and others. According to the patient, she had accidental fall at home a month ago but did not sustain major injury The patient also had poorly controlled asthma and was started on steroid injection along with oral corticosteroids. That in turn cause significant urinary frequency presumably due to severe uncontrolled hyperglycemia at home. She was also started on diuretic for fluid retention and admits she is drinking less liquid and possibly dehydrated prior to admission. Her mobility is poor due to recent fall and asthma. Subsequently, she started to feel progressive shortness of breath and chest pressure. She was directed to the emergency department and underwent ultrasound venous Doppler along with CT imaging which show significant changes suggestive of massive PE. She is currently on IV heparin.  She denies lower extremity warmth, tenderness & erythema. She has chronic leg swelling and recurrent cellulitis with abnormal skin changes on the legs Since admission, her chest pressure and shortness of breath have improved a little bit.  She denies recent pre-syncopal episodes, hemoptysis, or palpitation. She denies recent long distance travel, recent surgery or smoking She had no prior history or diagnosis of cancer. Her age appropriate screening programs are up-to-date. She had prior surgeries before and never had perioperative thromboembolic events. The patient had  never been pregnant before There is no family history of blood clots or miscarriages.  MEDICAL HISTORY:  Past Medical History:  Diagnosis Date  . Anxiety   . Asthma   . 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 Depression   . Diabetes mellitus   . Hypertension   . Migraines   . Near syncope   . Obesity   . PCOS (polycystic ovarian syndrome)   . Peripheral edema   . Sleep apnea    uses cpap set on 10  . Stroke [facility]) 12/2011   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     SURGICAL HISTORY: Past Surgical History:  Procedure Laterality Date  . ABDOMINAL SURGERY     hernia repair  . CHOLECYSTECTOMY  [id number]  . COLONOSCOPY WITH PROPOFOL N/A 07/31/2016   Procedure: COLONOSCOPY WITH PROPOFOL;  Surgeon: Arta Silence, MD;  Location: [facility] ENDOSCOPY;  Service: Endoscopy;  Laterality: N/A;  . FOOT SURGERY Right 06/2007  . KNEE ARTHROSCOPY  09/2008   left  . KNEE ARTHROSCOPY  april 2011   left  . NASAL SINUS SURGERY    . NASAL SINUS SURGERY  1990  . SHOULDER SURGERY  2004   left    SOCIAL HISTORY: Social History   Social History  . Marital status: Single    Spouse name: N/A  . Number of children: N/A  . Years of education: N/A   Occupational History  . Not on file.   Social History Main Topics  . Smoking status: Never Smoker  . Smokeless tobacco: Never Used  . Alcohol use No  . Drug use: No  . Sexual activity: No   Other Topics Concern  . Not on file   Social History Narrative  .  No narrative on file    FAMILY HISTORY: Family History  Problem Relation Age of Onset  . Hypertension Maternal Grandmother   . Stroke Maternal Grandmother   . Diabetes Maternal Grandfather   . Cancer Mother        lung  . Cancer Father   . Diabetes Sister   . Heart attack Neg Hx     ALLERGIES:  is allergic to cafergot; glucophage [metformin hcl]; and canagliflozin.  MEDICATIONS:  Current Facility-Administered Medications  Medication Dose Route Frequency Provider Last Rate Last Dose  .  albuterol (PROVENTIL) (2.5 MG/3ML) 0.083% nebulizer solution 2.5 mg  2.5 mg Nebulization Q4H PRN Florencia Reasons, MD      . aspirin chewable tablet 81 mg  81 mg Oral Daily Florencia Reasons, MD   81 mg at 03/23/17 1011  . atorvastatin (LIPITOR) tablet 40 mg  40 mg Oral Daily Florencia Reasons, MD   40 mg at 03/22/17 1755  . cholecalciferol (VITAMIN D) tablet 2,000 Units  2,000 Units Oral Daily Florencia Reasons, MD   2,000 Units at 03/23/17 1011  . heparin ADULT infusion 100 units/mL (25000 units/256mL sodium chloride 0.45%)  2,100 Units/hr Intravenous Continuous Florencia Reasons, MD 21 mL/hr at 03/23/17 0719 2,100 Units/hr at 03/23/17 0719  . insulin aspart (novoLOG) injection 0-20 Units  0-20 Units Subcutaneous TID WC Florencia Reasons, MD   3 Units at 03/23/17 [phone]  . insulin aspart (novoLOG) injection 15 Units  15 Units Subcutaneous TID WC Florencia Reasons, MD   15 Units at 03/23/17 [phone]  . insulin glargine (LANTUS) injection 80 Units  80 Units Subcutaneous QHS Florencia Reasons, MD   80 Units at 03/22/17 2252  . MEDLINE mouth rinse  15 mL Mouth Rinse BID Florencia Reasons, MD   15 mL at 03/23/17 1000  . mometasone-formoterol (DULERA) 200-5 MCG/ACT inhaler 2 puff  2 puff Inhalation BID Florencia Reasons, MD   2 puff at 03/22/17 0859  . montelukast (SINGULAIR) tablet 10 mg  10 mg Oral QHS Florencia Reasons, MD   10 mg at 03/22/17 2252  . oxyCODONE (Oxy IR/ROXICODONE) immediate release tablet 10 mg  10 mg Oral Q8H PRN Florencia Reasons, MD   10 mg at 03/23/17 1012  . oxyCODONE (Oxy IR/ROXICODONE) immediate release tablet 5 mg  5 mg Oral Once Jani Gravel, MD   Stopped at 03/22/17 0430  . polyethylene glycol (MIRALAX / GLYCOLAX) packet 17 g  17 g Oral Daily Florencia Reasons, MD      . senna-docusate (Senokot-S) tablet 1 tablet  1 tablet Oral BID Florencia Reasons, MD   1 tablet at 03/23/17 1011  . sertraline (ZOLOFT) tablet 200 mg  200 mg Oral Daily Florencia Reasons, MD   200 mg at 03/23/17 1012  . sodium chloride flush (NS) 0.9 % injection 3 mL  3 mL Intravenous Q12H Florencia Reasons, MD   3 mL at 03/23/17 1013    REVIEW OF  SYSTEMS:   Constitutional: Denies fevers, chills or abnormal night sweats Eyes: Denies blurriness of vision, double vision or watery eyes Ears, nose, mouth, throat, and face: Denies mucositis or sore throat Gastrointestinal:  Denies nausea, heartburn or change in bowel habits Lymphatics: Denies new lymphadenopathy or easy bruising Neurological:Denies numbness, tingling or new weaknesses Behavioral/Psych: Mood is stable, no new changes  All other systems were reviewed with the patient and are negative.  PHYSICAL EXAMINATION: ECOG PERFORMANCE STATUS: 2 - Symptomatic, <50% confined to bed  Vitals:   03/22/17 2159 03/23/17 0601  BP: Marland Kitchen)  146/76 125/68  Pulse: 76 74  Resp: 20 20  Temp: 98.7 F (37.1 C) 98.1 F (36.7 C)   Filed Weights   03/21/17 1204 03/22/17 0354  Weight: (!) 391 lb (177.4 kg) (!) 390 lb 3.4 oz (177 kg)    GENERAL:alert, no distress and comfortable. She is morbidly obese SKIN: Noted mild chronic venous stasis changes in her legs EYES: normal, conjunctiva are pink and non-injected, sclera clear OROPHARYNX:no exudate, no erythema and lips, buccal mucosa, and tongue normal  NECK: supple, thyroid normal size, non-tender, without nodularity LYMPH:  no palpable lymphadenopathy in the cervical, axillary or inguinal LUNGS: clear to auscultation and percussion with normal breathing effort HEART: regular rate & rhythm and no murmurs and no lower extremity edema ABDOMEN:abdomen soft, non-tender and normal bowel sounds Musculoskeletal:no cyanosis of digits and no clubbing  PSYCH: alert & oriented x 3 with fluent speech NEURO: no focal motor/sensory deficits  LABORATORY DATA:  I have reviewed the data as listed Lab Results  Component Value Date   WBC 9.4 03/23/2017   HGB 11.6 (L) 03/23/2017   HCT 36.3 03/23/2017   PLT 155 03/23/2017   GLUCOSE 145 (H) 03/23/2017   ALT 32 03/22/2017   AST 29 03/22/2017   NA 139 03/23/2017   K 4.2 03/23/2017   CL 111 03/23/2017    CREATININE 1.02 (H) 03/23/2017   BUN 12 03/23/2017   CO2 25 03/23/2017   TSH 2.997 03/22/2017   INR 1.12 03/22/2017   HGBA1C 9.1 (H) 03/21/2017     RADIOGRAPHIC STUDIES: I have personally reviewed the radiological images as listed and agreed with the findings in the report. Ct Abdomen Pelvis Wo Contrast  Result Date: 03/21/2017 CLINICAL DATA:  Splenomegaly EXAM: CT ABDOMEN AND PELVIS WITHOUT CONTRAST TECHNIQUE: Multidetector CT imaging of the abdomen and pelvis was performed following the standard protocol without IV contrast. COMPARISON:  CT 03/21/2017 FINDINGS: Lower chest: Small foci of nodular atelectasis in the sub pleural left lower lobe anteriorly. No consolidation or pleural effusion. The heart is nonenlarged. Hepatobiliary: No focal liver abnormality is seen. Status post cholecystectomy. No biliary dilatation. Pancreas: Unremarkable. No pancreatic ductal dilatation or surrounding inflammatory changes. Spleen: Enlarged, measuring 19 cm. Adrenals/Urinary Tract: Adrenal glands are unremarkable. Kidneys are normal, without renal calculi, focal lesion, or hydronephrosis. Bladder is unremarkable. Excreted contrast in the collecting systems and bladder. Stomach/Bowel: Stomach is within normal limits. Appendix appears normal. No evidence of bowel wall thickening, distention, or inflammatory changes. Sigmoid colon diverticula without acute inflammation. Vascular/Lymphatic: Aortic atherosclerosis. No enlarged abdominal or pelvic lymph nodes. Reproductive: Uterus and bilateral adnexa are unremarkable. Other: Diastasis of the rectus with small ventral defect and fatty hernia. No free air or free fluid Musculoskeletal: Trace retrolisthesis of L5 on S1. Lucent lesion in L1, unchanged compared with MRI 12/09/2016, and corresponding to T1 bright lesion, possible hemangioma. Superior endplate irregularity at T11 with Schmorl's node. IMPRESSION: 1. Splenomegaly up to 19 cm.  No focal splenic abnormality. 2. No  acute intra-abdominal or pelvic pathology 3. Sigmoid colon diverticula without acute inflammation Electronically Signed   By: Donavan Foil M.D.   On: 03/21/2017 19:31   Dg Chest 2 View  Result Date: 03/21/2017 CLINICAL DATA:  Short of breath for 2 weeks EXAM: CHEST  2 VIEW COMPARISON:  12/22/2016 FINDINGS: Normal heart size. Lungs clear. No pneumothorax. No pleural effusion. IMPRESSION: No active cardiopulmonary disease. Electronically Signed   By: Marybelle Killings M.D.   On: 03/21/2017 13:12   Ct Angio Chest  Pe W Or Wo Contrast  Result Date: 03/21/2017 CLINICAL DATA:  Insertional back pain and dyspnea x2 weeks. EXAM: CT ANGIOGRAPHY CHEST WITH CONTRAST TECHNIQUE: Multidetector CT imaging of the chest was performed using the standard protocol during bolus administration of intravenous contrast. Multiplanar CT image reconstructions and MIPs were obtained to evaluate the vascular anatomy. CONTRAST:  58 cc Isovue-300 COMPARISON:  12/22/2016 chest CT FINDINGS: Cardiovascular: Acute bilateral lower lobe segmental and subsegmental pulmonary emboli. RV/ LV ratio equals 1.07 consistent with right heart strain. Heart is normal in size. No aortic aneurysm or dissection. No pericardial effusion. Mediastinum/Nodes: No enlarged mediastinal, hilar, or axillary lymph nodes. Thyroid gland, trachea, and esophagus demonstrate no significant findings. Lungs/Pleura: Ground-glass opacities within both lungs consistent with hypoventilatory change. Sequela of mild interstitial edema not entirely excluded. New pleural-based opacity in the periphery of the left lower lobe series 12, image 36 likely represents a new focus of atelectasis or postinflammatory change. Tiny pulmonary infarct is within differential possibilities given current findings believed less likely given somewhat more ovoid appearance along its caudal aspect. Stable 3 mm or less nodular opacities are noted in both lower lobes and right middle lobe unchanged in appearance.  Upper Abdomen: No space-occupying mass of the liver. The spleen appears mildly enlarged to the extent visualized, measuring up to 18.9 cm in AP dimension. Musculoskeletal: No chest wall abnormality. No acute or suspicious osseous abnormalities. Degenerative changes are seen along the dorsal spine. Review of the MIP images confirms the above findings. IMPRESSION: 1. Positive for acute PE with CT evidence of right heart strain (RV/LV Ratio = 1.07) consistent with at least submassive (intermediate risk) PE. The presence of right heart strain has been associated with an increased risk of morbidity and mortality. Please activate Code PE by paging [phone]. Critical Value/emergent results were called by telephone at the time of interpretation on 03/21/2017 at 3:23 pm to Dr. Veryl Speak , who verbally acknowledged these results. 2. Stable bilobed lateral lower lobe and right middle lobe 3 mm or less pulmonary nodules. New focus of opacity along the periphery of the left lower lobe most likely represents a focus of atelectasis. Tiny pulmonary infarct is not entirely excluded but believed less likely. 3. Splenomegaly Electronically Signed   By: Ashley Royalty M.D.   On: 03/21/2017 15:29    ASSESSMENT & PLAN  Provoked DVT/PE This episode of blood clot appeared to be provoked by recent fall, dehydration with poor mobility. There is no role to screen for thrombophilia disorder. We discussed about various options of anticoagulation therapies including warfarin, low molecular weight heparin such as Lovenox or newer agents such as Rivaroxaban. Some of the risks and benefits discussed including costs involved, the need for monitoring, risks of life-threatening bleeding/hospitalization, reversibility of each agent in the event of bleeding or overdose, safety profile of each drug and taking into account other social issues such as ease of administration of medications, etc. Ultimately, we have made an informed decision for the  patient to continue her treatment with NOAC, preferably Eliquis but Xarelto would be an acceptable alternative.  I will continue IV heparin for at least 24-48 hours before transitioning her to NOAC I recommend the patient to use elastic compression stockings at 20-30 mmHg to reduce risks of chronic thrombophlebitis. Duration of treatment would be for at least one year.  Lymphocytosis Cause is unknown. I will order flow cytometry tomorrow for further evaluation  Abnormal lung changes We'll follow with CT scan in a few months  Splenomegaly  Could be due to chronic fatty liver disease. Lymphoma cannot be excluded We'll follow with repeat imaging study  History of stroke, poorly controlled diabetes and hypertension I would recommend she continue 81 mg aspirin in addition to anticoagulation therapy as above due to significant cardiovascular risk factors  Discharge planning  She has life-threatening PE The patient is still symptomatic with significant shortness of breath  She cannot be discharged today  I recommend daily assessment of her shortness of breath; Do not discharge until she started to have some symptomatic improvement I will return once flow cytometry result is available If she is discharged before then I will schedule return appointment in a few weeks   All questions were answered. The patient knows to call the clinic with any problems, questions or concerns.    Heath Lark, MD 03/23/2017 11:43 AM

## 2017-03-24 ENCOUNTER — Encounter: Payer: Medicare HMO | Admitting: Physical Medicine & Rehabilitation

## 2017-03-24 LAB — BASIC METABOLIC PANEL
Anion gap: 6 (ref 5–15)
BUN: 10 mg/dL (ref 6–20)
CO2: 24 mmol/L (ref 22–32)
Calcium: 8.3 mg/dL — ABNORMAL LOW (ref 8.9–10.3)
Chloride: 108 mmol/L (ref 101–111)
Creatinine, Ser: 1.07 mg/dL — ABNORMAL HIGH (ref 0.44–1.00)
GFR calc Af Amer: >60 mL/min (ref >60)
GFR calc non Af Amer: 59 mL/min — ABNORMAL LOW (ref >60)
Glucose, Bld: 116 mg/dL — ABNORMAL HIGH (ref 65–99)
Potassium: 4.1 mmol/L (ref 3.5–5.1)
Sodium: 138 mmol/L (ref 135–145)

## 2017-03-24 LAB — HEPARIN LEVEL (UNFRACTIONATED): Heparin Unfractionated: 0.54 IU/mL (ref 0.30–0.70)

## 2017-03-24 LAB — GLUCOSE, CAPILLARY
Glucose-Capillary: 93 mg/dL (ref 65–99)
Glucose-Capillary: 122 mg/dL — ABNORMAL HIGH (ref 65–99)
Glucose-Capillary: 159 mg/dL — ABNORMAL HIGH (ref 65–99)

## 2017-03-24 LAB — MAGNESIUM: Magnesium: 1.8 mg/dL (ref 1.7–2.4)

## 2017-03-24 LAB — LACTATE DEHYDROGENASE: LDH: 309 U/L — ABNORMAL HIGH (ref 98–192)

## 2017-03-24 MED ORDER — INSULIN ASPART 100 UNIT/ML FLEXPEN: 15.0000 [IU] | PEN_INJECTOR | Freq: Three times a day (TID) | SUBCUTANEOUS | 11 refills | Status: DC

## 2017-03-24 MED ORDER — APIXABAN 5 MG PO TABS: 5.0000 mg | ORAL_TABLET | Freq: Two times a day (BID) | ORAL | Status: DC

## 2017-03-24 MED ORDER — SENNOSIDES-DOCUSATE SODIUM 8.6-50 MG PO TABS: 1.0000 | ORAL_TABLET | Freq: Every day | ORAL | 0 refills | Status: DC

## 2017-03-24 MED ORDER — LISINOPRIL 20 MG PO TABS: 20.0000 mg | ORAL_TABLET | Freq: Every day | ORAL | 0 refills | Status: DC

## 2017-03-24 MED ORDER — APIXABAN 5 MG PO TABS
10.0000 mg | ORAL_TABLET | Freq: Two times a day (BID) | ORAL | Status: DC
  Administered 2017-03-24: 10 mg via ORAL
  Filled 2017-03-24: qty 2

## 2017-03-24 MED ORDER — APIXABAN 5 MG PO TABS: ORAL_TABLET | ORAL | 0 refills | Status: DC

## 2017-03-24 NOTE — Discharge Instructions (Signed)
Information on my medicine - ELIQUIS (apixaban)  This medication education was reviewed with me or my healthcare representative as part of my discharge preparation.  The pharmacist that spoke with me during my hospital stay was:  Henreitta Leber. PharmD  Why was Eliquis prescribed for you? Eliquis was prescribed to treat blood clots that may have been found in the veins of your legs (deep vein thrombosis) or in your lungs (pulmonary embolism) and to reduce the risk of them occurring again.  What do You need to know about Eliquis ? The starting dose is 10 mg (two 5 mg tablets) taken TWICE daily for the FIRST SEVEN (7) DAYS, then on Monday, June 11th  the dose is reduced to ONE 5 mg tablet taken TWICE daily.  Eliquis may be taken with or without food.   Try to take the dose about the same time in the morning and in the evening. If you have difficulty swallowing the tablet whole please discuss with your pharmacist how to take the medication safely.  Take Eliquis exactly as prescribed and DO NOT stop taking Eliquis without talking to the doctor who prescribed the medication.  Stopping may increase your risk of developing a new blood clot.  Refill your prescription before you run out.  After discharge, you should have regular check-up appointments with your healthcare provider that is prescribing your Eliquis.    What do you do if you miss a dose? If a dose of ELIQUIS is not taken at the scheduled time, take it as soon as possible on the same day and twice-daily administration should be resumed. The dose should not be doubled to make up for a missed dose.  Important Safety Information A possible side effect of Eliquis is bleeding. You should call your healthcare provider right away if you experience any of the following: ? Bleeding from an injury or your nose that does not stop. ? Unusual colored urine (red or dark brown) or unusual colored stools (red or black). ? Unusual bruising for unknown  reasons. ? A serious fall or if you hit your head (even if there is no bleeding).  Some medicines may interact with Eliquis and might increase your risk of bleeding or clotting while on Eliquis. To help avoid this, consult your healthcare provider or pharmacist prior to using any new prescription or non-prescription medications, including herbals, vitamins, non-steroidal anti-inflammatory drugs (NSAIDs) and supplements.  This website has more information on Eliquis (apixaban): http://www.eliquis.com/eliquis/home

## 2017-03-24 NOTE — Progress Notes (Signed)
ANTICOAGULATION CONSULT NOTE - f/u Consult  Pharmacy Consult for IV Heparin to eliquis Indication: pulmonary embolus  Allergies  Allergen Reactions  . Cafergot Other (See Comments)    Chest pain; ergotamine-caffiene  . Glucophage [Metformin Hcl] Anaphylaxis  . Canagliflozin Swelling and Other (See Comments)    Legs Swell invokana Legs Swell    Patient Measurements: Height: 5\' 8"  (172.7 cm) Weight: (!) 390 lb 3.4 oz (177 kg) IBW/kg (Calculated) : 63.9 Heparin Dosing Weight: 111.2 kg  Vital Signs: Temp: 98 F (36.7 C) (06/04 0518) Temp Source: Oral (06/04 0518) BP: 135/81 (06/04 0518) Pulse Rate: 76 (06/04 0518)  Labs:  Recent Labs  03/21/17 1348  03/22/17 0740 03/23/17 0518 03/23/17 1342 03/24/17 0430  HGB 11.9*  --  11.5* 11.6*  --   --   HCT 36.5  --  35.0* 36.3  --   --   PLT 156  --  152 155  --   --   LABPROT  --   --  14.5  --   --   --   INR  --   --  1.12  --   --   --   HEPARINUNFRC  --   < > 0.41 0.32 0.48 0.54  CREATININE 1.26*  --  1.05* 1.02*  --  1.07*  < > = values in this interval not displayed.  Estimated Creatinine Clearance: 107.1 mL/min (A) (by C-G formula based on SCr of 1.07 mg/dL (H)).    Medications:  Scheduled:  . aspirin  81 mg Oral Daily  . atorvastatin  40 mg Oral Daily  . cholecalciferol  2,000 Units Oral Daily  . insulin aspart  0-20 Units Subcutaneous TID WC  . insulin aspart  15 Units Subcutaneous TID WC  . insulin glargine  80 Units Subcutaneous QHS  . mouth rinse  15 mL Mouth Rinse BID  . mometasone-formoterol  2 puff Inhalation BID  . montelukast  10 mg Oral QHS  . oxyCODONE  5 mg Oral Once  . senna-docusate  1 tablet Oral QHS  . sertraline  200 mg Oral Daily  . sodium chloride flush  3 mL Intravenous Q12H    Assessment: Pharmacy is consulted to dose heparin drip in 51 yo female diagnosed with PE. Pt was directed to ED by PCP. Pt with PMH of morbid obesity, hypertension, diabetes, asthma, and chronic back  pain. PCCM states no right heart strain. CTA shows segmental and subsegmental PR with no central clots. Doppler on 6/2 showed R popliteal DVT.  Baseline Labs: PT/INR wnl, Scr elevated at 1.26, Hgb 11.9, Hct 36.5, plt 156  Today, 03/24/2017:  Heparin level remains in the therapeutic with heparin at 2100 units/hr  CBC not ordered for this am  No infusion or bleeding issues per RN.  SCr slightly elevated (stable)  On aspirin 81mg  daily continued from home (history of stroke)  Goal of Therapy:  Heparin level 0.3-0.7 units/ml Monitor platelets by anticoagulation protocol: Yes   Plan:   Consult to transition heparin gtt to apixaban, apixaban 10mg  PO BID x 7 days then 5mg  PO BID  Monitor for signs and symptoms of bleeding  Pharmacist to provide education and coupon for free 30day supply  Doreene Eland, PharmD, BCPS.   Pager: [phone] 03/24/2017 7:21 AM

## 2017-03-24 NOTE — Progress Notes (Signed)
Pt given discharge teaching and instructions. Medication teaching reinforced regarding Eliquis and all other medications. Pt verbalized understanding of all discharge teaching. Pt discharged to home with packet vis wheelchair.

## 2017-03-24 NOTE — Discharge Summary (Signed)
Discharge Summary  Kristen [name] MVE:[id number] DOB: August 06, 1966  PCP: Lawerance Cruel, MD  Admit date: 03/21/2017 Discharge date: 03/24/2017  Time spent: <6mins  Recommendations for Outpatient Follow-up:  1. F/u with PMD within a week  for hospital discharge follow up, repeat cbc/bmp at follow up 2. F/u with hematology Dr Alvy Bimler  Discharge Diagnoses:  Active Hospital Problems   Diagnosis Date Noted  . Pulmonary embolism ([place]) 03/21/2017  . Pulmonary emboli ([place]) 03/21/2017    Resolved Hospital Problems   Diagnosis Date Noted Date Resolved  No resolved problems to display.    Discharge Condition: stable  Diet recommendation: heart healthy/carb modified  Filed Weights   03/21/17 1204 03/22/17 0354  Weight: (!) 177.4 kg (391 lb) (!) 177 kg (390 lb 3.4 oz)    History of present illness:  PCP: Lawerance Cruel, MD   Chief Complaint: sob  HPI: Kristen [name] is a 51 y.o. female  with medical history significant of HTN, HLD, insulin dependent DM, asthma, morbid obesity( Body mass index is 59.45 kg/m.) sent from primary care physician's office to rule out PE, patient report exertional dyspneas and pain between her shoulder blade. She has no wheezing , no lower extremity edema on exam, due to concern of PE she is sent to [facility] ED, CTA confirmed submassive PE (bilateral with right heart strain), she is otherwise hemodynamically stable, troponin negative, EDP discussed with critical care who advised no need of thrombolytics and patient to admit to hospitalist service.  Patient denies fever, no wheezing, no edema, no n/v.    Hospital Course:  Active Problems:   Pulmonary embolism (HCC)   Pulmonary emboli (HCC)   Submassive PE with right heart strain: --CTA: Acute bilateral lower lobe segmental and subsegmental pulmonary emboli ---Bilateral lower extremity venous doppler showed right lower extremity DVT,  Echocardiogram lvef 60%, grade II diastolic dysfunction,  right ventricle cavity size mildly dilated. -patient denies family history of clotting disorders, she does not smoke, not on hormonal therapy, no recent long travel history, she report she is uptodate with mamogram/colonoscopy and pap smear. She does has morbid obesity, baseline sedentary.  --she is started on heparin drip and admitted to stepdown, improving, hemodynamically stable, transfer to med tele on 6/2 -- symptom has improved, she denies sob, no pleuritic chest pain, bp and heart rate stable at discharge, she is discharged on elliquis.  -appreciated hematology Dr Alvy Bimler input, appreciate pharmacy for elliquis education.  ARF on CKDIII,  Cr 1.26 on admission, cr 1.07 at discharge.  UA unremarkable. demadex and  acei held in the hospital, resumed at discharge (lisinopril dose decreased),  renal dosing  meds  Insulin dependent DM2, a1c 9.1, victoza held in the hospital, resumed at discharge,   continue insulin, adjust as needed  HTN;  bp low normal on presentation, lisinopril and demadex held since admission,  Resumed demadex  20mg  daily At discharge, lisinopril resumed at lower dose ( she was on 40mg  daily, she is discharged on 20mg  daily) pmd to continue monitor blood pressure control.  History of  vision loss left eye in 03/2012 which is attributed to TIA, mri on the brain no acute findings , did show chronic microvascular ischemia, she is continued on home asa 81mg , statin, continue blood sugar control. 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  Asthma:stable, no wheezing, continue home meds   Splenomegaly/lymphocytosis: Splenomegaly is an incidental finding on CTA chest, patient denies symptom,  CT  ab/pel liver unremarkable,  lymphocytosis?,  LDH likely will be high in the setting of acute PE., HIV negative,  hematology consulted, DrGorsuch input appreciated, flow cytometry in process.   Chronic back pain: on prn oxycodone at home  Morbid obesity: life style  modification  Consultants: EDP discussed case with critical care Hematology   Code Status:full   Family Communication:Patient   Disposition Plan:home on 6/4   Procedures:  none  Antibiotics:  none    Discharge Exam: BP 135/81 (BP Location: Right Wrist)   Pulse 76   Temp 98 F (36.7 C) (Oral)   Resp 18   Ht 5\' 8"  (1.727 m)   Wt (!) 177 kg (390 lb 3.4 oz)   LMP 03/07/2017   SpO2 96%   BMI 59.33 kg/m     General:  Obese, NAD  Cardiovascular: RRR  Respiratory: CTABL  Abdomen: Soft/ND/NT, positive BS  Musculoskeletal: No Edema  Neuro: aaox3   Discharge Instructions You were cared for by a hospitalist during your hospital stay. If you have any questions about your discharge medications or the care you received while you were in the hospital after you are discharged, you can call the unit and asked to speak with the hospitalist on call if the hospitalist that took care of you is not available. Once you are discharged, your primary care physician will handle any further medical issues. Please note that NO REFILLS for any discharge medications will be authorized once you are discharged, as it is imperative that you return to your primary care physician (or establish a relationship with a primary care physician if you do not have one) for your aftercare needs so that they can reassess your need for medications and monitor your lab values.  Discharge Instructions    Diet - low sodium heart healthy    Complete by:  As directed    Carb modified   Increase activity slowly    Complete by:  As directed      Allergies as of 03/24/2017      Reactions   Cafergot Other (See Comments)   Chest pain; ergotamine-caffiene   Glucophage [metformin Hcl] Anaphylaxis   Canagliflozin Swelling, Other (See Comments)   Legs Swell invokana Legs Swell      Medication List    TAKE these medications   ACCU-CHEK AVIVA PLUS test strip Generic drug:  glucose blood USE  AS DIRECTED TWICE A DAY IN VITRO   albuterol 1.25 MG/3ML nebulizer solution Commonly known as:  ACCUNEB Inhale 1 ampule into the lungs 3 (three) times daily as needed for wheezing or shortness of breath.   VENTOLIN HFA 108 (90 Base) MCG/ACT inhaler Generic drug:  albuterol Inhale 2 puffs into the lungs every 4 (four) hours as needed for wheezing or shortness of breath.   apixaban 5 MG Tabs tablet Commonly known as:  ELIQUIS Take two tabs twice a day for 7 days, then take one tab twice a day for a year.   aspirin 81 MG tablet Take 81 mg by mouth daily.   atorvastatin 40 MG tablet Commonly known as:  LIPITOR Take 40 mg by mouth daily.   BD PEN NEEDLE NANO U/F 32G X 4 MM Misc Generic drug:  Insulin Pen Needle 2 (two) times daily. as directed   carisoprodol 350 MG tablet Commonly known as:  SOMA Take 1 tablet (350 mg total) by mouth every 8 (eight) hours as needed for muscle spasms.   Fluticasone-Salmeterol 500-50 MCG/DOSE Aepb Commonly known as:  ADVAIR Inhale 1 puff into  the lungs every 12 (twelve) hours.   ibuprofen 800 MG tablet Commonly known as:  ADVIL,MOTRIN Take 1 tablet by mouth 3 (three) times daily as needed.   insulin aspart 100 UNIT/ML FlexPen Commonly known as:  NOVOLOG FLEXPEN Inject 15 Units into the skin 3 (three) times daily with meals. What changed:  how much to take   insulin glargine 100 UNIT/ML injection Commonly known as:  LANTUS Inject 80-100 Units into the skin at bedtime.   lisinopril 20 MG tablet Commonly known as:  PRINIVIL,ZESTRIL Take 1 tablet (20 mg total) by mouth daily. What changed:  medication strength  how much to take  when to take this   montelukast 10 MG tablet Commonly known as:  SINGULAIR Take 10 mg by mouth at bedtime.   oxybutynin 5 MG tablet Commonly known as:  DITROPAN TAKE 1 TABLET BY MOUTH TWICE A DAY AS NEEDED FOR BLADDER   Oxycodone HCl 10 MG Tabs Take 1 tablet (10 mg total) by mouth every 8 (eight) hours  as needed. Take one tablet twice a day as needed   promethazine 25 MG tablet Commonly known as:  PHENERGAN Take 25 mg by mouth every 6 (six) hours as needed.   senna-docusate 8.6-50 MG tablet Commonly known as:  Senokot-S Take 1 tablet by mouth at bedtime.   sertraline 100 MG tablet Commonly known as:  ZOLOFT Take 200 mg by mouth daily.   torsemide 20 MG tablet Commonly known as:  DEMADEX Take 20 mg by mouth daily.   VICTOZA 18 MG/3ML Sopn Generic drug:  liraglutide INJECT 1.8ML SUBCUTANEOUSLY ONCE DAILY FOR 30 DAYS   Vitamin D3 2000 units capsule Take 2,000 Units by mouth daily.      Allergies  Allergen Reactions  . Cafergot Other (See Comments)    Chest pain; ergotamine-caffiene  . Glucophage [Metformin Hcl] Anaphylaxis  . Canagliflozin Swelling and Other (See Comments)    Legs Swell invokana Legs Swell   Follow-up Information    Lawerance Cruel, MD Follow up on 04/03/2017.   Specialty:  Family Medicine Contact information: [place] [address] [phone]        Heath Lark, MD Follow up.   Specialty:  Hematology and Oncology Contact information: [place] [address] [phone]            The results of significant diagnostics from this hospitalization (including imaging, microbiology, ancillary and laboratory) are listed below for reference.    Significant Diagnostic Studies: Ct Abdomen Pelvis Wo Contrast  Result Date: 03/21/2017 CLINICAL DATA:  Splenomegaly EXAM: CT ABDOMEN AND PELVIS WITHOUT CONTRAST TECHNIQUE: Multidetector CT imaging of the abdomen and pelvis was performed following the standard protocol without IV contrast. COMPARISON:  CT 03/21/2017 FINDINGS: Lower chest: Small foci of nodular atelectasis in the sub pleural left lower lobe anteriorly. No consolidation or pleural effusion. The heart is nonenlarged. Hepatobiliary: No focal liver abnormality is seen. Status post  cholecystectomy. No biliary dilatation. Pancreas: Unremarkable. No pancreatic ductal dilatation or surrounding inflammatory changes. Spleen: Enlarged, measuring 19 cm. Adrenals/Urinary Tract: Adrenal glands are unremarkable. Kidneys are normal, without renal calculi, focal lesion, or hydronephrosis. Bladder is unremarkable. Excreted contrast in the collecting systems and bladder. Stomach/Bowel: Stomach is within normal limits. Appendix appears normal. No evidence of bowel wall thickening, distention, or inflammatory changes. Sigmoid colon diverticula without acute inflammation. Vascular/Lymphatic: Aortic atherosclerosis. No enlarged abdominal or pelvic lymph nodes. Reproductive: Uterus and bilateral adnexa are unremarkable. Other: Diastasis of the rectus  with small ventral defect and fatty hernia. No free air or free fluid Musculoskeletal: Trace retrolisthesis of L5 on S1. Lucent lesion in L1, unchanged compared with MRI 12/09/2016, and corresponding to T1 bright lesion, possible hemangioma. Superior endplate irregularity at T11 with Schmorl's node. IMPRESSION: 1. Splenomegaly up to 19 cm.  No focal splenic abnormality. 2. No acute intra-abdominal or pelvic pathology 3. Sigmoid colon diverticula without acute inflammation Electronically Signed   By: Donavan Foil M.D.   On: 03/21/2017 19:31   Dg Chest 2 View  Result Date: 03/21/2017 CLINICAL DATA:  Short of breath for 2 weeks EXAM: CHEST  2 VIEW COMPARISON:  12/22/2016 FINDINGS: Normal heart size. Lungs clear. No pneumothorax. No pleural effusion. IMPRESSION: No active cardiopulmonary disease. Electronically Signed   By: Marybelle Killings M.D.   On: 03/21/2017 13:12   Ct Angio Chest Pe W Or Wo Contrast  Result Date: 03/21/2017 CLINICAL DATA:  Insertional back pain and dyspnea x2 weeks. EXAM: CT ANGIOGRAPHY CHEST WITH CONTRAST TECHNIQUE: Multidetector CT imaging of the chest was performed using the standard protocol during bolus administration of intravenous  contrast. Multiplanar CT image reconstructions and MIPs were obtained to evaluate the vascular anatomy. CONTRAST:  58 cc Isovue-300 COMPARISON:  12/22/2016 chest CT FINDINGS: Cardiovascular: Acute bilateral lower lobe segmental and subsegmental pulmonary emboli. RV/ LV ratio equals 1.07 consistent with right heart strain. Heart is normal in size. No aortic aneurysm or dissection. No pericardial effusion. Mediastinum/Nodes: No enlarged mediastinal, hilar, or axillary lymph nodes. Thyroid gland, trachea, and esophagus demonstrate no significant findings. Lungs/Pleura: Ground-glass opacities within both lungs consistent with hypoventilatory change. Sequela of mild interstitial edema not entirely excluded. New pleural-based opacity in the periphery of the left lower lobe series 12, image 36 likely represents a new focus of atelectasis or postinflammatory change. Tiny pulmonary infarct is within differential possibilities given current findings believed less likely given somewhat more ovoid appearance along its caudal aspect. Stable 3 mm or less nodular opacities are noted in both lower lobes and right middle lobe unchanged in appearance. Upper Abdomen: No space-occupying mass of the liver. The spleen appears mildly enlarged to the extent visualized, measuring up to 18.9 cm in AP dimension. Musculoskeletal: No chest wall abnormality. No acute or suspicious osseous abnormalities. Degenerative changes are seen along the dorsal spine. Review of the MIP images confirms the above findings. IMPRESSION: 1. Positive for acute PE with CT evidence of right heart strain (RV/LV Ratio = 1.07) consistent with at least submassive (intermediate risk) PE. The presence of right heart strain has been associated with an increased risk of morbidity and mortality. Please activate Code PE by paging [phone]. Critical Value/emergent results were called by telephone at the time of interpretation on 03/21/2017 at 3:23 pm to Dr. Veryl Speak ,  who verbally acknowledged these results. 2. Stable bilobed lateral lower lobe and right middle lobe 3 mm or less pulmonary nodules. New focus of opacity along the periphery of the left lower lobe most likely represents a focus of atelectasis. Tiny pulmonary infarct is not entirely excluded but believed less likely. 3. Splenomegaly Electronically Signed   By: Ashley Royalty M.D.   On: 03/21/2017 15:29    Microbiology: Recent Results (from the past 240 hour(s))  MRSA PCR Screening     Status: None   Collection Time: 03/22/17  4:10 AM  Result Value Ref Range Status   MRSA by PCR NEGATIVE NEGATIVE Final    Comment:        The GeneXpert  MRSA Assay (FDA approved for NASAL specimens only), is one component of a comprehensive MRSA colonization surveillance program. It is not intended to diagnose MRSA infection nor to guide or monitor treatment for MRSA infections.      Labs: Basic Metabolic Panel:  Recent Labs Lab 03/21/17 1348 03/22/17 0740 03/23/17 0518 03/24/17 0430  NA 135 137 139 138  K 4.6 4.0 4.2 4.1  CL 108 110 111 108  CO2 23 22 25 24   GLUCOSE 221* 158* 145* 116*  BUN 23* 16 12 10   CREATININE 1.26* 1.05* 1.02* 1.07*  CALCIUM 8.1* 8.3* 8.2* 8.3*  MG  --   --  1.9 1.8   Liver Function Tests:  Recent Labs Lab 03/21/17 1348 03/22/17 0740  AST 33 29  ALT 33 32  ALKPHOS 71 61  BILITOT 0.3 0.6  PROT 6.0* 5.5*  ALBUMIN 2.8* 2.6*   No results for input(s): LIPASE, AMYLASE in the last 168 hours. No results for input(s): AMMONIA in the last 168 hours. CBC:  Recent Labs Lab 03/21/17 1348 03/22/17 0740 03/23/17 0518  WBC 14.0* 8.6 9.4  NEUTROABS 3.4  --  1.9  HGB 11.9* 11.5* 11.6*  HCT 36.5 35.0* 36.3  MCV 90.6 91.1 91.7  PLT 156 152 155   Cardiac Enzymes: No results for input(s): CKTOTAL, CKMB, CKMBINDEX, TROPONINI in the last 168 hours. BNP: BNP (last 3 results)  Recent Labs  03/21/17 1348  BNP 27.2    ProBNP (last 3 results) No results for  input(s): PROBNP in the last 8760 hours.  CBG:  Recent Labs Lab 03/23/17 0744 03/23/17 1133 03/23/17 1623 03/23/17 2141 03/24/17 0822  GLUCAP 140* 195* 142* 93 122*       Signed:  Jadien Lehigh MD, PhD  [facility] 03/24/2017, 11:34 AM

## 2017-03-24 NOTE — Care Management Note (Signed)
Case Management Note  Patient Details  Name: Kristen [name] MRN: [id number] Date of Birth: 03-Dec-1965  Subjective/Objective:  Per secy benefit check-patient has no co pay for Eliquis. Patient voiced understanding. No further CM needs.Nsg updated.                  Action/Plan:d/c home.   Expected Discharge Date:  03/24/17               Expected Discharge Plan:  Home/Self Care  In-House Referral:     Discharge planning Services  CM Consult, Medication Assistance  Post Acute Care Choice:    Choice offered to:     DME Arranged:    DME Agency:     HH Arranged:    HH Agency:     Status of Service:  Completed, signed off  If discussed at [employer] of Stay Meetings, dates discussed:    Additional Comments:  Dessa Phi, RN 03/24/2017, 12:44 PM

## 2017-03-24 NOTE — Care Management Note (Signed)
Case Management Note  Patient Details  Name: Kristen [name] MRN: [id number] Date of Birth: 1966/05/26  Subjective/Objective:  51 y/o f admitted w/PE. From home. CM referral for Eliquis benefit check-await response.                  Action/Plan:d/c home.   Expected Discharge Date:  03/24/17               Expected Discharge Plan:  Home/Self Care  In-House Referral:     Discharge planning Services  CM Consult, Medication Assistance  Post Acute Care Choice:    Choice offered to:     DME Arranged:    DME Agency:     HH Arranged:    HH Agency:     Status of Service:  In process, will continue to follow  If discussed at Long Length of Stay Meetings, dates discussed:    Additional Comments:  Dessa Phi, RN 03/24/2017, 12:00 PM

## 2017-03-25 ENCOUNTER — Encounter: Payer: Self-pay | Admitting: Hematology and Oncology

## 2017-03-25 ENCOUNTER — Other Ambulatory Visit: Payer: Self-pay | Admitting: Hematology and Oncology

## 2017-03-25 DIAGNOSIS — I2699 Other pulmonary embolism without acute cor pulmonale: Secondary | ICD-10-CM

## 2017-03-25 DIAGNOSIS — D72820 Lymphocytosis (symptomatic): Secondary | ICD-10-CM | POA: Insufficient documentation

## 2017-03-26 ENCOUNTER — Encounter: Payer: Self-pay | Admitting: Physical Medicine & Rehabilitation

## 2017-03-26 ENCOUNTER — Encounter: Payer: Medicare HMO | Attending: Physical Medicine & Rehabilitation | Admitting: Physical Medicine & Rehabilitation

## 2017-03-26 VITALS — BP 113/80 | HR 90

## 2017-03-26 DIAGNOSIS — Z5181 Encounter for therapeutic drug level monitoring: Secondary | ICD-10-CM

## 2017-03-26 DIAGNOSIS — M47816 Spondylosis without myelopathy or radiculopathy, lumbar region: Secondary | ICD-10-CM

## 2017-03-26 DIAGNOSIS — Z794 Long term (current) use of insulin: Secondary | ICD-10-CM | POA: Insufficient documentation

## 2017-03-26 DIAGNOSIS — E669 Obesity, unspecified: Secondary | ICD-10-CM | POA: Diagnosis not present

## 2017-03-26 DIAGNOSIS — E119 Type 2 diabetes mellitus without complications: Secondary | ICD-10-CM | POA: Diagnosis not present

## 2017-03-26 DIAGNOSIS — Z7982 Long term (current) use of aspirin: Secondary | ICD-10-CM | POA: Insufficient documentation

## 2017-03-26 DIAGNOSIS — I10 Essential (primary) hypertension: Secondary | ICD-10-CM | POA: Insufficient documentation

## 2017-03-26 DIAGNOSIS — G8929 Other chronic pain: Secondary | ICD-10-CM | POA: Insufficient documentation

## 2017-03-26 DIAGNOSIS — M545 Low back pain: Secondary | ICD-10-CM | POA: Diagnosis not present

## 2017-03-26 DIAGNOSIS — Z79899 Other long term (current) drug therapy: Secondary | ICD-10-CM | POA: Diagnosis not present

## 2017-03-26 DIAGNOSIS — M4696 Unspecified inflammatory spondylopathy, lumbar region: Secondary | ICD-10-CM

## 2017-03-26 MED ORDER — OXYCODONE HCL 10 MG PO TABS: 10.0000 mg | ORAL_TABLET | Freq: Three times a day (TID) | ORAL | 0 refills | Status: DC | PRN

## 2017-03-26 NOTE — Patient Instructions (Signed)
PLEASE FEEL FREE TO CALL OUR OFFICE WITH ANY PROBLEMS OR QUESTIONS ([phone])      

## 2017-03-26 NOTE — Progress Notes (Signed)
Subjective:    Patient ID: Kristen [name], female    DOB: March 22, 1966, 51 y.o.   MRN: [id number]  HPI  Kristen [name] is here regarding her chronic pain. She was hospitalized for a PE last week and was just released on Monday. She has been independent at home despite the hospitalization.  She is feeling pretty well all considering. She is still wearing her compression stockings. Her a/c is eliquis   She remains on oxycodone for pain control. Her back seems to be holding steady from a pain standpoint. She keeps up with stretches as advised   Pain Inventory Average Pain 7 Pain Right Now 7 My pain is sharp, dull and aching  In the last 24 hours, has pain interfered with the following? General activity 7 Relation with others 7 Enjoyment of life 7 What TIME of day is your pain at its worst? daytime Sleep (in general) Fair  Pain is worse with: walking, bending, standing and some activites Pain improves with: rest and heat/ice Relief from Meds: 6  Mobility walk without assistance ability to climb steps?  no do you drive?  yes  Function disabled: date disabled 2/17 I need assistance with the following:  meal prep, household duties and shopping  Neuro/Psych spasms dizziness depression anxiety  Prior Studies Any changes since last visit?  no  Physicians involved in your care Any changes since last visit?  no   Family History  Problem Relation Age of Onset  . Hypertension Maternal Grandmother   . Stroke Maternal Grandmother   . Diabetes Maternal Grandfather   . Cancer Mother        lung  . Cancer Father   . Diabetes Sister   . Heart attack Neg Hx    Social History   Social History  . Marital status: Single    Spouse name: N/A  . Number of children: N/A  . Years of education: N/A   Social History Main Topics  . Smoking status: Never Smoker  . Smokeless tobacco: Never Used  . Alcohol use No  . Drug use: No  . Sexual activity: No   Other Topics Concern  . Not  on file   Social History Narrative  . No narrative on file   Past Surgical History:  Procedure Laterality Date  . ABDOMINAL SURGERY     hernia repair  . CHOLECYSTECTOMY  [id number]  . COLONOSCOPY WITH PROPOFOL N/A 07/31/2016   Procedure: COLONOSCOPY WITH PROPOFOL;  Surgeon: Arta Silence, MD;  Location: [facility] END[facility]COPY;  Service: Endoscopy;  Laterality: N/A;  . FOOT SURGERY Right 06/2007  . KNEE ARTHROSCOPY  09/2008   left  . KNEE ARTHROSCOPY  april 2011   left  . NASAL SINUS SURGERY    . NASAL SINUS SURGERY  1990  . SHOULDER SURGERY  2004   left   Past Medical History:  Diagnosis Date  . Anxiety   . Asthma   . 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 Depression   . Diabetes mellitus   . Hypertension   . Migraines   . Near syncope   . Obesity   . PCOS (polycystic ovarian syndrome)   . Peripheral edema   . Sleep apnea    uses cpap set on 10  . Stroke ([place]) 12/2011   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    LMP 03/07/2017   Opioid Risk Score:   Fall Risk Score:  `1  Depression screen PHQ 2/9  Depression screen PHQ  2/9 12/25/2016 07/01/2016  Decreased Interest 3 2  Down, Depressed, Hopeless 3 3  PHQ - 2 Score 6 5  Altered sleeping - 3  Tired, decreased energy - 3  Change in appetite - 1  Feeling bad or failure about yourself  - 3  Trouble concentrating - 1  Moving slowly or fidgety/restless - 2  Suicidal thoughts - 1  PHQ-9 Score - 19    Review of Systems  Constitutional: Negative.   HENT: Negative.   Eyes: Negative.   Respiratory: Negative.   Cardiovascular: Negative.   Gastrointestinal: Negative.   Endocrine: Negative.   Genitourinary: Negative.   Musculoskeletal: Negative.   Skin: Negative.   Allergic/Immunologic: Negative.   Neurological: Negative.   Hematological: Negative.   Psychiatric/Behavioral: Negative.   All other systems reviewed and are negative.      Objective:   Physical Exam  Constitutional: remains morbidly obese. Good color HENT:    Head: NCAT Neck: Normal range of motion. Neck supple.  Cardiovascular: RRR.  Pulmonary/Chest: normal effort Musculoskeletal: LLE 1+ edema, wearing compression sleaves Normal Muscle Bulk and Muscle Testing Reveals: Upper Extremities: Full ROM and Muscle Strength remains 5/5 Lumbar tendereness with extension and facet maneuvers. Flexed to 90 degrees with mild to moderate pain. Muscle Strength 5/5  Neurological: She is alertand oriented to person, place, and time.  Skin: legs are clear without breakdown.   Psychiatric: pleasant and appropriate .       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 Second RF for next month.  -We will continue the opioid monitoring program, this consists of regular clinic visits, examinations, urine drug screen, pill counts as well as use of [place] Controlled Substance Reporting System. [place].  -swab test for drug screen  -continue with HEP to maintain ROM and increase core strength.  -continue soma for muscle spasms. .  2. Morbid obesity: Continue Healthy Diet Regime and HEP  -she is more serious about this now after the recent hospitalization.  3. Type II diabetes: Dr. Buddy Duty following             -pt aware that CBG's will increase temporarily on prednisone 4. REactive depression:   -continuezoloft 200mg  daily which has helped 5. Recent PE: on eliquis per primary  -will need to stay on this for 12 months  45minutes of face to face patient care time wasspent during this visit. All questions were encouraged and answered. Follow up with NP next month.

## 2017-03-29 DIAGNOSIS — G4733 Obstructive sleep apnea (adult) (pediatric): Secondary | ICD-10-CM | POA: Diagnosis not present

## 2017-03-31 LAB — DRUG TOX MONITOR 1 W/CONF, ORAL FLD
Amphetamines: NEGATIVE ng/mL (ref <10)
Barbiturates: NEGATIVE ng/mL (ref <10)
Benzodiazepines: NEGATIVE ng/mL (ref <0.50)
Buprenorphine: NEGATIVE ng/mL (ref <0.025)
Carisoprodol: 10.6 ng/mL — ABNORMAL HIGH (ref <2.5)
Cocaine: NEGATIVE ng/mL (ref <2.5)
Codeine: NEGATIVE ng/mL (ref <2.5)
Dihydrocodeine: NEGATIVE ng/mL (ref <2.5)
Fentanyl: NEGATIVE ng/mL (ref <0.10)
Heroin Metabolite: NEGATIVE ng/mL (ref <1.0)
Hydrocodone: NEGATIVE ng/mL (ref <2.5)
Hydromorphone: NEGATIVE ng/mL (ref <2.5)
MDMA: NEGATIVE ng/mL (ref <10)
Marijuana: NEGATIVE ng/mL (ref <2.5)
Meperidine: NEGATIVE ng/mL (ref <5.0)
Meprobamate: 119.2 ng/mL — ABNORMAL HIGH (ref <2.5)
Meprobamate: POSITIVE ng/mL — AB (ref <2.5)
Methadone: NEGATIVE ng/mL (ref <5.0)
Morphine: NEGATIVE ng/mL (ref <2.5)
Nicotine Metabolite: NEGATIVE ng/mL (ref <5.0)
Norhydrocodone: NEGATIVE ng/mL (ref <2.5)
Noroxycodone: NEGATIVE ng/mL (ref <2.5)
Opiates: POSITIVE ng/mL — AB (ref <2.5)
Oxycodone: 4.5 ng/mL — ABNORMAL HIGH (ref <2.5)
Oxymorphone: NEGATIVE ng/mL (ref <2.5)
Phencyclidine: NEGATIVE ng/mL (ref <10)
Propoxyphene: NEGATIVE ng/mL (ref <5.0)
Tapentadol: NEGATIVE ng/mL (ref <5.0)
Tramadol: NEGATIVE ng/mL (ref <5.0)
Zolpidem: NEGATIVE ng/mL (ref <5.0)

## 2017-03-31 LAB — DRUG TOX METHYLPHEN W/CONF,ORAL FLD: Methylphenidate: NEGATIVE ng/mL (ref <1.0)

## 2017-03-31 LAB — DRUG TOX ALC METAB W/CON, ORAL FLD: Alcohol Metabolite: NEGATIVE ng/mL (ref <25)

## 2017-04-01 ENCOUNTER — Telehealth: Payer: Self-pay | Admitting: *Deleted

## 2017-04-01 DIAGNOSIS — Z794 Long term (current) use of insulin: Secondary | ICD-10-CM | POA: Diagnosis not present

## 2017-04-01 DIAGNOSIS — Z6841 Body Mass Index (BMI) 40.0 and over, adult: Secondary | ICD-10-CM | POA: Diagnosis not present

## 2017-04-01 DIAGNOSIS — E1165 Type 2 diabetes mellitus with hyperglycemia: Secondary | ICD-10-CM | POA: Diagnosis not present

## 2017-04-01 DIAGNOSIS — E1122 Type 2 diabetes mellitus with diabetic chronic kidney disease: Secondary | ICD-10-CM | POA: Diagnosis not present

## 2017-04-01 DIAGNOSIS — R609 Edema, unspecified: Secondary | ICD-10-CM | POA: Diagnosis not present

## 2017-04-01 DIAGNOSIS — N181 Chronic kidney disease, stage 1: Secondary | ICD-10-CM | POA: Diagnosis not present

## 2017-04-01 NOTE — Telephone Encounter (Signed)
Oral swab drug screen for this encounter is consistent for prescribed medication. 

## 2017-04-04 ENCOUNTER — Other Ambulatory Visit: Payer: Self-pay | Admitting: Women's Health

## 2017-04-04 DIAGNOSIS — Z1231 Encounter for screening mammogram for malignant neoplasm of breast: Secondary | ICD-10-CM

## 2017-04-08 DIAGNOSIS — G4733 Obstructive sleep apnea (adult) (pediatric): Secondary | ICD-10-CM | POA: Diagnosis not present

## 2017-04-08 DIAGNOSIS — M5137 Other intervertebral disc degeneration, lumbosacral region: Secondary | ICD-10-CM | POA: Diagnosis not present

## 2017-04-08 DIAGNOSIS — F411 Generalized anxiety disorder: Secondary | ICD-10-CM | POA: Diagnosis not present

## 2017-04-08 DIAGNOSIS — E1122 Type 2 diabetes mellitus with diabetic chronic kidney disease: Secondary | ICD-10-CM | POA: Diagnosis not present

## 2017-04-08 DIAGNOSIS — J45991 Cough variant asthma: Secondary | ICD-10-CM | POA: Diagnosis not present

## 2017-04-08 DIAGNOSIS — I2699 Other pulmonary embolism without acute cor pulmonale: Secondary | ICD-10-CM | POA: Diagnosis not present

## 2017-04-08 DIAGNOSIS — I10 Essential (primary) hypertension: Secondary | ICD-10-CM | POA: Diagnosis not present

## 2017-04-08 DIAGNOSIS — K0889 Other specified disorders of teeth and supporting structures: Secondary | ICD-10-CM | POA: Diagnosis not present

## 2017-04-08 DIAGNOSIS — R3915 Urgency of urination: Secondary | ICD-10-CM | POA: Diagnosis not present

## 2017-04-08 DIAGNOSIS — E7800 Pure hypercholesterolemia, unspecified: Secondary | ICD-10-CM | POA: Diagnosis not present

## 2017-04-14 ENCOUNTER — Telehealth: Payer: Self-pay | Admitting: Hematology and Oncology

## 2017-04-14 ENCOUNTER — Ambulatory Visit (HOSPITAL_BASED_OUTPATIENT_CLINIC_OR_DEPARTMENT_OTHER): Payer: Medicare HMO | Admitting: Hematology and Oncology

## 2017-04-14 ENCOUNTER — Other Ambulatory Visit (HOSPITAL_COMMUNITY)
Admission: RE | Admit: 2017-04-14 | Discharge: 2017-04-14 | Disposition: A | Discharge status: Home / Self Care | Payer: Medicare HMO | Source: Ambulatory Visit | Attending: Hematology and Oncology | Admitting: Hematology and Oncology

## 2017-04-14 ENCOUNTER — Other Ambulatory Visit (HOSPITAL_BASED_OUTPATIENT_CLINIC_OR_DEPARTMENT_OTHER): Payer: Medicare HMO

## 2017-04-14 ENCOUNTER — Other Ambulatory Visit: Payer: Self-pay | Admitting: Hematology and Oncology

## 2017-04-14 ENCOUNTER — Encounter: Payer: Self-pay | Admitting: Hematology and Oncology

## 2017-04-14 DIAGNOSIS — I2699 Other pulmonary embolism without acute cor pulmonale: Secondary | ICD-10-CM

## 2017-04-14 DIAGNOSIS — D72820 Lymphocytosis (symptomatic): Secondary | ICD-10-CM

## 2017-04-14 DIAGNOSIS — E119 Type 2 diabetes mellitus without complications: Secondary | ICD-10-CM | POA: Diagnosis not present

## 2017-04-14 LAB — COMPREHENSIVE METABOLIC PANEL
ALT: 24 U/L (ref 0–55)
AST: 24 U/L (ref 5–34)
Albumin: 3.4 g/dL — ABNORMAL LOW (ref 3.5–5.0)
Alkaline Phosphatase: 75 U/L (ref 40–150)
Anion Gap: 7 mEq/L (ref 3–11)
BUN: 16.3 mg/dL (ref 7.0–26.0)
CO2: 27 mEq/L (ref 22–29)
Calcium: 9.8 mg/dL (ref 8.4–10.4)
Chloride: 104 mEq/L (ref 98–109)
Creatinine: 1.2 mg/dL — ABNORMAL HIGH (ref 0.6–1.1)
EGFR: 52 mL/min/{1.73_m2} — ABNORMAL LOW (ref >90)
Glucose: 257 mg/dl — ABNORMAL HIGH (ref 70–140)
Potassium: 5.1 mEq/L (ref 3.5–5.1)
Sodium: 138 mEq/L (ref 136–145)
Total Bilirubin: 0.4 mg/dL (ref 0.20–1.20)
Total Protein: 7.1 g/dL (ref 6.4–8.3)

## 2017-04-14 LAB — CBC WITH DIFFERENTIAL/PLATELET
BASO%: 0.2 % (ref 0.0–2.0)
Basophils Absolute: 0 10*3/uL (ref 0.0–0.1)
EOS%: 0.9 % (ref 0.0–7.0)
Eosinophils Absolute: 0.1 10*3/uL (ref 0.0–0.5)
HCT: 39.7 % (ref 34.8–46.6)
HGB: 13.1 g/dL (ref 11.6–15.9)
LYMPH%: 49.7 % (ref 14.0–49.7)
MCH: 29.5 pg (ref 25.1–34.0)
MCHC: 33.1 g/dL (ref 31.5–36.0)
MCV: 89.3 fL (ref 79.5–101.0)
MONO#: 0.8 10*3/uL (ref 0.1–0.9)
MONO%: 7.7 % (ref 0.0–14.0)
NEUT#: 4.1 10*3/uL (ref 1.5–6.5)
NEUT%: 41.5 % (ref 38.4–76.8)
Platelets: 294 10*3/uL (ref 145–400)
RBC: 4.44 10*6/uL (ref 3.70–5.45)
RDW: 14.9 % — ABNORMAL HIGH (ref 11.2–14.5)
WBC: 9.9 10*3/uL (ref 3.9–10.3)
lymph#: 4.9 10*3/uL — ABNORMAL HIGH (ref 0.9–3.3)

## 2017-04-14 LAB — TECHNOLOGIST REVIEW

## 2017-04-14 NOTE — Telephone Encounter (Signed)
Scheduled appt per 6/25 los - Gave patient AVS and calender per los.  

## 2017-04-14 NOTE — Assessment & Plan Note (Signed)
She has significant cardiovascular risk factors with poorly controlled diabetes, hypertension and other major cardiovascular comorbidities I recommend she continue aspirin therapy while on Eliquis The patient is warned about risk of bleeding on dual antiplatelet agent and anticoagulation treatment.

## 2017-04-14 NOTE — Progress Notes (Signed)
[place] OFFICE PROGRESS NOTE  Lawerance Cruel, MD SUMMARY OF HEMATOLOGIC HISTORY: Please see my detailed consult note dated 03/23/2017 for further details Kristen [name] 51 y.o. female is seen at the request by hospitalist due to DVT and PE and abnormal lymphocytosis This patient is morbidly obese with significant cardiovascular risk factors including poorly controlled diabetes, hypertension, prior history of stroke and others. According to the patient, she had accidental fall at home a month ago but did not sustain major injury The patient also had poorly controlled asthma and was started on steroid injection along with oral corticosteroids. That in turn cause significant urinary frequency presumably due to severe uncontrolled hyperglycemia at home. She was also started on diuretic for fluid retention and admits she is drinking less liquid and possibly dehydrated prior to admission. Her mobility is poor due to recent fall and asthma. Subsequently, she started to feel progressive shortness of breath and chest pressure. She was directed to the emergency department and underwent ultrasound venous Doppler along with CT imaging which show significant changes suggestive of massive PE. She is currently on IV heparin.  She denies lower extremity warmth, tenderness & erythema. She has chronic leg swelling and recurrent cellulitis with abnormal skin changes on the legs Since admission, her chest pressure and shortness of breath have improved a little bit.  She denies recent pre-syncopal episodes, hemoptysis, or palpitation. She denies recent long distance travel, recent surgery or smoking She had no prior history or diagnosis of cancer. Her age appropriate screening programs are up-to-date. She had prior surgeries before and never had perioperative thromboembolic events. The patient had never been pregnant before There is no family history of blood clots or miscarriages. The patient was  subsequently discharged from the hospital on Eliquis along with 81 mg aspirin INTERVAL HISTORY: Kristen [name] 51 y.o. female returns for further follow-up. She had recent menstrual cycle without excessive bleeding The patient denies any recent signs or symptoms of bleeding such as spontaneous epistaxis, hematuria or hematochezia. She is doing very well Denies recent chest pain or shortness of breath She was able to get 0 co-pay for Eliquis She has poor dentition requiring dental procedure soon but she denies excessive pain right now  I have reviewed the past medical history, past surgical history, social history and family history with the patient and they are unchanged from previous note.  ALLERGIES:  is allergic to cafergot; glucophage [metformin hcl]; and canagliflozin.  MEDICATIONS:  Current Outpatient Prescriptions  Medication Sig Dispense Refill  . ACCU-CHEK AVIVA PLUS test strip USE AS DIRECTED TWICE A DAY IN VITRO  12  . albuterol (ACCUNEB) 1.25 MG/3ML nebulizer solution Inhale 1 ampule into the lungs 3 (three) times daily as needed for wheezing or shortness of breath.     Marland Kitchen albuterol (VENTOLIN HFA) 108 (90 Base) MCG/ACT inhaler Inhale 2 puffs into the lungs every 4 (four) hours as needed for wheezing or shortness of breath.     Marland Kitchen apixaban (ELIQUIS) 5 MG TABS tablet Take two tabs twice a day for 7 days, then take one tab twice a day for a year. 74 tablet 0  . aspirin 81 MG tablet Take 81 mg by mouth daily.    Marland Kitchen atorvastatin (LIPITOR) 40 MG tablet Take 40 mg by mouth daily.    . BD PEN NEEDLE NANO U/F 32G X 4 MM MISC 2 (two) times daily. as directed  11  . carisoprodol (SOMA) 350 MG tablet Take 1 tablet (  350 mg total) by mouth every 8 (eight) hours as needed for muscle spasms. 75 tablet 2  . Cholecalciferol (VITAMIN D3) 2000 units capsule Take 2,000 Units by mouth daily.     . Fluticasone-Salmeterol (ADVAIR) 500-50 MCG/DOSE AEPB Inhale 1 puff into the lungs every 12 (twelve)  hours.    . insulin aspart (NOVOLOG FLEXPEN) 100 UNIT/ML FlexPen Inject 15 Units into the skin 3 (three) times daily with meals. 15 mL 11  . insulin glargine (LANTUS) 100 UNIT/ML injection Inject 80-100 Units into the skin at bedtime.     Marland Kitchen lisinopril (PRINIVIL,ZESTRIL) 20 MG tablet Take 1 tablet (20 mg total) by mouth daily. 30 tablet 0  . montelukast (SINGULAIR) 10 MG tablet Take 10 mg by mouth at bedtime.     Marland Kitchen oxybutynin (DITROPAN) 5 MG tablet TAKE 1 TABLET BY MOUTH TWICE A DAY AS NEEDED FOR BLADDER  3  . Oxycodone HCl 10 MG TABS Take 1 tablet (10 mg total) by mouth every 8 (eight) hours as needed. Take one tablet twice a day as needed 90 tablet 0  . promethazine (PHENERGAN) 25 MG tablet Take 25 mg by mouth every 6 (six) hours as needed.     . senna-docusate (SENOKOT-S) 8.6-50 MG tablet Take 1 tablet by mouth at bedtime. 30 tablet 0  . sertraline (ZOLOFT) 100 MG tablet Take 200 mg by mouth daily.     Marland Kitchen torsemide (DEMADEX) 20 MG tablet Take 20 mg by mouth daily.    Marland Kitchen VICTOZA 18 MG/3ML SOPN INJECT 1.8ML SUBCUTANEOUSLY ONCE DAILY FOR 30 DAYS  11   No current facility-administered medications for this visit.      REVIEW OF SYSTEMS:   Constitutional: Denies fevers, chills or night sweats Eyes: Denies blurriness of vision Ears, nose, mouth, throat, and face: Denies mucositis or sore throat Respiratory: Denies cough, dyspnea or wheezes Cardiovascular: Denies palpitation, chest discomfort or lower extremity swelling Gastrointestinal:  Denies nausea, heartburn or change in bowel habits Skin: Denies abnormal skin rashes Lymphatics: Denies new lymphadenopathy or easy bruising Neurological:Denies numbness, tingling or new weaknesses Behavioral/Psych: Mood is stable, no new changes  All other systems were reviewed with the patient and are negative.  PHYSICAL EXAMINATION: ECOG PERFORMANCE STATUS: 2 - Symptomatic, <50% confined to bed  Vitals:   04/14/17 1405  BP: 134/77  Pulse: 96  Resp: 20   Temp: 97.8 F (36.6 C)   Filed Weights   04/14/17 1405  Weight: (!) 381 lb 14.4 oz (173.2 kg)    GENERAL:alert, no distress and comfortable.  She is morbidly obese SKIN: Noted some skin bruising.  Noted chronic venous stasis changes on both legs EYES: normal, Conjunctiva are pink and non-injected, sclera clear OROPHARYNX:no exudate, no erythema and lips, buccal mucosa, and tongue normal  NECK: supple, thyroid normal size, non-tender, without nodularity LYMPH:  no palpable lymphadenopathy in the cervical, axillary or inguinal LUNGS: clear to auscultation and percussion with normal breathing effort HEART: regular rate & rhythm and no murmurs with moderate bilateral lower extremity edema ABDOMEN:abdomen soft, non-tender and normal bowel sounds Musculoskeletal:no cyanosis of digits and no clubbing  NEURO: alert & oriented x 3 with fluent speech, no focal motor/sensory deficits  LABORATORY DATA:  I have reviewed the data as listed     Component Value Date/Time   NA 138 04/14/2017 1348   K 5.1 04/14/2017 1348   CL 108 03/24/2017 0430   CO2 27 04/14/2017 1348   GLUCOSE 257 (H) 04/14/2017 1348   BUN 16.3 04/14/2017  1348   CREATININE 1.2 (H) 04/14/2017 1348   CALCIUM 9.8 04/14/2017 1348   PROT 7.1 04/14/2017 1348   ALBUMIN 3.4 (L) 04/14/2017 1348   AST 24 04/14/2017 1348   ALT 24 04/14/2017 1348   ALKPHOS 75 04/14/2017 1348   BILITOT 0.40 04/14/2017 1348   GFRNONAA 59 (L) 03/24/2017 0430   GFRAA >60 03/24/2017 0430    No results found for: SPEP, UPEP  Lab Results  Component Value Date   WBC 9.9 04/14/2017   NEUTROABS 4.1 04/14/2017   HGB 13.1 04/14/2017   HCT 39.7 04/14/2017   MCV 89.3 04/14/2017   PLT 294 04/14/2017      Chemistry      Component Value Date/Time   NA 138 04/14/2017 1348   K 5.1 04/14/2017 1348   CL 108 03/24/2017 0430   CO2 27 04/14/2017 1348   BUN 16.3 04/14/2017 1348   CREATININE 1.2 (H) 04/14/2017 1348      Component Value Date/Time    CALCIUM 9.8 04/14/2017 1348   ALKPHOS 75 04/14/2017 1348   AST 24 04/14/2017 1348   ALT 24 04/14/2017 1348   BILITOT 0.40 04/14/2017 1348      ASSESSMENT & PLAN:  Pulmonary embolism ([place]) She is doing well without any complications from excessive bleeding being on anticoagulation treatment Minimum duration of treatment would be at least for 1 year, and to June 2019 I warned her about risk of bleeding with combination treatment along with antiplatelet agents She would need repeat blood work, history and physical examination at least twice a year while on treatment She also desire potential dental procedure due to poor dentition I recommend she hold off at least until after June 21, 2017 to allow at least 90 days of uninterrupted treatment with anticoagulation therapy She would need to be on hold for at least 48 hours being off Eliquis before she proceed with dental procedure  Lymphocytosis Flow cytometry did not detect any monoclonal abnormalities Her lymphocytosis has improved I recommend close observation  Diabetes mellitus type 2, uncomplicated She has significant cardiovascular risk factors with poorly controlled diabetes, hypertension and other major cardiovascular comorbidities I recommend she continue aspirin therapy while on Eliquis The patient is warned about risk of bleeding on dual antiplatelet agent and anticoagulation treatment.   No orders of the defined types were placed in this encounter.   All questions were answered. The patient knows to call the clinic with any problems, questions or concerns. No barriers to learning was detected.  I spent 15 minutes counseling the patient face to face. The total time spent in the appointment was 20 minutes and more than 50% was on counseling.     Heath Lark, MD 6/25/20182:52 PM

## 2017-04-14 NOTE — Assessment & Plan Note (Signed)
She is doing well without any complications from excessive bleeding being on anticoagulation treatment Minimum duration of treatment would be at least for 1 year, and to June 2019 I warned her about risk of bleeding with combination treatment along with antiplatelet agents She would need repeat blood work, history and physical examination at least twice a year while on treatment She also desire potential dental procedure due to poor dentition I recommend she hold off at least until after June 21, 2017 to allow at least 90 days of uninterrupted treatment with anticoagulation therapy She would need to be on hold for at least 48 hours being off Eliquis before she proceed with dental procedure

## 2017-04-14 NOTE — Assessment & Plan Note (Signed)
Flow cytometry did not detect any monoclonal abnormalities Her lymphocytosis has improved I recommend close observation

## 2017-04-15 ENCOUNTER — Ambulatory Visit: Payer: Medicare HMO

## 2017-04-16 LAB — FLOW CYTOMETRY

## 2017-04-22 ENCOUNTER — Ambulatory Visit: Payer: Medicare HMO

## 2017-05-02 ENCOUNTER — Ambulatory Visit
Admission: RE | Admit: 2017-05-02 | Discharge: 2017-05-02 | Disposition: A | Discharge status: Home / Self Care | Payer: Medicare HMO | Source: Ambulatory Visit | Attending: Women's Health | Admitting: Women's Health

## 2017-05-02 DIAGNOSIS — Z1231 Encounter for screening mammogram for malignant neoplasm of breast: Secondary | ICD-10-CM

## 2017-05-05 ENCOUNTER — Encounter: Payer: Self-pay | Admitting: Women's Health

## 2017-05-21 ENCOUNTER — Encounter: Payer: Self-pay | Admitting: Physical Medicine & Rehabilitation

## 2017-05-21 ENCOUNTER — Encounter: Payer: Medicare HMO | Attending: Physical Medicine & Rehabilitation | Admitting: Physical Medicine & Rehabilitation

## 2017-05-21 VITALS — BP 123/85 | HR 97

## 2017-05-21 DIAGNOSIS — M791 Myalgia: Secondary | ICD-10-CM | POA: Diagnosis not present

## 2017-05-21 DIAGNOSIS — E669 Obesity, unspecified: Secondary | ICD-10-CM | POA: Insufficient documentation

## 2017-05-21 DIAGNOSIS — Z7982 Long term (current) use of aspirin: Secondary | ICD-10-CM | POA: Diagnosis not present

## 2017-05-21 DIAGNOSIS — Z5181 Encounter for therapeutic drug level monitoring: Secondary | ICD-10-CM | POA: Diagnosis not present

## 2017-05-21 DIAGNOSIS — G8929 Other chronic pain: Secondary | ICD-10-CM | POA: Diagnosis not present

## 2017-05-21 DIAGNOSIS — I10 Essential (primary) hypertension: Secondary | ICD-10-CM | POA: Insufficient documentation

## 2017-05-21 DIAGNOSIS — M4696 Unspecified inflammatory spondylopathy, lumbar region: Secondary | ICD-10-CM

## 2017-05-21 DIAGNOSIS — Z79899 Other long term (current) drug therapy: Secondary | ICD-10-CM | POA: Insufficient documentation

## 2017-05-21 DIAGNOSIS — Z794 Long term (current) use of insulin: Secondary | ICD-10-CM | POA: Diagnosis not present

## 2017-05-21 DIAGNOSIS — M47816 Spondylosis without myelopathy or radiculopathy, lumbar region: Secondary | ICD-10-CM

## 2017-05-21 DIAGNOSIS — M545 Low back pain: Secondary | ICD-10-CM | POA: Insufficient documentation

## 2017-05-21 DIAGNOSIS — E119 Type 2 diabetes mellitus without complications: Secondary | ICD-10-CM | POA: Insufficient documentation

## 2017-05-21 DIAGNOSIS — M7918 Myalgia, other site: Secondary | ICD-10-CM

## 2017-05-21 MED ORDER — OXYCODONE HCL 10 MG PO TABS: 10.0000 mg | ORAL_TABLET | Freq: Three times a day (TID) | ORAL | 0 refills | Status: DC | PRN
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
## 2017-05-21 NOTE — Patient Instructions (Signed)
PLEASE FEEL FREE TO CALL OUR OFFICE WITH ANY PROBLEMS OR QUESTIONS ([phone])      

## 2017-05-30 ENCOUNTER — Telehealth: Payer: Self-pay | Admitting: *Deleted

## 2017-05-30 NOTE — Telephone Encounter (Signed)
Kristen [name] called and says that her pain is just not being relieved.  She is taking oxycodone and can take it up to tid and she has carisoprodol and generally takes it once per day though the directions say may take up to tid.  I encouraged her to try taking the meds tid and using ice alternating with heat to see if that helps relieve her discomfort.  If she is still not getting relief she would need to go to urgent care or ED to be evaluated as Dr Naaman Plummer is not in the office (vacation) through Monday.  She will try this and she asked to move 07/21/17 appt up. I transferred her to appts and if Dr Naaman Plummer does not have anything she could see NP.

## 2017-06-09 DIAGNOSIS — Z6841 Body Mass Index (BMI) 40.0 and over, adult: Secondary | ICD-10-CM | POA: Diagnosis not present

## 2017-06-09 DIAGNOSIS — E1122 Type 2 diabetes mellitus with diabetic chronic kidney disease: Secondary | ICD-10-CM | POA: Diagnosis not present

## 2017-06-09 DIAGNOSIS — G4733 Obstructive sleep apnea (adult) (pediatric): Secondary | ICD-10-CM | POA: Diagnosis not present

## 2017-06-09 DIAGNOSIS — E1165 Type 2 diabetes mellitus with hyperglycemia: Secondary | ICD-10-CM | POA: Diagnosis not present

## 2017-06-09 DIAGNOSIS — R609 Edema, unspecified: Secondary | ICD-10-CM | POA: Diagnosis not present

## 2017-06-09 DIAGNOSIS — Z794 Long term (current) use of insulin: Secondary | ICD-10-CM | POA: Diagnosis not present

## 2017-06-09 DIAGNOSIS — N181 Chronic kidney disease, stage 1: Secondary | ICD-10-CM | POA: Diagnosis not present

## 2017-07-03 DIAGNOSIS — E1122 Type 2 diabetes mellitus with diabetic chronic kidney disease: Secondary | ICD-10-CM | POA: Diagnosis not present

## 2017-07-03 DIAGNOSIS — M549 Dorsalgia, unspecified: Secondary | ICD-10-CM | POA: Diagnosis not present

## 2017-07-03 DIAGNOSIS — I8310 Varicose veins of unspecified lower extremity with inflammation: Secondary | ICD-10-CM | POA: Diagnosis not present

## 2017-07-03 DIAGNOSIS — F324 Major depressive disorder, single episode, in partial remission: Secondary | ICD-10-CM | POA: Diagnosis not present

## 2017-07-03 DIAGNOSIS — J45991 Cough variant asthma: Secondary | ICD-10-CM | POA: Diagnosis not present

## 2017-07-03 DIAGNOSIS — Z0000 Encounter for general adult medical examination without abnormal findings: Secondary | ICD-10-CM | POA: Diagnosis not present

## 2017-07-03 DIAGNOSIS — Z23 Encounter for immunization: Secondary | ICD-10-CM | POA: Diagnosis not present

## 2017-07-03 DIAGNOSIS — R609 Edema, unspecified: Secondary | ICD-10-CM | POA: Diagnosis not present

## 2017-07-14 DIAGNOSIS — G4733 Obstructive sleep apnea (adult) (pediatric): Secondary | ICD-10-CM | POA: Diagnosis not present

## 2017-07-15 ENCOUNTER — Encounter: Payer: Medicare HMO | Admitting: Physical Medicine & Rehabilitation

## 2017-07-21 ENCOUNTER — Ambulatory Visit: Payer: Medicare HMO | Admitting: Physical Medicine & Rehabilitation

## 2017-07-29 ENCOUNTER — Encounter: Payer: Medicare HMO | Attending: Physical Medicine & Rehabilitation | Admitting: Physical Medicine & Rehabilitation

## 2017-07-29 ENCOUNTER — Encounter: Payer: Self-pay | Admitting: Physical Medicine & Rehabilitation

## 2017-07-29 DIAGNOSIS — E119 Type 2 diabetes mellitus without complications: Secondary | ICD-10-CM | POA: Insufficient documentation

## 2017-07-29 DIAGNOSIS — Z5181 Encounter for therapeutic drug level monitoring: Secondary | ICD-10-CM | POA: Insufficient documentation

## 2017-07-29 DIAGNOSIS — M47816 Spondylosis without myelopathy or radiculopathy, lumbar region: Secondary | ICD-10-CM

## 2017-07-29 DIAGNOSIS — M545 Low back pain: Secondary | ICD-10-CM | POA: Insufficient documentation

## 2017-07-29 DIAGNOSIS — I10 Essential (primary) hypertension: Secondary | ICD-10-CM | POA: Insufficient documentation

## 2017-07-29 DIAGNOSIS — E669 Obesity, unspecified: Secondary | ICD-10-CM | POA: Insufficient documentation

## 2017-07-29 DIAGNOSIS — Z79899 Other long term (current) drug therapy: Secondary | ICD-10-CM | POA: Diagnosis not present

## 2017-07-29 DIAGNOSIS — Z794 Long term (current) use of insulin: Secondary | ICD-10-CM | POA: Insufficient documentation

## 2017-07-29 DIAGNOSIS — Z7982 Long term (current) use of aspirin: Secondary | ICD-10-CM | POA: Diagnosis not present

## 2017-07-29 DIAGNOSIS — G8929 Other chronic pain: Secondary | ICD-10-CM | POA: Diagnosis not present

## 2017-07-29 MED ORDER — OXYCODONE HCL 10 MG PO TABS: 10.0000 mg | ORAL_TABLET | Freq: Three times a day (TID) | ORAL | 0 refills | Status: DC | PRN

## 2017-07-29 MED ORDER — CARISOPRODOL 350 MG PO TABS: 350.0000 mg | ORAL_TABLET | Freq: Three times a day (TID) | ORAL | 2 refills | Status: DC

## 2017-07-29 NOTE — Patient Instructions (Signed)
CONTINUE TO WORK ON YOUR STRETCHING AND GOOD POSTURE AT HOME  PLEASE FEEL FREE TO CALL OUR OFFICE WITH ANY PROBLEMS OR QUESTIONS ([phone])

## 2017-07-29 NOTE — Addendum Note (Signed)
Addended by: Alger Simons T on: 07/29/2017 01:25 PM   Modules accepted: Orders

## 2017-07-29 NOTE — Progress Notes (Signed)
Subjective:    Patient ID: Kristen [name], female    DOB: [birth year], 51 y.o.   MRN: [id number]  HPI   Kristen [name] is here in follow up of her chronic pain. She had minimal relief with the TPI's from last visit. She is doing some stretches at home but is very limited with exercise outside of her house. She feels that she's doing ok and that her meds are generally effective.   She remains on oxycodone 10mg  q8 prn and soma for spasm three x daily.   Her mood has been fairly up beat. She's trying to eat better and has noticed that her hgb A1C is dropping.   Pain Inventory Average Pain 7 Pain Right Now 9 My pain is constant, sharp, stabbing and aching  In the last 24 hours, has pain interfered with the following? General activity 8 Relation with others 10 Enjoyment of life 10 What TIME of day is your pain at its worst? all Sleep (in general) Fair  Pain is worse with: walking, bending, sitting, standing and some activites Pain improves with: rest, heat/ice and medication Relief from Meds: 5  Mobility ability to climb steps?  no do you drive?  yes  Function disabled: date disabled 2018  Neuro/Psych spasms depression anxiety  Prior Studies Any changes since last visit?  no  Physicians involved in your care Any changes since last visit?  no   Family History  Problem Relation Age of Onset  . Hypertension Maternal Grandmother   . Stroke Maternal Grandmother   . Diabetes Maternal Grandfather   . Cancer Mother        lung  . Cancer Father   . Diabetes Sister   . Breast cancer Cousin   . Heart attack Neg Hx    Social History   Social History  . Marital status: Single    Spouse name: N/A  . Number of children: N/A  . Years of education: N/A   Social History Main Topics  . Smoking status: Never Smoker  . Smokeless tobacco: Never Used  . Alcohol use No  . Drug use: No  . Sexual activity: No   Other Topics Concern  . Not on file   Social History Narrative  .  No narrative on file   Past Surgical History:  Procedure Laterality Date  . ABDOMINAL SURGERY     hernia repair  . CHOLECYSTECTOMY  [id number]  . COLONOSCOPY WITH PROPOFOL N/A 07/31/2016   Procedure: COLONOSCOPY WITH PROPOFOL;  Surgeon: Arta Silence, MD;  Location: [facility];  Service: Endoscopy;  Laterality: N/A;  . FOOT SURGERY Right 06/2007  . KNEE ARTHROSCOPY  09/2008   left  . KNEE ARTHROSCOPY  april 2011   left  . NASAL SINUS SURGERY    . NASAL SINUS SURGERY  1990  . SHOULDER SURGERY  2004   left   Past Medical History:  Diagnosis Date  . Anxiety   . Asthma   . 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 Depression   . Diabetes mellitus   . Hypertension   . Migraines   . Near syncope   . Obesity   . PCOS (polycystic ovarian syndrome)   . Peripheral edema   . Sleep apnea    uses cpap set on 10  . Stroke ([place]) 12/2011   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    There were no vitals taken for this visit.  Opioid Risk Score:   Fall Risk Score:  `  1  Depression screen PHQ 2/9  Depression screen [facility] 2/9 12/25/2016 07/01/2016  Decreased Interest 3 2  Down, Depressed, Hopeless 3 3  PHQ - 2 Score 6 5  Altered sleeping - 3  Tired, decreased energy - 3  Change in appetite - 1  Feeling bad or failure about yourself  - 3  Trouble concentrating - 1  Moving slowly or fidgety/restless - 2  Suicidal thoughts - 1  PHQ-9 Score - 19     Review of Systems  Constitutional: Negative.   HENT: Negative.   Eyes: Negative.   Respiratory: Negative.   Cardiovascular: Negative.   Gastrointestinal: Negative.   Endocrine: Negative.   Genitourinary: Negative.   Musculoskeletal: Negative.   Skin: Negative.   Allergic/Immunologic: Negative.   Neurological: Negative.   Hematological: Negative.   Psychiatric/Behavioral: Negative.   All other systems reviewed and are negative.      Objective:   Physical Exam  Constitutional: obese.   HENT:  Head: NCAT Neck: Normal range of  motion. Neck supple.  Cardiovascular: RRR.  Pulmonary/Chest: normal effort Musculoskeletal: LLE tr to 1+ edema Normal Muscle Bulk and Muscle Testing Reveals: Upper Extremities: Full ROM and Muscle Strength remains 5/5 Lumbar tendereness with extension and facet maneuvers persists. Needs extra time to stand. Wide based gait.  Neurological: She is alertand oriented to person, place, and time.  Skin: legs are clear and intact.  Psychiatric: pleasant and appropriate .       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 second RF for next month.  We will continue the opioid monitoring program, this consists of regular clinic visits, examinations, routine drug screening, pill counts as well as use of [place] Controlled Substance Reporting System. [place] was reviewed today.   2. Morbid obesity: Continue Healthy Diet Regime and HEP -reiterated need for weight loss with patient  3. Type II diabetes: Dr. Buddy Duty following -continue per endocrine fecs. Still needs work 4. Reactive depression:  -improved onzoloft 200mg  daily   5. Recent PE: on eliquis per primary -will need to stay on this for 12 months  10 min of face to face patient care time wasspent during this visit. All questions were encouraged and answered. Follow up with NP in 2 months.

## 2017-09-03 ENCOUNTER — Telehealth: Payer: Self-pay | Admitting: *Deleted

## 2017-09-03 ENCOUNTER — Other Ambulatory Visit: Payer: Self-pay | Admitting: Physical Medicine & Rehabilitation

## 2017-09-03 DIAGNOSIS — M47816 Spondylosis without myelopathy or radiculopathy, lumbar region: Secondary | ICD-10-CM

## 2017-09-03 NOTE — Telephone Encounter (Signed)
rec'd an electronic request to refill soma.  I also retrieved a phone message on this subject as well from the patient.  The patient explains that she was given a paper script at last visit.  She submitted it to [facility] via mail  For mail order delivery service.  She was under the impression that they were going to fill it.  However, according to the patient, they are not going to fill the prescription as it is not covered.  This leaves the option of getting a new script and having phoned into [facility] where she can acquire the Rx at cash price with a Good Rx Card.  She adds that she researched Good[facility] and it is cheaper for her to get #90 as opposed to #75.  She is asking if we could call in 90 tabs of carisoprodol 350 mg to her [facility] pharmacy

## 2017-09-03 NOTE — Telephone Encounter (Signed)
Please see telephone encounter, patient requesting

## 2017-09-03 NOTE — Telephone Encounter (Signed)
That would be fine 

## 2017-09-04 MED ORDER — CARISOPRODOL 350 MG PO TABS: 350.0000 mg | ORAL_TABLET | Freq: Three times a day (TID) | ORAL | 2 refills | Status: DC

## 2017-09-04 NOTE — Telephone Encounter (Signed)
Medication ordered, patient notified

## 2017-09-06 ENCOUNTER — Other Ambulatory Visit: Payer: Self-pay | Admitting: Women's Health

## 2017-09-06 DIAGNOSIS — B373 Candidiasis of vulva and vagina: Secondary | ICD-10-CM

## 2017-09-06 DIAGNOSIS — B3731 Acute candidiasis of vulva and vagina: Secondary | ICD-10-CM

## 2017-09-08 NOTE — Telephone Encounter (Signed)
Appointment 09/17/17

## 2017-09-17 ENCOUNTER — Encounter: Payer: Self-pay | Admitting: Women's Health

## 2017-09-26 ENCOUNTER — Encounter: Payer: Medicare HMO | Attending: Physical Medicine & Rehabilitation | Admitting: Registered Nurse

## 2017-09-26 ENCOUNTER — Telehealth: Payer: Self-pay | Admitting: Registered Nurse

## 2017-09-26 ENCOUNTER — Encounter: Payer: Self-pay | Admitting: Registered Nurse

## 2017-09-26 VITALS — BP 143/74 | HR 78

## 2017-09-26 DIAGNOSIS — Z794 Long term (current) use of insulin: Secondary | ICD-10-CM | POA: Diagnosis not present

## 2017-09-26 DIAGNOSIS — G8929 Other chronic pain: Secondary | ICD-10-CM | POA: Insufficient documentation

## 2017-09-26 DIAGNOSIS — G894 Chronic pain syndrome: Secondary | ICD-10-CM | POA: Diagnosis not present

## 2017-09-26 DIAGNOSIS — F329 Major depressive disorder, single episode, unspecified: Secondary | ICD-10-CM

## 2017-09-26 DIAGNOSIS — M545 Low back pain: Secondary | ICD-10-CM | POA: Insufficient documentation

## 2017-09-26 DIAGNOSIS — M7918 Myalgia, other site: Secondary | ICD-10-CM

## 2017-09-26 DIAGNOSIS — Z5181 Encounter for therapeutic drug level monitoring: Secondary | ICD-10-CM | POA: Diagnosis not present

## 2017-09-26 DIAGNOSIS — I10 Essential (primary) hypertension: Secondary | ICD-10-CM | POA: Insufficient documentation

## 2017-09-26 DIAGNOSIS — E669 Obesity, unspecified: Secondary | ICD-10-CM | POA: Diagnosis not present

## 2017-09-26 DIAGNOSIS — M47816 Spondylosis without myelopathy or radiculopathy, lumbar region: Secondary | ICD-10-CM | POA: Diagnosis not present

## 2017-09-26 DIAGNOSIS — Z7982 Long term (current) use of aspirin: Secondary | ICD-10-CM | POA: Insufficient documentation

## 2017-09-26 DIAGNOSIS — E119 Type 2 diabetes mellitus without complications: Secondary | ICD-10-CM | POA: Diagnosis not present

## 2017-09-26 DIAGNOSIS — Z79899 Other long term (current) drug therapy: Secondary | ICD-10-CM | POA: Diagnosis not present

## 2017-09-26 MED ORDER — OXYCODONE HCL 10 MG PO TABS: 10.0000 mg | ORAL_TABLET | Freq: Three times a day (TID) | ORAL | 0 refills | Status: DC | PRN

## 2017-09-26 NOTE — Progress Notes (Signed)
Subjective:    Patient ID: Kristen [name], female    DOB: 25-Jan-1966, 51 y.o.   MRN: [id number]  HPI: Kristen [name] 51 year old female who returns for follow up appointment and medication refill. She states her pain is located in her lower back. She rates her pain 8. Her current exercise regime is walking.  Kristen [name] Morphine equivalent is 45.00 MME.     Oral Swab was performed on 03/26/2017 it was consistent.   Pain Inventory Average Pain 8 Pain Right Now 8 My pain is intermittent, sharp, stabbing and aching  In the last 24 hours, has pain interfered with the following? General activity 7 Relation with others 6 Enjoyment of life 7 What TIME of day is your pain at its worst? morning, daytime, evening, night Sleep (in general) Fair  Pain is worse with: walking, bending, standing and some activites Pain improves with: rest and medication Relief from Meds: 4  Mobility walk without assistance how many minutes can you walk? 6 ability to climb steps?  no do you drive?  yes Do you have any goals in this area?  yes  Function disabled: date disabled 12/19/2015 I need assistance with the following:  meal prep, household duties and shopping  Neuro/Psych bladder control problems numbness depression anxiety suicidal thoughts  Prior Studies Any changes since last visit?  no  Physicians involved in your care Any changes since last visit?  no   Family History  Problem Relation Age of Onset  . Hypertension Maternal Grandmother   . Stroke Maternal Grandmother   . Diabetes Maternal Grandfather   . Cancer Mother        lung  . Cancer Father   . Diabetes Sister   . Breast cancer Cousin   . Heart attack Neg Hx    Social History   Socioeconomic History  . Marital status: Single    Spouse name: None  . Number of children: None  . Years of education: None  . Highest education level: None  Social Needs  . Financial resource strain: None  . Food insecurity  - worry: None  . Food insecurity - inability: None  . Transportation needs - medical: None  . Transportation needs - non-medical: None  Occupational History  . None  Tobacco Use  . Smoking status: Never Smoker  . Smokeless tobacco: Never Used  Substance and Sexual Activity  . Alcohol use: No    Alcohol/week: 0.0 oz  . Drug use: No  . Sexual activity: No  Other Topics Concern  . None  Social History Narrative  . None   Past Surgical History:  Procedure Laterality Date  . ABDOMINAL SURGERY     hernia repair  . CHOLECYSTECTOMY  [id number]  . COLONOSCOPY WITH PROPOFOL N/A 07/31/2016   Procedure: COLONOSCOPY WITH PROPOFOL;  Surgeon: Arta Silence, MD;  Location: [facility];  Service: Endoscopy;  Laterality: N/A;  . FOOT SURGERY Right 06/2007  . KNEE ARTHROSCOPY  09/2008   left  . KNEE ARTHROSCOPY  april 2011   left  . NASAL SINUS SURGERY    . NASAL SINUS SURGERY  1990  . SHOULDER SURGERY  2004   left   Past Medical History:  Diagnosis Date  . Anxiety   . Asthma   . 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 Depression   . Diabetes mellitus   . Hypertension   . Migraines   . Near syncope   . Obesity   .  PCOS (polycystic ovarian syndrome)   . Peripheral edema   . Sleep apnea    uses cpap set on 10  . 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    BP (!) 143/74 (BP Location: Left Arm, Patient Position: Sitting, Cuff Size: Large)   Pulse 78   SpO2 97%   Opioid Risk Score:   Fall Risk Score:  `1  Depression screen PHQ 2/9  Depression screen [name] 2/9 12/25/2016 07/01/2016  Decreased Interest 3 2  Down, Depressed, Hopeless 3 3  PHQ - 2 Score 6 5  Altered sleeping - 3  Tired, decreased energy - 3  Change in appetite - 1  Feeling bad or failure about yourself  - 3  Trouble concentrating - 1  Moving slowly or fidgety/restless - 2  Suicidal thoughts - 1  PHQ-9 Score - 19    Review of Systems  Constitutional: Positive for diaphoresis, fever and unexpected  weight change.  HENT: Negative.   Eyes: Negative.   Respiratory: Positive for cough.   Cardiovascular: Negative.   Gastrointestinal: Positive for nausea.  Endocrine: Negative.   Genitourinary: Negative.   Musculoskeletal: Positive for back pain and joint swelling.  Skin: Negative.   Allergic/Immunologic: Negative.   Neurological: Negative.   Hematological: Negative.   Psychiatric/Behavioral: Positive for dysphoric mood. The patient is nervous/anxious.   All other systems reviewed and are negative.      Objective:   Physical Exam  Constitutional: She is oriented to person, place, and time. She appears well-developed and well-nourished.  HENT:  Head: Normocephalic and atraumatic.  Neck: Normal range of motion. Neck supple.  Cardiovascular: Normal rate and regular rhythm.  Pulmonary/Chest: Effort normal and breath sounds normal.  Musculoskeletal: She exhibits edema.  Normal Muscle Bulk and Muscle Testing Reveals: Upper Extremities: Full ROM and Muscle Strength 5/5 Lumbar Paraspinal Tenderness: L-3-L-5 Lower Extremities: Full ROM and Muscle Strength 5/5 Wearing compression stockings Arises from Table slowly Narrow Based Gait  Neurological: She is alert and oriented to person, place, and time.  Skin: Skin is warm and dry.  Psychiatric: She has a normal mood and affect.  Nursing note and vitals reviewed.         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  Second script given for the following month. We will continue the opioid monitoring program, this consists of regular clinic visits, examinations, urine drug screen, pill counts as well as use of [place] Controlled Substance Reporting System.  2. Morbid obesity: Continue Healthy Diet Regime and HEP. 09/26/2017 3. Type II diabetes:  Dr. Buddy Duty following. 09/26/2017 4. Reactive Depression: Continue Zoloft.  09/26/2017 5. Myofascial Muscle Pain: Continue Soma. 09/26/2017  20 minutes of face to face patient care time was spent during this visit. All questions were encouraged and answered.  F/U in 1 month

## 2017-09-26 NOTE — Telephone Encounter (Signed)
On 09/26/2017 the  [place] was reviewed no conflict was seen on the [place] with multiple prescribers. Kristen [name] has a signed narcotic contract with our office. If there were any discrepancies this would have been reported to her physician.

## 2017-09-29 ENCOUNTER — Encounter: Payer: Medicare HMO | Admitting: Physical Medicine & Rehabilitation

## 2017-10-06 ENCOUNTER — Encounter (HOSPITAL_COMMUNITY): Payer: Self-pay

## 2017-10-06 DIAGNOSIS — R69 Illness, unspecified: Secondary | ICD-10-CM | POA: Diagnosis not present

## 2017-10-18 DIAGNOSIS — G4733 Obstructive sleep apnea (adult) (pediatric): Secondary | ICD-10-CM | POA: Diagnosis not present

## 2017-10-20 DIAGNOSIS — I10 Essential (primary) hypertension: Secondary | ICD-10-CM | POA: Diagnosis not present

## 2017-10-20 DIAGNOSIS — E1122 Type 2 diabetes mellitus with diabetic chronic kidney disease: Secondary | ICD-10-CM | POA: Diagnosis not present

## 2017-10-22 DIAGNOSIS — E119 Type 2 diabetes mellitus without complications: Secondary | ICD-10-CM | POA: Diagnosis not present

## 2017-11-17 DIAGNOSIS — R05 Cough: Secondary | ICD-10-CM | POA: Diagnosis not present

## 2017-11-17 DIAGNOSIS — J45991 Cough variant asthma: Secondary | ICD-10-CM | POA: Diagnosis not present

## 2017-11-17 DIAGNOSIS — Z86711 Personal history of pulmonary embolism: Secondary | ICD-10-CM | POA: Diagnosis not present

## 2017-11-27 ENCOUNTER — Ambulatory Visit: Payer: Medicare HMO | Admitting: Registered Nurse

## 2017-12-05 ENCOUNTER — Other Ambulatory Visit: Payer: Self-pay

## 2017-12-05 ENCOUNTER — Encounter: Payer: Self-pay | Admitting: Registered Nurse

## 2017-12-05 ENCOUNTER — Encounter: Payer: PPO | Attending: Physical Medicine & Rehabilitation | Admitting: Registered Nurse

## 2017-12-05 VITALS — BP 149/84 | HR 82

## 2017-12-05 DIAGNOSIS — M7918 Myalgia, other site: Secondary | ICD-10-CM | POA: Diagnosis not present

## 2017-12-05 DIAGNOSIS — E669 Obesity, unspecified: Secondary | ICD-10-CM | POA: Insufficient documentation

## 2017-12-05 DIAGNOSIS — E119 Type 2 diabetes mellitus without complications: Secondary | ICD-10-CM | POA: Diagnosis not present

## 2017-12-05 DIAGNOSIS — M47816 Spondylosis without myelopathy or radiculopathy, lumbar region: Secondary | ICD-10-CM

## 2017-12-05 DIAGNOSIS — Z794 Long term (current) use of insulin: Secondary | ICD-10-CM | POA: Insufficient documentation

## 2017-12-05 DIAGNOSIS — Z79899 Other long term (current) drug therapy: Secondary | ICD-10-CM

## 2017-12-05 DIAGNOSIS — Z5181 Encounter for therapeutic drug level monitoring: Secondary | ICD-10-CM | POA: Diagnosis not present

## 2017-12-05 DIAGNOSIS — M545 Low back pain: Secondary | ICD-10-CM | POA: Insufficient documentation

## 2017-12-05 DIAGNOSIS — G894 Chronic pain syndrome: Secondary | ICD-10-CM

## 2017-12-05 DIAGNOSIS — I10 Essential (primary) hypertension: Secondary | ICD-10-CM | POA: Diagnosis not present

## 2017-12-05 DIAGNOSIS — Z7982 Long term (current) use of aspirin: Secondary | ICD-10-CM | POA: Insufficient documentation

## 2017-12-05 DIAGNOSIS — G8929 Other chronic pain: Secondary | ICD-10-CM | POA: Diagnosis not present

## 2017-12-05 MED ORDER — OXYCODONE HCL 10 MG PO TABS: 10.0000 mg | ORAL_TABLET | Freq: Three times a day (TID) | ORAL | 0 refills | Status: DC | PRN

## 2017-12-05 NOTE — Progress Notes (Signed)
Subjective:    Patient ID: Kristen [name], female    DOB: 07-06-1966, 52 y.o.   MRN: [id number]  HPI: Ms. [name] is a  52 year old female who returns for follow up appointment and medication refill. She states her pain is located in her bilateral shoulders and lower back. She rates her pain 9. Her current exercise regime is walking and performing household chores.  Ms. [name] equivalent is 43.50MME.     Oral Swab was performed on 03/26/2017 it was consistent. UDS ordered today.  Pain Inventory Average Pain 7 Pain Right Now 9 My pain is constant and sharp  In the last 24 hours, has pain interfered with the following? General activity 9 Relation with others 9 Enjoyment of life 9 What TIME of day is your pain at its worst? all Sleep (in general) NA  Pain is worse with: walking, bending, standing and some activites Pain improves with: rest, heat/ice and medication Relief from Meds: 5  Mobility walk without assistance how many minutes can you walk? 10 ability to climb steps?  no do you drive?  yes Do you have any goals in this area?  yes  Function disabled: date disabled 12/19/2015 I need assistance with the following:  meal prep, household duties and shopping  Neuro/Psych bladder control problems numbness depression anxiety  Prior Studies Any changes since last visit?  no  Physicians involved in your care Any changes since last visit?  no   Family History  Problem Relation Age of Onset  . Hypertension Maternal Grandmother   . Stroke Maternal Grandmother   . Diabetes Maternal Grandfather   . Cancer Mother        lung  . Cancer Father   . Diabetes Sister   . Breast cancer Cousin   . Heart attack Neg Hx    Social History   Socioeconomic History  . Marital status: Single    Spouse name: None  . Number of children: None  . Years of education: None  . Highest education level: None  Social Needs  . Financial resource strain: None    . Food insecurity - worry: None  . Food insecurity - inability: None  . Transportation needs - medical: None  . Transportation needs - non-medical: None  Occupational History  . None  Tobacco Use  . Smoking status: Never Smoker  . Smokeless tobacco: Never Used  Substance and Sexual Activity  . Alcohol use: No    Alcohol/week: 0.0 oz  . Drug use: No  . Sexual activity: No  Other Topics Concern  . None  Social History Narrative  . None   Past Surgical History:  Procedure Laterality Date  . ABDOMINAL SURGERY     hernia repair  . CHOLECYSTECTOMY  [id number]  . COLONOSCOPY WITH PROPOFOL N/A 07/31/2016   Procedure: COLONOSCOPY WITH PROPOFOL;  Surgeon: Arta Silence, MD;  Location: [facility];  Service: Endoscopy;  Laterality: N/A;  . FOOT SURGERY Right 06/2007  . KNEE ARTHROSCOPY  09/2008   left  . KNEE ARTHROSCOPY  april 2011   left  . NASAL SINUS SURGERY    . NASAL SINUS SURGERY  1990  . SHOULDER SURGERY  2004   left   Past Medical History:  Diagnosis Date  . Anxiety   . Asthma   . 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 Depression   . Diabetes mellitus   . Hypertension   . Migraines   . Near syncope   .  Obesity   . PCOS (polycystic ovarian syndrome)   . Peripheral edema   . Sleep apnea    uses cpap set on 10  . 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    BP (!) 149/84 (BP Location: Left Arm, Patient Position: Sitting)   Pulse 82   SpO2 96%   Opioid Risk Score:  1 Fall Risk Score:  `1  Depression screen PHQ 2/9  Depression screen [name] 2/9 12/05/2017 12/25/2016 07/01/2016  Decreased Interest 3 3 2   Down, Depressed, Hopeless 3 3 3   PHQ - 2 Score 6 6 5   Altered sleeping - - 3  Tired, decreased energy - - 3  Change in appetite - - 1  Feeling bad or failure about yourself  - - 3  Trouble concentrating - - 1  Moving slowly or fidgety/restless - - 2  Suicidal thoughts - - 1  PHQ-9 Score - - 19    Review of Systems  Constitutional: Negative.    HENT: Negative.   Eyes: Negative.   Respiratory: Positive for cough, shortness of breath and wheezing.   Cardiovascular: Negative.   Endocrine: Negative.   Genitourinary: Negative.   Musculoskeletal: Positive for back pain and gait problem.       Spasms  Skin: Negative.   Allergic/Immunologic: Negative.   Hematological: Negative.   Psychiatric/Behavioral: Positive for dysphoric mood. The patient is nervous/anxious.   All other systems reviewed and are negative.      Objective:   Physical Exam  Constitutional: She is oriented to person, place, and time. She appears well-developed and well-nourished.  HENT:  Head: Normocephalic and atraumatic.  Neck: Normal range of motion. Neck supple.  Cardiovascular: Normal rate and regular rhythm.  Pulmonary/Chest: Effort normal and breath sounds normal.  Musculoskeletal: She exhibits no edema.  Normal Muscle Bulk and Muscle Testing Reveals: Upper Extremities: Full  ROM and Muscle Strength 5/5 Bilateral AC Joint Tenderness Lumbar Paraspinal Tenderness:L-4-L-5 Lower Extremities: Full ROM and Muscle Strength 5/5 Arises from Table slowly Narrow Based Gait  Neurological: She is alert and oriented to person, place, and time.  Skin: Skin is warm and dry.  Psychiatric: She has a normal mood and affect.  Nursing note and vitals reviewed.         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  Second script given for the following month. We will continue the opioid monitoring program, this consists of regular clinic visits, examinations, urine drug screen, pill counts as well as use of [place] Controlled Substance Reporting System.  2. Morbid obesity: Continue Healthy Diet Regime and HEP. 12/05/2017 3. Type II diabetes:  Dr. Buddy Duty following. 12/05/2017 4. Reactive Depression: Continue Zoloft. 12/05/2017 5. Myofascial Muscle  Pain: Continue Soma. 12/05/2017  20 minutes of face to face patient care time was spent during this visit. All questions were encouraged and answered.  F/U in 1 month

## 2017-12-08 DIAGNOSIS — M25511 Pain in right shoulder: Secondary | ICD-10-CM | POA: Diagnosis not present

## 2017-12-08 DIAGNOSIS — M25512 Pain in left shoulder: Secondary | ICD-10-CM | POA: Diagnosis not present

## 2017-12-12 LAB — TOXASSURE SELECT,+ANTIDEPR,UR

## 2017-12-15 ENCOUNTER — Telehealth: Payer: Self-pay | Admitting: *Deleted

## 2017-12-15 NOTE — Telephone Encounter (Signed)
Urine drug screen for this encounter is consistent for prescribed medication 

## 2017-12-23 ENCOUNTER — Encounter: Payer: Self-pay | Admitting: Women's Health

## 2017-12-26 ENCOUNTER — Ambulatory Visit: Payer: Self-pay | Admitting: Registered Nurse

## 2018-01-06 DIAGNOSIS — E1165 Type 2 diabetes mellitus with hyperglycemia: Secondary | ICD-10-CM | POA: Diagnosis not present

## 2018-01-06 DIAGNOSIS — N181 Chronic kidney disease, stage 1: Secondary | ICD-10-CM | POA: Diagnosis not present

## 2018-01-06 DIAGNOSIS — Z794 Long term (current) use of insulin: Secondary | ICD-10-CM | POA: Diagnosis not present

## 2018-01-06 DIAGNOSIS — R609 Edema, unspecified: Secondary | ICD-10-CM | POA: Diagnosis not present

## 2018-01-06 DIAGNOSIS — R110 Nausea: Secondary | ICD-10-CM | POA: Diagnosis not present

## 2018-01-06 DIAGNOSIS — Z6841 Body Mass Index (BMI) 40.0 and over, adult: Secondary | ICD-10-CM | POA: Diagnosis not present

## 2018-01-06 DIAGNOSIS — E1122 Type 2 diabetes mellitus with diabetic chronic kidney disease: Secondary | ICD-10-CM | POA: Diagnosis not present

## 2018-01-08 DIAGNOSIS — R350 Frequency of micturition: Secondary | ICD-10-CM | POA: Diagnosis not present

## 2018-01-08 DIAGNOSIS — J329 Chronic sinusitis, unspecified: Secondary | ICD-10-CM | POA: Diagnosis not present

## 2018-01-23 ENCOUNTER — Encounter: Payer: PPO | Admitting: Registered Nurse

## 2018-01-26 ENCOUNTER — Other Ambulatory Visit: Payer: Self-pay

## 2018-01-26 ENCOUNTER — Encounter: Payer: Self-pay | Admitting: Registered Nurse

## 2018-01-26 ENCOUNTER — Encounter: Payer: PPO | Attending: Physical Medicine & Rehabilitation | Admitting: Registered Nurse

## 2018-01-26 VITALS — BP 102/68 | HR 66 | Ht 68.0 in | Wt 389.0 lb

## 2018-01-26 DIAGNOSIS — M545 Low back pain: Secondary | ICD-10-CM | POA: Insufficient documentation

## 2018-01-26 DIAGNOSIS — M7581 Other shoulder lesions, right shoulder: Secondary | ICD-10-CM | POA: Diagnosis not present

## 2018-01-26 DIAGNOSIS — E1122 Type 2 diabetes mellitus with diabetic chronic kidney disease: Secondary | ICD-10-CM | POA: Diagnosis not present

## 2018-01-26 DIAGNOSIS — Z79899 Other long term (current) drug therapy: Secondary | ICD-10-CM | POA: Diagnosis not present

## 2018-01-26 DIAGNOSIS — M7918 Myalgia, other site: Secondary | ICD-10-CM

## 2018-01-26 DIAGNOSIS — Z5181 Encounter for therapeutic drug level monitoring: Secondary | ICD-10-CM

## 2018-01-26 DIAGNOSIS — E669 Obesity, unspecified: Secondary | ICD-10-CM | POA: Diagnosis not present

## 2018-01-26 DIAGNOSIS — M47816 Spondylosis without myelopathy or radiculopathy, lumbar region: Secondary | ICD-10-CM | POA: Diagnosis not present

## 2018-01-26 DIAGNOSIS — G894 Chronic pain syndrome: Secondary | ICD-10-CM | POA: Diagnosis not present

## 2018-01-26 DIAGNOSIS — I10 Essential (primary) hypertension: Secondary | ICD-10-CM | POA: Insufficient documentation

## 2018-01-26 DIAGNOSIS — G8929 Other chronic pain: Secondary | ICD-10-CM | POA: Diagnosis not present

## 2018-01-26 DIAGNOSIS — M778 Other enthesopathies, not elsewhere classified: Secondary | ICD-10-CM

## 2018-01-26 DIAGNOSIS — Z794 Long term (current) use of insulin: Secondary | ICD-10-CM | POA: Insufficient documentation

## 2018-01-26 DIAGNOSIS — G4733 Obstructive sleep apnea (adult) (pediatric): Secondary | ICD-10-CM | POA: Diagnosis not present

## 2018-01-26 DIAGNOSIS — E119 Type 2 diabetes mellitus without complications: Secondary | ICD-10-CM | POA: Diagnosis not present

## 2018-01-26 DIAGNOSIS — Z7982 Long term (current) use of aspirin: Secondary | ICD-10-CM | POA: Diagnosis not present

## 2018-01-26 MED ORDER — OXYCODONE HCL 10 MG PO TABS: 10.0000 mg | ORAL_TABLET | Freq: Three times a day (TID) | ORAL | 0 refills | Status: DC | PRN

## 2018-01-26 NOTE — Progress Notes (Signed)
Subjective:    Patient ID: Kristen [name], female    DOB: 1965/11/15, 52 y.o.   MRN: [id number]  HPI: Kristen [name] is a 52 year old female who returns for follow up appointment for chronic pain and medication refill. She states her pain is located in her right shoulder and lower back. She rates her pain 8. Her current exercise regime is walking.  Kristen [name] reports she was walking in her yard on Saturday 01/24/2018, when she began to stumble she was able to brace herself from falling on the ground and her right shoulder bumped into the trash can.   Kristen [name] Morphine Equivalent is 43.50 MME.   Pain Inventory Average Pain 8 Pain Right Now 8 My pain is constant, sharp and stabbing  In the last 24 hours, has pain interfered with the following? General activity 10 Relation with others 10 Enjoyment of life 10 What TIME of day is your pain at its worst? morning daytime evening night Sleep (in general) Fair  Pain is worse with: walking, bending, standing and some activites Pain improves with: rest, heat/ice and medication Relief from Meds: 5  Mobility ability to climb steps?  no do you drive?  yes Do you have any goals in this area?  no  Function disabled: date disabled 6/13 I need assistance with the following:  meal prep and household duties Do you have any goals in this area?  yes  Neuro/Psych bladder control problems depression anxiety  Prior Studies Any changes since last visit?  no  Physicians involved in your care Any changes since last visit?  no   Family History  Problem Relation Age of Onset  . Hypertension Maternal Grandmother   . Stroke Maternal Grandmother   . Diabetes Maternal Grandfather   . Cancer Mother        lung  . Cancer Father   . Diabetes Sister   . Breast cancer Cousin   . Heart attack Neg Hx    Social History   Socioeconomic History  . Marital status: Single    Spouse name: Not on file  . Number of children: Not on  file  . Years of education: Not on file  . Highest education level: Not on file  Occupational History  . Not on file  Social Needs  . Financial resource strain: Not on file  . Food insecurity:    Worry: Not on file    Inability: Not on file  . Transportation needs:    Medical: Not on file    Non-medical: Not on file  Tobacco Use  . Smoking status: Never Smoker  . Smokeless tobacco: Never Used  Substance and Sexual Activity  . Alcohol use: No    Alcohol/week: 0.0 oz  . Drug use: No  . Sexual activity: Never  Lifestyle  . Physical activity:    Days per week: Not on file    Minutes per session: Not on file  . Stress: Not on file  Relationships  . Social connections:    Talks on phone: Not on file    Gets together: Not on file    Attends religious service: Not on file    Active member of club or organization: Not on file    Attends meetings of clubs or organizations: Not on file    Relationship status: Not on file  Other Topics Concern  . Not on file  Social History Narrative  . Not on file   Past Surgical  History:  Procedure Laterality Date  . ABDOMINAL SURGERY     hernia repair  . CHOLECYSTECTOMY  [id number]  . COLONOSCOPY WITH PROPOFOL N/A 07/31/2016   Procedure: COLONOSCOPY WITH PROPOFOL;  Surgeon: Arta Silence, MD;  Location: [facility];  Service: Endoscopy;  Laterality: N/A;  . FOOT SURGERY Right 06/2007  . KNEE ARTHROSCOPY  09/2008   left  . KNEE ARTHROSCOPY  april 2011   left  . NASAL SINUS SURGERY    . NASAL SINUS SURGERY  1990  . SHOULDER SURGERY  2004   left   Past Medical History:  Diagnosis Date  . Anxiety   . Asthma   . 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 Depression   . Diabetes mellitus   . Hypertension   . Migraines   . Near syncope   . Obesity   . PCOS (polycystic ovarian syndrome)   . Peripheral edema   . Sleep apnea    uses cpap set on 10  . 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 2/9 01/26/2018 12/05/2017 12/25/2016 07/01/2016  Decreased Interest 3 3 3 2   Down, Depressed, Hopeless 3 3 3 3   PHQ - 2 Score 6 6 6 5   Altered sleeping 0 - - 3  Tired, decreased energy 2 - - 3  Change in appetite 0 - - 1  Feeling bad or failure about yourself  3 - - 3  Trouble concentrating 0 - - 1  Moving slowly or fidgety/restless 0 - - 2  Suicidal thoughts 0 - - 1  PHQ-9 Score 11 - - 19  Difficult doing work/chores Very difficult - - -   Review of Systems  Constitutional: Negative.   HENT: Negative.   Eyes: Negative.   Respiratory: Positive for shortness of breath.   Cardiovascular: Negative.   Gastrointestinal: Negative.   Endocrine: Negative.   Genitourinary: Negative.   Musculoskeletal: Negative.   Skin: Negative.   Allergic/Immunologic: Negative.   Neurological: Negative.   Hematological: Negative.   Psychiatric/Behavioral: Negative.   All other systems reviewed and are negative.      Objective:   Physical Exam  Constitutional: She is oriented to person, place, and time. She appears well-developed and well-nourished.  HENT:  Head: Normocephalic and atraumatic.  Neck: Normal range of motion. Neck supple.  Cardiovascular: Normal rate and regular rhythm.  Pulmonary/Chest: Effort normal and breath sounds normal.  Musculoskeletal:  Normal Muscle Bulk and Muscle Testing Reveals: Upper Extremities: Full  ROM and Muscle Strength 5/5 Right AC Joint Tenderness Thoracic Paraspinal Tenderness: T-1-T-3 Lumbar Paraspinal Tenderness: L-3-L-5 Lower Extremities: Full ROM and Muscle Strength 5/5 Wearing Compression Stockings Arises from Table with ease Narrow Based Gait  Neurological: She is alert and oriented to person, place, and time.  Skin: Skin is warm and  dry.  Psychiatric: She has a normal mood and affect.  Nursing note and vitals reviewed.         Assessment & Plan:  1.Spondylosisof Lumbar Region/ Lumbar Facet arthropathy: 01/26/2018 Refilled: Oxycodone 10 mg one tablet every 8 hours as needed #90.  Second script given for the following  month. We will continue the opioid monitoring program, this consists of regular clinic visits, examinations, urine drug screen, pill counts as well as use of [place] Controlled Substance Reporting System.  2. Morbid obesity: Continue Healthy Diet Regime and HEP. 01/26/2018 3. Type II diabetes: Dr. Buddy Duty following. 01/26/2018 4. Reactive Depression: Continue Zoloft. 01/26/2018 5. Myofascial Muscle Pain: Continue current medication regimen with Soma. 01/26/2018  20 minutes of face to face patient care time was spent during this visit. All questions were encouraged and answered.  F/U in 1 month

## 2018-02-20 ENCOUNTER — Encounter: Payer: Self-pay | Admitting: *Deleted

## 2018-02-20 ENCOUNTER — Other Ambulatory Visit: Payer: Self-pay | Admitting: *Deleted

## 2018-02-20 NOTE — Patient Outreach (Signed)
[name]) Care Management  02/20/2018  Kristen [name] 01/08/1966 [id number]  Referral from Health Team Concierge: Member is in coverage gap and cannot afford her medications.  Telephone call to patient who was advised of reason for call and of [facility] care managements services. HIPPA verification received from patient.   Patient voices that major health concern now is that she is in donut hole with her medications. States she was reported to be in donut hole at end of April 2019. States she has talked with her MD who was willing to change and  adjust some of her medications but she prefers not to do that.  States she prefers to stay on Eliquis, Novalog, Advair and Lantus. States he has 2 month supply of Eliquis, 1-2 month supply of Novalog, 4 month supply of Advair, and 4 months supply of Lantus. States she gets prescriptions filled at local pharmacy for 90 day supply. Voices that she has researched issue and has talked with Public Health department regarding her concern.  States she has also had counsel from [place] and she does not qualify because income is above guidelines to qualify for services. States she gets monthly disability check.   Patient voices that she drives and attends MD appointments as scheduled. States she  manages medications and currently is taking as prescribed. Voices she has hx of diabetes over 20 years.  Recommendations for referral to [facility] Pharmacist and [place].  Patient declines [facility] services. States she does not need case management at this time. States she has researched donut hole concern and understands what is available & what is not available based on her eligibility. States she has talked with her medical professionals. Patient thanked this Electronics engineer for call.  Plan: Send [facility] contact information to patient. Send MD closure letter.  Sherrin Daisy, RN BSN [place] Management Coordinator [name] Care Management  [phone]

## 2018-02-23 DIAGNOSIS — R05 Cough: Secondary | ICD-10-CM | POA: Diagnosis not present

## 2018-02-25 DIAGNOSIS — N3941 Urge incontinence: Secondary | ICD-10-CM | POA: Diagnosis not present

## 2018-03-09 ENCOUNTER — Telehealth: Payer: Self-pay | Admitting: Hematology and Oncology

## 2018-03-09 NOTE — Telephone Encounter (Signed)
Call day reschedule.  Spoke w/ pt to reschedule from 6/25 to 7/1.

## 2018-03-11 ENCOUNTER — Encounter: Payer: Self-pay | Admitting: Women's Health

## 2018-03-11 ENCOUNTER — Ambulatory Visit (INDEPENDENT_AMBULATORY_CARE_PROVIDER_SITE_OTHER): Payer: PPO | Admitting: Women's Health

## 2018-03-11 VITALS — BP 124/76 | Ht 67.0 in | Wt 390.0 lb

## 2018-03-11 DIAGNOSIS — B373 Candidiasis of vulva and vagina: Secondary | ICD-10-CM

## 2018-03-11 DIAGNOSIS — Z01419 Encounter for gynecological examination (general) (routine) without abnormal findings: Secondary | ICD-10-CM

## 2018-03-11 DIAGNOSIS — Z124 Encounter for screening for malignant neoplasm of cervix: Secondary | ICD-10-CM

## 2018-03-11 DIAGNOSIS — B3731 Acute candidiasis of vulva and vagina: Secondary | ICD-10-CM

## 2018-03-11 MED ORDER — NYSTATIN 100000 UNIT/GM EX CREA: 1.0000 "application " | TOPICAL_CREAM | Freq: Two times a day (BID) | CUTANEOUS | 6 refills | Status: DC
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
## 2018-03-11 NOTE — Progress Notes (Signed)
[id number]  

## 2018-03-11 NOTE — Patient Instructions (Signed)
Carbohydrate Counting for Diabetes Mellitus, Adult Carbohydrate counting is a method for keeping track of how many carbohydrates you eat. Eating carbohydrates naturally increases the amount of sugar (glucose) in the blood. Counting how many carbohydrates you eat helps keep your blood glucose within normal limits, which helps you manage your diabetes (diabetes mellitus). It is important to know how many carbohydrates you can safely have in each meal. This is different for every person. A diet and nutrition specialist (registered dietitian) can help you make a meal plan and calculate how many carbohydrates you should have at each meal and snack. Carbohydrates are found in the following foods:  Grains, such as breads and cereals.  Dried beans and soy products.  Starchy vegetables, such as potatoes, peas, and corn.  Fruit and fruit juices.  Milk and yogurt.  Sweets and snack foods, such as cake, cookies, candy, chips, and soft drinks.  How do I count carbohydrates? There are two ways to count carbohydrates in food. You can use either of the methods or a combination of both. Reading "Nutrition Facts" on packaged food The "Nutrition Facts" list is included on the labels of almost all packaged foods and beverages in the [place] It includes:  The serving size.  Information about nutrients in each serving, including the grams (g) of carbohydrate per serving.  To use the "Nutrition Facts":  Decide how many servings you will have.  Multiply the number of servings by the number of carbohydrates per serving.  The resulting number is the total amount of carbohydrates that you will be having.  Learning standard serving sizes of other foods When you eat foods containing carbohydrates that are not packaged or do not include "Nutrition Facts" on the label, you need to measure the servings in order to count the amount of carbohydrates:  Measure the foods that you will eat with a food scale or  measuring cup, if needed.  Decide how many standard-size servings you will eat.  Multiply the number of servings by 15. Most carbohydrate-rich foods have about 15 g of carbohydrates per serving. ? For example, if you eat 8 oz (170 g) of strawberries, you will have eaten 2 servings and 30 g of carbohydrates (2 servings x 15 g = 30 g).  For foods that have more than one food mixed, such as soups and casseroles, you must count the carbohydrates in each food that is included.  The following list contains standard serving sizes of common carbohydrate-rich foods. Each of these servings has about 15 g of carbohydrates:   hamburger bun or  English muffin.   oz (15 mL) syrup.   oz (14 g) jelly.  1 slice of bread.  1 six-inch tortilla.  3 oz (85 g) cooked rice or pasta.  4 oz (113 g) cooked dried beans.  4 oz (113 g) starchy vegetable, such as peas, corn, or potatoes.  4 oz (113 g) hot cereal.  4 oz (113 g) mashed potatoes or  of a large baked potato.  4 oz (113 g) canned or frozen fruit.  4 oz (120 mL) fruit juice.  4-6 crackers.  6 chicken nuggets.  6 oz (170 g) unsweetened dry cereal.  6 oz (170 g) plain fat-free yogurt or yogurt sweetened with artificial sweeteners.  8 oz (240 mL) milk.  8 oz (170 g) fresh fruit or one small piece of fruit.  24 oz (680 g) popped popcorn.  Example of carbohydrate counting Sample meal  3 oz (85 g) chicken breast.    6 oz (170 g) brown rice.  4 oz (113 g) corn.  8 oz (240 mL) milk.  8 oz (170 g) strawberries with sugar-free whipped topping. Carbohydrate calculation 1. Identify the foods that contain carbohydrates: ? Rice. ? Corn. ? Milk. ? Strawberries. 2. Calculate how many servings you have of each food: ? 2 servings rice. ? 1 serving corn. ? 1 serving milk. ? 1 serving strawberries. 3. Multiply each number of servings by 15 g: ? 2 servings rice x 15 g = 30 g. ? 1 serving corn x 15 g = 15 g. ? 1 serving milk x 15  g = 15 g. ? 1 serving strawberries x 15 g = 15 g. 4. Add together all of the amounts to find the total grams of carbohydrates eaten: ? 30 g + 15 g + 15 g + 15 g = 75 g of carbohydrates total. This information is not intended to replace advice given to you by your health care provider. Make sure you discuss any questions you have with your health care provider. Document Released: 10/07/2005 Document Revised: 04/26/2016 Document Reviewed: 03/20/2016 Elsevier Interactive Patient Education  2018 [place] Maintenance, Female Adopting a healthy lifestyle and getting preventive care can go a long way to promote health and wellness. Talk with your health care provider about what schedule of regular examinations is right for you. This is a good chance for you to check in with your provider about disease prevention and staying healthy. In between checkups, there are plenty of things you can do on your own. Experts have done a lot of research about which lifestyle changes and preventive measures are most likely to keep you healthy. Ask your health care provider for more information. Weight and diet Eat a healthy diet  Be sure to include plenty of vegetables, fruits, low-fat dairy products, and lean protein.  Do not eat a lot of foods high in solid fats, added sugars, or salt.  Get regular exercise. This is one of the most important things you can do for your health. ? Most adults should exercise for at least 150 minutes each week. The exercise should increase your heart rate and make you sweat (moderate-intensity exercise). ? Most adults should also do strengthening exercises at least twice a week. This is in addition to the moderate-intensity exercise.  Maintain a healthy weight  Body mass index (BMI) is a measurement that can be used to identify possible weight problems. It estimates body fat based on height and weight. Your health care provider can help determine your BMI and help you  achieve or maintain a healthy weight.  For females 76 years of age and older: ? A BMI below 18.5 is considered underweight. ? A BMI of 18.5 to 24.9 is normal. ? A BMI of 25 to 29.9 is considered overweight. ? A BMI of 30 and above is considered obese.  Watch levels of cholesterol and blood lipids  You should start having your blood tested for lipids and cholesterol at 52 years of age, then have this test every 5 years.  You may need to have your cholesterol levels checked more often if: ? Your lipid or cholesterol levels are high. ? You are older than 52 years of age. ? You are at high risk for heart disease.  Cancer screening Lung Cancer  Lung cancer screening is recommended for adults 52-25 years old who are at high risk for lung cancer because of a history of smoking.  A  yearly low-dose CT scan of the lungs is recommended for people who: ? Currently smoke. ? Have quit within the past 15 years. ? Have at least a 30-pack-year history of smoking. A pack year is smoking an average of one pack of cigarettes a day for 1 year.  Yearly screening should continue until it has been 15 years since you quit.  Yearly screening should stop if you develop a health problem that would prevent you from having lung cancer treatment.  Breast Cancer  Practice breast self-awareness. This means understanding how your breasts normally appear and feel.  It also means doing regular breast self-exams. Let your health care provider know about any changes, no matter how small.  If you are in your 20s or 30s, you should have a clinical breast exam (CBE) by a health care provider every 1-3 years as part of a regular health exam.  If you are 4 or older, have a CBE every year. Also consider having a breast X-ray (mammogram) every year.  If you have a family history of breast cancer, talk to your health care provider about genetic screening.  If you are at high risk for breast cancer, talk to your health  care provider about having an MRI and a mammogram every year.  Breast cancer gene (BRCA) assessment is recommended for women who have family members with BRCA-related cancers. BRCA-related cancers include: ? Breast. ? Ovarian. ? Tubal. ? Peritoneal cancers.  Results of the assessment will determine the need for genetic counseling and BRCA1 and BRCA2 testing.  Cervical Cancer Your health care provider may recommend that you be screened regularly for cancer of the pelvic organs (ovaries, uterus, and vagina). This screening involves a pelvic examination, including checking for microscopic changes to the surface of your cervix (Pap test). You may be encouraged to have this screening done every 3 years, beginning at age 12.  For women ages 13-65, health care providers may recommend pelvic exams and Pap testing every 3 years, or they may recommend the Pap and pelvic exam, combined with testing for human papilloma virus (HPV), every 5 years. Some types of HPV increase your risk of cervical cancer. Testing for HPV may also be done on women of any age with unclear Pap test results.  Other health care providers may not recommend any screening for nonpregnant women who are considered low risk for pelvic cancer and who do not have symptoms. Ask your health care provider if a screening pelvic exam is right for you.  If you have had past treatment for cervical cancer or a condition that could lead to cancer, you need Pap tests and screening for cancer for at least 20 years after your treatment. If Pap tests have been discontinued, your risk factors (such as having a new sexual partner) need to be reassessed to determine if screening should resume. Some women have medical problems that increase the chance of getting cervical cancer. In these cases, your health care provider may recommend more frequent screening and Pap tests.  Colorectal Cancer  This type of cancer can be detected and often  prevented.  Routine colorectal cancer screening usually begins at 52 years of age and continues through 52 years of age.  Your health care provider may recommend screening at an earlier age if you have risk factors for colon cancer.  Your health care provider may also recommend using home test kits to check for hidden blood in the stool.  A small camera at the end of  a tube can be used to examine your colon directly (sigmoidoscopy or colonoscopy). This is done to check for the earliest forms of colorectal cancer.  Routine screening usually begins at age 45.  Direct examination of the colon should be repeated every 5-10 years through 52 years of age. However, you may need to be screened more often if early forms of precancerous polyps or small growths are found.  Skin Cancer  Check your skin from head to toe regularly.  Tell your health care provider about any new moles or changes in moles, especially if there is a change in a mole's shape or color.  Also tell your health care provider if you have a mole that is larger than the size of a pencil eraser.  Always use sunscreen. Apply sunscreen liberally and repeatedly throughout the day.  Protect yourself by wearing long sleeves, pants, a wide-brimmed hat, and sunglasses whenever you are outside.  Heart disease, diabetes, and high blood pressure  High blood pressure causes heart disease and increases the risk of stroke. High blood pressure is more likely to develop in: ? People who have blood pressure in the high end of the normal range (130-139/85-89 mm Hg). ? People who are overweight or obese. ? People who are African American.  If you are 65-44 years of age, have your blood pressure checked every 3-5 years. If you are 35 years of age or older, have your blood pressure checked every year. You should have your blood pressure measured twice-once when you are at a hospital or clinic, and once when you are not at a hospital or clinic.  Record the average of the two measurements. To check your blood pressure when you are not at a hospital or clinic, you can use: ? An automated blood pressure machine at a pharmacy. ? A home blood pressure monitor.  If you are between 55 years and 4 years old, ask your health care provider if you should take aspirin to prevent strokes.  Have regular diabetes screenings. This involves taking a blood sample to check your fasting blood sugar level. ? If you are at a normal weight and have a low risk for diabetes, have this test once every three years after 52 years of age. ? If you are overweight and have a high risk for diabetes, consider being tested at a younger age or more often. Preventing infection Hepatitis B  If you have a higher risk for hepatitis B, you should be screened for this virus. You are considered at high risk for hepatitis B if: ? You were born in a country where hepatitis B is common. Ask your health care provider which countries are considered high risk. ? Your parents were born in a high-risk country, and you have not been immunized against hepatitis B (hepatitis B vaccine). ? You have HIV or AIDS. ? You use needles to inject street drugs. ? You live with someone who has hepatitis B. ? You have had sex with someone who has hepatitis B. ? You get hemodialysis treatment. ? You take certain medicines for conditions, including cancer, organ transplantation, and autoimmune conditions.  Hepatitis C  Blood testing is recommended for: ? Everyone born from [birth year] through 1965. ? Anyone with known risk factors for hepatitis C.  Sexually transmitted infections (STIs)  You should be screened for sexually transmitted infections (STIs) including gonorrhea and chlamydia if: ? You are sexually active and are younger than 52 years of age. ? You are older than  52 years of age and your health care provider tells you that you are at risk for this type of infection. ? Your sexual  activity has changed since you were last screened and you are at an increased risk for chlamydia or gonorrhea. Ask your health care provider if you are at risk.  If you do not have HIV, but are at risk, it may be recommended that you take a prescription medicine daily to prevent HIV infection. This is called pre-exposure prophylaxis (PrEP). You are considered at risk if: ? You are sexually active and do not regularly use condoms or know the HIV status of your partner(s). ? You take drugs by injection. ? You are sexually active with a partner who has HIV.  Talk with your health care provider about whether you are at high risk of being infected with HIV. If you choose to begin PrEP, you should first be tested for HIV. You should then be tested every 3 months for as long as you are taking PrEP. Pregnancy  If you are premenopausal and you may become pregnant, ask your health care provider about preconception counseling.  If you may become pregnant, take 400 to 800 micrograms (mcg) of folic acid every day.  If you want to prevent pregnancy, talk to your health care provider about birth control (contraception). Osteoporosis and menopause  Osteoporosis is a disease in which the bones lose minerals and strength with aging. This can result in serious bone fractures. Your risk for osteoporosis can be identified using a bone density scan.  If you are 29 years of age or older, or if you are at risk for osteoporosis and fractures, ask your health care provider if you should be screened.  Ask your health care provider whether you should take a calcium or vitamin D supplement to lower your risk for osteoporosis.  Menopause may have certain physical symptoms and risks.  Hormone replacement therapy may reduce some of these symptoms and risks. Talk to your health care provider about whether hormone replacement therapy is right for you. Follow these instructions at home:  Schedule regular health, dental,  and eye exams.  Stay current with your immunizations.  Do not use any tobacco products including cigarettes, chewing tobacco, or electronic cigarettes.  If you are pregnant, do not drink alcohol.  If you are breastfeeding, limit how much and how often you drink alcohol.  Limit alcohol intake to no more than 1 drink per day for nonpregnant women. One drink equals 12 ounces of beer, 5 ounces of wine, or 1 ounces of hard liquor.  Do not use street drugs.  Do not share needles.  Ask your health care provider for help if you need support or information about quitting drugs.  Tell your health care provider if you often feel depressed.  Tell your health care provider if you have ever been abused or do not feel safe at home. This information is not intended to replace advice given to you by your health care provider. Make sure you discuss any questions you have with your health care provider. Document Released: 04/22/2011 Document Revised: 03/14/2016 Document Reviewed: 07/11/2015 Elsevier Interactive Patient Education  [employer].

## 2018-03-12 LAB — PAP, TP IMAGING W/ HPV RNA, RFLX HPV TYPE 16,18/45: HPV DNA High Risk: NOT DETECTED

## 2018-03-30 ENCOUNTER — Encounter: Payer: PPO | Attending: Physical Medicine & Rehabilitation | Admitting: Physical Medicine & Rehabilitation

## 2018-03-30 ENCOUNTER — Other Ambulatory Visit: Payer: Self-pay

## 2018-03-30 ENCOUNTER — Encounter: Payer: Self-pay | Admitting: Physical Medicine & Rehabilitation

## 2018-03-30 VITALS — BP 122/72 | HR 84 | Ht 67.0 in | Wt 388.0 lb

## 2018-03-30 DIAGNOSIS — Z7982 Long term (current) use of aspirin: Secondary | ICD-10-CM | POA: Diagnosis not present

## 2018-03-30 DIAGNOSIS — E119 Type 2 diabetes mellitus without complications: Secondary | ICD-10-CM | POA: Insufficient documentation

## 2018-03-30 DIAGNOSIS — Z79899 Other long term (current) drug therapy: Secondary | ICD-10-CM | POA: Insufficient documentation

## 2018-03-30 DIAGNOSIS — M545 Low back pain: Secondary | ICD-10-CM | POA: Diagnosis not present

## 2018-03-30 DIAGNOSIS — M47816 Spondylosis without myelopathy or radiculopathy, lumbar region: Secondary | ICD-10-CM | POA: Diagnosis not present

## 2018-03-30 DIAGNOSIS — G8929 Other chronic pain: Secondary | ICD-10-CM | POA: Diagnosis not present

## 2018-03-30 DIAGNOSIS — E669 Obesity, unspecified: Secondary | ICD-10-CM | POA: Insufficient documentation

## 2018-03-30 DIAGNOSIS — I10 Essential (primary) hypertension: Secondary | ICD-10-CM | POA: Insufficient documentation

## 2018-03-30 DIAGNOSIS — Z5181 Encounter for therapeutic drug level monitoring: Secondary | ICD-10-CM | POA: Diagnosis not present

## 2018-03-30 DIAGNOSIS — Z794 Long term (current) use of insulin: Secondary | ICD-10-CM | POA: Diagnosis not present

## 2018-03-30 DIAGNOSIS — M7581 Other shoulder lesions, right shoulder: Secondary | ICD-10-CM

## 2018-03-30 DIAGNOSIS — M778 Other enthesopathies, not elsewhere classified: Secondary | ICD-10-CM | POA: Insufficient documentation

## 2018-03-30 MED ORDER — CARISOPRODOL 350 MG PO TABS: 350.0000 mg | ORAL_TABLET | Freq: Three times a day (TID) | ORAL | 2 refills | Status: DC

## 2018-03-30 MED ORDER — OXYCODONE HCL 10 MG PO TABS: 10.0000 mg | ORAL_TABLET | Freq: Three times a day (TID) | ORAL | 0 refills | Status: DC | PRN

## 2018-03-30 NOTE — Progress Notes (Signed)
Subjective:    Patient ID: Kristen [name], female    DOB: [birth year], 52 y.o.   MRN: [id number]  HPI    Kristen [name] is here in follow up of her chronic pain. She states she has been depressed lately as she lost a close friend who's she has know for 30 years. Her back pain comes and goes. She is having more pain today with the rainy and damp weather we've had lately   She remains on oxycodone for pain control. This allows her to function at a household and community level. She likes to get out in the yard. 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   Kristen [name] is working on her diet and weight as well. Losing weight has been a challenge however.   Pain Inventory Average Pain 7 Pain Right Now 8 My pain is constant, sharp and stabbing  In the last 24 hours, has pain interfered with the following? General activity 8 Relation with others 8 Enjoyment of life 8 What TIME of day is your pain at its worst? all Sleep (in general) Poor  Pain is worse with: walking, bending, sitting, standing and some activites Pain improves with: rest, heat/ice and medication Relief from Meds: 6  Mobility walk without assistance ability to climb steps?  no do you drive?  yes  Function disabled: date disabled 03/2012 I need assistance with the following:  meal prep, household duties and shopping  Neuro/Psych bladder control problems weakness spasms confusion depression anxiety  Prior Studies Any changes since last visit?  no  Physicians involved in your care Any changes since last visit?  no   Family History  Problem Relation Age of Onset  . Hypertension Maternal Grandmother   . Stroke Maternal Grandmother   . Diabetes Maternal Grandfather   . Cancer Mother        lung  . Cancer Father   . Diabetes Sister   . Breast cancer Cousin   . Heart attack Neg Hx    Social History   Socioeconomic History  . Marital status: Single    Spouse name: Not on file  . Number of  children: Not on file  . Years of education: Not on file  . Highest education level: Not on file  Occupational History  . Not on file  Social Needs  . Financial resource strain: Not on file  . Food insecurity:    Worry: Not on file    Inability: Not on file  . Transportation needs:    Medical: Not on file    Non-medical: Not on file  Tobacco Use  . Smoking status: Never Smoker  . Smokeless tobacco: Never Used  Substance and Sexual Activity  . Alcohol use: No    Alcohol/week: 0.0 oz  . Drug use: No  . Sexual activity: Never    Comment: 1st intercourse 89 yo-1 partner  Lifestyle  . Physical activity:    Days per week: Not on file    Minutes per session: Not on file  . Stress: Not on file  Relationships  . Social connections:    Talks on phone: Not on file    Gets together: Not on file    Attends religious service: Not on file    Active member of club or organization: Not on file    Attends meetings of clubs or organizations: Not on file    Relationship status: Not on file  Other Topics Concern  . Not  on file  Social History Narrative  . Not on file   Past Surgical History:  Procedure Laterality Date  . ABDOMINAL SURGERY     hernia repair  . CHOLECYSTECTOMY  [id number]  . COLONOSCOPY WITH PROPOFOL N/A 07/31/2016   Procedure: COLONOSCOPY WITH PROPOFOL;  Surgeon: Arta Silence, MD;  Location: [facility];  Service: Endoscopy;  Laterality: N/A;  . FOOT SURGERY Right 06/2007  . KNEE ARTHROSCOPY  09/2008   left  . KNEE ARTHROSCOPY  april 2011   left  . NASAL SINUS SURGERY    . NASAL SINUS SURGERY  1990  . SHOULDER SURGERY  2004   left   Past Medical History:  Diagnosis Date  . Anxiety   . Asthma   . 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 Depression   . Diabetes mellitus   . Hypertension   . Migraines   . Near syncope   . Obesity   . PCOS (polycystic ovarian syndrome)   . Peripheral edema   . Sleep apnea    uses cpap set on 10  . 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    BP 122/72   Pulse 84   Ht 5\' 7"  (1.702 m)   Wt (!) 388 lb (176 kg)   SpO2 94%   BMI 60.77 kg/m   Opioid Risk Score:   Fall Risk Score:  `1  Depression screen PHQ 2/9  Depression screen [facility] 2/9 03/30/2018 02/20/2018 01/26/2018 12/05/2017 12/25/2016 07/01/2016  Decreased Interest 2 0 3 3 3 2   Down, Depressed, Hopeless 2 0 3 3 3 3   PHQ - 2 Score 4 0 6 6 6 5   Altered sleeping - - 0 - - 3  Tired, decreased energy - - 2 - - 3  Change in appetite - - 0 - - 1  Feeling bad or failure about yourself  - - 3 - - 3  Trouble concentrating - - 0 - - 1  Moving slowly or fidgety/restless - - 0 - - 2  Suicidal thoughts - - 0 - - 1  PHQ-9 Score - - 11 - - 19  Difficult doing work/chores - - Very difficult - - -    Review of Systems  Constitutional: Negative.   HENT: Negative.   Eyes: Negative.   Respiratory: Negative.   Cardiovascular: Negative.   Gastrointestinal: Negative.   Endocrine: Negative.   Genitourinary: Negative.   Musculoskeletal: Negative.   Skin: Negative.   Allergic/Immunologic: Negative.   Neurological: Negative.   Hematological: Negative.   Psychiatric/Behavioral: Negative.   All other systems reviewed and are negative.      Objective:   Physical Exam  General: No acute distress HEENT: EOMI, oral membranes moist Cards: reg rate  Chest: normal effort Abdomen: Soft, NT, ND Skin: dry, intact Extremities: no edema Musculoskeletal: LLE tr to 1+ edema Normal Muscle Bulk and Muscle Testing Reveals: Upper Extremities: Full ROM and Muscle Strength remains 5/5 Lumbar tendereness in lower segments. Hamstrings tight.  Neuro: alert and oriented to person, place, and time.  Marland Kitchen  Psychiatric: pleasant and appropriate .       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 Second RF for next month.  -We will  continue the controlled substance monitoring program, this consists of regular clinic visits, examinations, routine drug screening, pill counts as well as use of [place]  Controlled Substance Reporting System. [place].    -I provided hamstring stretches for patient 2. Morbid obesity: Continue Healthy Diet Regime and HEP -she understands importance of weight loss 3. Type II diabetes: Dr. Buddy Duty following -continue per endocrine fecs. Still needs work 4. Reactive depression:  -improved onzoloft 200mg  daily   -seems to be coping well with loss of friend 5. Recent PE: on eliquis per primary -will need to stay on this for 12 months  15 minutes of face to face patient care time wasspent during this visit. All questions were encouraged and answered. Follow up with NP in 2 months.

## 2018-03-30 NOTE — Patient Instructions (Signed)
PLEASE FEEL FREE TO CALL OUR OFFICE WITH ANY PROBLEMS OR QUESTIONS ([phone])      

## 2018-04-14 ENCOUNTER — Other Ambulatory Visit: Payer: Medicare HMO

## 2018-04-14 ENCOUNTER — Ambulatory Visit: Payer: Medicare HMO | Admitting: Hematology and Oncology

## 2018-04-15 ENCOUNTER — Other Ambulatory Visit: Payer: Self-pay | Admitting: Hematology and Oncology

## 2018-04-15 DIAGNOSIS — I2699 Other pulmonary embolism without acute cor pulmonale: Secondary | ICD-10-CM

## 2018-04-20 ENCOUNTER — Encounter: Payer: Self-pay | Admitting: Hematology and Oncology

## 2018-04-20 ENCOUNTER — Telehealth: Payer: Self-pay | Admitting: Hematology and Oncology

## 2018-04-20 ENCOUNTER — Inpatient Hospital Stay (HOSPITAL_BASED_OUTPATIENT_CLINIC_OR_DEPARTMENT_OTHER): Payer: PPO | Admitting: Hematology and Oncology

## 2018-04-20 ENCOUNTER — Inpatient Hospital Stay: Payer: PPO | Attending: Hematology and Oncology

## 2018-04-20 DIAGNOSIS — Z8673 Personal history of transient ischemic attack (TIA), and cerebral infarction without residual deficits: Secondary | ICD-10-CM

## 2018-04-20 DIAGNOSIS — I129 Hypertensive chronic kidney disease with stage 1 through stage 4 chronic kidney disease, or unspecified chronic kidney disease: Secondary | ICD-10-CM | POA: Insufficient documentation

## 2018-04-20 DIAGNOSIS — H47011 Ischemic optic neuropathy, right eye: Secondary | ICD-10-CM | POA: Diagnosis not present

## 2018-04-20 DIAGNOSIS — Z7982 Long term (current) use of aspirin: Secondary | ICD-10-CM

## 2018-04-20 DIAGNOSIS — G453 Amaurosis fugax: Secondary | ICD-10-CM | POA: Insufficient documentation

## 2018-04-20 DIAGNOSIS — G8929 Other chronic pain: Secondary | ICD-10-CM | POA: Diagnosis not present

## 2018-04-20 DIAGNOSIS — E1122 Type 2 diabetes mellitus with diabetic chronic kidney disease: Secondary | ICD-10-CM

## 2018-04-20 DIAGNOSIS — N183 Chronic kidney disease, stage 3 unspecified: Secondary | ICD-10-CM | POA: Insufficient documentation

## 2018-04-20 DIAGNOSIS — I2699 Other pulmonary embolism without acute cor pulmonale: Secondary | ICD-10-CM

## 2018-04-20 DIAGNOSIS — Z794 Long term (current) use of insulin: Secondary | ICD-10-CM | POA: Diagnosis not present

## 2018-04-20 DIAGNOSIS — H5310 Unspecified subjective visual disturbances: Secondary | ICD-10-CM | POA: Diagnosis not present

## 2018-04-20 DIAGNOSIS — Z7901 Long term (current) use of anticoagulants: Secondary | ICD-10-CM | POA: Insufficient documentation

## 2018-04-20 DIAGNOSIS — H47012 Ischemic optic neuropathy, left eye: Secondary | ICD-10-CM | POA: Diagnosis not present

## 2018-04-20 DIAGNOSIS — Z79899 Other long term (current) drug therapy: Secondary | ICD-10-CM

## 2018-04-20 LAB — CBC WITH DIFFERENTIAL/PLATELET
Basophils Absolute: 0 10*3/uL (ref 0.0–0.1)
Basophils Relative: 1 %
Eosinophils Absolute: 0.1 10*3/uL (ref 0.0–0.5)
Eosinophils Relative: 1 %
HCT: 40.3 % (ref 34.8–46.6)
Hemoglobin: 13.2 g/dL (ref 11.6–15.9)
Lymphocytes Relative: 27 %
Lymphs Abs: 2.1 10*3/uL (ref 0.9–3.3)
MCH: 29.1 pg (ref 25.1–34.0)
MCHC: 32.8 g/dL (ref 31.5–36.0)
MCV: 88.8 fL (ref 79.5–101.0)
Monocytes Absolute: 0.7 10*3/uL (ref 0.1–0.9)
Monocytes Relative: 9 %
Neutro Abs: 4.8 10*3/uL (ref 1.5–6.5)
Neutrophils Relative %: 62 %
Platelets: 243 10*3/uL (ref 145–400)
RBC: 4.54 MIL/uL (ref 3.70–5.45)
RDW: 13.6 % (ref 11.2–14.5)
WBC: 7.8 10*3/uL (ref 3.9–10.3)

## 2018-04-20 LAB — COMPREHENSIVE METABOLIC PANEL
ALT: 23 U/L (ref 0–44)
AST: 18 U/L (ref 15–41)
Albumin: 3.8 g/dL (ref 3.5–5.0)
Alkaline Phosphatase: 75 U/L (ref 38–126)
Anion gap: 7 (ref 5–15)
BUN: 21 mg/dL — ABNORMAL HIGH (ref 6–20)
CO2: 30 mmol/L (ref 22–32)
Calcium: 9.5 mg/dL (ref 8.9–10.3)
Chloride: 102 mmol/L (ref 98–111)
Creatinine, Ser: 1.25 mg/dL — ABNORMAL HIGH (ref 0.44–1.00)
GFR calc Af Amer: 56 mL/min — ABNORMAL LOW (ref >60)
GFR calc non Af Amer: 49 mL/min — ABNORMAL LOW (ref >60)
Glucose, Bld: 191 mg/dL — ABNORMAL HIGH (ref 70–99)
Potassium: 4.6 mmol/L (ref 3.5–5.1)
Sodium: 139 mmol/L (ref 135–145)
Total Bilirubin: 0.4 mg/dL (ref 0.3–1.2)
Total Protein: 7.4 g/dL (ref 6.5–8.1)

## 2018-04-20 NOTE — Assessment & Plan Note (Signed)
She has mild chronic kidney disease stage III due to diabetes and other vascular complication We will monitor her blood counts carefully while on Eliquis

## 2018-04-20 NOTE — Telephone Encounter (Signed)
Gave patient avs and calendar of upcoming July 2020 appointments.

## 2018-04-20 NOTE — Assessment & Plan Note (Signed)
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

## 2018-04-20 NOTE — Progress Notes (Signed)
Early OFFICE PROGRESS NOTE  Kristen Cruel, MD  ASSESSMENT & PLAN:  Pulmonary embolism [facility]) She is doing well without any complications from excessive bleeding being on anticoagulation treatment Due to her poor mobility and high risk of recurrence of DVT, I recommend extended duration Eliquis long-term I warned her about risk of bleeding with combination treatment along with antiplatelet agents She would need repeat blood work, history and physical examination at least twice a year while on treatment I plan to see her once a year for further follow-up  Amaurosis fugax The patient has intermittent visual disturbances She has appointment to see ophthalmologist for further management She will continue dual antiplatelet agent with anticoagulation therapy due to high risk of vascular complication from her diabetes and other cardiovascular risk factors  CKD (chronic kidney disease), stage III ([place]) She has mild chronic kidney disease stage III due to diabetes and other vascular complication We will monitor her blood counts carefully while on Eliquis    No orders of the defined types were placed in this encounter.   INTERVAL HISTORY: Kristen [name] 52 y.o. female returns for further follow-up due to history of DVT She denies cough, chest pain or shortness of breath She continues to have chronic bilateral lower extremity edema, stable Her vision is poor and she is due to see an ophthalmologist The patient denies any recent signs or symptoms of bleeding such as spontaneous epistaxis, hematuria or hematochezia. She bruises easily She denies recent falls She has been taking her medications without missing doses She has chronic back pain and her mobility is poor She is wearing elastic compression hose on a regular basis  SUMMARY OF HEMATOLOGIC HISTORY:  Please see my detailed consult note dated 03/23/2017 for further details Kristen [name] was seen at the  request by hospitalist due to DVT and PE and abnormal lymphocytosis This patient is morbidly obese with significant cardiovascular risk factors including poorly controlled diabetes, hypertension, prior history of stroke and others. According to the patient, she had accidental fall at home a month ago but did not sustain major injury The patient also had poorly controlled asthma and was started on steroid injection along with oral corticosteroids. That in turn cause significant urinary frequency presumably due to severe uncontrolled hyperglycemia at home. She was also started on diuretic for fluid retention and admits she is drinking less liquid and possibly dehydrated prior to admission. Her mobility is poor due to recent fall and asthma. Subsequently, she started to feel progressive shortness of breath and chest pressure. She was directed to the emergency department and underwent ultrasound venous Doppler along with CT imaging which show significant changes suggestive of massive PE. She has chronic leg swelling and recurrent cellulitis with abnormal skin changes on the legs She had no prior history or diagnosis of cancer. Her age appropriate screening programs are up-to-date. She had prior surgeries before and never had perioperative thromboembolic events. The patient had never been pregnant before There is no family history of blood clots or miscarriages. The patient was subsequently discharged from the hospital on Eliquis along with 81 mg aspirin  I have reviewed the past medical history, past surgical history, social history and family history with the patient and they are unchanged from previous note.  ALLERGIES:  is allergic to cafergot; glucophage [metformin hcl]; and canagliflozin.  MEDICATIONS:  Current Outpatient Medications  Medication Sig Dispense Refill  . ACCU-CHEK AVIVA PLUS test strip USE AS DIRECTED TWICE A DAY IN  VITRO  12  . albuterol (ACCUNEB) 1.25 MG/3ML nebulizer solution  Inhale 1 ampule into the lungs 3 (three) times daily as needed for wheezing or shortness of breath.     Marland Kitchen albuterol (VENTOLIN HFA) 108 (90 Base) MCG/ACT inhaler Inhale 2 puffs into the lungs every 4 (four) hours as needed for wheezing or shortness of breath.     Marland Kitchen apixaban (ELIQUIS) 5 MG TABS tablet Take two tabs twice a day for 7 days, then take one tab twice a day for a year. 74 tablet 0  . aspirin 81 MG tablet Take 81 mg by mouth daily.    Marland Kitchen atorvastatin (LIPITOR) 40 MG tablet Take 40 mg by mouth daily.    . BD PEN NEEDLE NANO U/F 32G X 4 MM MISC 2 (two) times daily. as directed  11  . carisoprodol (SOMA) 350 MG tablet Take 1 tablet (350 mg total) by mouth 3 (three) times daily. 90 tablet 2  . Cholecalciferol (VITAMIN D3) 2000 units capsule Take 2,000 Units by mouth daily.     . Fluticasone-Salmeterol (ADVAIR) 500-50 MCG/DOSE AEPB Inhale 1 puff into the lungs every 12 (twelve) hours.    . insulin aspart (NOVOLOG FLEXPEN) 100 UNIT/ML FlexPen Inject 15 Units into the skin 3 (three) times daily with meals. 15 mL 11  . insulin glargine (LANTUS) 100 UNIT/ML injection Inject 80-100 Units into the skin at bedtime.     Marland Kitchen lisinopril (PRINIVIL,ZESTRIL) 20 MG tablet Take 1 tablet (20 mg total) by mouth daily. 30 tablet 0  . montelukast (SINGULAIR) 10 MG tablet Take 10 mg by mouth at bedtime.     Marland Kitchen nystatin cream (MYCOSTATIN) Apply 1 application topically 2 (two) times daily. 30 g 6  . oxybutynin (DITROPAN) 5 MG tablet TAKE 1 TABLET BY MOUTH TWICE A DAY AS NEEDED FOR BLADDER  3  . Oxycodone HCl 10 MG TABS Take 1 tablet (10 mg total) by mouth every 8 (eight) hours as needed. 90 tablet 0  . promethazine (PHENERGAN) 25 MG tablet Take 25 mg by mouth every 6 (six) hours as needed.     . senna-docusate (SENOKOT-S) 8.6-50 MG tablet Take 1 tablet by mouth at bedtime. 30 tablet 0  . sertraline (ZOLOFT) 100 MG tablet Take 200 mg by mouth daily.     Marland Kitchen torsemide (DEMADEX) 20 MG tablet Take 20 mg by mouth daily.    Marland Kitchen  VICTOZA 18 MG/3ML SOPN INJECT 1.8ML SUBCUTANEOUSLY ONCE DAILY FOR 30 DAYS  11   No current facility-administered medications for this visit.      REVIEW OF SYSTEMS:   Constitutional: Denies fevers, chills or night sweats Eyes: Denies blurriness of vision Ears, nose, mouth, throat, and face: Denies mucositis or sore throat Respiratory: Denies cough, dyspnea or wheezes Cardiovascular: Denies palpitation, chest discomfort  Gastrointestinal:  Denies nausea, heartburn or change in bowel habits Skin: Denies abnormal skin rashes Lymphatics: Denies new lymphadenopathy  Neurological:Denies numbness, tingling or new weaknesses Behavioral/Psych: Mood is stable, no new changes  All other systems were reviewed with the patient and are negative.  PHYSICAL EXAMINATION: ECOG PERFORMANCE STATUS: 2 - Symptomatic, <50% confined to bed  Vitals:   04/20/18 1247  BP: 127/74  Pulse: 91  Resp: 18  Temp: 98.3 F (36.8 C)  SpO2: 99%   Filed Weights   04/20/18 1247  Weight: (!) 386 lb 6.4 oz (175.3 kg)    GENERAL:alert, no distress and comfortable SKIN: Noted skin bruises EYES: normal, Conjunctiva are pink and non-injected, sclera  clear OROPHARYNX:no exudate, no erythema and lips, buccal mucosa, and tongue normal  NECK: supple, thyroid normal size, non-tender, without nodularity LYMPH:  no palpable lymphadenopathy in the cervical, axillary or inguinal LUNGS: clear to auscultation and percussion with normal breathing effort HEART: regular rate & rhythm and no murmurs with mild bilateral lower extremity edema ABDOMEN:abdomen soft, non-tender and normal bowel sounds Musculoskeletal:no cyanosis of digits and no clubbing  NEURO: alert & oriented x 3 with fluent speech, no focal motor/sensory deficits  LABORATORY DATA:  I have reviewed the data as listed     Component Value Date/Time   NA 139 04/20/2018 1217   NA 138 04/14/2017 1348   K 4.6 04/20/2018 1217   K 5.1 04/14/2017 1348   CL 102  04/20/2018 1217   CO2 30 04/20/2018 1217   CO2 27 04/14/2017 1348   GLUCOSE 191 (H) 04/20/2018 1217   GLUCOSE 257 (H) 04/14/2017 1348   BUN 21 (H) 04/20/2018 1217   BUN 16.3 04/14/2017 1348   CREATININE 1.25 (H) 04/20/2018 1217   CREATININE 1.2 (H) 04/14/2017 1348   CALCIUM 9.5 04/20/2018 1217   CALCIUM 9.8 04/14/2017 1348   PROT 7.4 04/20/2018 1217   PROT 7.1 04/14/2017 1348   ALBUMIN 3.8 04/20/2018 1217   ALBUMIN 3.4 (L) 04/14/2017 1348   AST 18 04/20/2018 1217   AST 24 04/14/2017 1348   ALT 23 04/20/2018 1217   ALT 24 04/14/2017 1348   ALKPHOS 75 04/20/2018 1217   ALKPHOS 75 04/14/2017 1348   BILITOT 0.4 04/20/2018 1217   BILITOT 0.40 04/14/2017 1348   GFRNONAA 49 (L) 04/20/2018 1217   GFRAA 56 (L) 04/20/2018 1217    No results found for: SPEP, UPEP  Lab Results  Component Value Date   WBC 7.8 04/20/2018   NEUTROABS 4.8 04/20/2018   HGB 13.2 04/20/2018   HCT 40.3 04/20/2018   MCV 88.8 04/20/2018   PLT 243 04/20/2018      Chemistry      Component Value Date/Time   NA 139 04/20/2018 1217   NA 138 04/14/2017 1348   K 4.6 04/20/2018 1217   K 5.1 04/14/2017 1348   CL 102 04/20/2018 1217   CO2 30 04/20/2018 1217   CO2 27 04/14/2017 1348   BUN 21 (H) 04/20/2018 1217   BUN 16.3 04/14/2017 1348   CREATININE 1.25 (H) 04/20/2018 1217   CREATININE 1.2 (H) 04/14/2017 1348      Component Value Date/Time   CALCIUM 9.5 04/20/2018 1217   CALCIUM 9.8 04/14/2017 1348   ALKPHOS 75 04/20/2018 1217   ALKPHOS 75 04/14/2017 1348   AST 18 04/20/2018 1217   AST 24 04/14/2017 1348   ALT 23 04/20/2018 1217   ALT 24 04/14/2017 1348   BILITOT 0.4 04/20/2018 1217   BILITOT 0.40 04/14/2017 1348       I spent 15 minutes counseling the patient face to face. The total time spent in the appointment was 20 minutes and more than 50% was on counseling.   All questions were answered. The patient knows to call the clinic with any problems, questions or concerns. No barriers to  learning was detected.    Heath Lark, MD 7/1/20192:18 PM

## 2018-04-20 NOTE — Assessment & Plan Note (Signed)
The patient has intermittent visual disturbances She has appointment to see ophthalmologist for further management She will continue dual antiplatelet agent with anticoagulation therapy due to high risk of vascular complication from her diabetes and other cardiovascular risk factors

## 2018-04-21 DIAGNOSIS — H4613 Retrobulbar neuritis, bilateral: Secondary | ICD-10-CM | POA: Diagnosis not present

## 2018-04-21 DIAGNOSIS — E113293 Type 2 diabetes mellitus with mild nonproliferative diabetic retinopathy without macular edema, bilateral: Secondary | ICD-10-CM | POA: Diagnosis not present

## 2018-04-21 DIAGNOSIS — H4611 Retrobulbar neuritis, right eye: Secondary | ICD-10-CM | POA: Diagnosis not present

## 2018-04-21 DIAGNOSIS — H47092 Other disorders of optic nerve, not elsewhere classified, left eye: Secondary | ICD-10-CM | POA: Diagnosis not present

## 2018-04-21 DIAGNOSIS — Z794 Long term (current) use of insulin: Secondary | ICD-10-CM | POA: Diagnosis not present

## 2018-04-21 DIAGNOSIS — Z8669 Personal history of other diseases of the nervous system and sense organs: Secondary | ICD-10-CM | POA: Diagnosis not present

## 2018-04-21 DIAGNOSIS — R51 Headache: Secondary | ICD-10-CM | POA: Diagnosis not present

## 2018-04-21 DIAGNOSIS — Z86711 Personal history of pulmonary embolism: Secondary | ICD-10-CM | POA: Diagnosis not present

## 2018-04-21 DIAGNOSIS — H2513 Age-related nuclear cataract, bilateral: Secondary | ICD-10-CM | POA: Diagnosis not present

## 2018-04-21 DIAGNOSIS — H5461 Unqualified visual loss, right eye, normal vision left eye: Secondary | ICD-10-CM | POA: Diagnosis not present

## 2018-04-21 DIAGNOSIS — Z7901 Long term (current) use of anticoagulants: Secondary | ICD-10-CM | POA: Diagnosis not present

## 2018-05-04 DIAGNOSIS — H543 Unqualified visual loss, both eyes: Secondary | ICD-10-CM | POA: Diagnosis not present

## 2018-05-04 DIAGNOSIS — M545 Low back pain: Secondary | ICD-10-CM | POA: Diagnosis not present

## 2018-05-04 DIAGNOSIS — Z6841 Body Mass Index (BMI) 40.0 and over, adult: Secondary | ICD-10-CM | POA: Diagnosis not present

## 2018-05-04 DIAGNOSIS — Z794 Long term (current) use of insulin: Secondary | ICD-10-CM | POA: Diagnosis not present

## 2018-05-04 DIAGNOSIS — E1122 Type 2 diabetes mellitus with diabetic chronic kidney disease: Secondary | ICD-10-CM | POA: Diagnosis not present

## 2018-05-04 DIAGNOSIS — M5137 Other intervertebral disc degeneration, lumbosacral region: Secondary | ICD-10-CM | POA: Diagnosis not present

## 2018-05-04 DIAGNOSIS — R3915 Urgency of urination: Secondary | ICD-10-CM | POA: Diagnosis not present

## 2018-05-08 DIAGNOSIS — E1122 Type 2 diabetes mellitus with diabetic chronic kidney disease: Secondary | ICD-10-CM | POA: Diagnosis not present

## 2018-05-08 DIAGNOSIS — E1165 Type 2 diabetes mellitus with hyperglycemia: Secondary | ICD-10-CM | POA: Diagnosis not present

## 2018-05-08 DIAGNOSIS — N181 Chronic kidney disease, stage 1: Secondary | ICD-10-CM | POA: Diagnosis not present

## 2018-05-08 DIAGNOSIS — Z794 Long term (current) use of insulin: Secondary | ICD-10-CM | POA: Diagnosis not present

## 2018-05-12 DIAGNOSIS — N3941 Urge incontinence: Secondary | ICD-10-CM | POA: Diagnosis not present

## 2018-05-15 DIAGNOSIS — H5461 Unqualified visual loss, right eye, normal vision left eye: Secondary | ICD-10-CM | POA: Diagnosis not present

## 2018-05-15 DIAGNOSIS — E119 Type 2 diabetes mellitus without complications: Secondary | ICD-10-CM | POA: Diagnosis not present

## 2018-05-15 DIAGNOSIS — E1165 Type 2 diabetes mellitus with hyperglycemia: Secondary | ICD-10-CM | POA: Diagnosis not present

## 2018-05-15 DIAGNOSIS — H2513 Age-related nuclear cataract, bilateral: Secondary | ICD-10-CM | POA: Diagnosis not present

## 2018-05-15 DIAGNOSIS — R3915 Urgency of urination: Secondary | ICD-10-CM | POA: Diagnosis not present

## 2018-05-15 DIAGNOSIS — R350 Frequency of micturition: Secondary | ICD-10-CM | POA: Diagnosis not present

## 2018-05-15 DIAGNOSIS — F329 Major depressive disorder, single episode, unspecified: Secondary | ICD-10-CM | POA: Diagnosis not present

## 2018-05-15 DIAGNOSIS — H47019 Ischemic optic neuropathy, unspecified eye: Secondary | ICD-10-CM | POA: Diagnosis not present

## 2018-05-15 DIAGNOSIS — H4611 Retrobulbar neuritis, right eye: Secondary | ICD-10-CM | POA: Diagnosis not present

## 2018-05-15 DIAGNOSIS — E113299 Type 2 diabetes mellitus with mild nonproliferative diabetic retinopathy without macular edema, unspecified eye: Secondary | ICD-10-CM | POA: Diagnosis not present

## 2018-05-15 DIAGNOSIS — H47092 Other disorders of optic nerve, not elsewhere classified, left eye: Secondary | ICD-10-CM | POA: Diagnosis not present

## 2018-05-15 DIAGNOSIS — Z7901 Long term (current) use of anticoagulants: Secondary | ICD-10-CM | POA: Diagnosis not present

## 2018-05-15 DIAGNOSIS — Z794 Long term (current) use of insulin: Secondary | ICD-10-CM | POA: Diagnosis not present

## 2018-05-15 DIAGNOSIS — E11649 Type 2 diabetes mellitus with hypoglycemia without coma: Secondary | ICD-10-CM | POA: Diagnosis not present

## 2018-05-15 DIAGNOSIS — E785 Hyperlipidemia, unspecified: Secondary | ICD-10-CM | POA: Diagnosis not present

## 2018-05-15 DIAGNOSIS — Z86711 Personal history of pulmonary embolism: Secondary | ICD-10-CM | POA: Diagnosis not present

## 2018-05-15 DIAGNOSIS — E113293 Type 2 diabetes mellitus with mild nonproliferative diabetic retinopathy without macular edema, bilateral: Secondary | ICD-10-CM | POA: Diagnosis not present

## 2018-05-15 DIAGNOSIS — Z833 Family history of diabetes mellitus: Secondary | ICD-10-CM | POA: Diagnosis not present

## 2018-05-15 DIAGNOSIS — Z8249 Family history of ischemic heart disease and other diseases of the circulatory system: Secondary | ICD-10-CM | POA: Diagnosis not present

## 2018-05-15 DIAGNOSIS — I2510 Atherosclerotic heart disease of native coronary artery without angina pectoris: Secondary | ICD-10-CM | POA: Diagnosis not present

## 2018-05-15 DIAGNOSIS — I10 Essential (primary) hypertension: Secondary | ICD-10-CM | POA: Diagnosis not present

## 2018-05-15 DIAGNOSIS — Z86718 Personal history of other venous thrombosis and embolism: Secondary | ICD-10-CM | POA: Diagnosis not present

## 2018-05-15 DIAGNOSIS — J45909 Unspecified asthma, uncomplicated: Secondary | ICD-10-CM | POA: Diagnosis not present

## 2018-05-15 DIAGNOSIS — H547 Unspecified visual loss: Secondary | ICD-10-CM | POA: Diagnosis not present

## 2018-05-15 DIAGNOSIS — G4730 Sleep apnea, unspecified: Secondary | ICD-10-CM | POA: Diagnosis not present

## 2018-05-15 DIAGNOSIS — M7918 Myalgia, other site: Secondary | ICD-10-CM | POA: Diagnosis not present

## 2018-05-21 ENCOUNTER — Other Ambulatory Visit: Payer: Self-pay

## 2018-05-21 NOTE — Patient Outreach (Signed)
[name]) Care Management  05/21/2018  Kristen [name] Mar 16, 1966 [id number]     Transition of Care Referral  Referral Date: 05/21/18 Referral Source: [facility]TA Discharge Report Date of Admission: unknown Diagnosis: unknown Date of Discharge: 05/19/18 Facility: [facility] Insurance: HTA   Referral received. No outreach warranted at this time. TOC will be completed by primary care provider office who will refer to [name] care mgmt if needed.     Plan: RN CM will close case at this time.    Enzo Montgomery, RN,BSN,CCM [place] Management Telephonic Care Management Coordinator Direct Phone: [phone] Toll Free: [phone] Fax: [phone]

## 2018-05-25 DIAGNOSIS — N3941 Urge incontinence: Secondary | ICD-10-CM | POA: Diagnosis not present

## 2018-05-28 DIAGNOSIS — I10 Essential (primary) hypertension: Secondary | ICD-10-CM | POA: Diagnosis not present

## 2018-05-28 DIAGNOSIS — J45991 Cough variant asthma: Secondary | ICD-10-CM | POA: Diagnosis not present

## 2018-05-28 DIAGNOSIS — R110 Nausea: Secondary | ICD-10-CM | POA: Diagnosis not present

## 2018-05-28 DIAGNOSIS — J3489 Other specified disorders of nose and nasal sinuses: Secondary | ICD-10-CM | POA: Diagnosis not present

## 2018-05-28 DIAGNOSIS — B372 Candidiasis of skin and nail: Secondary | ICD-10-CM | POA: Diagnosis not present

## 2018-05-28 DIAGNOSIS — F411 Generalized anxiety disorder: Secondary | ICD-10-CM | POA: Diagnosis not present

## 2018-05-28 DIAGNOSIS — H469 Unspecified optic neuritis: Secondary | ICD-10-CM | POA: Diagnosis not present

## 2018-05-29 ENCOUNTER — Encounter: Payer: PPO | Attending: Physical Medicine & Rehabilitation | Admitting: Registered Nurse

## 2018-05-29 ENCOUNTER — Encounter: Payer: Self-pay | Admitting: Registered Nurse

## 2018-05-29 VITALS — BP 134/80 | HR 78 | Resp 14 | Ht 67.0 in | Wt 386.0 lb

## 2018-05-29 DIAGNOSIS — M545 Low back pain: Secondary | ICD-10-CM | POA: Diagnosis not present

## 2018-05-29 DIAGNOSIS — Z79899 Other long term (current) drug therapy: Secondary | ICD-10-CM | POA: Diagnosis not present

## 2018-05-29 DIAGNOSIS — Z5181 Encounter for therapeutic drug level monitoring: Secondary | ICD-10-CM | POA: Diagnosis not present

## 2018-05-29 DIAGNOSIS — Z7982 Long term (current) use of aspirin: Secondary | ICD-10-CM | POA: Insufficient documentation

## 2018-05-29 DIAGNOSIS — M7918 Myalgia, other site: Secondary | ICD-10-CM | POA: Diagnosis not present

## 2018-05-29 DIAGNOSIS — G8929 Other chronic pain: Secondary | ICD-10-CM | POA: Diagnosis not present

## 2018-05-29 DIAGNOSIS — G894 Chronic pain syndrome: Secondary | ICD-10-CM | POA: Diagnosis not present

## 2018-05-29 DIAGNOSIS — M47816 Spondylosis without myelopathy or radiculopathy, lumbar region: Secondary | ICD-10-CM | POA: Diagnosis not present

## 2018-05-29 DIAGNOSIS — E669 Obesity, unspecified: Secondary | ICD-10-CM | POA: Insufficient documentation

## 2018-05-29 DIAGNOSIS — E119 Type 2 diabetes mellitus without complications: Secondary | ICD-10-CM | POA: Diagnosis not present

## 2018-05-29 DIAGNOSIS — I10 Essential (primary) hypertension: Secondary | ICD-10-CM | POA: Insufficient documentation

## 2018-05-29 DIAGNOSIS — Z794 Long term (current) use of insulin: Secondary | ICD-10-CM | POA: Diagnosis not present

## 2018-05-29 DIAGNOSIS — Z79891 Long term (current) use of opiate analgesic: Secondary | ICD-10-CM

## 2018-05-29 MED ORDER — OXYCODONE HCL 10 MG PO TABS: 10.0000 mg | ORAL_TABLET | Freq: Three times a day (TID) | ORAL | 0 refills | Status: DC | PRN

## 2018-05-29 NOTE — Progress Notes (Signed)
Subjective:    Patient ID: Kristen [name], female    DOB: 07-06-1966, 52 y.o.   MRN: [id number]  HPI: [name] is a 52 year old female who returns for follow up appointment for chronic pain and medication refill. She states her pain is located in her lower back also reports dental pain. She rates her pain 7. Her current exercise regime is walking.  [name] went to [name] on 07/-2/ 2019 for right eye vision loss. She was admitted to [facility] 05/15/2018 and discharged 05/19/2018, she was diagnosed with optic neuritis. She has followed up appointments with ophthalmologist she reports.   [name] Morphine Equivalent is 45.00 MME. Last UDS was Performed on 12/05/2017, it was consistent. Oral Swab Performed Today.    Pain Inventory Average Pain 7 Pain Right Now 7 My pain is sharp, burning and stabbing  In the last 24 hours, has pain interfered with the following? General activity 7 Relation with others 8 Enjoyment of life 8 What TIME of day is your pain at its worst? all Sleep (in general) Fair  Pain is worse with: walking, bending, standing and some activites Pain improves with: rest, heat/ice, therapy/exercise and medication Relief from Meds: 5  Mobility walk without assistance how many minutes can you walk? 5-10 ability to climb steps?  no do you drive?  yes Do you have any goals in this area?  yes  Function disabled: date disabled . I need assistance with the following:  meal prep, household duties and shopping Do you have any goals in this area?  yes  Neuro/Psych bladder control problems depression anxiety  Prior Studies Any changes since last visit?  no  Physicians involved in your care Any changes since last visit?  no   Family History  Problem Relation Age of Onset  . Hypertension Maternal Grandmother   . Stroke Maternal Grandmother   . Diabetes Maternal Grandfather   . Cancer Mother        lung  .  Cancer Father   . Diabetes Sister   . Breast cancer Cousin   . Heart attack Neg Hx    Social History   Socioeconomic History  . Marital status: Single    Spouse name: Not on file  . Number of children: Not on file  . Years of education: Not on file  . Highest education level: Not on file  Occupational History  . Not on file  Social Needs  . Financial resource strain: Not on file  . Food insecurity:    Worry: Not on file    Inability: Not on file  . Transportation needs:    Medical: Not on file    Non-medical: Not on file  Tobacco Use  . Smoking status: Never Smoker  . Smokeless tobacco: Never Used  Substance and Sexual Activity  . Alcohol use: No    Alcohol/week: 0.0 standard drinks  . Drug use: No  . Sexual activity: Never    Comment: 1st intercourse 26 yo-1 partner  Lifestyle  . Physical activity:    Days per week: Not on file    Minutes per session: Not on file  . Stress: Not on file  Relationships  . Social connections:    Talks on phone: Not on file    Gets together: Not on file    Attends religious service: Not on file    Active member of club or organization: Not on file  Attends meetings of clubs or organizations: Not on file    Relationship status: Not on file  Other Topics Concern  . Not on file  Social History Narrative  . Not on file   Past Surgical History:  Procedure Laterality Date  . ABDOMINAL SURGERY     hernia repair  . CHOLECYSTECTOMY  [id number]  . COLONOSCOPY WITH PROPOFOL N/A 07/31/2016   Procedure: COLONOSCOPY WITH PROPOFOL;  Surgeon: Arta Silence, MD;  Location: [facility];  Service: Endoscopy;  Laterality: N/A;  . FOOT SURGERY Right 06/2007  . KNEE ARTHROSCOPY  09/2008   left  . KNEE ARTHROSCOPY  april 2011   left  . NASAL SINUS SURGERY    . NASAL SINUS SURGERY  1990  . SHOULDER SURGERY  2004   left   Past Medical History:  Diagnosis Date  . Anxiety   . Asthma   . 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 Depression   .  Diabetes mellitus   . Hypertension   . Migraines   . Near syncope   . Obesity   . PCOS (polycystic ovarian syndrome)   . Peripheral edema   . Sleep apnea    uses cpap set on 10  . 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    BP 134/80 (BP Location: Left Wrist, Patient Position: Sitting, Cuff Size: Normal)   Pulse 78   Resp 14   Ht 5\' 7"  (1.702 m)   Wt (!) 386 lb (175.1 kg)   SpO2 93%   BMI 60.46 kg/m   Opioid Risk Score:   Fall Risk Score:  `1  Depression screen PHQ 2/9  Depression screen [facility] 2/9 03/30/2018 02/20/2018 01/26/2018 12/05/2017 12/25/2016 07/01/2016  Decreased Interest 2 0 3 3 3 2   Down, Depressed, Hopeless 2 0 3 3 3 3   PHQ - 2 Score 4 0 6 6 6 5   Altered sleeping - - 0 - - 3  Tired, decreased energy - - 2 - - 3  Change in appetite - - 0 - - 1  Feeling bad or failure about yourself  - - 3 - - 3  Trouble concentrating - - 0 - - 1  Moving slowly or fidgety/restless - - 0 - - 2  Suicidal thoughts - - 0 - - 1  PHQ-9 Score - - 11 - - 19  Difficult doing work/chores - - Very difficult - - -    Review of Systems  Constitutional: Negative.   HENT: Negative.   Eyes: Negative.   Respiratory: Positive for apnea and cough.   Gastrointestinal: Negative.   Endocrine: Negative.        High blood sugar  Genitourinary: Positive for difficulty urinating.  Musculoskeletal: Positive for back pain.  Neurological: Negative.   Hematological: Bruises/bleeds easily.  Psychiatric/Behavioral: Positive for dysphoric mood. The patient is nervous/anxious.   All other systems reviewed and are negative.      Objective:   Physical Exam  Constitutional: She is oriented to person, place, and time. She appears well-developed and well-nourished.  HENT:  Head: Normocephalic and atraumatic.  Neck: Normal range of motion. Neck supple.  Cardiovascular: Regular rhythm.  Pulmonary/Chest: Effort normal and breath sounds normal.  Musculoskeletal:  Normal Muscle Bulk and Muscle  Testing Reveals: Upper Extremities: Full ROM and Muscle Strength 5/5  Thoracic Paraspinal Tenderness: T-7-T-9 Mainly Right Side Lumbar Paraspinal Tenderness: L-3-L-5 Mainly Right Side Lower Extremities: Full ROM and Muscle Strength 5/5 Arises from  chair with ease Narrow Based Gait  Neurological: She is alert and oriented to person, place, and time.  Skin: Skin is warm and dry.  Psychiatric: She has a normal mood and affect. Her behavior is normal.  Nursing note and vitals reviewed.         Assessment & Plan:  1.Spondylosisof Lumbar Region/ Lumbar Facet arthropathy:05/29/2018 Refilled: Oxycodone 10 mg one tablet every 8 hours as needed #90. Second script e-scribe for the following month. We will continue the opioid monitoring program, this consists of regular clinic visits, examinations, urine drug screen, pill counts as well as use of [place] Controlled Substance Reporting System.  2. Morbid obesity: Continue Healthy Diet Regime and HEP.05/29/2018 3. Type II diabetes: Dr. Buddy Duty following.01/26/2018 4. Reactive Depression: Continue Zoloft.05/29/2018 5. Myofascial Muscle Pain: Continue current medication regimen with [facility].05/29/2018  20 minutes of face to face patient care time was spent during this visit. All questions were encouraged and answered.  F/U in 1 month

## 2018-06-01 DIAGNOSIS — N3941 Urge incontinence: Secondary | ICD-10-CM | POA: Diagnosis not present

## 2018-06-02 LAB — DRUG TOX MONITOR 1 W/CONF, ORAL FLD
Amphetamines: NEGATIVE ng/mL (ref <10)
Barbiturates: NEGATIVE ng/mL (ref <10)
Benzodiazepines: NEGATIVE ng/mL (ref <0.50)
Buprenorphine: NEGATIVE ng/mL (ref <0.10)
Carisoprodol: 97.5 ng/mL — ABNORMAL HIGH (ref <2.5)
Cocaine: NEGATIVE ng/mL (ref <5.0)
Codeine: NEGATIVE ng/mL (ref <2.5)
Dihydrocodeine: NEGATIVE ng/mL (ref <2.5)
Fentanyl: NEGATIVE ng/mL (ref <0.10)
Heroin Metabolite: NEGATIVE ng/mL (ref <1.0)
Hydrocodone: NEGATIVE ng/mL (ref <2.5)
Hydromorphone: NEGATIVE ng/mL (ref <2.5)
MARIJUANA: NEGATIVE ng/mL (ref <2.5)
MDMA: NEGATIVE ng/mL (ref <10)
Meprobamate: >250 ng/mL — ABNORMAL HIGH (ref <2.5)
Meprobamate: POSITIVE ng/mL — AB (ref <2.5)
Methadone: NEGATIVE ng/mL (ref <5.0)
Morphine: NEGATIVE ng/mL (ref <2.5)
Nicotine Metabolite: NEGATIVE ng/mL (ref <5.0)
Norhydrocodone: NEGATIVE ng/mL (ref <2.5)
Noroxycodone: 6.9 ng/mL — ABNORMAL HIGH (ref <2.5)
Opiates: POSITIVE ng/mL — AB (ref <2.5)
Oxycodone: 47.5 ng/mL — ABNORMAL HIGH (ref <2.5)
Oxymorphone: NEGATIVE ng/mL (ref <2.5)
Phencyclidine: NEGATIVE ng/mL (ref <10)
Tapentadol: NEGATIVE ng/mL (ref <5.0)
Tramadol: NEGATIVE ng/mL (ref <5.0)
Zolpidem: NEGATIVE ng/mL (ref <5.0)

## 2018-06-02 LAB — DRUG TOX ALC METAB W/CON, ORAL FLD: Alcohol Metabolite: NEGATIVE ng/mL (ref <25)

## 2018-06-04 ENCOUNTER — Telehealth: Payer: Self-pay | Admitting: *Deleted

## 2018-06-04 NOTE — Telephone Encounter (Signed)
Oral swab drug screen was consistent for prescribed medications.  ?

## 2018-06-09 DIAGNOSIS — H47092 Other disorders of optic nerve, not elsewhere classified, left eye: Secondary | ICD-10-CM | POA: Diagnosis not present

## 2018-06-09 DIAGNOSIS — Z794 Long term (current) use of insulin: Secondary | ICD-10-CM | POA: Diagnosis not present

## 2018-06-09 DIAGNOSIS — E1136 Type 2 diabetes mellitus with diabetic cataract: Secondary | ICD-10-CM | POA: Diagnosis not present

## 2018-06-09 DIAGNOSIS — E113293 Type 2 diabetes mellitus with mild nonproliferative diabetic retinopathy without macular edema, bilateral: Secondary | ICD-10-CM | POA: Diagnosis not present

## 2018-06-09 DIAGNOSIS — H4611 Retrobulbar neuritis, right eye: Secondary | ICD-10-CM | POA: Diagnosis not present

## 2018-06-09 DIAGNOSIS — H2513 Age-related nuclear cataract, bilateral: Secondary | ICD-10-CM | POA: Diagnosis not present

## 2018-06-09 DIAGNOSIS — Z79899 Other long term (current) drug therapy: Secondary | ICD-10-CM | POA: Diagnosis not present

## 2018-06-09 DIAGNOSIS — H469 Unspecified optic neuritis: Secondary | ICD-10-CM | POA: Diagnosis not present

## 2018-06-09 DIAGNOSIS — E669 Obesity, unspecified: Secondary | ICD-10-CM | POA: Diagnosis not present

## 2018-06-09 DIAGNOSIS — H53131 Sudden visual loss, right eye: Secondary | ICD-10-CM | POA: Diagnosis not present

## 2018-06-09 DIAGNOSIS — Z7901 Long term (current) use of anticoagulants: Secondary | ICD-10-CM | POA: Diagnosis not present

## 2018-06-09 DIAGNOSIS — Z6841 Body Mass Index (BMI) 40.0 and over, adult: Secondary | ICD-10-CM | POA: Diagnosis not present

## 2018-06-12 DIAGNOSIS — N3941 Urge incontinence: Secondary | ICD-10-CM | POA: Diagnosis not present

## 2018-06-19 DIAGNOSIS — N3941 Urge incontinence: Secondary | ICD-10-CM | POA: Diagnosis not present

## 2018-06-25 ENCOUNTER — Other Ambulatory Visit: Payer: Self-pay | Admitting: Women's Health

## 2018-06-25 DIAGNOSIS — Z1231 Encounter for screening mammogram for malignant neoplasm of breast: Secondary | ICD-10-CM

## 2018-06-26 DIAGNOSIS — N3941 Urge incontinence: Secondary | ICD-10-CM | POA: Diagnosis not present

## 2018-07-03 DIAGNOSIS — N3941 Urge incontinence: Secondary | ICD-10-CM | POA: Diagnosis not present

## 2018-07-03 DIAGNOSIS — R05 Cough: Secondary | ICD-10-CM | POA: Diagnosis not present

## 2018-07-10 DIAGNOSIS — N3941 Urge incontinence: Secondary | ICD-10-CM | POA: Diagnosis not present

## 2018-07-17 DIAGNOSIS — N3941 Urge incontinence: Secondary | ICD-10-CM | POA: Diagnosis not present

## 2018-07-24 ENCOUNTER — Ambulatory Visit: Payer: PPO

## 2018-07-24 DIAGNOSIS — N3941 Urge incontinence: Secondary | ICD-10-CM | POA: Diagnosis not present

## 2018-07-27 DIAGNOSIS — Z0000 Encounter for general adult medical examination without abnormal findings: Secondary | ICD-10-CM | POA: Diagnosis not present

## 2018-07-27 DIAGNOSIS — I10 Essential (primary) hypertension: Secondary | ICD-10-CM | POA: Diagnosis not present

## 2018-07-27 DIAGNOSIS — J45991 Cough variant asthma: Secondary | ICD-10-CM | POA: Diagnosis not present

## 2018-07-27 DIAGNOSIS — I872 Venous insufficiency (chronic) (peripheral): Secondary | ICD-10-CM | POA: Diagnosis not present

## 2018-07-27 DIAGNOSIS — F324 Major depressive disorder, single episode, in partial remission: Secondary | ICD-10-CM | POA: Diagnosis not present

## 2018-07-27 DIAGNOSIS — Z23 Encounter for immunization: Secondary | ICD-10-CM | POA: Diagnosis not present

## 2018-07-31 DIAGNOSIS — N3941 Urge incontinence: Secondary | ICD-10-CM | POA: Diagnosis not present

## 2018-08-03 DIAGNOSIS — G4733 Obstructive sleep apnea (adult) (pediatric): Secondary | ICD-10-CM | POA: Diagnosis not present

## 2018-08-07 ENCOUNTER — Encounter: Payer: PPO | Attending: Physical Medicine & Rehabilitation | Admitting: Registered Nurse

## 2018-08-07 ENCOUNTER — Encounter: Payer: Self-pay | Admitting: Registered Nurse

## 2018-08-07 ENCOUNTER — Encounter: Payer: PPO | Admitting: Registered Nurse

## 2018-08-07 VITALS — BP 133/82 | HR 76 | Resp 14 | Ht 67.0 in | Wt 391.0 lb

## 2018-08-07 DIAGNOSIS — M47816 Spondylosis without myelopathy or radiculopathy, lumbar region: Secondary | ICD-10-CM

## 2018-08-07 DIAGNOSIS — M25561 Pain in right knee: Secondary | ICD-10-CM | POA: Diagnosis not present

## 2018-08-07 DIAGNOSIS — E119 Type 2 diabetes mellitus without complications: Secondary | ICD-10-CM | POA: Diagnosis not present

## 2018-08-07 DIAGNOSIS — Z794 Long term (current) use of insulin: Secondary | ICD-10-CM | POA: Diagnosis not present

## 2018-08-07 DIAGNOSIS — M545 Low back pain: Secondary | ICD-10-CM | POA: Diagnosis not present

## 2018-08-07 DIAGNOSIS — Z79891 Long term (current) use of opiate analgesic: Secondary | ICD-10-CM

## 2018-08-07 DIAGNOSIS — I10 Essential (primary) hypertension: Secondary | ICD-10-CM | POA: Insufficient documentation

## 2018-08-07 DIAGNOSIS — Z5181 Encounter for therapeutic drug level monitoring: Secondary | ICD-10-CM | POA: Diagnosis not present

## 2018-08-07 DIAGNOSIS — M7918 Myalgia, other site: Secondary | ICD-10-CM

## 2018-08-07 DIAGNOSIS — Z79899 Other long term (current) drug therapy: Secondary | ICD-10-CM | POA: Diagnosis not present

## 2018-08-07 DIAGNOSIS — E669 Obesity, unspecified: Secondary | ICD-10-CM | POA: Diagnosis not present

## 2018-08-07 DIAGNOSIS — F329 Major depressive disorder, single episode, unspecified: Secondary | ICD-10-CM

## 2018-08-07 DIAGNOSIS — G8929 Other chronic pain: Secondary | ICD-10-CM

## 2018-08-07 DIAGNOSIS — M25562 Pain in left knee: Secondary | ICD-10-CM | POA: Diagnosis not present

## 2018-08-07 DIAGNOSIS — N3941 Urge incontinence: Secondary | ICD-10-CM | POA: Diagnosis not present

## 2018-08-07 DIAGNOSIS — G894 Chronic pain syndrome: Secondary | ICD-10-CM | POA: Diagnosis not present

## 2018-08-07 DIAGNOSIS — Z7982 Long term (current) use of aspirin: Secondary | ICD-10-CM | POA: Diagnosis not present

## 2018-08-07 MED ORDER — CARISOPRODOL 350 MG PO TABS: 350.0000 mg | ORAL_TABLET | Freq: Three times a day (TID) | ORAL | 2 refills | Status: DC

## 2018-08-07 MED ORDER — OXYCODONE HCL 10 MG PO TABS: 10.0000 mg | ORAL_TABLET | Freq: Three times a day (TID) | ORAL | 0 refills | Status: DC | PRN

## 2018-08-07 NOTE — Progress Notes (Signed)
Subjective:    Patient ID: Kristen [name], female    DOB: 12/06/1965, 52 y.o.   MRN: [id number]  HPI: [name] is a 52 year old female who is here fo follow up appointment for chronic pain and medication refill. She states her pain is located in her lower back and bilateral knees. She rates her pain 8. Her current exercise regime is walking.   Ms. Ketterman Morphine Equivalent is 45.00 MME. Last Oral Swab was Performed on 05/29/2018, it was consistent.    Pain Inventory Average Pain 7 Pain Right Now 8 My pain is sharp, burning, dull and aching  In the last 24 hours, has pain interfered with the following? General activity 0 Relation with others 3 Enjoyment of life 4 What TIME of day is your pain at its worst? varies with physical activity Sleep (in general) Fair  Pain is worse with: walking, bending, standing and some activites Pain improves with: rest, heat/ice and medication Relief from Meds: 4  Mobility walk without assistance how many minutes can you walk? 10 ability to climb steps?  no do you drive?  yes Do you have any goals in this area?  yes  Function disabled: date disabled . I need assistance with the following:  household duties and shopping Do you have any goals in this area?  yes  Neuro/Psych bladder control problems depression anxiety  Prior Studies Any changes since last visit?  no  Physicians involved in your care Any changes since last visit?  no   Family History  Problem Relation Age of Onset  . Hypertension Maternal Grandmother   . Stroke Maternal Grandmother   . Diabetes Maternal Grandfather   . Cancer Mother        lung  . Cancer Father   . Diabetes Sister   . Breast cancer Cousin   . Heart attack Neg Hx    Social History   Socioeconomic History  . Marital status: Single    Spouse name: Not on file  . Number of children: Not on file  . Years of education: Not on file  . Highest education level: Not on file    Occupational History  . Not on file  Social Needs  . Financial resource strain: Not on file  . Food insecurity:    Worry: Not on file    Inability: Not on file  . Transportation needs:    Medical: Not on file    Non-medical: Not on file  Tobacco Use  . Smoking status: Never Smoker  . Smokeless tobacco: Never Used  Substance and Sexual Activity  . Alcohol use: No    Alcohol/week: 0.0 standard drinks  . Drug use: No  . Sexual activity: Never    Comment: 1st intercourse 32 yo-1 partner  Lifestyle  . Physical activity:    Days per week: Not on file    Minutes per session: Not on file  . Stress: Not on file  Relationships  . Social connections:    Talks on phone: Not on file    Gets together: Not on file    Attends religious service: Not on file    Active member of club or organization: Not on file    Attends meetings of clubs or organizations: Not on file    Relationship status: Not on file  Other Topics Concern  . Not on file  Social History Narrative  . Not on file   Past Surgical History:  Procedure Laterality Date  .  ABDOMINAL SURGERY     hernia repair  . CHOLECYSTECTOMY  [id number]  . COLONOSCOPY WITH PROPOFOL N/A 07/31/2016   Procedure: COLONOSCOPY WITH PROPOFOL;  Surgeon: Arta Silence, MD;  Location: [facility];  Service: Endoscopy;  Laterality: N/A;  . FOOT SURGERY Right 06/2007  . KNEE ARTHROSCOPY  09/2008   left  . KNEE ARTHROSCOPY  april 2011   left  . NASAL SINUS SURGERY    . NASAL SINUS SURGERY  1990  . SHOULDER SURGERY  2004   left   Past Medical History:  Diagnosis Date  . Anxiety   . Asthma   . 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 Depression   . Diabetes mellitus   . Hypertension   . Migraines   . Near syncope   . Obesity   . PCOS (polycystic ovarian syndrome)   . Peripheral edema   . Sleep apnea    uses cpap set on 10  . 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    BP 133/82 (BP Location: Left Wrist, Patient  Position: Sitting, Cuff Size: Normal)   Pulse 76   Resp 14   Ht 5\' 7"  (1.702 m)   Wt (!) 391 lb (177.4 kg)   SpO2 93%   BMI 61.24 kg/m   Opioid Risk Score:   Fall Risk Score:  `1  Depression screen PHQ 2/9  Depression screen [facility] 2/9 03/30/2018 02/20/2018 01/26/2018 12/05/2017 12/25/2016 07/01/2016  Decreased Interest 2 0 3 3 3 2   Down, Depressed, Hopeless 2 0 3 3 3 3   PHQ - 2 Score 4 0 6 6 6 5   Altered sleeping - - 0 - - 3  Tired, decreased energy - - 2 - - 3  Change in appetite - - 0 - - 1  Feeling bad or failure about yourself  - - 3 - - 3  Trouble concentrating - - 0 - - 1  Moving slowly or fidgety/restless - - 0 - - 2  Suicidal thoughts - - 0 - - 1  PHQ-9 Score - - 11 - - 19  Difficult doing work/chores - - Very difficult - - -    Review of Systems  Constitutional: Negative.   HENT: Negative.   Eyes: Negative.   Respiratory: Negative.   Cardiovascular: Negative.   Gastrointestinal: Negative.   Endocrine: Negative.   Genitourinary: Negative.   Musculoskeletal: Positive for arthralgias, back pain and neck pain.  Skin: Positive for color change.  Allergic/Immunologic: Negative.   Neurological: Negative.   Hematological: Negative.   Psychiatric/Behavioral: Negative.        Objective:   Physical Exam  Constitutional: She is oriented to person, place, and time. She appears well-developed and well-nourished.  Morbid Obesity  HENT:  Head: Normocephalic and atraumatic.  Neck: Normal range of motion. Neck supple.  Cardiovascular: Normal rate and regular rhythm.  Pulmonary/Chest: Effort normal and breath sounds normal.  Musculoskeletal:  Normal Muscle Bulk and Muscle Testing Reveals: Upper Extremities: Full ROM and Muscle Strength 5/5 Lumbar Paraspinal Tendernness: L-3-L-5 Lower Extremities: Full ROM and Muscle Strength 5/5 Pitting edema wearing compression stockings Arises from chair slowly Narrow Based Gait   Neurological: She is alert and oriented to person, place,  and time.  Skin: Skin is warm and dry.  Psychiatric: She has a normal mood and affect. Her behavior is normal.  Nursing note and vitals reviewed.         Assessment & Plan:  1.Spondylosisof  Lumbar Region/ Lumbar Facet arthropathy:08/07/2018 Refilled: Oxycodone 10 mg one tablet every 8 hours as needed #90. Second script e-scribe for the following month. We will continue the opioid monitoring program, this consists of regular clinic visits, examinations, urine drug screen, pill counts as well as use of [place] Controlled Substance Reporting System.  2. Morbid obesity: Continue Healthy Diet Regime and HEP.08/07/2018 3. Type II diabetes: Dr. Buddy Duty following.08/07/2018 4. Reactive Depression: Continue Zoloft.08/07/2018 5. Myofascial Muscle Pain: Continuecurrent medication regimen withSoma.08/07/2018 6. Bilateral Chronic Knee Pain: Continue HEP as Tolerated. Continue to Monitor.   20 minutes of face to face patient care time was spent during this visit. All questions were encouraged and answered.  F/U in 2 months

## 2018-08-14 DIAGNOSIS — N3941 Urge incontinence: Secondary | ICD-10-CM | POA: Diagnosis not present

## 2018-08-21 DIAGNOSIS — N3941 Urge incontinence: Secondary | ICD-10-CM | POA: Diagnosis not present

## 2018-08-25 ENCOUNTER — Ambulatory Visit
Admission: RE | Admit: 2018-08-25 | Discharge: 2018-08-25 | Disposition: A | Discharge status: Home / Self Care | Payer: PPO | Source: Ambulatory Visit | Attending: Women's Health | Admitting: Women's Health

## 2018-08-25 DIAGNOSIS — Z1231 Encounter for screening mammogram for malignant neoplasm of breast: Secondary | ICD-10-CM | POA: Diagnosis not present

## 2018-08-27 ENCOUNTER — Other Ambulatory Visit: Payer: Self-pay | Admitting: Women's Health

## 2018-08-27 DIAGNOSIS — R928 Other abnormal and inconclusive findings on diagnostic imaging of breast: Secondary | ICD-10-CM

## 2018-08-28 DIAGNOSIS — N3941 Urge incontinence: Secondary | ICD-10-CM | POA: Diagnosis not present

## 2018-09-01 ENCOUNTER — Other Ambulatory Visit: Payer: Self-pay | Admitting: Women's Health

## 2018-09-01 ENCOUNTER — Ambulatory Visit
Admission: RE | Admit: 2018-09-01 | Discharge: 2018-09-01 | Disposition: A | Discharge status: Home / Self Care | Payer: PPO | Source: Ambulatory Visit | Attending: Women's Health | Admitting: Women's Health

## 2018-09-01 DIAGNOSIS — R928 Other abnormal and inconclusive findings on diagnostic imaging of breast: Secondary | ICD-10-CM

## 2018-09-01 DIAGNOSIS — N6323 Unspecified lump in the left breast, lower outer quadrant: Secondary | ICD-10-CM | POA: Diagnosis not present

## 2018-09-01 DIAGNOSIS — N6310 Unspecified lump in the right breast, unspecified quadrant: Secondary | ICD-10-CM

## 2018-09-21 DIAGNOSIS — Z8639 Personal history of other endocrine, nutritional and metabolic disease: Secondary | ICD-10-CM | POA: Diagnosis not present

## 2018-09-21 DIAGNOSIS — Z794 Long term (current) use of insulin: Secondary | ICD-10-CM | POA: Diagnosis not present

## 2018-09-21 DIAGNOSIS — E1122 Type 2 diabetes mellitus with diabetic chronic kidney disease: Secondary | ICD-10-CM | POA: Diagnosis not present

## 2018-09-21 DIAGNOSIS — Z6841 Body Mass Index (BMI) 40.0 and over, adult: Secondary | ICD-10-CM | POA: Diagnosis not present

## 2018-09-21 DIAGNOSIS — E1165 Type 2 diabetes mellitus with hyperglycemia: Secondary | ICD-10-CM | POA: Diagnosis not present

## 2018-09-21 DIAGNOSIS — E559 Vitamin D deficiency, unspecified: Secondary | ICD-10-CM | POA: Diagnosis not present

## 2018-09-21 DIAGNOSIS — N181 Chronic kidney disease, stage 1: Secondary | ICD-10-CM | POA: Diagnosis not present

## 2018-09-28 DIAGNOSIS — M1712 Unilateral primary osteoarthritis, left knee: Secondary | ICD-10-CM | POA: Diagnosis not present

## 2018-09-28 DIAGNOSIS — M1711 Unilateral primary osteoarthritis, right knee: Secondary | ICD-10-CM | POA: Diagnosis not present

## 2018-10-02 DIAGNOSIS — H52202 Unspecified astigmatism, left eye: Secondary | ICD-10-CM | POA: Diagnosis not present

## 2018-10-02 DIAGNOSIS — H5212 Myopia, left eye: Secondary | ICD-10-CM | POA: Diagnosis not present

## 2018-10-02 DIAGNOSIS — Z794 Long term (current) use of insulin: Secondary | ICD-10-CM | POA: Diagnosis not present

## 2018-10-02 DIAGNOSIS — H2513 Age-related nuclear cataract, bilateral: Secondary | ICD-10-CM | POA: Diagnosis not present

## 2018-10-02 DIAGNOSIS — E1136 Type 2 diabetes mellitus with diabetic cataract: Secondary | ICD-10-CM | POA: Diagnosis not present

## 2018-10-02 DIAGNOSIS — E113293 Type 2 diabetes mellitus with mild nonproliferative diabetic retinopathy without macular edema, bilateral: Secondary | ICD-10-CM | POA: Diagnosis not present

## 2018-10-02 DIAGNOSIS — H469 Unspecified optic neuritis: Secondary | ICD-10-CM | POA: Diagnosis not present

## 2018-10-05 ENCOUNTER — Telehealth: Payer: Self-pay | Admitting: Registered Nurse

## 2018-10-05 ENCOUNTER — Encounter: Payer: PPO | Admitting: Physical Medicine & Rehabilitation

## 2018-10-05 DIAGNOSIS — M47816 Spondylosis without myelopathy or radiculopathy, lumbar region: Secondary | ICD-10-CM

## 2018-10-05 MED ORDER — OXYCODONE HCL 10 MG PO TABS: 10.0000 mg | ORAL_TABLET | Freq: Three times a day (TID) | ORAL | 0 refills | Status: DC | PRN

## 2018-10-05 NOTE — Telephone Encounter (Signed)
Kristen [name] please call in her Oxy - I have rescheduled with ZS in January -- due to ZS illness.

## 2018-10-05 NOTE — Telephone Encounter (Signed)
Kristen [name] schedule appointment was rescheduled due to provider illness. PMP Aware web-site was reviewed, last Oxycodone prescription was filled on 09/14/2018. Oxycodone prescription was e-scribe. New scheduled appointment on o1/20/19. Kristen [name] is aware of the above

## 2018-10-07 DIAGNOSIS — J45909 Unspecified asthma, uncomplicated: Secondary | ICD-10-CM | POA: Diagnosis not present

## 2018-10-07 DIAGNOSIS — J019 Acute sinusitis, unspecified: Secondary | ICD-10-CM | POA: Diagnosis not present

## 2018-10-27 ENCOUNTER — Telehealth: Payer: Self-pay | Admitting: *Deleted

## 2018-10-27 NOTE — Telephone Encounter (Signed)
Kristen [name] has called and her primary has ordered her Robitussin with codeine. She has a URI and is coughing so much she nearly blacks out.  She know she cannot take with the oxycodone.  Please call her and advise.

## 2018-10-27 NOTE — Telephone Encounter (Signed)
Return Ms. Garland call, I explained when to take her medication. She verbalizes understanding.

## 2018-11-03 DIAGNOSIS — R05 Cough: Secondary | ICD-10-CM | POA: Diagnosis not present

## 2018-11-09 ENCOUNTER — Encounter: Payer: PPO | Attending: Physical Medicine & Rehabilitation | Admitting: Physical Medicine & Rehabilitation

## 2018-11-09 ENCOUNTER — Encounter: Payer: Self-pay | Admitting: Physical Medicine & Rehabilitation

## 2018-11-09 VITALS — BP 135/74 | HR 79 | Ht 67.0 in | Wt 387.0 lb

## 2018-11-09 DIAGNOSIS — Z5181 Encounter for therapeutic drug level monitoring: Secondary | ICD-10-CM | POA: Insufficient documentation

## 2018-11-09 DIAGNOSIS — Z7982 Long term (current) use of aspirin: Secondary | ICD-10-CM | POA: Diagnosis not present

## 2018-11-09 DIAGNOSIS — Z794 Long term (current) use of insulin: Secondary | ICD-10-CM | POA: Insufficient documentation

## 2018-11-09 DIAGNOSIS — Z79899 Other long term (current) drug therapy: Secondary | ICD-10-CM | POA: Diagnosis not present

## 2018-11-09 DIAGNOSIS — Z79891 Long term (current) use of opiate analgesic: Secondary | ICD-10-CM

## 2018-11-09 DIAGNOSIS — E669 Obesity, unspecified: Secondary | ICD-10-CM | POA: Diagnosis not present

## 2018-11-09 DIAGNOSIS — M47816 Spondylosis without myelopathy or radiculopathy, lumbar region: Secondary | ICD-10-CM | POA: Diagnosis not present

## 2018-11-09 DIAGNOSIS — G8929 Other chronic pain: Secondary | ICD-10-CM | POA: Diagnosis not present

## 2018-11-09 DIAGNOSIS — I10 Essential (primary) hypertension: Secondary | ICD-10-CM | POA: Diagnosis not present

## 2018-11-09 DIAGNOSIS — E119 Type 2 diabetes mellitus without complications: Secondary | ICD-10-CM | POA: Diagnosis not present

## 2018-11-09 DIAGNOSIS — M545 Low back pain: Secondary | ICD-10-CM | POA: Diagnosis not present

## 2018-11-09 DIAGNOSIS — G894 Chronic pain syndrome: Secondary | ICD-10-CM | POA: Diagnosis not present

## 2018-11-09 MED ORDER — OXYCODONE HCL 10 MG PO TABS: 10.0000 mg | ORAL_TABLET | Freq: Three times a day (TID) | ORAL | 0 refills | Status: DC | PRN

## 2018-11-09 NOTE — Patient Instructions (Signed)
PLEASE FEEL FREE TO CALL OUR OFFICE WITH ANY PROBLEMS OR QUESTIONS ([phone])      

## 2018-11-09 NOTE — Progress Notes (Signed)
Subjective:    Patient ID: Kristen [name], female    DOB: 15-Jul-1966, 53 y.o.   MRN: [id number]  HPI   Kristen [name] is here in follow up of her chronic pain. I last saw her in June. She is just getting over a nasty URI from last month. She received a couple rounds of antibiotics which eventually helped. She also had some problems with her stove which was blocked.   Her pain levels have been fairly steady. She walks daily. She keeps up her house. She remains on oxycodone for pain control. She takes 10mg  q8 prn. It allows her to be more functional at home and pursue her HEP which walking and moderate stretching.     Pain Inventory Average Pain 8 Pain Right Now 7 My pain is sharp, stabbing and aching  In the last 24 hours, has pain interfered with the following? General activity 0 Relation with others 0 Enjoyment of life 0 What TIME of day is your pain at its worst? evening Sleep (in general) Poor  Pain is worse with: walking, bending, standing and some activites Pain improves with: rest, heat/ice and medication Relief from Meds: 5  Mobility walk without assistance how many minutes can you walk? 10 ability to climb steps?  yes do you drive?  no Do you have any goals in this area?  no  Function disabled: date disabled .  Neuro/Psych depression anxiety  Prior Studies Any changes since last visit?  no  Physicians involved in your care Any changes since last visit?  no   Family History  Problem Relation Age of Onset  . Hypertension Maternal Grandmother   . Stroke Maternal Grandmother   . Diabetes Maternal Grandfather   . Cancer Mother        lung  . Cancer Father   . Diabetes Sister   . Breast cancer Cousin   . Heart attack Neg Hx    Social History   Socioeconomic History  . Marital status: Single    Spouse name: Not on file  . Number of children: Not on file  . Years of education: Not on file  . Highest education level: Not on file  Occupational  History  . Not on file  Social Needs  . Financial resource strain: Not on file  . Food insecurity:    Worry: Not on file    Inability: Not on file  . Transportation needs:    Medical: Not on file    Non-medical: Not on file  Tobacco Use  . Smoking status: Never Smoker  . Smokeless tobacco: Never Used  Substance and Sexual Activity  . Alcohol use: No    Alcohol/week: 0.0 standard drinks  . Drug use: No  . Sexual activity: Never    Comment: 1st intercourse 87 yo-1 partner  Lifestyle  . Physical activity:    Days per week: Not on file    Minutes per session: Not on file  . Stress: Not on file  Relationships  . Social connections:    Talks on phone: Not on file    Gets together: Not on file    Attends religious service: Not on file    Active member of club or organization: Not on file    Attends meetings of clubs or organizations: Not on file    Relationship status: Not on file  Other Topics Concern  . Not on file  Social History Narrative  . Not on file   Past Surgical History:  Procedure Laterality Date  . ABDOMINAL SURGERY     hernia repair  . CHOLECYSTECTOMY  [id number]  . COLONOSCOPY WITH PROPOFOL N/A 07/31/2016   Procedure: COLONOSCOPY WITH PROPOFOL;  Surgeon: Arta Silence, MD;  Location: [facility];  Service: Endoscopy;  Laterality: N/A;  . FOOT SURGERY Right 06/2007  . KNEE ARTHROSCOPY  09/2008   left  . KNEE ARTHROSCOPY  april 2011   left  . NASAL SINUS SURGERY    . NASAL SINUS SURGERY  1990  . SHOULDER SURGERY  2004   left   Past Medical History:  Diagnosis Date  . Anxiety   . Asthma   . 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 Depression   . Diabetes mellitus   . Hypertension   . Migraines   . Near syncope   . Obesity   . PCOS (polycystic ovarian syndrome)   . Peripheral edema   . Sleep apnea    uses cpap set on 10  . 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    BP 135/74   Pulse 79   Ht 5\' 7"  (1.702 m)   Wt (!) 387 lb (175.5  kg)   SpO2 93%   BMI 60.61 kg/m   Opioid Risk Score:   Fall Risk Score:  `1   Depression screen PHQ 2/9  Depression screen [facility] 2/9 03/30/2018 02/20/2018 01/26/2018 12/05/2017 12/25/2016 07/01/2016  Decreased Interest 2 0 3 3 3 2   Down, Depressed, Hopeless 2 0 3 3 3 3   PHQ - 2 Score 4 0 6 6 6 5   Altered sleeping - - 0 - - 3  Tired, decreased energy - - 2 - - 3  Change in appetite - - 0 - - 1  Feeling bad or failure about yourself  - - 3 - - 3  Trouble concentrating - - 0 - - 1  Moving slowly or fidgety/restless - - 0 - - 2  Suicidal thoughts - - 0 - - 1  PHQ-9 Score - - 11 - - 19  Difficult doing work/chores - - Very difficult - - -    Review of Systems  Constitutional: Negative.   HENT: Negative.   Eyes: Negative.   Respiratory: Negative.   Cardiovascular: Negative.   Gastrointestinal: Negative.   Endocrine: Negative.   Genitourinary: Negative.   Musculoskeletal: Positive for back pain.  Skin: Negative.   Allergic/Immunologic: Negative.   Neurological: Negative.   Hematological: Negative.   Psychiatric/Behavioral: Negative.   All other systems reviewed and are negative.      Objective:   Physical Exam  General: No acute distress HEENT: EOMI, oral membranes moist Cards: reg rate  Chest: normal effort Abdomen: Soft, NT, ND Skin: dry, intact Extremities: no edema Musculoskeletal: LLEtr to 1+ edema Normal Muscle Bulk and Muscle Testing Reveals: Upper Extremities: Full ROM and Muscle Strength remains5/5 LB TTP, slow to stand, functional gait.  Neuro: alert and oriented to person, place, and time.  Marland Kitchen  Psychiatric: pleasant and appropriate .       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 SecondRF for next month.  -We will continue the controlled substance monitoring program, this consists of regular clinic visits,  examinations, routine drug screening, pill counts as well as use of [place] Controlled Substance Reporting System. [place].    -drug swab today  2. Morbid  obesity: Continue Healthy Diet Regime and HEP -revisited today. 3. Type II diabetes: Dr. Buddy Duty following -continue per endocrine fecs.  4. Reactive depression:  -maintained onzoloft 200mg  daily             -seems to be coping well with loss of friend 5. Recent PE: on eliquis per primary -will need to stay on this for 12 months  15 minutesof face to face patient care time wasspent during this visit. All questions were encouraged and answered. Follow up with NP in 2 months. Marland Kitchen

## 2018-11-12 LAB — DRUG TOX MONITOR 1 W/CONF, ORAL FLD
Amphetamines: NEGATIVE ng/mL (ref <10)
Barbiturates: NEGATIVE ng/mL (ref <10)
Benzodiazepines: NEGATIVE ng/mL (ref <0.50)
Buprenorphine: NEGATIVE ng/mL (ref <0.10)
Carisoprodol: 59.2 ng/mL — ABNORMAL HIGH (ref <2.5)
Cocaine: NEGATIVE ng/mL (ref <5.0)
Codeine: 62.3 ng/mL — ABNORMAL HIGH (ref <2.5)
Dihydrocodeine: NEGATIVE ng/mL (ref <2.5)
Fentanyl: NEGATIVE ng/mL (ref <0.10)
Heroin Metabolite: NEGATIVE ng/mL (ref <1.0)
Hydrocodone: NEGATIVE ng/mL (ref <2.5)
Hydromorphone: NEGATIVE ng/mL (ref <2.5)
MARIJUANA: NEGATIVE ng/mL (ref <2.5)
MDMA: NEGATIVE ng/mL (ref <10)
Meprobamate: >250 ng/mL — ABNORMAL HIGH (ref <2.5)
Meprobamate: POSITIVE ng/mL — AB (ref <2.5)
Methadone: NEGATIVE ng/mL (ref <5.0)
Morphine: NEGATIVE ng/mL (ref <2.5)
Nicotine Metabolite: NEGATIVE ng/mL (ref <5.0)
Norhydrocodone: NEGATIVE ng/mL (ref <2.5)
Noroxycodone: 17.3 ng/mL — ABNORMAL HIGH (ref <2.5)
Opiates: POSITIVE ng/mL — AB (ref <2.5)
Oxycodone: 68.1 ng/mL — ABNORMAL HIGH (ref <2.5)
Oxymorphone: NEGATIVE ng/mL (ref <2.5)
Phencyclidine: NEGATIVE ng/mL (ref <10)
Tapentadol: NEGATIVE ng/mL (ref <5.0)
Tramadol: NEGATIVE ng/mL (ref <5.0)
Zolpidem: NEGATIVE ng/mL (ref <5.0)

## 2018-11-12 LAB — DRUG TOX ALC METAB W/CON, ORAL FLD: Alcohol Metabolite: NEGATIVE ng/mL (ref <25)

## 2018-11-17 ENCOUNTER — Telehealth: Payer: Self-pay | Admitting: *Deleted

## 2018-11-17 NOTE — Telephone Encounter (Signed)
Oral swab drug screen was consistent for prescribed medications. Kristen [name] has been taking cough syrup with codeine and we are aware.

## 2018-12-11 DIAGNOSIS — E1122 Type 2 diabetes mellitus with diabetic chronic kidney disease: Secondary | ICD-10-CM | POA: Diagnosis not present

## 2018-12-11 DIAGNOSIS — N183 Chronic kidney disease, stage 3 (moderate): Secondary | ICD-10-CM | POA: Diagnosis not present

## 2018-12-11 DIAGNOSIS — J45991 Cough variant asthma: Secondary | ICD-10-CM | POA: Diagnosis not present

## 2018-12-11 DIAGNOSIS — F324 Major depressive disorder, single episode, in partial remission: Secondary | ICD-10-CM | POA: Diagnosis not present

## 2018-12-11 DIAGNOSIS — I10 Essential (primary) hypertension: Secondary | ICD-10-CM | POA: Diagnosis not present

## 2018-12-11 DIAGNOSIS — E1165 Type 2 diabetes mellitus with hyperglycemia: Secondary | ICD-10-CM | POA: Diagnosis not present

## 2018-12-11 DIAGNOSIS — J45909 Unspecified asthma, uncomplicated: Secondary | ICD-10-CM | POA: Diagnosis not present

## 2018-12-28 DIAGNOSIS — E113293 Type 2 diabetes mellitus with mild nonproliferative diabetic retinopathy without macular edema, bilateral: Secondary | ICD-10-CM | POA: Diagnosis not present

## 2018-12-28 DIAGNOSIS — Z6841 Body Mass Index (BMI) 40.0 and over, adult: Secondary | ICD-10-CM | POA: Diagnosis not present

## 2018-12-28 DIAGNOSIS — E1122 Type 2 diabetes mellitus with diabetic chronic kidney disease: Secondary | ICD-10-CM | POA: Diagnosis not present

## 2018-12-28 DIAGNOSIS — Z794 Long term (current) use of insulin: Secondary | ICD-10-CM | POA: Diagnosis not present

## 2018-12-28 DIAGNOSIS — N183 Chronic kidney disease, stage 3 (moderate): Secondary | ICD-10-CM | POA: Diagnosis not present

## 2019-01-04 DIAGNOSIS — M1711 Unilateral primary osteoarthritis, right knee: Secondary | ICD-10-CM | POA: Diagnosis not present

## 2019-01-06 ENCOUNTER — Encounter: Payer: Self-pay | Admitting: Physical Medicine & Rehabilitation

## 2019-01-06 ENCOUNTER — Other Ambulatory Visit: Payer: Self-pay

## 2019-01-06 ENCOUNTER — Encounter: Payer: PPO | Attending: Physical Medicine & Rehabilitation | Admitting: Physical Medicine & Rehabilitation

## 2019-01-06 VITALS — BP 159/83 | HR 74 | Ht 67.0 in | Wt 387.0 lb

## 2019-01-06 DIAGNOSIS — Z794 Long term (current) use of insulin: Secondary | ICD-10-CM | POA: Insufficient documentation

## 2019-01-06 DIAGNOSIS — M545 Low back pain: Secondary | ICD-10-CM | POA: Insufficient documentation

## 2019-01-06 DIAGNOSIS — M7581 Other shoulder lesions, right shoulder: Secondary | ICD-10-CM

## 2019-01-06 DIAGNOSIS — E119 Type 2 diabetes mellitus without complications: Secondary | ICD-10-CM | POA: Insufficient documentation

## 2019-01-06 DIAGNOSIS — Z5181 Encounter for therapeutic drug level monitoring: Secondary | ICD-10-CM | POA: Insufficient documentation

## 2019-01-06 DIAGNOSIS — I10 Essential (primary) hypertension: Secondary | ICD-10-CM | POA: Diagnosis not present

## 2019-01-06 DIAGNOSIS — Z79899 Other long term (current) drug therapy: Secondary | ICD-10-CM | POA: Diagnosis not present

## 2019-01-06 DIAGNOSIS — M25562 Pain in left knee: Secondary | ICD-10-CM

## 2019-01-06 DIAGNOSIS — E669 Obesity, unspecified: Secondary | ICD-10-CM | POA: Insufficient documentation

## 2019-01-06 DIAGNOSIS — Z7982 Long term (current) use of aspirin: Secondary | ICD-10-CM | POA: Insufficient documentation

## 2019-01-06 DIAGNOSIS — M47816 Spondylosis without myelopathy or radiculopathy, lumbar region: Secondary | ICD-10-CM | POA: Diagnosis not present

## 2019-01-06 DIAGNOSIS — G8929 Other chronic pain: Secondary | ICD-10-CM | POA: Diagnosis not present

## 2019-01-06 DIAGNOSIS — M778 Other enthesopathies, not elsewhere classified: Secondary | ICD-10-CM

## 2019-01-06 MED ORDER — OXYCODONE HCL 10 MG PO TABS: 10.0000 mg | ORAL_TABLET | Freq: Three times a day (TID) | ORAL | 0 refills | Status: DC | PRN

## 2019-01-06 MED ORDER — CARISOPRODOL 350 MG PO TABS: 350.0000 mg | ORAL_TABLET | Freq: Three times a day (TID) | ORAL | 2 refills | Status: DC

## 2019-01-06 NOTE — Patient Instructions (Signed)
PLEASE FEEL FREE TO CALL OUR OFFICE WITH ANY PROBLEMS OR QUESTIONS ([phone])      

## 2019-01-06 NOTE — Progress Notes (Signed)
Subjective:    Patient ID: Kristen [name], female    DOB: Jan 05, 1966, 53 y.o.   MRN: [id number]  HPI   This is a follow-up visit for Mrs. Kristen [name] who I last saw in January.  She is here for her chronic low back pain.  She remains on oxycodone for pain.  She takes 10 mg every 8 hours as needed.  She also uses Soma for muscle spasms.  The medication allows her increased functionality at home.  She does some general stretching on a daily basis.  Since last month she has lost 4 friends and two pets as well which has raised her stress levels and has led to depression although she is trying to work through it.    About 3 weeks ago, when going out to get firewood, she stepped on an object on the ground in an odd way which caused her to twist her left knee and lose her balance. She had a steroid injection by ortho on Monday which has given her some relief. She was up on her legs more yesterday trying to find food for home, and her knee acted up somewhat.     Pain Inventory Average Pain 6 Pain Right Now 9 My pain is burning, dull and tingling  In the last 24 hours, has pain interfered with the following? General activity 7 Relation with others 7 Enjoyment of life 7 What TIME of day is your pain at its worst? all Sleep (in general) Poor  Pain is worse with: walking, bending, standing and some activites Pain improves with: medication Relief from Meds: 6  Mobility walk without assistance ability to climb steps?  no do you drive?  yes  Function I need assistance with the following:  meal prep and household duties  Neuro/Psych bladder control problems depression anxiety  Prior Studies Any changes since last visit?  no  Physicians involved in your care Any changes since last visit?  no   Family History  Problem Relation Age of Onset  . Hypertension Maternal Grandmother   . Stroke Maternal Grandmother   . Diabetes Maternal Grandfather   . Cancer Mother        lung   . Cancer Father   . Diabetes Sister   . Breast cancer Cousin   . Heart attack Neg Hx    Social History   Socioeconomic History  . Marital status: Single    Spouse name: Not on file  . Number of children: Not on file  . Years of education: Not on file  . Highest education level: Not on file  Occupational History  . Not on file  Social Needs  . Financial resource strain: Not on file  . Food insecurity:    Worry: Not on file    Inability: Not on file  . Transportation needs:    Medical: Not on file    Non-medical: Not on file  Tobacco Use  . Smoking status: Never Smoker  . Smokeless tobacco: Never Used  Substance and Sexual Activity  . Alcohol use: No    Alcohol/week: 0.0 standard drinks  . Drug use: No  . Sexual activity: Never    Comment: 1st intercourse 69 yo-1 partner  Lifestyle  . Physical activity:    Days per week: Not on file    Minutes per session: Not on file  . Stress: Not on file  Relationships  . Social connections:    Talks on phone: Not on file  Gets together: Not on file    Attends religious service: Not on file    Active member of club or organization: Not on file    Attends meetings of clubs or organizations: Not on file    Relationship status: Not on file  Other Topics Concern  . Not on file  Social History Narrative  . Not on file   Past Surgical History:  Procedure Laterality Date  . ABDOMINAL SURGERY     hernia repair  . CHOLECYSTECTOMY  [id number]  . COLONOSCOPY WITH PROPOFOL N/A 07/31/2016   Procedure: COLONOSCOPY WITH PROPOFOL;  Surgeon: Arta Silence, MD;  Location: [facility];  Service: Endoscopy;  Laterality: N/A;  . FOOT SURGERY Right 06/2007  . KNEE ARTHROSCOPY  09/2008   left  . KNEE ARTHROSCOPY  april 2011   left  . NASAL SINUS SURGERY    . NASAL SINUS SURGERY  1990  . SHOULDER SURGERY  2004   left   Past Medical History:  Diagnosis Date  . Anxiety   . Asthma   . 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 Depression    . Diabetes mellitus   . Hypertension   . Migraines   . Near syncope   . Obesity   . PCOS (polycystic ovarian syndrome)   . Peripheral edema   . Sleep apnea    uses cpap set on 10  . 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    BP (!) 159/83   Pulse 74   Ht 5\' 7"  (1.702 m)   Wt (!) 387 lb (175.5 kg)   SpO2 96%   BMI 60.61 kg/m   Opioid Risk Score:   Fall Risk Score:  `1  Depression screen PHQ 2/9  Depression screen [facility] 2/9 03/30/2018 02/20/2018 01/26/2018 12/05/2017 12/25/2016 07/01/2016  Decreased Interest 2 0 3 3 3 2   Down, Depressed, Hopeless 2 0 3 3 3 3   PHQ - 2 Score 4 0 6 6 6 5   Altered sleeping - - 0 - - 3  Tired, decreased energy - - 2 - - 3  Change in appetite - - 0 - - 1  Feeling bad or failure about yourself  - - 3 - - 3  Trouble concentrating - - 0 - - 1  Moving slowly or fidgety/restless - - 0 - - 2  Suicidal thoughts - - 0 - - 1  PHQ-9 Score - - 11 - - 19  Difficult doing work/chores - - Very difficult - - -    Review of Systems  Constitutional: Negative.   HENT: Negative.   Eyes: Negative.   Respiratory: Negative.   Cardiovascular: Negative.   Gastrointestinal: Positive for constipation and nausea.  Endocrine:       High blood sugar  Genitourinary: Negative.   Musculoskeletal: Positive for arthralgias, back pain and gait problem.  Skin: Negative.   Allergic/Immunologic: Negative.   Hematological: Negative.   Psychiatric/Behavioral: Positive for dysphoric mood. The patient is nervous/anxious.   All other systems reviewed and are negative.      Objective:   Physical Exam General: No acute distress, morbidly obese HEENT: EOMI, oral membranes moist Cards: reg rate  Chest: normal effort Abdomen: Soft, NT, ND Skin: dry, intact Extremities: no edema Musculoskeletal: LLEtr to 1+ edema Low back tender to palpation.  Left knee with mild crepitus and edema.  She is antalgic on the left side during ambulation today. Neuro: alertand oriented  to person,  place, and time.  Motor 5 out of 5.  Perhaps slight decrease sensation distally in the lower extremities .  Psychiatric: pleasant and appropriate .       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  h.  -We will continue the controlled substance monitoring program, this consists of regular clinic visits, examinations, routine drug screening, pill counts as well as use of [place] Controlled Substance Reporting System. [place].   -Medication was refilled and a second prescription was sent to the patient's pharmacy for next month.    2. Morbid obesity: Continue Healthy Diet Regime and HEP -this has been stressed.  3. Type II diabetes: Dr. Buddy Duty following -continue per endocrine fecs.  4. Reactive depression:  -continue onzoloft 200mg  daily  -seems to be coping well with loss of friend 5. Recent PE: on eliquis per primary -will need to stay on this for 12 months 6. Left knee pain, likely OA  -s/p injection by ortho  -weight loss  -knee strengthening exercises were provided  -?zilretta   15 minutesof face to face patient care time wasspent during this visit. All questions were encouraged and answered. Follow up with NP in 2 months. Marland Kitchen

## 2019-01-19 DIAGNOSIS — J45909 Unspecified asthma, uncomplicated: Secondary | ICD-10-CM | POA: Diagnosis not present

## 2019-01-19 DIAGNOSIS — E1165 Type 2 diabetes mellitus with hyperglycemia: Secondary | ICD-10-CM | POA: Diagnosis not present

## 2019-01-19 DIAGNOSIS — J45991 Cough variant asthma: Secondary | ICD-10-CM | POA: Diagnosis not present

## 2019-01-19 DIAGNOSIS — F324 Major depressive disorder, single episode, in partial remission: Secondary | ICD-10-CM | POA: Diagnosis not present

## 2019-01-19 DIAGNOSIS — I10 Essential (primary) hypertension: Secondary | ICD-10-CM | POA: Diagnosis not present

## 2019-01-19 DIAGNOSIS — N183 Chronic kidney disease, stage 3 (moderate): Secondary | ICD-10-CM | POA: Diagnosis not present

## 2019-01-19 DIAGNOSIS — E1122 Type 2 diabetes mellitus with diabetic chronic kidney disease: Secondary | ICD-10-CM | POA: Diagnosis not present

## 2019-02-11 DIAGNOSIS — R351 Nocturia: Secondary | ICD-10-CM | POA: Diagnosis not present

## 2019-02-11 DIAGNOSIS — R350 Frequency of micturition: Secondary | ICD-10-CM | POA: Diagnosis not present

## 2019-02-11 DIAGNOSIS — N3941 Urge incontinence: Secondary | ICD-10-CM | POA: Diagnosis not present

## 2019-02-11 DIAGNOSIS — R3915 Urgency of urination: Secondary | ICD-10-CM | POA: Diagnosis not present

## 2019-02-25 DIAGNOSIS — J45991 Cough variant asthma: Secondary | ICD-10-CM | POA: Diagnosis not present

## 2019-02-25 DIAGNOSIS — Z86711 Personal history of pulmonary embolism: Secondary | ICD-10-CM | POA: Diagnosis not present

## 2019-03-03 ENCOUNTER — Other Ambulatory Visit: Payer: PPO

## 2019-03-04 DIAGNOSIS — R350 Frequency of micturition: Secondary | ICD-10-CM | POA: Diagnosis not present

## 2019-03-04 DIAGNOSIS — R3915 Urgency of urination: Secondary | ICD-10-CM | POA: Diagnosis not present

## 2019-03-04 DIAGNOSIS — R351 Nocturia: Secondary | ICD-10-CM | POA: Diagnosis not present

## 2019-03-04 DIAGNOSIS — N3941 Urge incontinence: Secondary | ICD-10-CM | POA: Diagnosis not present

## 2019-03-05 ENCOUNTER — Other Ambulatory Visit: Payer: Self-pay | Admitting: Women's Health

## 2019-03-05 ENCOUNTER — Ambulatory Visit
Admission: RE | Admit: 2019-03-05 | Discharge: 2019-03-05 | Disposition: A | Discharge status: Home / Self Care | Payer: PPO | Source: Ambulatory Visit | Attending: Women's Health | Admitting: Women's Health

## 2019-03-05 ENCOUNTER — Other Ambulatory Visit: Payer: Self-pay

## 2019-03-05 DIAGNOSIS — N6310 Unspecified lump in the right breast, unspecified quadrant: Secondary | ICD-10-CM

## 2019-03-05 DIAGNOSIS — N6320 Unspecified lump in the left breast, unspecified quadrant: Secondary | ICD-10-CM

## 2019-03-05 DIAGNOSIS — N6002 Solitary cyst of left breast: Secondary | ICD-10-CM | POA: Diagnosis not present

## 2019-03-08 ENCOUNTER — Encounter: Payer: Self-pay | Admitting: Registered Nurse

## 2019-03-08 ENCOUNTER — Other Ambulatory Visit: Payer: Self-pay

## 2019-03-08 ENCOUNTER — Encounter: Payer: PPO | Attending: Physical Medicine & Rehabilitation | Admitting: Registered Nurse

## 2019-03-08 VITALS — BP 148/88 | HR 89 | Resp 14 | Ht 68.0 in | Wt 391.0 lb

## 2019-03-08 DIAGNOSIS — Z79899 Other long term (current) drug therapy: Secondary | ICD-10-CM | POA: Insufficient documentation

## 2019-03-08 DIAGNOSIS — M25562 Pain in left knee: Secondary | ICD-10-CM

## 2019-03-08 DIAGNOSIS — G894 Chronic pain syndrome: Secondary | ICD-10-CM

## 2019-03-08 DIAGNOSIS — Z794 Long term (current) use of insulin: Secondary | ICD-10-CM | POA: Insufficient documentation

## 2019-03-08 DIAGNOSIS — E119 Type 2 diabetes mellitus without complications: Secondary | ICD-10-CM | POA: Diagnosis not present

## 2019-03-08 DIAGNOSIS — G8929 Other chronic pain: Secondary | ICD-10-CM | POA: Insufficient documentation

## 2019-03-08 DIAGNOSIS — E669 Obesity, unspecified: Secondary | ICD-10-CM | POA: Insufficient documentation

## 2019-03-08 DIAGNOSIS — M545 Low back pain: Secondary | ICD-10-CM | POA: Diagnosis not present

## 2019-03-08 DIAGNOSIS — M47816 Spondylosis without myelopathy or radiculopathy, lumbar region: Secondary | ICD-10-CM

## 2019-03-08 DIAGNOSIS — I10 Essential (primary) hypertension: Secondary | ICD-10-CM | POA: Insufficient documentation

## 2019-03-08 DIAGNOSIS — Z7982 Long term (current) use of aspirin: Secondary | ICD-10-CM | POA: Diagnosis not present

## 2019-03-08 DIAGNOSIS — M7918 Myalgia, other site: Secondary | ICD-10-CM

## 2019-03-08 DIAGNOSIS — Z79891 Long term (current) use of opiate analgesic: Secondary | ICD-10-CM

## 2019-03-08 DIAGNOSIS — Z5181 Encounter for therapeutic drug level monitoring: Secondary | ICD-10-CM

## 2019-03-08 MED ORDER — OXYCODONE HCL 10 MG PO TABS: 10.0000 mg | ORAL_TABLET | Freq: Three times a day (TID) | ORAL | 0 refills | Status: DC | PRN

## 2019-03-08 NOTE — Progress Notes (Signed)
Subjective:    Patient ID: Kristen [name], female    DOB: 28-Dec-1965, 53 y.o.   MRN: [id number]  HPI: Kristen [name] is a 53 y.o. female who returns for follow up appointment for chronic pain and medication refill. She states her pain is located in her lower back and left knee. She rates her pain 7. Her current exercise regime is walking, performing light household chores  and performing stretching exercises.  Kristen [name] Morphine equivalent is 45.00MME.  Last Oral Swab was Performed on 11/09/2018, it was consistent.   Pain Inventory Average Pain 7 Pain Right Now 7 My pain is constant, sharp, stabbing and aching  In the last 24 hours, has pain interfered with the following? General activity 8 Relation with others 6 Enjoyment of life 8 What TIME of day is your pain at its worst? daytime, evening Sleep (in general) Poor  Pain is worse with: walking, sitting, standing and some activites Pain improves with: rest, heat/ice, pacing activities and medication Relief from Meds: 6  Mobility walk without assistance ability to climb steps?  no do you drive?  yes Do you have any goals in this area?  yes  Function disabled: date disabled . I need assistance with the following:  household duties and shopping Do you have any goals in this area?  yes  Neuro/Psych bladder control problems tingling depression anxiety  Prior Studies Any changes since last visit?  no  Physicians involved in your care Any changes since last visit?  no   Family History  Problem Relation Age of Onset  . Hypertension Maternal Grandmother   . Stroke Maternal Grandmother   . Diabetes Maternal Grandfather   . Cancer Mother        lung  . Cancer Father   . Diabetes Sister   . Breast cancer Cousin   . Heart attack Neg Hx    Social History   Socioeconomic History  . Marital status: Single    Spouse name: Not on file  . Number of children: Not on file  . Years of education: Not on file  .  Highest education level: Not on file  Occupational History  . Not on file  Social Needs  . Financial resource strain: Not on file  . Food insecurity:    Worry: Not on file    Inability: Not on file  . Transportation needs:    Medical: Not on file    Non-medical: Not on file  Tobacco Use  . Smoking status: Never Smoker  . Smokeless tobacco: Never Used  Substance and Sexual Activity  . Alcohol use: No    Alcohol/week: 0.0 standard drinks  . Drug use: No  . Sexual activity: Never    Comment: 1st intercourse 84 yo-1 partner  Lifestyle  . Physical activity:    Days per week: Not on file    Minutes per session: Not on file  . Stress: Not on file  Relationships  . Social connections:    Talks on phone: Not on file    Gets together: Not on file    Attends religious service: Not on file    Active member of club or organization: Not on file    Attends meetings of clubs or organizations: Not on file    Relationship status: Not on file  Other Topics Concern  . Not on file  Social History Narrative  . Not on file   Past Surgical History:  Procedure Laterality Date  . ABDOMINAL  SURGERY     hernia repair  . CHOLECYSTECTOMY  [id number]  . COLONOSCOPY WITH PROPOFOL N/A 07/31/2016   Procedure: COLONOSCOPY WITH PROPOFOL;  Surgeon: Arta Silence, MD;  Location: [facility];  Service: Endoscopy;  Laterality: N/A;  . FOOT SURGERY Right 06/2007  . KNEE ARTHROSCOPY  09/2008   left  . KNEE ARTHROSCOPY  april 2011   left  . NASAL SINUS SURGERY    . NASAL SINUS SURGERY  1990  . SHOULDER SURGERY  2004   left   Past Medical History:  Diagnosis Date  . Anxiety   . Asthma   . 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 Depression   . Diabetes mellitus   . Hypertension   . Migraines   . Near syncope   . Obesity   . PCOS (polycystic ovarian syndrome)   . Peripheral edema   . Sleep apnea    uses cpap set on 10  . Stroke ([place]) 12/2011   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    BP (!) 148/88    Pulse 89   Resp 14   Ht 5\' 8"  (1.727 m)   Wt (!) 391 lb (177.4 kg)   SpO2 94%   BMI 59.45 kg/m   Opioid Risk Score:   Fall Risk Score:  `1  Depression screen PHQ 2/9  Depression screen [facility] 2/9 03/30/2018 02/20/2018 01/26/2018 12/05/2017 12/25/2016 07/01/2016  Decreased Interest 2 0 3 3 3 2   Down, Depressed, Hopeless 2 0 3 3 3 3   PHQ - 2 Score 4 0 6 6 6 5   Altered sleeping - - 0 - - 3  Tired, decreased energy - - 2 - - 3  Change in appetite - - 0 - - 1  Feeling bad or failure about yourself  - - 3 - - 3  Trouble concentrating - - 0 - - 1  Moving slowly or fidgety/restless - - 0 - - 2  Suicidal thoughts - - 0 - - 1  PHQ-9 Score - - 11 - - 19  Difficult doing work/chores - - Very difficult - - -    Review of Systems  Constitutional: Negative.   HENT: Negative.   Eyes: Negative.   Respiratory: Negative.   Cardiovascular: Negative.   Gastrointestinal: Negative.   Endocrine: Negative.   Genitourinary: Positive for frequency and urgency.  Musculoskeletal: Positive for arthralgias and back pain.  Skin: Negative.   Allergic/Immunologic: Negative.   Neurological:       Tingling  Hematological: Negative.   Psychiatric/Behavioral: Positive for dysphoric mood. The patient is nervous/anxious.   All other systems reviewed and are negative.      Objective:   Physical Exam Vitals signs and nursing note reviewed.  Constitutional:      Appearance: Normal appearance.  Neck:     Musculoskeletal: Normal range of motion and neck supple.  Cardiovascular:     Rate and Rhythm: Normal rate and regular rhythm.  Pulmonary:     Effort: Pulmonary effort is normal.     Breath sounds: Normal breath sounds.  Musculoskeletal:     Comments: Normal Muscle Bulk and Muscle Testing Reveals:  Upper Extremities: Full ROM and Muscle Strength 5/5  Lumbar Hypersensitivity Lower Extremities: Full ROM and Muscle Strength 5/5  Left Lower Extremity Flexion Produces Pain into Left Patella Arises from Table  Slowly  Narrow Based  Gait   Skin:    General: Skin is warm and dry.  Neurological:  Mental Status: She is alert and oriented to person, place, and time.           Assessment & Plan:  1.Spondylosisof Lumbar Region/ Lumbar Facet arthropathy:03/08/2019. Refilled: Oxycodone 10 mg one tablet every 8 hours as needed #90. Second script e-scribefor the following month. We will continue the opioid monitoring program, this consists of regular clinic visits, examinations, urine drug screen, pill counts as well as use of [place] Controlled Substance Reporting System.  2. Morbid obesity: Continue Healthy Diet Regime and HEP.03/08/2019 3. Type II diabetes: Dr. Buddy Duty following.03/08/2019. 4. Reactive Depression: Continue Zoloft.03/08/2019. 5. Myofascial Muscle Pain: Continuecurrent medication regimen withSoma.03/08/2019 6.Left  Chronic Knee Pain: Continue HEP as Tolerated. Continue to Monitor.   20 minutes of face to face patient care time was spent during this visit. All questions were encouraged and answered.  F/U in 2 months

## 2019-03-09 ENCOUNTER — Telehealth: Payer: Self-pay | Admitting: Hematology and Oncology

## 2019-03-09 NOTE — Telephone Encounter (Signed)
Contacted patient re rescheduling 7/6 appointments per 5/14 schedule message. Per patient she will leave appointments as schedule.

## 2019-03-12 ENCOUNTER — Ambulatory Visit: Payer: PPO | Admitting: Registered Nurse

## 2019-03-16 ENCOUNTER — Encounter: Payer: PPO | Admitting: Women's Health

## 2019-03-19 ENCOUNTER — Other Ambulatory Visit: Payer: Self-pay

## 2019-03-22 ENCOUNTER — Encounter: Payer: PPO | Admitting: Women's Health

## 2019-03-23 DIAGNOSIS — R3915 Urgency of urination: Secondary | ICD-10-CM | POA: Diagnosis not present

## 2019-03-23 DIAGNOSIS — N3941 Urge incontinence: Secondary | ICD-10-CM | POA: Diagnosis not present

## 2019-03-31 DIAGNOSIS — M1712 Unilateral primary osteoarthritis, left knee: Secondary | ICD-10-CM | POA: Diagnosis not present

## 2019-04-22 ENCOUNTER — Other Ambulatory Visit: Payer: Self-pay | Admitting: Hematology and Oncology

## 2019-04-22 DIAGNOSIS — I2699 Other pulmonary embolism without acute cor pulmonale: Secondary | ICD-10-CM

## 2019-04-22 DIAGNOSIS — N183 Chronic kidney disease, stage 3 unspecified: Secondary | ICD-10-CM

## 2019-04-26 ENCOUNTER — Telehealth: Payer: Self-pay

## 2019-04-26 ENCOUNTER — Inpatient Hospital Stay: Payer: PPO | Attending: Hematology and Oncology

## 2019-04-26 ENCOUNTER — Other Ambulatory Visit: Payer: Self-pay

## 2019-04-26 ENCOUNTER — Inpatient Hospital Stay (HOSPITAL_BASED_OUTPATIENT_CLINIC_OR_DEPARTMENT_OTHER): Payer: PPO | Admitting: Hematology and Oncology

## 2019-04-26 DIAGNOSIS — N183 Chronic kidney disease, stage 3 unspecified: Secondary | ICD-10-CM

## 2019-04-26 DIAGNOSIS — H547 Unspecified visual loss: Secondary | ICD-10-CM

## 2019-04-26 DIAGNOSIS — Z7982 Long term (current) use of aspirin: Secondary | ICD-10-CM

## 2019-04-26 DIAGNOSIS — Z79899 Other long term (current) drug therapy: Secondary | ICD-10-CM | POA: Insufficient documentation

## 2019-04-26 DIAGNOSIS — M25569 Pain in unspecified knee: Secondary | ICD-10-CM | POA: Diagnosis not present

## 2019-04-26 DIAGNOSIS — G8929 Other chronic pain: Secondary | ICD-10-CM | POA: Diagnosis not present

## 2019-04-26 DIAGNOSIS — E1122 Type 2 diabetes mellitus with diabetic chronic kidney disease: Secondary | ICD-10-CM | POA: Diagnosis not present

## 2019-04-26 DIAGNOSIS — H538 Other visual disturbances: Secondary | ICD-10-CM | POA: Diagnosis not present

## 2019-04-26 DIAGNOSIS — E875 Hyperkalemia: Secondary | ICD-10-CM | POA: Insufficient documentation

## 2019-04-26 DIAGNOSIS — Z7901 Long term (current) use of anticoagulants: Secondary | ICD-10-CM | POA: Insufficient documentation

## 2019-04-26 DIAGNOSIS — Z86718 Personal history of other venous thrombosis and embolism: Secondary | ICD-10-CM

## 2019-04-26 DIAGNOSIS — I129 Hypertensive chronic kidney disease with stage 1 through stage 4 chronic kidney disease, or unspecified chronic kidney disease: Secondary | ICD-10-CM

## 2019-04-26 DIAGNOSIS — Z8673 Personal history of transient ischemic attack (TIA), and cerebral infarction without residual deficits: Secondary | ICD-10-CM | POA: Diagnosis not present

## 2019-04-26 DIAGNOSIS — Z86711 Personal history of pulmonary embolism: Secondary | ICD-10-CM | POA: Insufficient documentation

## 2019-04-26 DIAGNOSIS — I2699 Other pulmonary embolism without acute cor pulmonale: Secondary | ICD-10-CM

## 2019-04-26 DIAGNOSIS — Z794 Long term (current) use of insulin: Secondary | ICD-10-CM

## 2019-04-26 LAB — BASIC METABOLIC PANEL - CANCER CENTER ONLY
Anion gap: 8 (ref 5–15)
BUN: 17 mg/dL (ref 6–20)
CO2: 29 mmol/L (ref 22–32)
Calcium: 9.1 mg/dL (ref 8.9–10.3)
Chloride: 104 mmol/L (ref 98–111)
Creatinine: 1.14 mg/dL — ABNORMAL HIGH (ref 0.44–1.00)
GFR, Est AFR Am: >60 mL/min (ref >60)
GFR, Estimated: 55 mL/min — ABNORMAL LOW (ref >60)
Glucose, Bld: 161 mg/dL — ABNORMAL HIGH (ref 70–99)
Potassium: 5.6 mmol/L — ABNORMAL HIGH (ref 3.5–5.1)
Sodium: 141 mmol/L (ref 135–145)

## 2019-04-26 LAB — CBC WITH DIFFERENTIAL/PLATELET
Abs Immature Granulocytes: 0.03 10*3/uL (ref 0.00–0.07)
Basophils Absolute: 0 10*3/uL (ref 0.0–0.1)
Basophils Relative: 0 %
Eosinophils Absolute: 0.2 10*3/uL (ref 0.0–0.5)
Eosinophils Relative: 3 %
HCT: 41.3 % (ref 36.0–46.0)
Hemoglobin: 13.1 g/dL (ref 12.0–15.0)
Immature Granulocytes: 0 %
Lymphocytes Relative: 33 %
Lymphs Abs: 2.9 10*3/uL (ref 0.7–4.0)
MCH: 28.5 pg (ref 26.0–34.0)
MCHC: 31.7 g/dL (ref 30.0–36.0)
MCV: 89.8 fL (ref 80.0–100.0)
Monocytes Absolute: 0.7 10*3/uL (ref 0.1–1.0)
Monocytes Relative: 8 %
Neutro Abs: 5 10*3/uL (ref 1.7–7.7)
Neutrophils Relative %: 56 %
Platelets: 240 10*3/uL (ref 150–400)
RBC: 4.6 MIL/uL (ref 3.87–5.11)
RDW: 13.5 % (ref 11.5–15.5)
WBC: 8.9 10*3/uL (ref 4.0–10.5)
nRBC: 0 % (ref 0.0–0.2)

## 2019-04-26 NOTE — Telephone Encounter (Signed)
Called and gave below message to office staff. They will have Dr. Harrington Challenger call Dr. Alvy Bimler.

## 2019-04-26 NOTE — Telephone Encounter (Signed)
-----   Message from Heath Lark, MD sent at 04/26/2019  1:19 PM EDT ----- Regarding: Dr. Harrington Challenger Can you help me call her PCP Dr. Harrington Challenger? Would like to discuss test results with him: high potassium

## 2019-04-27 ENCOUNTER — Encounter: Payer: Self-pay | Admitting: Hematology and Oncology

## 2019-04-27 DIAGNOSIS — H547 Unspecified visual loss: Secondary | ICD-10-CM | POA: Insufficient documentation

## 2019-04-27 DIAGNOSIS — E875 Hyperkalemia: Secondary | ICD-10-CM | POA: Diagnosis not present

## 2019-04-27 NOTE — Assessment & Plan Note (Signed)
She is doing well without any complications from excessive bleeding from anticoagulation treatment Due to her poor mobility and high risk of recurrence of DVT, I recommend extended duration Eliquis long-term/indefinite treatment as secondary prevention I warned her about risk of bleeding with combination treatment along with antiplatelet agents  I recommend follow-up with primary care doctor only to monitor for signs of bleeding and renal function At this point in time, there is no need for her to come back to see me unless perioperative anticoagulation management is needed I have discussed this with her primary care doctor

## 2019-04-27 NOTE — Progress Notes (Signed)
[place] OFFICE PROGRESS NOTE  Lawerance Cruel, MD  ASSESSMENT & PLAN:  Pulmonary embolism [facility]) She is doing well without any complications from excessive bleeding from anticoagulation treatment Due to her poor mobility and high risk of recurrence of DVT, I recommend extended duration Eliquis long-term/indefinite treatment as secondary prevention I warned her about risk of bleeding with combination treatment along with antiplatelet agents  I recommend follow-up with primary care doctor only to monitor for signs of bleeding and renal function At this point in time, there is no need for her to come back to see me unless perioperative anticoagulation management is needed I have discussed this with her primary care doctor  CKD (chronic kidney disease), stage III ([place]) She has mild chronic kidney disease stage III due to diabetes and other vascular complication We will monitor her blood counts carefully while on Eliquis   Hyperkalemia She has hyperkalemia, could be secondary to lisinopril I have discussed this with her primary care doctor to monitor closely and adjust medications as needed  Poor vision She has poor vision and is being followed closely by ophthalmologist I will defer to her eye doctor for further management   No orders of the defined types were placed in this encounter.   INTERVAL HISTORY: Kristen [name] 53 y.o. female returns for further follow-up. She is doing well with anticoagulation therapy without bleeding She has been seen by neurologist and ophthalmologist for poor vision Her mobility is poor due to her morbid obesity and chronic knee pain She follows closely with her primary care doctor for medical management of other medical issues such as diabetes The patient denies any recent signs or symptoms of bleeding such as spontaneous epistaxis, hematuria or hematochezia.   SUMMARY OF HEMATOLOGIC HISTORY:  Please see my detailed consult  note dated 03/23/2017 for further details Kristen [name] was seen at the request by hospitalist due to DVT and PE and abnormal lymphocytosis This patient is morbidly obese with significant cardiovascular risk factors including poorly controlled diabetes, hypertension, prior history of stroke and others. According to the patient, she had accidental fall at home a month ago but did not sustain major injury The patient also had poorly controlled asthma and was started on steroid injection along with oral corticosteroids. That in turn cause significant urinary frequency presumably due to severe uncontrolled hyperglycemia at home. She was also started on diuretic for fluid retention and admits she is drinking less liquid and possibly dehydrated prior to admission. Her mobility is poor due to recent fall and asthma. Subsequently, she started to feel progressive shortness of breath and chest pressure. She was directed to the emergency department and underwent ultrasound venous Doppler along with CT imaging which show significant changes suggestive of massive PE. She has chronic leg swelling and recurrent cellulitis with abnormal skin changes on the legs She had no prior history or diagnosis of cancer. Her age appropriate screening programs are up-to-date. She had prior surgeries before and never had perioperative thromboembolic events. The patient had never been pregnant before There is no family history of blood clots or miscarriages. The patient was subsequently discharged from the hospital on Eliquis along with 81 mg aspirin  I have reviewed the past medical history, past surgical history, social history and family history with the patient and they are unchanged from previous note.  ALLERGIES:  is allergic to cafergot; glucophage [metformin hcl]; and canagliflozin.  MEDICATIONS:  Current Outpatient Medications  Medication Sig Dispense Refill  .  ACCU-CHEK AVIVA PLUS test strip USE AS DIRECTED TWICE A DAY  IN VITRO  12  . albuterol (ACCUNEB) 1.25 MG/3ML nebulizer solution Inhale 1 ampule into the lungs 3 (three) times daily as needed for wheezing or shortness of breath.     Marland Kitchen albuterol (VENTOLIN HFA) 108 (90 Base) MCG/ACT inhaler Inhale 2 puffs into the lungs every 4 (four) hours as needed for wheezing or shortness of breath.     Marland Kitchen apixaban (ELIQUIS) 5 MG TABS tablet Take two tabs twice a day for 7 days, then take one tab twice a day for a year. 74 tablet 0  . aspirin 81 MG tablet Take 81 mg by mouth daily.    Marland Kitchen atorvastatin (LIPITOR) 40 MG tablet Take 40 mg by mouth daily.    . BD PEN NEEDLE NANO U/F 32G X 4 MM MISC 2 (two) times daily. as directed  11  . carisoprodol (SOMA) 350 MG tablet Take 1 tablet (350 mg total) by mouth 3 (three) times daily. 90 tablet 2  . Cholecalciferol (VITAMIN D3) 2000 units capsule Take 2,000 Units by mouth daily.     . Fluticasone-Salmeterol (ADVAIR) 500-50 MCG/DOSE AEPB Inhale 1 puff into the lungs every 12 (twelve) hours.    . insulin aspart (NOVOLOG FLEXPEN) 100 UNIT/ML FlexPen Inject 15 Units into the skin 3 (three) times daily with meals. 15 mL 11  . insulin glargine (LANTUS) 100 UNIT/ML injection Inject 80-100 Units into the skin at bedtime.     Marland Kitchen lisinopril (PRINIVIL,ZESTRIL) 20 MG tablet Take 1 tablet (20 mg total) by mouth daily. 30 tablet 0  . montelukast (SINGULAIR) 10 MG tablet Take 10 mg by mouth at bedtime.     Marland Kitchen nystatin cream (MYCOSTATIN) Apply 1 application topically 2 (two) times daily. 30 g 6  . oxybutynin (DITROPAN) 5 MG tablet TAKE 1 TABLET BY MOUTH TWICE A DAY AS NEEDED FOR BLADDER  3  . Oxycodone HCl 10 MG TABS Take 1 tablet (10 mg total) by mouth every 8 (eight) hours as needed. 90 tablet 0  . promethazine (PHENERGAN) 25 MG tablet Take 25 mg by mouth every 6 (six) hours as needed.     . senna-docusate (SENOKOT-S) 8.6-50 MG tablet Take 1 tablet by mouth at bedtime. 30 tablet 0  . sertraline (ZOLOFT) 100 MG tablet Take 200 mg by mouth daily.      Marland Kitchen torsemide (DEMADEX) 20 MG tablet Take 20 mg by mouth daily.    Marland Kitchen VICTOZA 18 MG/3ML SOPN INJECT 1.8ML SUBCUTANEOUSLY ONCE DAILY FOR 30 DAYS  11   No current facility-administered medications for this visit.      REVIEW OF SYSTEMS:   Constitutional: Denies fevers, chills or night sweats Ears, nose, mouth, throat, and face: Denies mucositis or sore throat Respiratory: Denies cough, dyspnea or wheezes Cardiovascular: Denies palpitation, chest discomfort  Gastrointestinal:  Denies nausea, heartburn or change in bowel habits Skin: Denies abnormal skin rashes Lymphatics: Denies new lymphadenopathy or easy bruising Neurological:Denies numbness, tingling or new weaknesses Behavioral/Psych: Mood is stable, no new changes  All other systems were reviewed with the patient and are negative.  PHYSICAL EXAMINATION: ECOG PERFORMANCE STATUS: 2 - Symptomatic, <50% confined to bed  Vitals:   04/26/19 1228  BP: (!) 131/95  Pulse: 98  Resp: 20  Temp: 98.2 F (36.8 C)  SpO2: 98%   Filed Weights   04/26/19 1228  Weight: (!) 378 lb 12.8 oz (171.8 kg)    GENERAL:alert, no distress and comfortable Musculoskeletal:no cyanosis  of digits and no clubbing  NEURO: alert & oriented x 3 with fluent speech, no focal motor/sensory deficits  LABORATORY DATA:  I have reviewed the data as listed     Component Value Date/Time   NA 141 04/26/2019 1205   NA 138 04/14/2017 1348   K 5.6 (H) 04/26/2019 1205   K 5.1 04/14/2017 1348   CL 104 04/26/2019 1205   CO2 29 04/26/2019 1205   CO2 27 04/14/2017 1348   GLUCOSE 161 (H) 04/26/2019 1205   GLUCOSE 257 (H) 04/14/2017 1348   BUN 17 04/26/2019 1205   BUN 16.3 04/14/2017 1348   CREATININE 1.14 (H) 04/26/2019 1205   CREATININE 1.2 (H) 04/14/2017 1348   CALCIUM 9.1 04/26/2019 1205   CALCIUM 9.8 04/14/2017 1348   PROT 7.4 04/20/2018 1217   PROT 7.1 04/14/2017 1348   ALBUMIN 3.8 04/20/2018 1217   ALBUMIN 3.4 (L) 04/14/2017 1348   AST 18 04/20/2018 1217    AST 24 04/14/2017 1348   ALT 23 04/20/2018 1217   ALT 24 04/14/2017 1348   ALKPHOS 75 04/20/2018 1217   ALKPHOS 75 04/14/2017 1348   BILITOT 0.4 04/20/2018 1217   BILITOT 0.40 04/14/2017 1348   GFRNONAA 55 (L) 04/26/2019 1205   GFRAA >60 04/26/2019 1205    No results found for: SPEP, UPEP  Lab Results  Component Value Date   WBC 8.9 04/26/2019   NEUTROABS 5.0 04/26/2019   HGB 13.1 04/26/2019   HCT 41.3 04/26/2019   MCV 89.8 04/26/2019   PLT 240 04/26/2019      Chemistry      Component Value Date/Time   NA 141 04/26/2019 1205   NA 138 04/14/2017 1348   K 5.6 (H) 04/26/2019 1205   K 5.1 04/14/2017 1348   CL 104 04/26/2019 1205   CO2 29 04/26/2019 1205   CO2 27 04/14/2017 1348   BUN 17 04/26/2019 1205   BUN 16.3 04/14/2017 1348   CREATININE 1.14 (H) 04/26/2019 1205   CREATININE 1.2 (H) 04/14/2017 1348      Component Value Date/Time   CALCIUM 9.1 04/26/2019 1205   CALCIUM 9.8 04/14/2017 1348   ALKPHOS 75 04/20/2018 1217   ALKPHOS 75 04/14/2017 1348   AST 18 04/20/2018 1217   AST 24 04/14/2017 1348   ALT 23 04/20/2018 1217   ALT 24 04/14/2017 1348   BILITOT 0.4 04/20/2018 1217   BILITOT 0.40 04/14/2017 1348       I spent 15 minutes counseling the patient face to face. The total time spent in the appointment was 20 minutes and more than 50% was on counseling.   All questions were answered. The patient knows to call the clinic with any problems, questions or concerns. No barriers to learning was detected.    Heath Lark, MD 7/7/20207:28 AM

## 2019-04-27 NOTE — Assessment & Plan Note (Signed)
She has hyperkalemia, could be secondary to lisinopril I have discussed this with her primary care doctor to monitor closely and adjust medications as needed

## 2019-04-27 NOTE — Assessment & Plan Note (Signed)
She has mild chronic kidney disease stage III due to diabetes and other vascular complication We will monitor her blood counts carefully while on Eliquis

## 2019-04-27 NOTE — Assessment & Plan Note (Signed)
She has poor vision and is being followed closely by ophthalmologist I will defer to her eye doctor for further management

## 2019-05-03 ENCOUNTER — Ambulatory Visit (INDEPENDENT_AMBULATORY_CARE_PROVIDER_SITE_OTHER): Payer: PPO | Admitting: Women's Health

## 2019-05-03 ENCOUNTER — Encounter: Payer: Self-pay | Admitting: Women's Health

## 2019-05-03 VITALS — BP 136/84 | Ht 68.0 in | Wt 393.0 lb

## 2019-05-03 DIAGNOSIS — Z01419 Encounter for gynecological examination (general) (routine) without abnormal findings: Secondary | ICD-10-CM
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
## 2019-05-03 NOTE — Patient Instructions (Signed)
Carbohydrate Counting for Diabetes Mellitus, Adult  Carbohydrate counting is a method of keeping track of how many carbohydrates you eat. Eating carbohydrates naturally increases the amount of sugar (glucose) in the blood. Counting how many carbohydrates you eat helps keep your blood glucose within normal limits, which helps you manage your diabetes (diabetes mellitus). It is important to know how many carbohydrates you can safely have in each meal. This is different for every person. A diet and nutrition specialist (registered dietitian) can help you make a meal plan and calculate how many carbohydrates you should have at each meal and snack. Carbohydrates are found in the following foods:  Grains, such as breads and cereals.  Dried beans and soy products.  Starchy vegetables, such as potatoes, peas, and corn.  Fruit and fruit juices.  Milk and yogurt.  Sweets and snack foods, such as cake, cookies, candy, chips, and soft drinks. How do I count carbohydrates? There are two ways to count carbohydrates in food. You can use either of the methods or a combination of both. Reading "Nutrition Facts" on packaged food The "Nutrition Facts" list is included on the labels of almost all packaged foods and beverages in the [place] It includes:  The serving size.  Information about nutrients in each serving, including the grams (g) of carbohydrate per serving. To use the "Nutrition Facts":  Decide how many servings you will have.  Multiply the number of servings by the number of carbohydrates per serving.  The resulting number is the total amount of carbohydrates that you will be having. Learning standard serving sizes of other foods When you eat carbohydrate foods that are not packaged or do not include "Nutrition Facts" on the label, you need to measure the servings in order to count the amount of carbohydrates:  Measure the foods that you will eat with a food scale or measuring cup, if needed.   Decide how many standard-size servings you will eat.  Multiply the number of servings by 15. Most carbohydrate-rich foods have about 15 g of carbohydrates per serving. ? For example, if you eat 8 oz (170 g) of strawberries, you will have eaten 2 servings and 30 g of carbohydrates (2 servings x 15 g = 30 g).  For foods that have more than one food mixed, such as soups and casseroles, you must count the carbohydrates in each food that is included. The following list contains standard serving sizes of common carbohydrate-rich foods. Each of these servings has about 15 g of carbohydrates:   hamburger bun or  English muffin.   oz (15 mL) syrup.   oz (14 g) jelly.  1 slice of bread.  1 six-inch tortilla.  3 oz (85 g) cooked rice or pasta.  4 oz (113 g) cooked dried beans.  4 oz (113 g) starchy vegetable, such as peas, corn, or potatoes.  4 oz (113 g) hot cereal.  4 oz (113 g) mashed potatoes or  of a large baked potato.  4 oz (113 g) canned or frozen fruit.  4 oz (120 mL) fruit juice.  4-6 crackers.  6 chicken nuggets.  6 oz (170 g) unsweetened dry cereal.  6 oz (170 g) plain fat-free yogurt or yogurt sweetened with artificial sweeteners.  8 oz (240 mL) milk.  8 oz (170 g) fresh fruit or one small piece of fruit.  24 oz (680 g) popped popcorn. Example of carbohydrate counting Sample meal  3 oz (85 g) chicken breast.  6 oz (170 g)   brown rice.  4 oz (113 g) corn.  8 oz (240 mL) milk.  8 oz (170 g) strawberries with sugar-free whipped topping. Carbohydrate calculation 1. Identify the foods that contain carbohydrates: ? Rice. ? Corn. ? Milk. ? Strawberries. 2. Calculate how many servings you have of each food: ? 2 servings rice. ? 1 serving corn. ? 1 serving milk. ? 1 serving strawberries. 3. Multiply each number of servings by 15 g: ? 2 servings rice x 15 g = 30 g. ? 1 serving corn x 15 g = 15 g. ? 1 serving milk x 15 g = 15 g. ? 1 serving  strawberries x 15 g = 15 g. 4. Add together all of the amounts to find the total grams of carbohydrates eaten: ? 30 g + 15 g + 15 g + 15 g = 75 g of carbohydrates total. Summary  Carbohydrate counting is a method of keeping track of how many carbohydrates you eat.  Eating carbohydrates naturally increases the amount of sugar (glucose) in the blood.  Counting how many carbohydrates you eat helps keep your blood glucose within normal limits, which helps you manage your diabetes.  A diet and nutrition specialist (registered dietitian) can help you make a meal plan and calculate how many carbohydrates you should have at each meal and snack. This information is not intended to replace advice given to you by your health care provider. Make sure you discuss any questions you have with your health care provider. Document Released: 10/07/2005 Document Revised: 05/01/2017 Document Reviewed: 03/20/2016 Elsevier Patient Education  2020 [place] Maintenance, Female Adopting a healthy lifestyle and getting preventive care are important in promoting health and wellness. Ask your health care provider about:  The right schedule for you to have regular tests and exams.  Things you can do on your own to prevent diseases and keep yourself healthy. What should I know about diet, weight, and exercise? Eat a healthy diet   Eat a diet that includes plenty of vegetables, fruits, low-fat dairy products, and lean protein.  Do not eat a lot of foods that are high in solid fats, added sugars, or sodium. Maintain a healthy weight Body mass index (BMI) is used to identify weight problems. It estimates body fat based on height and weight. Your health care provider can help determine your BMI and help you achieve or maintain a healthy weight. Get regular exercise Get regular exercise. This is one of the most important things you can do for your health. Most adults should:  Exercise for at least 150  minutes each week. The exercise should increase your heart rate and make you sweat (moderate-intensity exercise).  Do strengthening exercises at least twice a week. This is in addition to the moderate-intensity exercise.  Spend less time sitting. Even light physical activity can be beneficial. Watch cholesterol and blood lipids Have your blood tested for lipids and cholesterol at 53 years of age, then have this test every 5 years. Have your cholesterol levels checked more often if:  Your lipid or cholesterol levels are high.  You are older than 53 years of age.  You are at high risk for heart disease. What should I know about cancer screening? Depending on your health history and family history, you may need to have cancer screening at various ages. This may include screening for:  Breast cancer.  Cervical cancer.  Colorectal cancer.  Skin cancer.  Lung cancer. What should I know about heart disease, diabetes,  and high blood pressure? Blood pressure and heart disease  High blood pressure causes heart disease and increases the risk of stroke. This is more likely to develop in people who have high blood pressure readings, are of African descent, or are overweight.  Have your blood pressure checked: ? Every 3-5 years if you are 86-64 years of age. ? Every year if you are 53 years old or older. Diabetes Have regular diabetes screenings. This checks your fasting blood sugar level. Have the screening done:  Once every three years after age 5 if you are at a normal weight and have a low risk for diabetes.  More often and at a younger age if you are overweight or have a high risk for diabetes. What should I know about preventing infection? Hepatitis B If you have a higher risk for hepatitis B, you should be screened for this virus. Talk with your health care provider to find out if you are at risk for hepatitis B infection. Hepatitis C Testing is recommended for:  Everyone born  from [birth year] through 1965.  Anyone with known risk factors for hepatitis C. Sexually transmitted infections (STIs)  Get screened for STIs, including gonorrhea and chlamydia, if: ? You are sexually active and are younger than 53 years of age. ? You are older than 53 years of age and your health care provider tells you that you are at risk for this type of infection. ? Your sexual activity has changed since you were last screened, and you are at increased risk for chlamydia or gonorrhea. Ask your health care provider if you are at risk.  Ask your health care provider about whether you are at high risk for HIV. Your health care provider may recommend a prescription medicine to help prevent HIV infection. If you choose to take medicine to prevent HIV, you should first get tested for HIV. You should then be tested every 3 months for as long as you are taking the medicine. Pregnancy  If you are about to stop having your period (premenopausal) and you may become pregnant, seek counseling before you get pregnant.  Take 400 to 800 micrograms (mcg) of folic acid every day if you become pregnant.  Ask for birth control (contraception) if you want to prevent pregnancy. Osteoporosis and menopause Osteoporosis is a disease in which the bones lose minerals and strength with aging. This can result in bone fractures. If you are 73 years old or older, or if you are at risk for osteoporosis and fractures, ask your health care provider if you should:  Be screened for bone loss.  Take a calcium or vitamin D supplement to lower your risk of fractures.  Be given hormone replacement therapy (HRT) to treat symptoms of menopause. Follow these instructions at home: Lifestyle  Do not use any products that contain nicotine or tobacco, such as cigarettes, e-cigarettes, and chewing tobacco. If you need help quitting, ask your health care provider.  Do not use street drugs.  Do not share needles.  Ask your health  care provider for help if you need support or information about quitting drugs. Alcohol use  Do not drink alcohol if: ? Your health care provider tells you not to drink. ? You are pregnant, may be pregnant, or are planning to become pregnant.  If you drink alcohol: ? Limit how much you use to 0-1 drink a day. ? Limit intake if you are breastfeeding.  Be aware of how much alcohol is in your drink.  In the [place] one drink equals one 12 oz bottle of beer (355 mL), one 5 oz glass of wine (148 mL), or one 1 oz glass of hard liquor (44 mL). General instructions  Schedule regular health, dental, and eye exams.  Stay current with your vaccines.  Tell your health care provider if: ? You often feel depressed. ? You have ever been abused or do not feel safe at home. Summary  Adopting a healthy lifestyle and getting preventive care are important in promoting health and wellness.  Follow your health care provider's instructions about healthy diet, exercising, and getting tested or screened for diseases.  Follow your health care provider's instructions on monitoring your cholesterol and blood pressure. This information is not intended to replace advice given to you by your health care provider. Make sure you discuss any questions you have with your health care provider. Document Released: 04/22/2011 Document Revised: 09/30/2018 Document Reviewed: 09/30/2018 Elsevier Patient Education  2020 [employer].

## 2019-05-04 ENCOUNTER — Encounter: Payer: Self-pay | Admitting: Registered Nurse

## 2019-05-04 ENCOUNTER — Other Ambulatory Visit: Payer: Self-pay

## 2019-05-04 ENCOUNTER — Encounter: Payer: PPO | Attending: Physical Medicine & Rehabilitation | Admitting: Registered Nurse

## 2019-05-04 VITALS — BP 160/94 | HR 86 | Temp 97.6°F | Ht 68.0 in | Wt 392.0 lb

## 2019-05-04 DIAGNOSIS — Z7982 Long term (current) use of aspirin: Secondary | ICD-10-CM | POA: Diagnosis not present

## 2019-05-04 DIAGNOSIS — E669 Obesity, unspecified: Secondary | ICD-10-CM | POA: Diagnosis not present

## 2019-05-04 DIAGNOSIS — Z79891 Long term (current) use of opiate analgesic: Secondary | ICD-10-CM

## 2019-05-04 DIAGNOSIS — Z79899 Other long term (current) drug therapy: Secondary | ICD-10-CM | POA: Insufficient documentation

## 2019-05-04 DIAGNOSIS — Z5181 Encounter for therapeutic drug level monitoring: Secondary | ICD-10-CM | POA: Diagnosis not present

## 2019-05-04 DIAGNOSIS — M545 Low back pain: Secondary | ICD-10-CM | POA: Insufficient documentation

## 2019-05-04 DIAGNOSIS — Z794 Long term (current) use of insulin: Secondary | ICD-10-CM | POA: Diagnosis not present

## 2019-05-04 DIAGNOSIS — E119 Type 2 diabetes mellitus without complications: Secondary | ICD-10-CM | POA: Diagnosis not present

## 2019-05-04 DIAGNOSIS — M47816 Spondylosis without myelopathy or radiculopathy, lumbar region: Secondary | ICD-10-CM | POA: Diagnosis not present

## 2019-05-04 DIAGNOSIS — M25562 Pain in left knee: Secondary | ICD-10-CM | POA: Diagnosis not present

## 2019-05-04 DIAGNOSIS — I10 Essential (primary) hypertension: Secondary | ICD-10-CM | POA: Diagnosis not present

## 2019-05-04 DIAGNOSIS — G8929 Other chronic pain: Secondary | ICD-10-CM | POA: Insufficient documentation

## 2019-05-04 DIAGNOSIS — M7918 Myalgia, other site: Secondary | ICD-10-CM | POA: Diagnosis not present

## 2019-05-04 DIAGNOSIS — G894 Chronic pain syndrome: Secondary | ICD-10-CM

## 2019-05-04 MED ORDER — CARISOPRODOL 350 MG PO TABS: 350.0000 mg | ORAL_TABLET | Freq: Three times a day (TID) | ORAL | 2 refills | Status: DC

## 2019-05-04 MED ORDER — OXYCODONE HCL 10 MG PO TABS: 10.0000 mg | ORAL_TABLET | Freq: Three times a day (TID) | ORAL | 0 refills | Status: DC | PRN

## 2019-05-04 NOTE — Progress Notes (Signed)
Subjective:    Patient ID: Kristen [name], female    DOB: July 31, 1966, 53 y.o.   MRN: [id number]  HPI: Kristen [name] is a 53 y.o. female who returns for follow up appointment for chronic pain and medication refill. She states her pain is located in her lower back mainly right side and left knee pain. She rates her  Pain 7. Her  current exercise regime is walking.  Ms. Raulerson Morphine equivalent is 45.00 MME. Last Oral Swab was Performed on 11/09/2018, it was consistent.   Pain Inventory Average Pain 7 Pain Right Now 7 My pain is constant, sharp and aching  In the last 24 hours, has pain interfered with the following? General activity 8 Relation with others 7 Enjoyment of life 8 What TIME of day is your pain at its worst? morning, evening Sleep (in general) Fair  Pain is worse with: walking, standing and some activites Pain improves with: rest, heat/ice and medication Relief from Meds: 7  Mobility ability to climb steps?  no do you drive?  yes  Function disabled: date disabled 2013 I need assistance with the following:  meal prep, household duties and shopping  Neuro/Psych bladder control problems depression anxiety  Prior Studies no  Physicians involved in your care no   Family History  Problem Relation Age of Onset  . Hypertension Maternal Grandmother   . Stroke Maternal Grandmother   . Diabetes Maternal Grandfather   . Cancer Mother        lung  . Cancer Father   . Diabetes Sister   . Breast cancer Cousin   . Heart attack Neg Hx    Social History   Socioeconomic History  . Marital status: Single    Spouse name: Not on file  . Number of children: Not on file  . Years of education: Not on file  . Highest education level: Not on file  Occupational History  . Not on file  Social Needs  . Financial resource strain: Not on file  . Food insecurity    Worry: Not on file    Inability: Not on file  . Transportation needs    Medical: Not on file     Non-medical: Not on file  Tobacco Use  . Smoking status: Never Smoker  . Smokeless tobacco: Never Used  Substance and Sexual Activity  . Alcohol use: No    Alcohol/week: 0.0 standard drinks  . Drug use: No  . Sexual activity: Not Currently    Comment: 1st intercourse 35 yo-1 partner  Lifestyle  . Physical activity    Days per week: Not on file    Minutes per session: Not on file  . Stress: Not on file  Relationships  . Social Herbalist on phone: Not on file    Gets together: Not on file    Attends religious service: Not on file    Active member of club or organization: Not on file    Attends meetings of clubs or organizations: Not on file    Relationship status: Not on file  Other Topics Concern  . Not on file  Social History Narrative  . Not on file   Past Surgical History:  Procedure Laterality Date  . ABDOMINAL SURGERY     hernia repair  . CHOLECYSTECTOMY  [id number]  . COLONOSCOPY WITH PROPOFOL N/A 07/31/2016   Procedure: COLONOSCOPY WITH PROPOFOL;  Surgeon: Arta Silence, MD;  Location: [facility];  Service: Endoscopy;  Laterality: N/A;  .  FOOT SURGERY Right 06/2007  . KNEE ARTHROSCOPY  09/2008   left  . KNEE ARTHROSCOPY  april 2011   left  . NASAL SINUS SURGERY    . NASAL SINUS SURGERY  1990  . SHOULDER SURGERY  2004   left   Past Medical History:  Diagnosis Date  . Anxiety   . Asthma   . 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 Depression   . Diabetes mellitus   . Hypertension   . Migraines   . Near syncope   . Obesity   . PCOS (polycystic ovarian syndrome)   . Peripheral edema   . Sleep apnea    uses cpap set on 10  . 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  Opioid Risk Score:   Fall Risk Score:  `1  Depression screen PHQ 2/9  Depression screen [name] 2/9 03/30/2018 02/20/2018 01/26/2018 12/05/2017 12/25/2016 07/01/2016  Decreased Interest 2 0 3 3 3 2   Down, Depressed, Hopeless 2  0 3 3 3 3   PHQ - 2 Score 4 0 6 6 6 5   Altered sleeping - - 0 - - 3  Tired, decreased energy - - 2 - - 3  Change in appetite - - 0 - - 1  Feeling bad or failure about yourself  - - 3 - - 3  Trouble concentrating - - 0 - - 1  Moving slowly or fidgety/restless - - 0 - - 2  Suicidal thoughts - - 0 - - 1  PHQ-9 Score - - 11 - - 19  Difficult doing work/chores - - Very difficult - - -      Review of Systems  Constitutional:       High blood sugar  All other systems reviewed and are negative.      Objective:   Physical Exam Vitals signs and nursing note reviewed.  Constitutional:      Appearance: Normal appearance. She is obese.  Neck:     Musculoskeletal: Normal range of motion and neck supple.  Cardiovascular:     Rate and Rhythm: Normal rate and regular rhythm.     Pulses: Normal pulses.     Heart sounds: Normal heart sounds.  Pulmonary:     Effort: Pulmonary effort is normal.     Breath sounds: Normal breath sounds.  Musculoskeletal:     Comments: Normal Muscle Bulk and Muscle Testing Reveals:  Upper Extremities: Full ROM and Muscle Strength 5/5  Lumbar Paraspinal Tenderness: L-2-L-4 Mainly Right Side Lower Extremities: Full ROM and Muscle Strength 5/5 Arises from Table with ease Narrow Based  Gait   Skin:    General: Skin is warm and dry.  Neurological:     Mental Status: She is alert and oriented to person, place, and time.  Psychiatric:        Mood and Affect: Mood normal.        Behavior: Behavior normal.           Assessment & Plan:  1.Spondylosisof Lumbar Region/ Lumbar Facet arthropathy:05/04/2019. Refilled: Oxycodone 10 mg one tablet every 8 hours as needed #90. Second script e-scribefor the following month. We will continue the opioid monitoring program, this consists of regular clinic visits, examinations, urine drug screen, pill counts as well as use of [place] Controlled Substance Reporting System.  2. Morbid obesity: Continue Healthy  Diet Regime and HEP.05/04/2019 3. Type II  diabetes: Dr. Buddy Duty following.05/04/2019. 4. Reactive Depression: Continue Zoloft.05/04/2019. 5. Myofascial Muscle Pain: Continuecurrent medication regimen withSoma.05/04/2019 6.Left  Chronic Knee Pain: Continue HEP as Tolerated. Continue to Monitor.05/04/2019  15 minutes of face to face patient care time was spent during this visit. All questions were encouraged and answered.  F/U in43months

## 2019-05-05 DIAGNOSIS — M1712 Unilateral primary osteoarthritis, left knee: Secondary | ICD-10-CM | POA: Diagnosis not present

## 2019-05-12 DIAGNOSIS — E1122 Type 2 diabetes mellitus with diabetic chronic kidney disease: Secondary | ICD-10-CM | POA: Diagnosis not present

## 2019-05-12 DIAGNOSIS — E1165 Type 2 diabetes mellitus with hyperglycemia: Secondary | ICD-10-CM | POA: Diagnosis not present

## 2019-05-12 DIAGNOSIS — I10 Essential (primary) hypertension: Secondary | ICD-10-CM | POA: Diagnosis not present

## 2019-05-12 DIAGNOSIS — F324 Major depressive disorder, single episode, in partial remission: Secondary | ICD-10-CM | POA: Diagnosis not present

## 2019-05-12 DIAGNOSIS — E7800 Pure hypercholesterolemia, unspecified: Secondary | ICD-10-CM | POA: Diagnosis not present

## 2019-05-12 DIAGNOSIS — J45991 Cough variant asthma: Secondary | ICD-10-CM | POA: Diagnosis not present

## 2019-05-12 DIAGNOSIS — N183 Chronic kidney disease, stage 3 (moderate): Secondary | ICD-10-CM | POA: Diagnosis not present

## 2019-05-12 DIAGNOSIS — J45909 Unspecified asthma, uncomplicated: Secondary | ICD-10-CM | POA: Diagnosis not present

## 2019-05-17 DIAGNOSIS — R102 Pelvic and perineal pain: Secondary | ICD-10-CM | POA: Diagnosis not present

## 2019-05-17 DIAGNOSIS — N3941 Urge incontinence: Secondary | ICD-10-CM | POA: Diagnosis not present

## 2019-05-21 DIAGNOSIS — M1712 Unilateral primary osteoarthritis, left knee: Secondary | ICD-10-CM | POA: Diagnosis not present

## 2019-05-28 DIAGNOSIS — M1712 Unilateral primary osteoarthritis, left knee: Secondary | ICD-10-CM | POA: Diagnosis not present

## 2019-06-10 ENCOUNTER — Encounter: Payer: Self-pay | Admitting: Women's Health

## 2019-06-11 DIAGNOSIS — J45909 Unspecified asthma, uncomplicated: Secondary | ICD-10-CM | POA: Diagnosis not present

## 2019-06-11 DIAGNOSIS — J019 Acute sinusitis, unspecified: Secondary | ICD-10-CM | POA: Diagnosis not present

## 2019-06-11 DIAGNOSIS — J309 Allergic rhinitis, unspecified: Secondary | ICD-10-CM | POA: Diagnosis not present

## 2019-06-24 DIAGNOSIS — Z20828 Contact with and (suspected) exposure to other viral communicable diseases: Secondary | ICD-10-CM | POA: Diagnosis not present

## 2019-06-29 ENCOUNTER — Encounter: Payer: PPO | Admitting: Registered Nurse

## 2019-07-02 DIAGNOSIS — Z794 Long term (current) use of insulin: Secondary | ICD-10-CM | POA: Diagnosis not present

## 2019-07-02 DIAGNOSIS — N183 Chronic kidney disease, stage 3 (moderate): Secondary | ICD-10-CM | POA: Diagnosis not present

## 2019-07-02 DIAGNOSIS — R6889 Other general symptoms and signs: Secondary | ICD-10-CM | POA: Diagnosis not present

## 2019-07-02 DIAGNOSIS — E1122 Type 2 diabetes mellitus with diabetic chronic kidney disease: Secondary | ICD-10-CM | POA: Diagnosis not present

## 2019-07-02 DIAGNOSIS — E113293 Type 2 diabetes mellitus with mild nonproliferative diabetic retinopathy without macular edema, bilateral: Secondary | ICD-10-CM | POA: Diagnosis not present

## 2019-07-05 ENCOUNTER — Encounter: Payer: PPO | Attending: Physical Medicine & Rehabilitation | Admitting: Registered Nurse

## 2019-07-05 ENCOUNTER — Other Ambulatory Visit: Payer: Self-pay

## 2019-07-05 ENCOUNTER — Encounter: Payer: Self-pay | Admitting: Registered Nurse

## 2019-07-05 VITALS — BP 158/93 | HR 93 | Temp 98.5°F | Ht 68.0 in | Wt 392.0 lb

## 2019-07-05 DIAGNOSIS — Z794 Long term (current) use of insulin: Secondary | ICD-10-CM | POA: Insufficient documentation

## 2019-07-05 DIAGNOSIS — M25562 Pain in left knee: Secondary | ICD-10-CM | POA: Diagnosis not present

## 2019-07-05 DIAGNOSIS — Z5181 Encounter for therapeutic drug level monitoring: Secondary | ICD-10-CM

## 2019-07-05 DIAGNOSIS — Z79899 Other long term (current) drug therapy: Secondary | ICD-10-CM | POA: Insufficient documentation

## 2019-07-05 DIAGNOSIS — M545 Low back pain: Secondary | ICD-10-CM | POA: Diagnosis not present

## 2019-07-05 DIAGNOSIS — Z7982 Long term (current) use of aspirin: Secondary | ICD-10-CM | POA: Insufficient documentation

## 2019-07-05 DIAGNOSIS — M47816 Spondylosis without myelopathy or radiculopathy, lumbar region: Secondary | ICD-10-CM

## 2019-07-05 DIAGNOSIS — M546 Pain in thoracic spine: Secondary | ICD-10-CM

## 2019-07-05 DIAGNOSIS — I10 Essential (primary) hypertension: Secondary | ICD-10-CM | POA: Diagnosis not present

## 2019-07-05 DIAGNOSIS — E669 Obesity, unspecified: Secondary | ICD-10-CM | POA: Insufficient documentation

## 2019-07-05 DIAGNOSIS — E119 Type 2 diabetes mellitus without complications: Secondary | ICD-10-CM | POA: Insufficient documentation

## 2019-07-05 DIAGNOSIS — G8929 Other chronic pain: Secondary | ICD-10-CM | POA: Diagnosis not present

## 2019-07-05 DIAGNOSIS — M7918 Myalgia, other site: Secondary | ICD-10-CM

## 2019-07-05 DIAGNOSIS — G894 Chronic pain syndrome: Secondary | ICD-10-CM | POA: Diagnosis not present

## 2019-07-05 MED ORDER — OXYCODONE HCL 10 MG PO TABS: 10.0000 mg | ORAL_TABLET | Freq: Three times a day (TID) | ORAL | 0 refills | Status: DC | PRN

## 2019-07-05 NOTE — Progress Notes (Signed)
Subjective:    Patient ID: Kristen [name], female    DOB: September 24, 1966, 53 y.o.   MRN: [id number]  HPI: Kristen [name] is a 53 y.o. female who returns for follow up appointment for chronic pain and medication refill. She states her pain is located in her mid- lower back. She rates her  Pain 6. Her current exercise regime is walking and performing stretching exercises.  Ms. Weitman Morphine equivalent is 45.00  MME.  Last Oral Swab  Was Performed on 11/09/2018, it was consistent. Oral Swab was Performed today.   Pain Inventory Average Pain 7 Pain Right Now 6 My pain is constant, sharp, stabbing and aching  In the last 24 hours, has pain interfered with the following? General activity 6 Relation with others 5 Enjoyment of life 8 What TIME of day is your pain at its worst? all day Sleep (in general) Poor  Pain is worse with: walking, bending, standing and some activites Pain improves with: rest, heat/ice and medication Relief from Meds: 7  Mobility how many minutes can you walk? 10 ability to climb steps?  no do you drive?  yes Do you have any goals in this area?  yes  Function disabled: date disabled 2017 I need assistance with the following:  meal prep, household duties and shopping Do you have any goals in this area?  yes  Neuro/Psych bladder control problems tremor spasms depression anxiety  Prior Studies Any changes since last visit?  no  Physicians involved in your care Any changes since last visit?  no   Family History  Problem Relation Age of Onset  . Hypertension Maternal Grandmother   . Stroke Maternal Grandmother   . Diabetes Maternal Grandfather   . Cancer Mother        lung  . Cancer Father   . Diabetes Sister   . Breast cancer Cousin   . Heart attack Neg Hx    Social History   Socioeconomic History  . Marital status: Single    Spouse name: Not on file  . Number of children: Not on file  . Years of education: Not on file  . Highest  education level: Not on file  Occupational History  . Not on file  Social Needs  . Financial resource strain: Not on file  . Food insecurity    Worry: Not on file    Inability: Not on file  . Transportation needs    Medical: Not on file    Non-medical: Not on file  Tobacco Use  . Smoking status: Never Smoker  . Smokeless tobacco: Never Used  Substance and Sexual Activity  . Alcohol use: No    Alcohol/week: 0.0 standard drinks  . Drug use: No  . Sexual activity: Not Currently    Comment: 1st intercourse 80 yo-1 partner  Lifestyle  . Physical activity    Days per week: Not on file    Minutes per session: Not on file  . Stress: Not on file  Relationships  . Social Herbalist on phone: Not on file    Gets together: Not on file    Attends religious service: Not on file    Active member of club or organization: Not on file    Attends meetings of clubs or organizations: Not on file    Relationship status: Not on file  Other Topics Concern  . Not on file  Social History Narrative  . Not on file   Past Surgical  History:  Procedure Laterality Date  . ABDOMINAL SURGERY     hernia repair  . CHOLECYSTECTOMY  [id number]  . COLONOSCOPY WITH PROPOFOL N/A 07/31/2016   Procedure: COLONOSCOPY WITH PROPOFOL;  Surgeon: Arta Silence, MD;  Location: [facility];  Service: Endoscopy;  Laterality: N/A;  . FOOT SURGERY Right 06/2007  . KNEE ARTHROSCOPY  09/2008   left  . KNEE ARTHROSCOPY  april 2011   left  . NASAL SINUS SURGERY    . NASAL SINUS SURGERY  1990  . SHOULDER SURGERY  2004   left   Past Medical History:  Diagnosis Date  . Anxiety   . Asthma   . 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 Depression   . Diabetes mellitus   . Hypertension   . Migraines   . Near syncope   . Obesity   . PCOS (polycystic ovarian syndrome)   . Peripheral edema   . Sleep apnea    uses cpap set on 10  . 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 2/9 03/30/2018 02/20/2018 01/26/2018 12/05/2017 12/25/2016 07/01/2016  Decreased Interest 2 0 3 3 3 2   Down, Depressed, Hopeless 2 0 3 3 3 3   PHQ - 2 Score 4 0 6 6 6 5   Altered sleeping - - 0 - - 3  Tired, decreased energy - - 2 - - 3  Change in appetite - - 0 - - 1  Feeling bad or failure about yourself  - - 3 - - 3  Trouble concentrating - - 0 - - 1  Moving slowly or fidgety/restless - - 0 - - 2  Suicidal thoughts - - 0 - - 1  PHQ-9 Score - - 11 - - 19  Difficult doing work/chores - - Very difficult - - -    Review of Systems  Constitutional: Positive for chills.  HENT: Negative.   Eyes: Negative.   Respiratory: Positive for apnea, cough and wheezing.   Cardiovascular: Negative.   Gastrointestinal: Positive for nausea.  Endocrine: Negative.   Genitourinary: Negative.   Musculoskeletal: Negative.   Skin: Negative.   Allergic/Immunologic: Negative.   Neurological: Positive for tremors.  Hematological: Negative.   Psychiatric/Behavioral: Positive for dysphoric mood. The patient is nervous/anxious.   All other systems reviewed and are negative.      Objective:   Physical Exam Vitals signs and nursing note reviewed.  Constitutional:      Appearance: Normal appearance.  Neck:     Musculoskeletal: Normal range of motion and neck supple.  Cardiovascular:     Rate and Rhythm: Normal rate and regular rhythm.     Pulses: Normal pulses.     Heart sounds: Normal heart sounds.  Pulmonary:     Effort: Pulmonary effort is normal.     Breath sounds: Normal breath sounds.  Musculoskeletal:     Right lower leg: Edema present.     Left lower leg: Edema present.     Comments: Normal Muscle Bulk and Muscle Testing Reveals:  Upper Extremities: Full ROM and Muscle Strength 5/5   Lumbar Paraspinal Tenderness: L-4-L-5 Lower  Extremities: Full ROM and Muscle Strength 5/5 Arises from Table with ease Narrow Based  Gait   Skin:    General: Skin is warm and dry.  Neurological:     Mental Status: She is alert and oriented to person, place, and time.  Psychiatric:        Mood and Affect: Mood normal.        Behavior: Behavior normal.           Assessment & Plan:  1.Spondylosisof Lumbar Region/ Lumbar Facet arthropathy:07/05/2019. Refilled: Oxycodone 10 mg one tablet every 8 hours as needed #90. Second script e-scribefor the following month. We will continue the opioid monitoring program, this consists of regular clinic visits, examinations, urine drug screen, pill counts as well as use of [place] Controlled Substance Reporting System.  2. Morbid obesity: Continue Healthy Diet Regime and HEP.0914/2020 3. Type II diabetes: Dr. Buddy Duty following.07/05/2019. 4. Reactive Depression: Continue Zoloft.07/05/2019. 5. Myofascial Muscle Pain: Continuecurrent medication regimen withSoma.07/05/2019 6.LeftChronic Knee Pain: Continue HEP as Tolerated. Continue to Monitor.07/05/2019  15 minutes of face to face patient care time was spent during this visit. All questions were encouraged and answered.  F/U in63months

## 2019-07-08 LAB — DRUG TOX MONITOR 1 W/CONF, ORAL FLD
Amphetamines: NEGATIVE ng/mL (ref <10)
Barbiturates: NEGATIVE ng/mL (ref <10)
Benzodiazepines: NEGATIVE ng/mL (ref <0.50)
Buprenorphine: NEGATIVE ng/mL (ref <0.10)
Carisoprodol: 70.3 ng/mL — ABNORMAL HIGH (ref <2.5)
Cocaine: NEGATIVE ng/mL (ref <5.0)
Codeine: NEGATIVE ng/mL (ref <2.5)
Dihydrocodeine: NEGATIVE ng/mL (ref <2.5)
Fentanyl: NEGATIVE ng/mL (ref <0.10)
Heroin Metabolite: NEGATIVE ng/mL (ref <1.0)
Hydrocodone: NEGATIVE ng/mL (ref <2.5)
Hydromorphone: NEGATIVE ng/mL (ref <2.5)
MARIJUANA: NEGATIVE ng/mL (ref <2.5)
MDMA: NEGATIVE ng/mL (ref <10)
Meprobamate: >250 ng/mL — ABNORMAL HIGH (ref <2.5)
Meprobamate: POSITIVE ng/mL — AB (ref <2.5)
Methadone: NEGATIVE ng/mL (ref <5.0)
Morphine: NEGATIVE ng/mL (ref <2.5)
Nicotine Metabolite: NEGATIVE ng/mL (ref <5.0)
Norhydrocodone: NEGATIVE ng/mL (ref <2.5)
Noroxycodone: 18.3 ng/mL — ABNORMAL HIGH (ref <2.5)
Opiates: POSITIVE ng/mL — AB (ref <2.5)
Oxycodone: 71 ng/mL — ABNORMAL HIGH (ref <2.5)
Oxymorphone: NEGATIVE ng/mL (ref <2.5)
Phencyclidine: NEGATIVE ng/mL (ref <10)
Tapentadol: NEGATIVE ng/mL (ref <5.0)
Tramadol: NEGATIVE ng/mL (ref <5.0)
Zolpidem: NEGATIVE ng/mL (ref <5.0)

## 2019-07-08 LAB — DRUG TOX ALC METAB W/CON, ORAL FLD: Alcohol Metabolite: NEGATIVE ng/mL (ref <25)

## 2019-07-13 ENCOUNTER — Telehealth: Payer: Self-pay | Admitting: *Deleted

## 2019-07-13 DIAGNOSIS — N183 Chronic kidney disease, stage 3 (moderate): Secondary | ICD-10-CM | POA: Diagnosis not present

## 2019-07-13 DIAGNOSIS — E7800 Pure hypercholesterolemia, unspecified: Secondary | ICD-10-CM | POA: Diagnosis not present

## 2019-07-13 DIAGNOSIS — I10 Essential (primary) hypertension: Secondary | ICD-10-CM | POA: Diagnosis not present

## 2019-07-13 DIAGNOSIS — F324 Major depressive disorder, single episode, in partial remission: Secondary | ICD-10-CM | POA: Diagnosis not present

## 2019-07-13 DIAGNOSIS — J45909 Unspecified asthma, uncomplicated: Secondary | ICD-10-CM | POA: Diagnosis not present

## 2019-07-13 DIAGNOSIS — E1122 Type 2 diabetes mellitus with diabetic chronic kidney disease: Secondary | ICD-10-CM | POA: Diagnosis not present

## 2019-07-13 DIAGNOSIS — J45991 Cough variant asthma: Secondary | ICD-10-CM | POA: Diagnosis not present

## 2019-07-13 DIAGNOSIS — E1165 Type 2 diabetes mellitus with hyperglycemia: Secondary | ICD-10-CM | POA: Diagnosis not present

## 2019-07-13 NOTE — Telephone Encounter (Signed)
Urine drug screen for this encounter is consistent for prescribed medication 

## 2019-08-16 DIAGNOSIS — I10 Essential (primary) hypertension: Secondary | ICD-10-CM | POA: Diagnosis not present

## 2019-08-16 DIAGNOSIS — G43009 Migraine without aura, not intractable, without status migrainosus: Secondary | ICD-10-CM | POA: Diagnosis not present

## 2019-08-16 DIAGNOSIS — E7800 Pure hypercholesterolemia, unspecified: Secondary | ICD-10-CM | POA: Diagnosis not present

## 2019-08-16 DIAGNOSIS — D6859 Other primary thrombophilia: Secondary | ICD-10-CM | POA: Diagnosis not present

## 2019-08-16 DIAGNOSIS — J45991 Cough variant asthma: Secondary | ICD-10-CM | POA: Diagnosis not present

## 2019-08-16 DIAGNOSIS — R3915 Urgency of urination: Secondary | ICD-10-CM | POA: Diagnosis not present

## 2019-08-16 DIAGNOSIS — Z86711 Personal history of pulmonary embolism: Secondary | ICD-10-CM | POA: Diagnosis not present

## 2019-08-16 DIAGNOSIS — F324 Major depressive disorder, single episode, in partial remission: Secondary | ICD-10-CM | POA: Diagnosis not present

## 2019-08-16 DIAGNOSIS — Z8639 Personal history of other endocrine, nutritional and metabolic disease: Secondary | ICD-10-CM | POA: Diagnosis not present

## 2019-08-16 DIAGNOSIS — Z0000 Encounter for general adult medical examination without abnormal findings: Secondary | ICD-10-CM | POA: Diagnosis not present

## 2019-08-16 DIAGNOSIS — N183 Chronic kidney disease, stage 3 unspecified: Secondary | ICD-10-CM | POA: Diagnosis not present

## 2019-08-31 ENCOUNTER — Encounter: Payer: Self-pay | Admitting: Registered Nurse

## 2019-08-31 ENCOUNTER — Encounter: Payer: PPO | Attending: Physical Medicine & Rehabilitation | Admitting: Registered Nurse

## 2019-08-31 ENCOUNTER — Other Ambulatory Visit: Payer: Self-pay

## 2019-08-31 VITALS — BP 138/61 | HR 85 | Temp 97.7°F | Ht 68.0 in | Wt 392.0 lb

## 2019-08-31 DIAGNOSIS — M546 Pain in thoracic spine: Secondary | ICD-10-CM | POA: Diagnosis not present

## 2019-08-31 DIAGNOSIS — G8929 Other chronic pain: Secondary | ICD-10-CM | POA: Diagnosis not present

## 2019-08-31 DIAGNOSIS — Z79899 Other long term (current) drug therapy: Secondary | ICD-10-CM | POA: Insufficient documentation

## 2019-08-31 DIAGNOSIS — M545 Low back pain: Secondary | ICD-10-CM | POA: Diagnosis not present

## 2019-08-31 DIAGNOSIS — Z794 Long term (current) use of insulin: Secondary | ICD-10-CM | POA: Diagnosis not present

## 2019-08-31 DIAGNOSIS — M47816 Spondylosis without myelopathy or radiculopathy, lumbar region: Secondary | ICD-10-CM | POA: Diagnosis not present

## 2019-08-31 DIAGNOSIS — G894 Chronic pain syndrome: Secondary | ICD-10-CM | POA: Diagnosis not present

## 2019-08-31 DIAGNOSIS — I10 Essential (primary) hypertension: Secondary | ICD-10-CM | POA: Diagnosis not present

## 2019-08-31 DIAGNOSIS — Z7982 Long term (current) use of aspirin: Secondary | ICD-10-CM | POA: Insufficient documentation

## 2019-08-31 DIAGNOSIS — Z5181 Encounter for therapeutic drug level monitoring: Secondary | ICD-10-CM

## 2019-08-31 DIAGNOSIS — M25562 Pain in left knee: Secondary | ICD-10-CM | POA: Diagnosis not present

## 2019-08-31 DIAGNOSIS — E119 Type 2 diabetes mellitus without complications: Secondary | ICD-10-CM | POA: Diagnosis not present

## 2019-08-31 DIAGNOSIS — Z79891 Long term (current) use of opiate analgesic: Secondary | ICD-10-CM

## 2019-08-31 DIAGNOSIS — M7918 Myalgia, other site: Secondary | ICD-10-CM

## 2019-08-31 DIAGNOSIS — E669 Obesity, unspecified: Secondary | ICD-10-CM | POA: Insufficient documentation

## 2019-08-31 MED ORDER — OXYCODONE HCL 10 MG PO TABS: 10.0000 mg | ORAL_TABLET | Freq: Three times a day (TID) | ORAL | 0 refills | Status: DC | PRN

## 2019-08-31 NOTE — Progress Notes (Signed)
Subjective:    Patient ID: Kristen [name], female    DOB: May 07, 1966, 53 y.o.   MRN: [id number]  HPI: Kristen [name] is a 53 y.o. female who returns for follow up appointment for chronic pain and medication refill. She states her pain is located in her Mid- lower back mainly right side. She rates her pain 8. Her  current exercise regime is walking, performing house hold chores and performing stretching exercises.  [name] reports 4 weeks ago she had a near mis fall, states her left knee gave out she was able to brace herself from falling. Also reports last Saturday she tripped over a cement edging she was able to brace her fall by leaning against her car. Educated on falls prevention, she verbalizes understanding.   [name] Morphine equivalent is 45.00 MME.   [name] had codeine syrup prescribed by Dr. Harrington Challenger on 08/18/2019, she states she hasn't use any at this time. At times she has a chronic cough, she was educated not to take her Oxycodone when she is using codeine cough syrup, she verbalizes understanding.   Last UDS was Performed on 07/07/2019, it was consistent.   Pain Inventory Average Pain 7 Pain Right Now 8 My pain is stabbing and aching  In the last 24 hours, has pain interfered with the following? General activity 8 Relation with others 9 Enjoyment of life 9 What TIME of day is your pain at its worst? all Sleep (in general) Poor  Pain is worse with: walking, standing and some activites Pain improves with: rest and medication Relief from Meds: 7  Mobility walk without assistance ability to climb steps?  no do you drive?  yes  Function disabled: date disabled . I need assistance with the following:  household duties and shopping  Neuro/Psych bladder control problems spasms depression anxiety  Prior Studies Any changes since last visit?  no  Physicians involved in your care Any changes since last visit?  no   Family History  Problem Relation  Age of Onset  . Hypertension Maternal Grandmother   . Stroke Maternal Grandmother   . Diabetes Maternal Grandfather   . Cancer Mother        lung  . Cancer Father   . Diabetes Sister   . Breast cancer Cousin   . Heart attack Neg Hx    Social History   Socioeconomic History  . Marital status: Single    Spouse name: Not on file  . Number of children: Not on file  . Years of education: Not on file  . Highest education level: Not on file  Occupational History  . Not on file  Social Needs  . Financial resource strain: Not on file  . Food insecurity    Worry: Not on file    Inability: Not on file  . Transportation needs    Medical: Not on file    Non-medical: Not on file  Tobacco Use  . Smoking status: Never Smoker  . Smokeless tobacco: Never Used  Substance and Sexual Activity  . Alcohol use: No    Alcohol/week: 0.0 standard drinks  . Drug use: No  . Sexual activity: Not Currently    Comment: 1st intercourse 69 yo-1 partner  Lifestyle  . Physical activity    Days per week: Not on file    Minutes per session: Not on file  . Stress: Not on file  Relationships  . Social connections    Talks on phone: Not on  file    Gets together: Not on file    Attends religious service: Not on file    Active member of club or organization: Not on file    Attends meetings of clubs or organizations: Not on file    Relationship status: Not on file  Other Topics Concern  . Not on file  Social History Narrative  . Not on file   Past Surgical History:  Procedure Laterality Date  . ABDOMINAL SURGERY     hernia repair  . CHOLECYSTECTOMY  [id number]  . COLONOSCOPY WITH PROPOFOL N/A 07/31/2016   Procedure: COLONOSCOPY WITH PROPOFOL;  Surgeon: Arta Silence, MD;  Location: [facility];  Service: Endoscopy;  Laterality: N/A;  . FOOT SURGERY Right 06/2007  . KNEE ARTHROSCOPY  09/2008   left  . KNEE ARTHROSCOPY  april 2011   left  . NASAL SINUS SURGERY    . NASAL SINUS SURGERY  1990   . SHOULDER SURGERY  2004   left   Past Medical History:  Diagnosis Date  . Anxiety   . Asthma   . 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 Depression   . Diabetes mellitus   . Hypertension   . Migraines   . Near syncope   . Obesity   . PCOS (polycystic ovarian syndrome)   . Peripheral edema   . Sleep apnea    uses cpap set on 10  . 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    BP 138/61   Pulse 85   Temp 97.7 F (36.5 C)   Ht 5\' 8"  (1.727 m)   Wt (!) 392 lb (177.8 kg)   SpO2 95%   BMI 59.60 kg/m   Opioid Risk Score:   Fall Risk Score:  `1  Depression screen PHQ 2/9  Depression screen [facility] 2/9 03/30/2018 02/20/2018 01/26/2018 12/05/2017 12/25/2016 07/01/2016  Decreased Interest 2 0 3 3 3 2   Down, Depressed, Hopeless 2 0 3 3 3 3   PHQ - 2 Score 4 0 6 6 6 5   Altered sleeping - - 0 - - 3  Tired, decreased energy - - 2 - - 3  Change in appetite - - 0 - - 1  Feeling bad or failure about yourself  - - 3 - - 3  Trouble concentrating - - 0 - - 1  Moving slowly or fidgety/restless - - 0 - - 2  Suicidal thoughts - - 0 - - 1  PHQ-9 Score - - 11 - - 19  Difficult doing work/chores - - Very difficult - - -    Review of Systems  Constitutional: Negative.   HENT: Negative.   Eyes: Negative.   Respiratory: Positive for cough.   Cardiovascular: Negative.   Gastrointestinal: Positive for nausea.  Endocrine: Negative.   Genitourinary: Negative.   Musculoskeletal: Positive for arthralgias, back pain and myalgias.  Skin: Negative.   Allergic/Immunologic: Negative.   Neurological: Negative.   Hematological: Negative.   Psychiatric/Behavioral: Positive for dysphoric mood. The patient is nervous/anxious.   All other systems reviewed and are negative.      Objective:   Physical Exam Vitals signs and nursing note reviewed.  Constitutional:      Appearance: Normal appearance. She is obese.  Neck:     Musculoskeletal: Normal range of motion and neck supple.   Cardiovascular:     Rate and Rhythm: Normal rate and regular rhythm.     Pulses: Normal pulses.  Heart sounds: Normal heart sounds.  Pulmonary:     Effort: Pulmonary effort is normal.     Breath sounds: Normal breath sounds.  Musculoskeletal:     Comments: Normal Muscle Bulk and Muscle Testing Reveals:  Upper Extremities: Full ROM and Muscle Strength 5/5 Lumbar Hypersensitivity : Mainly Right Side Lower Extremities: Full ROM and Muscle Strength 5/5 Arises from Table Slowly Narrow Based Gait   Skin:    General: Skin is warm and dry.  Neurological:     Mental Status: She is alert and oriented to person, place, and time.  Psychiatric:        Mood and Affect: Mood normal.        Behavior: Behavior normal.           Assessment & Plan:  1.Spondylosisof Lumbar Region/ Lumbar Facet arthropathy:08/31/2019. Refilled: Oxycodone 10 mg one tablet every 8 hours as needed #90. Second script e-scribefor the following month. We will continue the opioid monitoring program, this consists of regular clinic visits, examinations, urine drug screen, pill counts as well as use of [place] Controlled Substance Reporting System.  2. Morbid obesity: Continue Healthy Diet Regime and HEP.08/31/2019 3. Type II diabetes: Dr. Buddy Duty following.08/31/2019. 4. Reactive Depression: Continue Zoloft.08/31/2019. 5. Myofascial Muscle Pain: Continuecurrent medication regimen withSoma.08/31/2019 6.LeftChronic Knee Pain: Continue HEP as Tolerated. Continue to Monitor.08/31/2019 7. Near Mis- Fall: Educated on [place] Prevention: She verbalizes understanding.   15 minutes of face to face patient care time was spent during this visit. All questions were encouraged and answered.  F/U in71months

## 2019-09-01 DIAGNOSIS — E7800 Pure hypercholesterolemia, unspecified: Secondary | ICD-10-CM | POA: Diagnosis not present

## 2019-09-01 DIAGNOSIS — J45991 Cough variant asthma: Secondary | ICD-10-CM | POA: Diagnosis not present

## 2019-09-01 DIAGNOSIS — F324 Major depressive disorder, single episode, in partial remission: Secondary | ICD-10-CM | POA: Diagnosis not present

## 2019-09-01 DIAGNOSIS — I10 Essential (primary) hypertension: Secondary | ICD-10-CM | POA: Diagnosis not present

## 2019-09-01 DIAGNOSIS — E1122 Type 2 diabetes mellitus with diabetic chronic kidney disease: Secondary | ICD-10-CM | POA: Diagnosis not present

## 2019-09-01 DIAGNOSIS — E1165 Type 2 diabetes mellitus with hyperglycemia: Secondary | ICD-10-CM | POA: Diagnosis not present

## 2019-09-01 DIAGNOSIS — J45909 Unspecified asthma, uncomplicated: Secondary | ICD-10-CM | POA: Diagnosis not present

## 2019-09-13 DIAGNOSIS — F324 Major depressive disorder, single episode, in partial remission: Secondary | ICD-10-CM | POA: Diagnosis not present

## 2019-09-15 ENCOUNTER — Other Ambulatory Visit: Payer: Self-pay | Admitting: Registered Nurse

## 2019-09-15 DIAGNOSIS — M47816 Spondylosis without myelopathy or radiculopathy, lumbar region: Secondary | ICD-10-CM

## 2019-09-24 ENCOUNTER — Other Ambulatory Visit: Payer: Self-pay | Admitting: Women's Health

## 2019-09-24 DIAGNOSIS — Z1231 Encounter for screening mammogram for malignant neoplasm of breast: Secondary | ICD-10-CM

## 2019-09-30 DIAGNOSIS — R110 Nausea: Secondary | ICD-10-CM | POA: Diagnosis not present

## 2019-09-30 DIAGNOSIS — R3915 Urgency of urination: Secondary | ICD-10-CM | POA: Diagnosis not present

## 2019-09-30 DIAGNOSIS — R0609 Other forms of dyspnea: Secondary | ICD-10-CM | POA: Diagnosis not present

## 2019-09-30 DIAGNOSIS — N390 Urinary tract infection, site not specified: Secondary | ICD-10-CM | POA: Diagnosis not present

## 2019-10-21 DIAGNOSIS — Z794 Long term (current) use of insulin: Secondary | ICD-10-CM | POA: Diagnosis not present

## 2019-10-21 DIAGNOSIS — E113293 Type 2 diabetes mellitus with mild nonproliferative diabetic retinopathy without macular edema, bilateral: Secondary | ICD-10-CM | POA: Diagnosis not present

## 2019-10-21 DIAGNOSIS — E1122 Type 2 diabetes mellitus with diabetic chronic kidney disease: Secondary | ICD-10-CM | POA: Diagnosis not present

## 2019-10-21 DIAGNOSIS — N1831 Chronic kidney disease, stage 3a: Secondary | ICD-10-CM | POA: Diagnosis not present

## 2019-10-21 DIAGNOSIS — R6889 Other general symptoms and signs: Secondary | ICD-10-CM | POA: Diagnosis not present

## 2019-10-25 DIAGNOSIS — R5381 Other malaise: Secondary | ICD-10-CM | POA: Diagnosis not present

## 2019-10-25 DIAGNOSIS — R350 Frequency of micturition: Secondary | ICD-10-CM | POA: Diagnosis not present

## 2019-10-25 DIAGNOSIS — R1030 Lower abdominal pain, unspecified: Secondary | ICD-10-CM | POA: Diagnosis not present

## 2019-10-25 DIAGNOSIS — R3911 Hesitancy of micturition: Secondary | ICD-10-CM | POA: Diagnosis not present

## 2019-10-30 ENCOUNTER — Inpatient Hospital Stay (HOSPITAL_COMMUNITY)
Admission: EM | Admit: 2019-10-30 | Discharge: 2019-11-12 | DRG: 391 | Disposition: A | Discharge status: Skilled Nursing Facility | Payer: PPO | Attending: Internal Medicine | Admitting: Internal Medicine

## 2019-10-30 ENCOUNTER — Emergency Department (HOSPITAL_COMMUNITY): Payer: PPO

## 2019-10-30 ENCOUNTER — Encounter (HOSPITAL_COMMUNITY): Payer: Self-pay

## 2019-10-30 DIAGNOSIS — E1122 Type 2 diabetes mellitus with diabetic chronic kidney disease: Secondary | ICD-10-CM | POA: Diagnosis present

## 2019-10-30 DIAGNOSIS — I129 Hypertensive chronic kidney disease with stage 1 through stage 4 chronic kidney disease, or unspecified chronic kidney disease: Secondary | ICD-10-CM | POA: Diagnosis not present

## 2019-10-30 DIAGNOSIS — Z743 Need for continuous supervision: Secondary | ICD-10-CM | POA: Diagnosis not present

## 2019-10-30 DIAGNOSIS — Z221 Carrier of other intestinal infectious diseases: Secondary | ICD-10-CM

## 2019-10-30 DIAGNOSIS — R197 Diarrhea, unspecified: Secondary | ICD-10-CM | POA: Diagnosis not present

## 2019-10-30 DIAGNOSIS — N1831 Chronic kidney disease, stage 3a: Secondary | ICD-10-CM | POA: Diagnosis not present

## 2019-10-30 DIAGNOSIS — M6281 Muscle weakness (generalized): Secondary | ICD-10-CM | POA: Diagnosis not present

## 2019-10-30 DIAGNOSIS — R160 Hepatomegaly, not elsewhere classified: Secondary | ICD-10-CM | POA: Diagnosis not present

## 2019-10-30 DIAGNOSIS — M549 Dorsalgia, unspecified: Secondary | ICD-10-CM | POA: Diagnosis present

## 2019-10-30 DIAGNOSIS — Z7951 Long term (current) use of inhaled steroids: Secondary | ICD-10-CM

## 2019-10-30 DIAGNOSIS — Z86711 Personal history of pulmonary embolism: Secondary | ICD-10-CM

## 2019-10-30 DIAGNOSIS — F329 Major depressive disorder, single episode, unspecified: Secondary | ICD-10-CM | POA: Diagnosis present

## 2019-10-30 DIAGNOSIS — J45909 Unspecified asthma, uncomplicated: Secondary | ICD-10-CM | POA: Diagnosis present

## 2019-10-30 DIAGNOSIS — B9629 Other Escherichia coli [E. coli] as the cause of diseases classified elsewhere: Secondary | ICD-10-CM | POA: Diagnosis not present

## 2019-10-30 DIAGNOSIS — R3989 Other symptoms and signs involving the genitourinary system: Secondary | ICD-10-CM | POA: Diagnosis not present

## 2019-10-30 DIAGNOSIS — E1165 Type 2 diabetes mellitus with hyperglycemia: Secondary | ICD-10-CM | POA: Diagnosis present

## 2019-10-30 DIAGNOSIS — R1031 Right lower quadrant pain: Secondary | ICD-10-CM | POA: Diagnosis not present

## 2019-10-30 DIAGNOSIS — H5440 Blindness, one eye, unspecified eye: Secondary | ICD-10-CM | POA: Insufficient documentation

## 2019-10-30 DIAGNOSIS — N739 Female pelvic inflammatory disease, unspecified: Secondary | ICD-10-CM | POA: Diagnosis not present

## 2019-10-30 DIAGNOSIS — M5137 Other intervertebral disc degeneration, lumbosacral region: Secondary | ICD-10-CM | POA: Insufficient documentation

## 2019-10-30 DIAGNOSIS — Z7901 Long term (current) use of anticoagulants: Secondary | ICD-10-CM

## 2019-10-30 DIAGNOSIS — H547 Unspecified visual loss: Secondary | ICD-10-CM | POA: Diagnosis present

## 2019-10-30 DIAGNOSIS — E1121 Type 2 diabetes mellitus with diabetic nephropathy: Secondary | ICD-10-CM | POA: Diagnosis not present

## 2019-10-30 DIAGNOSIS — G4730 Sleep apnea, unspecified: Secondary | ICD-10-CM | POA: Diagnosis present

## 2019-10-30 DIAGNOSIS — M545 Low back pain, unspecified: Secondary | ICD-10-CM | POA: Insufficient documentation

## 2019-10-30 DIAGNOSIS — M5136 Other intervertebral disc degeneration, lumbar region: Secondary | ICD-10-CM | POA: Diagnosis not present

## 2019-10-30 DIAGNOSIS — Z9049 Acquired absence of other specified parts of digestive tract: Secondary | ICD-10-CM

## 2019-10-30 DIAGNOSIS — Z20822 Contact with and (suspected) exposure to covid-19: Secondary | ICD-10-CM | POA: Diagnosis not present

## 2019-10-30 DIAGNOSIS — N183 Chronic kidney disease, stage 3 unspecified: Secondary | ICD-10-CM

## 2019-10-30 DIAGNOSIS — G43009 Migraine without aura, not intractable, without status migrainosus: Secondary | ICD-10-CM | POA: Insufficient documentation

## 2019-10-30 DIAGNOSIS — J45998 Other asthma: Secondary | ICD-10-CM | POA: Diagnosis not present

## 2019-10-30 DIAGNOSIS — R0600 Dyspnea, unspecified: Secondary | ICD-10-CM

## 2019-10-30 DIAGNOSIS — K5780 Diverticulitis of intestine, part unspecified, with perforation and abscess without bleeding: Secondary | ICD-10-CM | POA: Diagnosis not present

## 2019-10-30 DIAGNOSIS — G8929 Other chronic pain: Secondary | ICD-10-CM | POA: Diagnosis present

## 2019-10-30 DIAGNOSIS — B370 Candidal stomatitis: Secondary | ICD-10-CM | POA: Diagnosis not present

## 2019-10-30 DIAGNOSIS — M47816 Spondylosis without myelopathy or radiculopathy, lumbar region: Secondary | ICD-10-CM

## 2019-10-30 DIAGNOSIS — Z794 Long term (current) use of insulin: Secondary | ICD-10-CM

## 2019-10-30 DIAGNOSIS — E872 Acidosis: Secondary | ICD-10-CM | POA: Diagnosis present

## 2019-10-30 DIAGNOSIS — L02818 Cutaneous abscess of other sites: Secondary | ICD-10-CM | POA: Diagnosis not present

## 2019-10-30 DIAGNOSIS — Z833 Family history of diabetes mellitus: Secondary | ICD-10-CM

## 2019-10-30 DIAGNOSIS — G894 Chronic pain syndrome: Secondary | ICD-10-CM | POA: Diagnosis not present

## 2019-10-30 DIAGNOSIS — K529 Noninfective gastroenteritis and colitis, unspecified: Secondary | ICD-10-CM | POA: Diagnosis present

## 2019-10-30 DIAGNOSIS — K5792 Diverticulitis of intestine, part unspecified, without perforation or abscess without bleeding: Secondary | ICD-10-CM

## 2019-10-30 DIAGNOSIS — R1084 Generalized abdominal pain: Secondary | ICD-10-CM | POA: Diagnosis not present

## 2019-10-30 DIAGNOSIS — F411 Generalized anxiety disorder: Secondary | ICD-10-CM | POA: Diagnosis not present

## 2019-10-30 DIAGNOSIS — R739 Hyperglycemia, unspecified: Secondary | ICD-10-CM | POA: Diagnosis not present

## 2019-10-30 DIAGNOSIS — R0902 Hypoxemia: Secondary | ICD-10-CM | POA: Diagnosis not present

## 2019-10-30 DIAGNOSIS — R0602 Shortness of breath: Secondary | ICD-10-CM | POA: Diagnosis not present

## 2019-10-30 DIAGNOSIS — Z7982 Long term (current) use of aspirin: Secondary | ICD-10-CM

## 2019-10-30 DIAGNOSIS — K5900 Constipation, unspecified: Secondary | ICD-10-CM | POA: Diagnosis present

## 2019-10-30 DIAGNOSIS — E871 Hypo-osmolality and hyponatremia: Secondary | ICD-10-CM | POA: Diagnosis present

## 2019-10-30 DIAGNOSIS — F324 Major depressive disorder, single episode, in partial remission: Secondary | ICD-10-CM | POA: Insufficient documentation

## 2019-10-30 DIAGNOSIS — Z6841 Body Mass Index (BMI) 40.0 and over, adult: Secondary | ICD-10-CM | POA: Diagnosis not present

## 2019-10-30 DIAGNOSIS — I69398 Other sequelae of cerebral infarction: Secondary | ICD-10-CM

## 2019-10-30 DIAGNOSIS — N179 Acute kidney failure, unspecified: Secondary | ICD-10-CM | POA: Diagnosis not present

## 2019-10-30 DIAGNOSIS — Z79899 Other long term (current) drug therapy: Secondary | ICD-10-CM

## 2019-10-30 DIAGNOSIS — B379 Candidiasis, unspecified: Secondary | ICD-10-CM | POA: Diagnosis not present

## 2019-10-30 DIAGNOSIS — J9601 Acute respiratory failure with hypoxia: Secondary | ICD-10-CM | POA: Diagnosis not present

## 2019-10-30 DIAGNOSIS — Z823 Family history of stroke: Secondary | ICD-10-CM

## 2019-10-30 DIAGNOSIS — F331 Major depressive disorder, recurrent, moderate: Secondary | ICD-10-CM | POA: Diagnosis not present

## 2019-10-30 DIAGNOSIS — K5720 Diverticulitis of large intestine with perforation and abscess without bleeding: Secondary | ICD-10-CM | POA: Diagnosis not present

## 2019-10-30 DIAGNOSIS — N390 Urinary tract infection, site not specified: Secondary | ICD-10-CM | POA: Diagnosis not present

## 2019-10-30 DIAGNOSIS — R279 Unspecified lack of coordination: Secondary | ICD-10-CM | POA: Diagnosis not present

## 2019-10-30 DIAGNOSIS — E119 Type 2 diabetes mellitus without complications: Secondary | ICD-10-CM | POA: Diagnosis not present

## 2019-10-30 DIAGNOSIS — Z79891 Long term (current) use of opiate analgesic: Secondary | ICD-10-CM

## 2019-10-30 DIAGNOSIS — R52 Pain, unspecified: Secondary | ICD-10-CM | POA: Diagnosis not present

## 2019-10-30 DIAGNOSIS — K630 Abscess of intestine: Secondary | ICD-10-CM | POA: Diagnosis not present

## 2019-10-30 DIAGNOSIS — I10 Essential (primary) hypertension: Secondary | ICD-10-CM

## 2019-10-30 DIAGNOSIS — Z8249 Family history of ischemic heart disease and other diseases of the circulatory system: Secondary | ICD-10-CM

## 2019-10-30 DIAGNOSIS — Z86718 Personal history of other venous thrombosis and embolism: Secondary | ICD-10-CM

## 2019-10-30 DIAGNOSIS — U071 COVID-19: Secondary | ICD-10-CM | POA: Diagnosis not present

## 2019-10-30 DIAGNOSIS — K651 Peritoneal abscess: Secondary | ICD-10-CM

## 2019-10-30 DIAGNOSIS — I517 Cardiomegaly: Secondary | ICD-10-CM | POA: Diagnosis not present

## 2019-10-30 HISTORY — DX: Personal history of pulmonary embolism: Z86.711

## 2019-10-30 LAB — CBC
HCT: 41.1 % (ref 36.0–46.0)
Hemoglobin: 12.1 g/dL (ref 12.0–15.0)
MCH: 25.8 pg — ABNORMAL LOW (ref 26.0–34.0)
MCHC: 29.4 g/dL — ABNORMAL LOW (ref 30.0–36.0)
MCV: 87.6 fL (ref 80.0–100.0)
Platelets: 496 10*3/uL — ABNORMAL HIGH (ref 150–400)
RBC: 4.69 MIL/uL (ref 3.87–5.11)
RDW: 14.4 % (ref 11.5–15.5)
WBC: 26.6 10*3/uL — ABNORMAL HIGH (ref 4.0–10.5)
nRBC: 0.2 % (ref 0.0–0.2)

## 2019-10-30 LAB — COMPREHENSIVE METABOLIC PANEL
ALT: 39 U/L (ref 0–44)
AST: 20 U/L (ref 15–41)
Albumin: 3.1 g/dL — ABNORMAL LOW (ref 3.5–5.0)
Alkaline Phosphatase: 97 U/L (ref 38–126)
Anion gap: 13 (ref 5–15)
BUN: 26 mg/dL — ABNORMAL HIGH (ref 6–20)
CO2: 18 mmol/L — ABNORMAL LOW (ref 22–32)
Calcium: 9.1 mg/dL (ref 8.9–10.3)
Chloride: 98 mmol/L (ref 98–111)
Creatinine, Ser: 1.86 mg/dL — ABNORMAL HIGH (ref 0.44–1.00)
GFR calc Af Amer: 35 mL/min — ABNORMAL LOW (ref >60)
GFR calc non Af Amer: 30 mL/min — ABNORMAL LOW (ref >60)
Glucose, Bld: 398 mg/dL — ABNORMAL HIGH (ref 70–99)
Potassium: 5.1 mmol/L (ref 3.5–5.1)
Sodium: 129 mmol/L — ABNORMAL LOW (ref 135–145)
Total Bilirubin: 1.2 mg/dL (ref 0.3–1.2)
Total Protein: 7.5 g/dL (ref 6.5–8.1)

## 2019-10-30 LAB — CBG MONITORING, ED
Glucose-Capillary: 330 mg/dL — ABNORMAL HIGH (ref 70–99)
Glucose-Capillary: 396 mg/dL — ABNORMAL HIGH (ref 70–99)
Glucose-Capillary: 412 mg/dL — ABNORMAL HIGH (ref 70–99)

## 2019-10-30 LAB — LIPASE, BLOOD: Lipase: 23 U/L (ref 11–51)

## 2019-10-30 LAB — BLOOD GAS, VENOUS
Acid-base deficit: 5.6 mmol/L — ABNORMAL HIGH (ref 0.0–2.0)
Bicarbonate: 18.8 mmol/L — ABNORMAL LOW (ref 20.0–28.0)
O2 Saturation: 86.9 %
Patient temperature: 98.6
pCO2, Ven: 35.1 mmHg — ABNORMAL LOW (ref 44.0–60.0)
pH, Ven: 7.349 (ref 7.250–7.430)
pO2, Ven: 57.1 mmHg — ABNORMAL HIGH (ref 32.0–45.0)

## 2019-10-30 LAB — I-STAT BETA HCG BLOOD, ED (MC, WL, AP ONLY): I-stat hCG, quantitative: <5 m[IU]/mL (ref <5)

## 2019-10-30 LAB — LACTIC ACID, PLASMA: Lactic Acid, Venous: 1.3 mmol/L (ref 0.5–1.9)

## 2019-10-30 LAB — BETA-HYDROXYBUTYRIC ACID: Beta-Hydroxybutyric Acid: 2.58 mmol/L — ABNORMAL HIGH (ref 0.05–0.27)

## 2019-10-30 MED ORDER — HYDROMORPHONE HCL 1 MG/ML IJ SOLN
1.0000 mg | Freq: Once | INTRAMUSCULAR | Status: AC
  Administered 2019-10-30: 1 mg via INTRAVENOUS
  Filled 2019-10-30: qty 1

## 2019-10-30 MED ORDER — HEPARIN SODIUM (PORCINE) 5000 UNIT/ML IJ SOLN
5000.0000 [IU] | Freq: Three times a day (TID) | INTRAMUSCULAR | Status: DC
  Administered 2019-10-31 – 2019-11-02 (×7): 5000 [IU] via SUBCUTANEOUS
  Filled 2019-10-30 (×7): qty 1

## 2019-10-30 MED ORDER — SODIUM CHLORIDE 0.9 % IV SOLN: 2.0000 g | Freq: Once | INTRAVENOUS | Status: DC

## 2019-10-30 MED ORDER — SODIUM CHLORIDE 0.9 % IV SOLN: INTRAVENOUS | Status: AC

## 2019-10-30 MED ORDER — PIPERACILLIN-TAZOBACTAM 3.375 G IVPB
3.3750 g | Freq: Three times a day (TID) | INTRAVENOUS | Status: DC
  Administered 2019-10-30 – 2019-11-12 (×38): 3.375 g via INTRAVENOUS
  Filled 2019-10-30 (×40): qty 50

## 2019-10-30 MED ORDER — MOMETASONE FURO-FORMOTEROL FUM 200-5 MCG/ACT IN AERO
2.0000 | INHALATION_SPRAY | Freq: Two times a day (BID) | RESPIRATORY_TRACT | Status: DC
  Administered 2019-10-31 – 2019-11-12 (×25): 2 via RESPIRATORY_TRACT
  Filled 2019-10-30: qty 8.8

## 2019-10-30 MED ORDER — VITAMIN D3 25 MCG (1000 UNIT) PO TABS
2000.0000 [IU] | ORAL_TABLET | Freq: Every day | ORAL | Status: DC
  Administered 2019-11-01 – 2019-11-12 (×12): 2000 [IU] via ORAL
  Filled 2019-10-30 (×12): qty 2

## 2019-10-30 MED ORDER — HYDROMORPHONE HCL 1 MG/ML IJ SOLN
0.5000 mg | INTRAMUSCULAR | Status: DC | PRN
  Administered 2019-10-31: 0.5 mg via INTRAVENOUS
  Filled 2019-10-30: qty 0.5

## 2019-10-30 MED ORDER — NYSTATIN 100000 UNIT/GM EX CREA: 1.0000 "application " | TOPICAL_CREAM | Freq: Two times a day (BID) | CUTANEOUS | Status: DC

## 2019-10-30 MED ORDER — PIPERACILLIN-TAZOBACTAM IN DEX 2-0.25 GM/50ML IV SOLN: 2.2500 g | Freq: Four times a day (QID) | INTRAVENOUS | Status: DC

## 2019-10-30 MED ORDER — METRONIDAZOLE IN NACL 5-0.79 MG/ML-% IV SOLN: 500.0000 mg | Freq: Once | INTRAVENOUS | Status: DC

## 2019-10-30 MED ORDER — ATORVASTATIN CALCIUM 40 MG PO TABS
40.0000 mg | ORAL_TABLET | Freq: Every day | ORAL | Status: DC
  Administered 2019-11-01 – 2019-11-12 (×12): 40 mg via ORAL
  Filled 2019-10-30 (×12): qty 1

## 2019-10-30 MED ORDER — INSULIN ASPART 100 UNIT/ML ~~LOC~~ SOLN
0.0000 [IU] | SUBCUTANEOUS | Status: DC
  Administered 2019-10-31: 15 [IU] via SUBCUTANEOUS
  Administered 2019-10-31: 3 [IU] via SUBCUTANEOUS
  Administered 2019-10-31: 20 [IU] via SUBCUTANEOUS
  Administered 2019-10-31 (×2): 7 [IU] via SUBCUTANEOUS
  Administered 2019-11-01: 01:00:00 4 [IU] via SUBCUTANEOUS
  Administered 2019-11-01 (×2): 3 [IU] via SUBCUTANEOUS
  Administered 2019-11-01: 08:00:00 4 [IU] via SUBCUTANEOUS
  Administered 2019-11-01: 05:00:00 3 [IU] via SUBCUTANEOUS
  Administered 2019-11-03: 4 [IU] via SUBCUTANEOUS
  Administered 2019-11-03: 7 [IU] via SUBCUTANEOUS
  Administered 2019-11-04: 4 [IU] via SUBCUTANEOUS
  Administered 2019-11-04: 3 [IU] via SUBCUTANEOUS
  Administered 2019-11-04 – 2019-11-05 (×3): 4 [IU] via SUBCUTANEOUS
  Administered 2019-11-05 (×2): 3 [IU] via SUBCUTANEOUS
  Administered 2019-11-05: 4 [IU] via SUBCUTANEOUS
  Administered 2019-11-05 – 2019-11-06 (×3): 3 [IU] via SUBCUTANEOUS
  Administered 2019-11-06: 4 [IU] via SUBCUTANEOUS
  Administered 2019-11-06 – 2019-11-07 (×2): 3 [IU] via SUBCUTANEOUS
  Administered 2019-11-07: 4 [IU] via SUBCUTANEOUS
  Administered 2019-11-07 (×2): 3 [IU] via SUBCUTANEOUS
  Administered 2019-11-08: 15 [IU] via SUBCUTANEOUS
  Administered 2019-11-08 (×2): 4 [IU] via SUBCUTANEOUS
  Administered 2019-11-08 – 2019-11-09 (×3): 3 [IU] via SUBCUTANEOUS
  Administered 2019-11-09 – 2019-11-10 (×3): 7 [IU] via SUBCUTANEOUS
  Administered 2019-11-10 – 2019-11-11 (×3): 4 [IU] via SUBCUTANEOUS
  Administered 2019-11-11 (×3): 3 [IU] via SUBCUTANEOUS
  Administered 2019-11-12: 4 [IU] via SUBCUTANEOUS
  Administered 2019-11-12: 3 [IU] via SUBCUTANEOUS
  Filled 2019-10-30: qty 0.2

## 2019-10-30 MED ORDER — SODIUM CHLORIDE 0.9% FLUSH
3.0000 mL | Freq: Two times a day (BID) | INTRAVENOUS | Status: DC
  Administered 2019-10-30 – 2019-11-12 (×23): 3 mL via INTRAVENOUS

## 2019-10-30 MED ORDER — SODIUM CHLORIDE 0.9 % IV BOLUS
1000.0000 mL | Freq: Once | INTRAVENOUS | Status: AC
  Administered 2019-10-30: 1000 mL via INTRAVENOUS

## 2019-10-30 MED ORDER — SERTRALINE HCL 50 MG PO TABS
200.0000 mg | ORAL_TABLET | Freq: Every day | ORAL | Status: DC
  Administered 2019-11-01 – 2019-11-12 (×12): 200 mg via ORAL
  Filled 2019-10-30 (×12): qty 4

## 2019-10-30 MED ORDER — MONTELUKAST SODIUM 10 MG PO TABS
10.0000 mg | ORAL_TABLET | Freq: Every day | ORAL | Status: DC
  Administered 2019-10-31 – 2019-11-11 (×12): 10 mg via ORAL
  Filled 2019-10-30 (×12): qty 1

## 2019-10-30 MED ORDER — INSULIN GLARGINE 100 UNIT/ML ~~LOC~~ SOLN
60.0000 [IU] | Freq: Every day | SUBCUTANEOUS | Status: DC
  Administered 2019-10-31 – 2019-11-11 (×13): 60 [IU] via SUBCUTANEOUS
  Filled 2019-10-30 (×14): qty 0.6

## 2019-10-30 MED ORDER — ONDANSETRON HCL 4 MG/2ML IJ SOLN
4.0000 mg | Freq: Once | INTRAMUSCULAR | Status: AC
  Administered 2019-10-30: 4 mg via INTRAVENOUS
  Filled 2019-10-30: qty 2

## 2019-10-30 MED ORDER — INSULIN ASPART 100 UNIT/ML ~~LOC~~ SOLN
15.0000 [IU] | SUBCUTANEOUS | Status: DC
  Administered 2019-10-31 (×3): 15 [IU] via SUBCUTANEOUS
  Filled 2019-10-30: qty 0.15

## 2019-10-30 MED ORDER — ALBUTEROL SULFATE (2.5 MG/3ML) 0.083% IN NEBU: 3.0000 mL | INHALATION_SOLUTION | Freq: Three times a day (TID) | RESPIRATORY_TRACT | Status: DC | PRN

## 2019-10-30 MED ORDER — ACETAMINOPHEN 325 MG PO TABS
650.0000 mg | ORAL_TABLET | Freq: Four times a day (QID) | ORAL | Status: DC | PRN
  Administered 2019-11-01 – 2019-11-03 (×2): 650 mg via ORAL
  Filled 2019-10-30 (×2): qty 2

## 2019-10-30 NOTE — ED Provider Notes (Signed)
[facility] DEPT Provider Note   CSN: [id number] Arrival date & time: 10/30/19  1232     History Chief Complaint  Patient presents with   Abdominal Pain    Kristen [name] is a 54 y.o. female.  54 year old female with multiple medical problems listed below including insulin-dependent diabetes, on Eliquis for PE, presents with complaint of right-sided abdominal pain x2 days with nausea and constipation (constipation improved with stool softeners).  Pain is constant, severe, sharp in nature.  Nothing makes pain better or worse.  Patient reports history of recurrent urinary tract infection, states she has been treated with Macrobid and Augmentin, reports symptoms as urinary frequency without dysuria.  No other complaints or concerns.  Prior abdominal surgeries include cholecystectomy and hernia repair. Last took Eliquis last night.         Past Medical History:  Diagnosis Date   Anxiety    Asthma    Degenerative arthritis    in back   Depression    Diabetes mellitus    Hypertension    Migraines    Near syncope    Obesity    PCOS (polycystic ovarian syndrome)    Peripheral edema    Sleep apnea    uses cpap set on 10   Stroke ([place]) 12/2011   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     Patient Active Problem List   Diagnosis Date Noted   Hyperkalemia 04/27/2019   Poor vision 04/27/2019   Left knee pain 01/06/2019   CKD (chronic kidney disease), stage III 04/20/2018   Right shoulder tendonitis 03/30/2018   Myofascial muscle pain 05/21/2017   Pulmonary embolism ([place]) 03/21/2017   Pulmonary emboli ([place]) 03/21/2017   Cellulitis 12/22/2016   Cellulitis of left lower extremity    Reactive depression 11/27/2016   Spondylosis of lumbar region without myelopathy or radiculopathy 07/01/2016   Lumbar facet arthropathy 07/01/2016   Dizziness 06/15/2015   Morbid obesity ([place]) 06/15/2015   Diabetes mellitus type 2,  uncomplicated ([place]) 01/74/9449   Hyperlipidemia 06/15/2015   Chronic pain syndrome 06/15/2015   Amaurosis fugax 11/03/2012   Orthostatic hypotension 10/30/2012   Headache(784.0) 10/30/2012   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   Hypercholesteremia 10/01/2012   Diabetes 1.5, 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   Hypertension 06/18/2012   Asthma in adult 06/18/2012   Morbid obesity with BMI of 60.0-69.9, adult ([place]) 06/18/2012    Past Surgical History:  Procedure Laterality Date   ABDOMINAL SURGERY     hernia repair   CHOLECYSTECTOMY  [id number]   COLONOSCOPY WITH PROPOFOL N/A 07/31/2016   Procedure: COLONOSCOPY WITH PROPOFOL;  Surgeon: Arta Silence, MD;  Location: [facility];  Service: Endoscopy;  Laterality: N/A;   FOOT SURGERY Right 06/2007   KNEE ARTHROSCOPY  09/2008   left   KNEE ARTHROSCOPY  april 2011   left   NASAL SINUS SURGERY     NASAL SINUS SURGERY  1990   SHOULDER SURGERY  2004   left     OB History    Gravida  0   Para      Term      Preterm      AB      Living        SAB      TAB      Ectopic      Multiple      Live Births              Family  History  Problem Relation Age of Onset   Hypertension Maternal Grandmother    Stroke Maternal Grandmother    Diabetes Maternal Grandfather    Cancer Mother        lung   Cancer Father    Diabetes Sister    Breast cancer Cousin    Heart attack Neg Hx     Social History   Tobacco Use   Smoking status: Never Smoker   Smokeless tobacco: Never Used  Substance Use Topics   Alcohol use: No    Alcohol/week: 0.0 standard drinks   Drug use: No    Home Medications Prior to Admission medications   Medication Sig Start Date End Date Taking? Authorizing Provider  albuterol (ACCUNEB) 1.25 MG/3ML nebulizer solution Inhale 1 ampule into the lungs 3 (three) times daily as needed for wheezing or shortness of breath.  12/12/10   [provider]  albuterol  (VENTOLIN HFA) 108 (90 Base) MCG/ACT inhaler Inhale 2 puffs into the lungs every 4 (four) hours as needed for wheezing or shortness of breath.  01/04/14   [provider]  apixaban (ELIQUIS) 5 MG TABS tablet Take two tabs twice a day for 7 days, then take one tab twice a day for a year. 03/24/17   Florencia Reasons, MD  aspirin 81 MG tablet Take 81 mg by mouth daily.    [provider]  atorvastatin (LIPITOR) 40 MG tablet Take 40 mg by mouth daily.    [provider]  BD PEN NEEDLE NANO U/F 32G X 4 MM MISC 2 (two) times daily. as directed 03/13/16   [provider]  buPROPion HCl (WELLBUTRIN PO) Take by mouth.    [provider]  carisoprodol (SOMA) 350 MG tablet TAKE 1 TABLET (350 MG TOTAL) BY MOUTH 3 (THREE) TIMES DAILY. 09/15/19   Meredith Staggers, MD  Cholecalciferol (VITAMIN D3) 2000 units capsule Take 2,000 Units by mouth daily.     [provider]  Fluticasone-Salmeterol (ADVAIR) 500-50 MCG/DOSE AEPB Inhale 1 puff into the lungs every 12 (twelve) hours.    [provider]  insulin aspart (NOVOLOG FLEXPEN) 100 UNIT/ML FlexPen Inject 15 Units into the skin 3 (three) times daily with meals. 03/24/17   Florencia Reasons, MD  insulin glargine (LANTUS) 100 UNIT/ML injection Inject 80-100 Units into the skin at bedtime.     [provider]  lisinopril (PRINIVIL,ZESTRIL) 20 MG tablet Take 1 tablet (20 mg total) by mouth daily. 03/24/17 05/03/19  Florencia Reasons, MD  montelukast (SINGULAIR) 10 MG tablet Take 10 mg by mouth at bedtime.     [provider]  nystatin cream (MYCOSTATIN) Apply 1 application topically 2 (two) times daily. 03/11/18   Huel Cote, NP  oxybutynin (DITROPAN) 5 MG tablet TAKE 1 TABLET BY MOUTH TWICE A DAY AS NEEDED FOR BLADDER 03/13/16   [provider]  Oxycodone HCl 10 MG TABS Take 1 tablet (10 mg total) by mouth every 8 (eight) hours as needed. 08/31/19   Bayard Hugger, NP  promethazine (PHENERGAN) 25 MG tablet Take  25 mg by mouth every 6 (six) hours as needed.     [provider]  senna-docusate (SENOKOT-S) 8.6-50 MG tablet Take 1 tablet by mouth at bedtime. 03/24/17   Florencia Reasons, MD  sertraline (ZOLOFT) 100 MG tablet Take 200 mg by mouth daily.     [provider]    Allergies    Cafergot, Glucophage [metformin hcl], and Canagliflozin  Review of Systems  Review of Systems  Constitutional: Positive for diaphoresis. Negative for fever.  Respiratory: Negative for cough and shortness of breath.   Cardiovascular: Negative for chest pain.  Gastrointestinal: Positive for abdominal pain, constipation and nausea. Negative for vomiting.  Genitourinary: Positive for frequency and urgency. Negative for dysuria.  Musculoskeletal: Negative for arthralgias and myalgias.  Skin: Negative for rash and wound.  Allergic/Immunologic: Positive for immunocompromised state.  Psychiatric/Behavioral: Negative for confusion.  All other systems reviewed and are negative.   Physical Exam Updated Vital Signs BP (!) 139/126 (BP Location: Left Arm)    Pulse 91    Temp 98.5 F (36.9 C) (Oral)    Resp (!) 24    SpO2 97%   Physical Exam Vitals and nursing note reviewed.  Constitutional:      General: She is not in acute distress.    Appearance: She is well-developed. She is obese. She is not diaphoretic.  HENT:     Head: Normocephalic and atraumatic.  Cardiovascular:     Rate and Rhythm: Normal rate and regular rhythm.  Pulmonary:     Effort: Tachypnea present.     Breath sounds: Normal breath sounds.  Abdominal:     Tenderness: There is abdominal tenderness in the right upper quadrant and right lower quadrant.     Comments: Exam limited due to girth, yeast infection noted to lower pannus without overlying cellulitis  Skin:    General: Skin is warm and dry.     Findings: No erythema.  Neurological:     Mental Status: She is alert and oriented to person, place, and time.  Psychiatric:         Behavior: Behavior normal.     ED Results / Procedures / Treatments   Labs (all labs ordered are listed, but only abnormal results are displayed) Labs Reviewed  COMPREHENSIVE METABOLIC PANEL - Abnormal; Notable for the following components:      Result Value   Sodium 129 (*)    CO2 18 (*)    Glucose, Bld 398 (*)    BUN 26 (*)    Creatinine, Ser 1.86 (*)    Albumin 3.1 (*)    GFR calc non Af Amer 30 (*)    GFR calc Af Amer 35 (*)    All other components within normal limits  CBC - Abnormal; Notable for the following components:   WBC 26.6 (*)    MCH 25.8 (*)    MCHC 29.4 (*)    Platelets 496 (*)    All other components within normal limits  CBG MONITORING, ED - Abnormal; Notable for the following components:   Glucose-Capillary 330 (*)    All other components within normal limits  CBG MONITORING, ED - Abnormal; Notable for the following components:   Glucose-Capillary 396 (*)    All other components within normal limits  RESPIRATORY PANEL BY RT PCR (FLU A&B, COVID)  CULTURE, BLOOD (ROUTINE X 2)  CULTURE, BLOOD (ROUTINE X 2)  LIPASE, BLOOD  URINALYSIS, ROUTINE W REFLEX MICROSCOPIC  LACTIC ACID, PLASMA  LACTIC ACID, PLASMA  I-STAT BETA HCG BLOOD, ED ([facility], [facility], [facility] ONLY)    EKG None  Radiology CT Abdomen Pelvis Wo Contrast  Result Date: 10/30/2019 CLINICAL DATA:  Right lower quadrant abdominal pain. EXAM: CT ABDOMEN AND PELVIS WITHOUT CONTRAST TECHNIQUE: Multidetector CT imaging of the abdomen and pelvis was performed following the standard protocol without IV contrast. COMPARISON:  March 21, 2017. FINDINGS: Lower chest: There are few small stable bibasilar pulmonary  nodules.The heart size is normal. Hepatobiliary: There is decreased hepatic attenuation suggestive of hepatic steatosis. Status post cholecystectomy.There is no biliary ductal dilation. Pancreas: Normal contours without ductal dilatation. No peripancreatic fluid collection. Spleen: No splenic laceration or hematoma.  Adrenals/Urinary Tract: --Adrenal glands: No adrenal hemorrhage. --Right kidney/ureter: No hydronephrosis or perinephric hematoma. --Left kidney/ureter: No hydronephrosis or perinephric hematoma. --Urinary bladder: Unremarkable. Stomach/Bowel: --Stomach/Duodenum: No hiatal hernia or other gastric abnormality. Normal duodenal course and caliber. --Small bowel: No dilatation or inflammation. --Colon: Findings are concerning for perforated sigmoid colitis or diverticulitis. There is wall thickening of the sigmoid colon with a large adjacent abscess superiorly measuring approximately 12 x 8 by 9.5 cm. This collection appears to extend inferiorly and cross the patient's midline to an additional collection measuring approximately 5.4 x 3.4 cm. The patient's left ovary is not reliably identified and may abut these collections. --Appendix: Normal. Vascular/Lymphatic: Atherosclerotic calcification is present within the non-aneurysmal abdominal aorta, without hemodynamically significant stenosis. --there are mildly enlarged retroperitoneal lymph nodes, presumably reactive. --there are mildly enlarged mesenteric lymph nodes, presumably reactive. --there are mildly enlarged pelvic lymph nodes, presumably reactive. Reproductive: Unremarkable Other: No ascites or free air. There is a small volume of free fluid in the patient's pelvis. Musculoskeletal. No acute displaced fractures. IMPRESSION: 1. Findings are most consistent with perforated sigmoid diverticulitis/colitis. There is a large 12 cm abscess as detailed above in addition to other complex fluid collections extending into the patient's pelvis. 2. Normal appendix in the right lower quadrant. 3. Status post cholecystectomy. Aortic Atherosclerosis (ICD10-I70.0). Electronically Signed   By: Constance Holster M.D.   On: 10/30/2019 20:07    Procedures Procedures (including critical care time)  Medications Ordered in ED Medications  sodium chloride 0.9 % bolus 1,000 mL  (has no administration in time range)  ondansetron (ZOFRAN) injection 4 mg (has no administration in time range)  HYDROmorphone (DILAUDID) injection 1 mg (has no administration in time range)  piperacillin-tazobactam (ZOSYN) IVPB 2.25 g (has no administration in time range)    ED Course  I have reviewed the triage vital signs and the nursing notes.  Pertinent labs & imaging results that were available during my care of the patient were reviewed by me and considered in my medical decision making (see chart for details).  Clinical Course as of Oct 30 2047  Sat Oct 29, 3562  896 54 year old female with complex medical history above presents with abdominal pain with nausea and constipation/now having diarrhea after taking stool softener.  On exam patient is morbidly obese, diffuse right-sided abdominal tenderness. Patient is elevated white count at 26.6, CMP with a glucose of 398 (history of diabetes) creatinine is 1.6.  Noncontrast CT abdomen pelvis due to renal function shows perforated diverticulitis with 12 cm abscess. Case discussed with Dr. Harlow Asa on-call with general surgery, will consult, recommends hospitalist to admit, patient will need IR evaluation for her abscess, recommends continue IV antibiotics (given Zosyn), hold Eliquis, do not need to reverse Eliquis at this time.   [LM]  2049 Discussed with hospitalist who will consult for admission.   [LM]    Clinical Course User Index [LM] Roque Lias   MDM Rules/Calculators/A&P                      Final Clinical Impression(s) / ED Diagnoses Final diagnoses:  Diverticulitis of large intestine with perforation and abscess without bleeding    Rx / DC Orders ED Discharge Orders    None  Tacy Learn, PA-C 10/30/19 2050    Blanchie Dessert, MD 10/31/19 1655

## 2019-10-30 NOTE — ED Triage Notes (Addendum)
Pt presents from home via [facility] with c/o generalized abdominal pain and worse pain on R side. Alert and oriented. Nausea. Constipation yesterday. Diarrhea today after taking two stool softeners. CBG 400 with ems. Pt states she has not taken her insulin since ysterday.

## 2019-10-30 NOTE — ED Notes (Signed)
Pt stopped this RN while in the waiting room. States that her sugar is dropping. Rechecked sugar. Was CBG 323

## 2019-10-30 NOTE — Progress Notes (Signed)
PHARMACY NOTE:  ANTIMICROBIAL RENAL DOSAGE ADJUSTMENT  Current antimicrobial regimen includes a mismatch between antimicrobial dosage and estimated renal function.  As per policy approved by the Pharmacy & Therapeutics and Medical Executive Committees, the antimicrobial dosage will be adjusted accordingly.  Current antimicrobial dosage:  Zosyn 2.25gm IV q6h  Indication: intra-abdominal infection  Renal Function:  SCr = 1.86                                CrCl ~ 40 ml/min     Antimicrobial dosage has been changed to:  Zosyn 3.375gm IV q8h (each dose infused over 4 hr extended interval)   Thank you for allowing pharmacy to be a part of this patient's care.  Everette Rank, [facility] 10/30/2019 9:01 PM

## 2019-10-30 NOTE — H&P (Addendum)
History and Physical    Kristen [name] JKK:[id number] DOB: April 09, 1966 DOA: 10/30/2019  PCP: Lawerance Cruel, MD  Patient coming from: Home  I have personally briefly reviewed patient's old medical records in [place]  Chief Complaint: Worsening abdominal pain  HPI: 54 year old Caucasian female with PMH of morbid obesity, type 2 diabetes mellitus, chronic pain on opioids, hypertension, depression, anxiety, asthma, s/p cholecystectomy, CVA with residual vision loss, PCOS, sleep apnea who was brought to the ED by EMS from home for worsening abdominal pain.  History obtained from the patient.  Reports abdominal pain started sometime second week of December and she was treated for a UTI then.  Denies any dysuria or increased urinary frequency at that time but says she was told her urine looked dirty.  It appears she was prescribed Macrobid but is unable to recollect the exact antibiotic.  Since her symptoms did not improve, she was again prescribed an antibiotic around Christmas which again transiently improved her symptoms.  Last week due to persistent abdominal pain, she had a televisit when she was prescribed Augmentin.  She took Augmentin for the last 1 week with no improvement in symptoms.  Notes worsening abdominal pain, poor appetite, nausea to the point that slight movement made the pain worse which prompted her to call EMS.   Generalized sharp, constant abdominal pain with most severe in the right lower quadrant, nonradiating associated with nausea and diarrhea.  Notes having 4-5 nonbloody loose watery stools for the last 3 to 4 days.  She took a stool softener few days ago thinking the pain may be due to constipation but has not taken any since then.  She had two loose stools after coming to the ED in the waiting room.  Denies any vomiting, fever.  Has history of headaches on and off, dyspnea on minimal and cough exertion at baseline. Has chronic back pain which is worse since she  has not taken her pain medications today.  Denies any dysuria or increased urinary frequency currently but says she is not passing much urine since she has not had anything to eat or drink except two bottles of water.  Denies prior history of C. difficile infection.  Last took Eliquis yesterday evening.  She has not taken any insulin today as she did not eat.  She does not remember if she took Lantus yesterday evening.  At baseline, she is usually at home and largely immobile.  She has a sister but no other family members.  Reports feeling depressed and sometimes has suicidal ideations but none currently while in the ED.  ED Course: While in the ED, she was noted to be hypertensive, tachycardic but afebrile.  Found to have leukocytosis of 26,000 and blood sugar elevated in the 300s.  CT abdomen pelvis without contrast (no contrast due to renal dysfunction) showed perforated sigmoid diverticulitis with a large 12 cm abscess in addition to complex fluid collections extending into the patient's pelvis.  ED provider spoke to surgeon on call Dr. Gala Lewandowsky who indicated no surgical need at this time and recommended IR consult for drainage of the abscess.  1 L IV fluid bolus, Zosyn, Dilaudid for pain control has been ordered by the EDP. Hospitalist called for admission.  Review of Systems: As per HPI otherwise 10 point review of systems negative.   Past Medical History:  Diagnosis Date   Anxiety    Asthma    Degenerative arthritis    in back   Depression  Diabetes mellitus    Hypertension    Migraines    Near syncope    Obesity    PCOS (polycystic ovarian syndrome)    Peripheral edema    Sleep apnea    uses cpap set on 10   Stroke [facility]) 12/2011   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     Past Surgical History:  Procedure Laterality Date   ABDOMINAL SURGERY     hernia repair   CHOLECYSTECTOMY  [id number]   COLONOSCOPY WITH PROPOFOL N/A 07/31/2016   Procedure: COLONOSCOPY WITH  PROPOFOL;  Surgeon: Arta Silence, MD;  Location: [facility] END[facility]COPY;  Service: Endoscopy;  Laterality: N/A;   FOOT SURGERY Right 06/2007   KNEE ARTHROSCOPY  09/2008   left   KNEE ARTHROSCOPY  april 2011   left   NASAL SINUS SURGERY     NASAL SINUS SURGERY  1990   SHOULDER SURGERY  2004   left     reports that she has never smoked. She has never used smokeless tobacco. She reports that she does not drink alcohol or use drugs.  Allergies  Allergen Reactions   Cafergot Other (See Comments)    Chest pain; ergotamine-caffiene   Glucophage [Metformin Hcl] Anaphylaxis   Canagliflozin Swelling and Other (See Comments)    Legs Swell invokana Legs Swell    Family History  Problem Relation Age of Onset   Hypertension Maternal Grandmother    Stroke Maternal Grandmother    Diabetes Maternal Grandfather    Cancer Mother        lung   Cancer Father    Diabetes Sister    Breast cancer Cousin    Heart attack Neg Hx      Prior to Admission medications   Medication Sig Start Date End Date Taking? Authorizing Provider  albuterol (ACCUNEB) 1.25 MG/3ML nebulizer solution Inhale 1 ampule into the lungs 3 (three) times daily as needed for wheezing or shortness of breath.  12/12/10   [provider]  albuterol (VENTOLIN HFA) 108 (90 Base) MCG/ACT inhaler Inhale 2 puffs into the lungs every 4 (four) hours as needed for wheezing or shortness of breath.  01/04/14   [provider]  apixaban (ELIQUIS) 5 MG TABS tablet Take two tabs twice a day for 7 days, then take one tab twice a day for a year. 03/24/17   Florencia Reasons, MD  aspirin 81 MG tablet Take 81 mg by mouth daily.    [provider]  atorvastatin (LIPITOR) 40 MG tablet Take 40 mg by mouth daily.    [provider]  BD PEN NEEDLE NANO U/F 32G X 4 MM MISC 2 (two) times daily. as directed 03/13/16   [provider]  buPROPion HCl (WELLBUTRIN PO) Take by mouth.    [provider]    carisoprodol (SOMA) 350 MG tablet TAKE 1 TABLET (350 MG TOTAL) BY MOUTH 3 (THREE) TIMES DAILY. 09/15/19   Meredith Staggers, MD  Cholecalciferol (VITAMIN D3) 2000 units capsule Take 2,000 Units by mouth daily.     [provider]  Fluticasone-Salmeterol (ADVAIR) 500-50 MCG/DOSE AEPB Inhale 1 puff into the lungs every 12 (twelve) hours.    [provider]  insulin aspart (NOVOLOG FLEXPEN) 100 UNIT/ML FlexPen Inject 15 Units into the skin 3 (three) times daily with meals. 03/24/17   Florencia Reasons, MD  insulin glargine (LANTUS) 100 UNIT/ML injection Inject 80-100 Units into the skin at bedtime.     [provider]  lisinopril (PRINIVIL,ZESTRIL)  20 MG tablet Take 1 tablet (20 mg total) by mouth daily. 03/24/17 05/03/19  Florencia Reasons, MD  montelukast (SINGULAIR) 10 MG tablet Take 10 mg by mouth at bedtime.     [provider]  nystatin cream (MYCOSTATIN) Apply 1 application topically 2 (two) times daily. 03/11/18   Huel Cote, NP  oxybutynin (DITROPAN) 5 MG tablet TAKE 1 TABLET BY MOUTH TWICE A DAY AS NEEDED FOR BLADDER 03/13/16   [provider]  Oxycodone HCl 10 MG TABS Take 1 tablet (10 mg total) by mouth every 8 (eight) hours as needed. 08/31/19   Bayard Hugger, NP  promethazine (PHENERGAN) 25 MG tablet Take 25 mg by mouth every 6 (six) hours as needed.     [provider]  senna-docusate (SENOKOT-S) 8.6-50 MG tablet Take 1 tablet by mouth at bedtime. 03/24/17   Florencia Reasons, MD  sertraline (ZOLOFT) 100 MG tablet Take 200 mg by mouth daily.     [provider]    Physical Exam: Vitals:   10/30/19 1243 10/30/19 1918 10/30/19 2039  BP: (!) 162/121 132/66 (!) 139/126  Pulse: (!) 104 71 91  Resp: 18 18 (!) 24  Temp: 98.5 F (36.9 C)    TempSrc: Oral    SpO2: 98% 96% 97%    Constitutional: NAD, calm, comfortable Vitals:   10/30/19 1243 10/30/19 1918 10/30/19 2039  BP: (!) 162/121 132/66 (!) 139/126  Pulse: (!) 104 71 91  Resp: 18 18 (!) 24   Temp: 98.5 F (36.9 C)    TempSrc: Oral    SpO2: 98% 96% 97%   Eyes: PERRL, lids and conjunctivae normal ENMT: Mucous membranes dry Neck: normal, supple Respiratory: clear to anterior auscultation bilaterally, no wheezing, no crackles. Normal respiratory effort. No accessory muscle use.  Cardiovascular: Regular rate and rhythm, no murmurs.  Nonpitting edema bilateral lower extremities  Abdomen: Extremely tender right lower quadrant, no rebound tenderness, bowel sounds present musculoskeletal: no clubbing / cyanosis.  Skin: Scattered rash bilateral lower extremities Neurologic: Awake, alert, oriented to time place person.  CN 2-12 grossly intact. Strength 5/5 in all 4.  Psychiatric: Depressed, denies SI  Labs on Admission: I have personally reviewed following labs and imaging studies  CBC: Recent Labs  Lab 10/30/19 1748  WBC 26.6*  HGB 12.1  HCT 41.1  MCV 87.6  PLT 939*   Basic Metabolic Panel: Recent Labs  Lab 10/30/19 1748  NA 129*  K 5.1  CL 98  CO2 18*  GLUCOSE 398*  BUN 26*  CREATININE 1.86*  CALCIUM 9.1   GFR: CrCl cannot be calculated (Unknown ideal weight.). Liver Function Tests: Recent Labs  Lab 10/30/19 1748  AST 20  ALT 39  ALKPHOS 97  BILITOT 1.2  PROT 7.5  ALBUMIN 3.1*   Recent Labs  Lab 10/30/19 1748  LIPASE 23   No results for input(s): AMMONIA in the last 168 hours. Coagulation Profile: No results for input(s): INR, PROTIME in the last 168 hours. Cardiac Enzymes: No results for input(s): CKTOTAL, CKMB, CKMBINDEX, TROPONINI in the last 168 hours. BNP (last 3 results) No results for input(s): PROBNP in the last 8760 hours. HbA1C: No results for input(s): HGBA1C in the last 72 hours. CBG: Recent Labs  Lab 10/30/19 1242 10/30/19 1550  GLUCAP 330* 396*   Lipid Profile: No results for input(s): CHOL, HDL, LDLCALC, TRIG, CHOLHDL, LDLDIRECT in the last 72 hours. Thyroid Function Tests: No results for input(s): TSH, T4TOTAL,  FREET4, T3FREE, THYROIDAB in the  last 72 hours. Anemia Panel: No results for input(s): VITAMINB12, FOLATE, FERRITIN, TIBC, IRON, RETICCTPCT in the last 72 hours. Urine analysis:    Component Value Date/Time   COLORURINE YELLOW 03/21/2017 1946   APPEARANCEUR HAZY (A) 03/21/2017 1946   LABSPEC 1.030 03/21/2017 1946   PHURINE 5.0 03/21/2017 1946   GLUCOSEU NEGATIVE 03/21/2017 1946   HGBUR NEGATIVE 03/21/2017 1946   BILIRUBINUR NEGATIVE 03/21/2017 1946   KETONESUR NEGATIVE 03/21/2017 1946   PROTEINUR NEGATIVE 03/21/2017 1946   UROBILINOGEN 0.2 11/09/2007 1105   NITRITE NEGATIVE 03/21/2017 1946   LEUKOCYTESUR NEGATIVE 03/21/2017 1946    Radiological Exams on Admission: CT Abdomen Pelvis Wo Contrast  Result Date: 10/30/2019 CLINICAL DATA:  Right lower quadrant abdominal pain. EXAM: CT ABDOMEN AND PELVIS WITHOUT CONTRAST TECHNIQUE: Multidetector CT imaging of the abdomen and pelvis was performed following the standard protocol without IV contrast. COMPARISON:  March 21, 2017. FINDINGS: Lower chest: There are few small stable bibasilar pulmonary nodules.The heart size is normal. Hepatobiliary: There is decreased hepatic attenuation suggestive of hepatic steatosis. Status post cholecystectomy.There is no biliary ductal dilation. Pancreas: Normal contours without ductal dilatation. No peripancreatic fluid collection. Spleen: No splenic laceration or hematoma. Adrenals/Urinary Tract: --Adrenal glands: No adrenal hemorrhage. --Right kidney/ureter: No hydronephrosis or perinephric hematoma. --Left kidney/ureter: No hydronephrosis or perinephric hematoma. --Urinary bladder: Unremarkable. Stomach/Bowel: --Stomach/Duodenum: No hiatal hernia or other gastric abnormality. Normal duodenal course and caliber. --Small bowel: No dilatation or inflammation. --Colon: Findings are concerning for perforated sigmoid colitis or diverticulitis. There is wall thickening of the sigmoid colon with a large adjacent abscess  superiorly measuring approximately 12 x 8 by 9.5 cm. This collection appears to extend inferiorly and cross the patient's midline to an additional collection measuring approximately 5.4 x 3.4 cm. The patient's left ovary is not reliably identified and may abut these collections. --Appendix: Normal. Vascular/Lymphatic: Atherosclerotic calcification is present within the non-aneurysmal abdominal aorta, without hemodynamically significant stenosis. --there are mildly enlarged retroperitoneal lymph nodes, presumably reactive. --there are mildly enlarged mesenteric lymph nodes, presumably reactive. --there are mildly enlarged pelvic lymph nodes, presumably reactive. Reproductive: Unremarkable Other: No ascites or free air. There is a small volume of free fluid in the patient's pelvis. Musculoskeletal. No acute displaced fractures. IMPRESSION: 1. Findings are most consistent with perforated sigmoid diverticulitis/colitis. There is a large 12 cm abscess as detailed above in addition to other complex fluid collections extending into the patient's pelvis. 2. Normal appendix in the right lower quadrant. 3. Status post cholecystectomy. Aortic Atherosclerosis (ICD10-I70.0). Electronically Signed   By: Constance Holster M.D.   On: 10/30/2019 20:07    Assessment/Plan Active Problems:   Essential hypertension   Asthma   Obesity, Class III, BMI 40-49.9 (morbid obesity) ([place])   Type 2 diabetes mellitus with stage 3 chronic kidney disease (HCC)   Diverticulitis   Abscess of sigmoid colon   History of pulmonary embolus (PE)   AKI (acute kidney injury) ([place])   Hyponatremia   Hyperglycemia  Perforated sigmoid diverticulitis complicated with a large abscess CT abdomen pelvis without contrast showed perforated sigmoid diverticulitis with large 12 cm abscess and complex fluid collections into the pelvis.  Normal appendix.  S/p cholecystectomy. EDP called on-call surgeon Dr. Gala Lewandowsky who indicated no surgical intervention  at this time and recommended IR consult on Monday. Constipation, immobility risk factors.  Given she has recently received multiple courses of antibiotics and has diarrhea, will get stool C. difficile toxin though it appears her symptoms in the first place could have  been due to diverticulitis.  Blood cultures pending. Continue Zosyn for now.  If renal function continues to worsen, can consider switching to cefepime and Flagyl. N.p.o. Pain control with as needed Dilaudid S/p 1 L IV fluid bolus in the ED.  We will continue gentle hydration. Interventional radiology needs to be consulted.  Last dose of Eliquis evening of 1/8.  Eliquis held in anticipation of procedure.  No reversal needed per surgeon.  Reported history of recent UTI ?  Dysuria, decreased urinary frequency Will get UA  AKI on CKD stage IIIa Baseline creatinine ~1.1.  Creatinine elevated to 1.86 on admission. Likely prerenal/renal in setting of poor p.o. intake/infection Renally dose all medications.  Avoid nephrotoxic drugs. Continue IV fluids.  Repeat BMP in a.m.  Hyperglycemia In setting of type 2 diabetes mellitus.  On insulin NovoLog 44 units 3 times daily with meals, insulin Lantus 80 units at bedtime at home. No recent hemoglobin A1c on chart.  One ordered for this admission. Blood glucose elevated in the 300s.  Likely in setting of infection.  Also did not take any insulin today. Bicarbonate 18 but anion gap is near normal. Beta hydroxybutyrate, VBG ordered but pending.  Do not think she is in DKA at this time. We will continue insulin Lantus 60 units at bedtime.  Hold off on mealtime insulin given she is n.p.o.  Sliding scale insulin ordered.  Blood glucose every 4 for now.  Hypoglycemia protocol in place.  Metabolic acidosis Mostly Nonanion gap.  Bicarbonate 18. Likely in setting of diarrhea.  Lactic acid pending. Repeat BMP in a.m.  Pseudo-hyponatremia Measured sodium 129.  Corrected for hyperglycemia  134.  History of pulmonary embolism On Eliquis at home.  Currently on hold in anticipation of procedure.  Hypertension Mildly elevated BP.  Pain likely contributed to elevated pressures. On lisinopril at home.  Held in setting of AKI. Monitor for now.  Pain control.  Chronic pain On oxycodone at home.  Hold as she is n.p.o. Dilaudid as needed for pain control.  Morbid obesity  Asthma Stable with no new oxygen requirement. Continue home inhalers   DVT prophylaxis: Heparin SQ (on Eliquis for PE which is on hold in anticipation of procedure, heparin subcu for now) Code Status: DNR (discussed with patient during this hospitalization, patient states she is not happy with her quality of life at baseline and would like to be DO NOT RESUSCITATE at this time) Family Communication: Discussed plan with patient Disposition Plan: Pending resolution of medical problems Consults called: General surgery called by EDP Admission status: Inpatient  Need to call IR for abscess drainage in the morning. Screening COVID-19 test pending.  Lucky Cowboy MD [facility]  If 7PM-7AM, please contact night-coverage 10/30/2019, 8:53 PM

## 2019-10-31 ENCOUNTER — Encounter (HOSPITAL_COMMUNITY): Payer: Self-pay | Admitting: Internal Medicine

## 2019-10-31 ENCOUNTER — Inpatient Hospital Stay (HOSPITAL_COMMUNITY): Payer: PPO

## 2019-10-31 DIAGNOSIS — N179 Acute kidney failure, unspecified: Secondary | ICD-10-CM

## 2019-10-31 DIAGNOSIS — K5720 Diverticulitis of large intestine with perforation and abscess without bleeding: Secondary | ICD-10-CM | POA: Diagnosis present

## 2019-10-31 DIAGNOSIS — K630 Abscess of intestine: Secondary | ICD-10-CM

## 2019-10-31 DIAGNOSIS — R739 Hyperglycemia, unspecified: Secondary | ICD-10-CM

## 2019-10-31 DIAGNOSIS — K5792 Diverticulitis of intestine, part unspecified, without perforation or abscess without bleeding: Secondary | ICD-10-CM

## 2019-10-31 LAB — GLUCOSE, CAPILLARY
Glucose-Capillary: 321 mg/dL — ABNORMAL HIGH (ref 70–99)
Glucose-Capillary: 248 mg/dL — ABNORMAL HIGH (ref 70–99)
Glucose-Capillary: 212 mg/dL — ABNORMAL HIGH (ref 70–99)
Glucose-Capillary: 118 mg/dL — ABNORMAL HIGH (ref 70–99)
Glucose-Capillary: 142 mg/dL — ABNORMAL HIGH (ref 70–99)

## 2019-10-31 LAB — HIV ANTIBODY (ROUTINE TESTING W REFLEX): HIV Screen 4th Generation wRfx: NONREACTIVE

## 2019-10-31 LAB — URINALYSIS, ROUTINE W REFLEX MICROSCOPIC
Bilirubin Urine: NEGATIVE
Glucose, UA: 50 mg/dL — AB
Hgb urine dipstick: NEGATIVE
Ketones, ur: NEGATIVE mg/dL
Nitrite: NEGATIVE
Protein, ur: 30 mg/dL — AB
Specific Gravity, Urine: 1.027 (ref 1.005–1.030)
pH: 5 (ref 5.0–8.0)

## 2019-10-31 LAB — BASIC METABOLIC PANEL
Anion gap: 10 (ref 5–15)
BUN: 36 mg/dL — ABNORMAL HIGH (ref 6–20)
CO2: 22 mmol/L (ref 22–32)
Calcium: 8.3 mg/dL — ABNORMAL LOW (ref 8.9–10.3)
Chloride: 101 mmol/L (ref 98–111)
Creatinine, Ser: 2.21 mg/dL — ABNORMAL HIGH (ref 0.44–1.00)
GFR calc Af Amer: 29 mL/min — ABNORMAL LOW (ref >60)
GFR calc non Af Amer: 25 mL/min — ABNORMAL LOW (ref >60)
Glucose, Bld: 307 mg/dL — ABNORMAL HIGH (ref 70–99)
Potassium: 4.7 mmol/L (ref 3.5–5.1)
Sodium: 133 mmol/L — ABNORMAL LOW (ref 135–145)

## 2019-10-31 LAB — CBC
HCT: 32.4 % — ABNORMAL LOW (ref 36.0–46.0)
Hemoglobin: 9.8 g/dL — ABNORMAL LOW (ref 12.0–15.0)
MCH: 26.3 pg (ref 26.0–34.0)
MCHC: 30.2 g/dL (ref 30.0–36.0)
MCV: 87.1 fL (ref 80.0–100.0)
Platelets: 356 10*3/uL (ref 150–400)
RBC: 3.72 MIL/uL — ABNORMAL LOW (ref 3.87–5.11)
RDW: 14.6 % (ref 11.5–15.5)
WBC: 18 10*3/uL — ABNORMAL HIGH (ref 4.0–10.5)
nRBC: 0.1 % (ref 0.0–0.2)

## 2019-10-31 LAB — RESPIRATORY PANEL BY RT PCR (FLU A&B, COVID)
Influenza A by PCR: NEGATIVE
Influenza B by PCR: NEGATIVE
SARS Coronavirus 2 by RT PCR: NEGATIVE

## 2019-10-31 LAB — C DIFFICILE QUICK SCREEN W PCR REFLEX
C Diff antigen: NEGATIVE
C Diff interpretation: NOT DETECTED
C Diff toxin: NEGATIVE

## 2019-10-31 LAB — OSMOLALITY: Osmolality: 293 mOsm/kg (ref 275–295)

## 2019-10-31 LAB — HEMOGLOBIN A1C
Hgb A1c MFr Bld: 8.5 % — ABNORMAL HIGH (ref 4.8–5.6)
Mean Plasma Glucose: 197.25 mg/dL

## 2019-10-31 LAB — LACTIC ACID, PLASMA: Lactic Acid, Venous: 1.4 mmol/L (ref 0.5–1.9)

## 2019-10-31 LAB — CBG MONITORING, ED: Glucose-Capillary: 370 mg/dL — ABNORMAL HIGH (ref 70–99)

## 2019-10-31 LAB — PROTIME-INR
INR: 1.4 — ABNORMAL HIGH (ref 0.8–1.2)
Prothrombin Time: 17.3 seconds — ABNORMAL HIGH (ref 11.4–15.2)

## 2019-10-31 MED ORDER — ONDANSETRON HCL 4 MG/2ML IJ SOLN
4.0000 mg | Freq: Four times a day (QID) | INTRAMUSCULAR | Status: DC | PRN
  Administered 2019-10-31 – 2019-11-07 (×17): 4 mg via INTRAVENOUS
  Filled 2019-10-31 (×19): qty 2

## 2019-10-31 MED ORDER — HYDROMORPHONE HCL 1 MG/ML IJ SOLN: 0.5000 mg | INTRAMUSCULAR | Status: DC | PRN

## 2019-10-31 MED ORDER — FENTANYL CITRATE (PF) 100 MCG/2ML IJ SOLN
INTRAMUSCULAR | Status: AC | PRN
  Administered 2019-10-31 (×2): 25 ug via INTRAVENOUS
  Administered 2019-10-31: 50 ug via INTRAVENOUS
  Administered 2019-10-31 (×4): 25 ug via INTRAVENOUS

## 2019-10-31 MED ORDER — INSULIN ASPART 100 UNIT/ML ~~LOC~~ SOLN
10.0000 [IU] | Freq: Three times a day (TID) | SUBCUTANEOUS | Status: DC
  Administered 2019-10-31 – 2019-11-12 (×28): 10 [IU] via SUBCUTANEOUS

## 2019-10-31 MED ORDER — MIDAZOLAM HCL 2 MG/2ML IJ SOLN
INTRAMUSCULAR | Status: AC | PRN
  Administered 2019-10-31 (×6): 0.5 mg via INTRAVENOUS
  Administered 2019-10-31: 1 mg via INTRAVENOUS

## 2019-10-31 MED ORDER — SODIUM CHLORIDE 0.9% FLUSH
5.0000 mL | Freq: Three times a day (TID) | INTRAVENOUS | Status: DC
  Administered 2019-10-31 – 2019-11-12 (×37): 5 mL

## 2019-10-31 MED ORDER — SODIUM CHLORIDE 0.9% FLUSH
5.0000 mL | Freq: Three times a day (TID) | INTRAVENOUS | Status: DC
  Administered 2019-10-31 – 2019-11-12 (×37): 5 mL

## 2019-10-31 MED ORDER — SODIUM CHLORIDE 0.9 % IV SOLN
INTRAVENOUS | Status: AC
  Filled 2019-10-31: qty 250

## 2019-10-31 MED ORDER — HYDROMORPHONE HCL 1 MG/ML IJ SOLN
0.5000 mg | INTRAMUSCULAR | Status: DC | PRN
  Administered 2019-10-31 – 2019-11-08 (×46): 0.5 mg via INTRAVENOUS
  Filled 2019-10-31 (×46): qty 0.5

## 2019-10-31 MED ORDER — MIDAZOLAM HCL 2 MG/2ML IJ SOLN
INTRAMUSCULAR | Status: AC
  Filled 2019-10-31: qty 4

## 2019-10-31 MED ORDER — ONDANSETRON HCL 4 MG/2ML IJ SOLN
4.0000 mg | Freq: Three times a day (TID) | INTRAMUSCULAR | Status: DC | PRN
  Administered 2019-10-31: 4 mg via INTRAVENOUS
  Filled 2019-10-31: qty 2

## 2019-10-31 MED ORDER — FENTANYL CITRATE (PF) 100 MCG/2ML IJ SOLN
INTRAMUSCULAR | Status: AC
  Filled 2019-10-31: qty 4

## 2019-10-31 NOTE — ED Notes (Signed)
ED TO INPATIENT HANDOFF REPORT  ED Nurse Name and Phone #: jon wled   S Name/Age/Gender Kristen [name] 54 y.o. female Room/Bed: WA08/WA08  Code Status   Code Status: DNR  Home/SNF/Other Home Patient oriented to: self, place, time and situation Is this baseline? Yes   Triage Complete: Triage complete  Chief Complaint Diverticulitis [K57.92]  Triage Note Pt presents from home via [facility] with c/o generalized abdominal pain and worse pain on R side. Alert and oriented. Nausea. Constipation yesterday. Diarrhea today after taking two stool softeners. CBG 400 with ems. Pt states she has not taken her insulin since ysterday.     Allergies Allergies  Allergen Reactions  . Cafergot Other (See Comments)    Chest pain; ergotamine-caffiene  . Glucophage [Metformin Hcl] Anaphylaxis  . Canagliflozin Swelling and Other (See Comments)    Legs Swell invokana Legs Swell    Level of Care/Admitting Diagnosis ED Disposition    ED Disposition Condition Comment   Admit  Hospital Area: [facility] [[id number]]  Level of Care: Telemetry [5]  Admit to tele based on following criteria: Other see comments  Comments: sepsis  Covid Evaluation: Asymptomatic Screening Protocol (No Symptoms)  Diagnosis: Diverticulitis [[id number]]  Admitting Physician: Lucky Cowboy [[id number]]  Attending Physician: Lucky Cowboy [[id number]]  Estimated length of stay: 3 - 4 days  Certification:: I certify this patient will need inpatient services for at least 2 midnights       B Medical/Surgery History Past Medical History:  Diagnosis Date  . Anxiety   . Asthma   . 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 Depression   . Diabetes mellitus   . History of pulmonary embolus (PE)   . Hypertension   . Migraines   . Near syncope   . Obesity   . PCOS (polycystic ovarian syndrome)   . Peripheral edema   . Sleep apnea    uses cpap set on 10  . 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 ABDOMINAL SURGERY     hernia repair  . CHOLECYSTECTOMY  [id number]  . COLONOSCOPY WITH PROPOFOL N/A 07/31/2016   Procedure: COLONOSCOPY WITH PROPOFOL;  Surgeon: Arta Silence, MD;  Location: [facility];  Service: Endoscopy;  Laterality: N/A;  . FOOT SURGERY Right 06/2007  . KNEE ARTHROSCOPY  09/2008   left  . KNEE ARTHROSCOPY  april 2011   left  . NASAL SINUS SURGERY    . NASAL SINUS SURGERY  1990  . SHOULDER SURGERY  2004   left     A IV Location/Drains/Wounds Patient Lines/Drains/Airways Status   Active Line/Drains/Airways    Name:   Placement date:   Placement time:   Site:   Days:   Peripheral IV 10/30/19 Left Hand   10/30/19    2200    Hand   1   Peripheral IV 10/30/19 Right Hand   10/30/19    2217    Hand   1          Intake/Output Last 24 hours No intake or output data in the 24 hours ending 10/31/19 0121  Labs/Imaging Results for orders placed or performed during the hospital encounter of 10/30/19 (from the past 48 hour(s))  CBG monitoring, ED     Status: Abnormal   Collection Time: 10/30/19 12:42 PM  Result Value Ref Range   Glucose-Capillary 330 (H) 70 -  99 mg/dL  CBG monitoring, ED     Status: Abnormal   Collection Time: 10/30/19  3:50 PM  Result Value Ref Range   Glucose-Capillary 396 (H) 70 - 99 mg/dL  Lipase, blood     Status: None   Collection Time: 10/30/19  5:48 PM  Result Value Ref Range   Lipase 23 11 - 51 U/L    Comment: Performed at [facility], [place] [address] [place], [place] [address]  Comprehensive metabolic panel     Status: Abnormal   Collection Time: 10/30/19  5:48 PM  Result Value Ref Range   Sodium 129 (L) 135 - 145 mmol/L   Potassium 5.1 3.5 - 5.1 mmol/L   Chloride 98 98 - 111 mmol/L   CO2 18 (L) 22 - 32 mmol/L   Glucose, Bld 398 (H) 70 - 99 mg/dL   BUN 26 (H) 6 - 20 mg/dL   Creatinine, Ser 1.86 (H) 0.44 - 1.00 mg/dL   Calcium 9.1 8.9 - 10.3 mg/dL    Total Protein 7.5 6.5 - 8.1 g/dL   Albumin 3.1 (L) 3.5 - 5.0 g/dL   AST 20 15 - 41 U/L   ALT 39 0 - 44 U/L   Alkaline Phosphatase 97 38 - 126 U/L   Total Bilirubin 1.2 0.3 - 1.2 mg/dL   GFR calc non Af Amer 30 (L) >60 mL/min   GFR calc Af Amer 35 (L) >60 mL/min   Anion gap 13 5 - 15    Comment: Performed at [facility], [place] [address], [place], [place] [address]  CBC     Status: Abnormal   Collection Time: 10/30/19  5:48 PM  Result Value Ref Range   WBC 26.6 (H) 4.0 - 10.5 K/uL   RBC 4.69 3.87 - 5.11 MIL/uL   Hemoglobin 12.1 12.0 - 15.0 g/dL   HCT 41.1 36.0 - 46.0 %   MCV 87.6 80.0 - 100.0 fL   MCH 25.8 (L) 26.0 - 34.0 pg   MCHC 29.4 (L) 30.0 - 36.0 g/dL   RDW 14.4 11.5 - 15.5 %   Platelets 496 (H) 150 - 400 K/uL   nRBC 0.2 0.0 - 0.2 %    Comment: Performed at [facility], [place] [address], [place], [place] [address]  I-Stat beta hCG blood, ED     Status: None   Collection Time: 10/30/19  5:54 PM  Result Value Ref Range   I-stat hCG, quantitative <5.0 <5 mIU/mL   Comment 3            Comment:   GEST. AGE      CONC.  (mIU/mL)   <=1 WEEK        5 - 50     2 WEEKS       50 - 500     3 WEEKS       100 - 10,000     4 WEEKS     1,000 - 30,000        FEMALE AND NON-PREGNANT FEMALE:     LESS THAN 5 mIU/mL   Lactic acid, plasma     Status: None   Collection Time: 10/30/19  7:16 PM  Result Value Ref Range   Lactic Acid, Venous 1.3 0.5 - 1.9 mmol/L    Comment: Performed at [facility], [place] [address], [place],  [address]  Respiratory Panel by RT PCR (Flu A&B, Covid) - Nasopharyngeal Swab  Status: None   Collection Time: 10/30/19  7:25 PM   Specimen: Nasopharyngeal Swab  Result Value Ref Range   SARS Coronavirus 2 by RT PCR NEGATIVE NEGATIVE    Comment: (NOTE) SARS-CoV-2 target nucleic acids are NOT DETECTED. The SARS-CoV-2 RNA is generally detectable in upper respiratoy specimens during the acute phase of  infection. The lowest concentration of SARS-CoV-2 viral copies this assay can detect is 131 copies/mL. A negative result does not preclude SARS-Cov-2 infection and should not be used as the sole basis for treatment or other patient management decisions. A negative result may occur with  improper specimen collection/handling, submission of specimen other than nasopharyngeal swab, presence of viral mutation(s) within the areas targeted by this assay, and inadequate number of viral copies (<131 copies/mL). A negative result must be combined with clinical observations, patient history, and epidemiological information. The expected result is Negative. Fact Sheet for Patients:  PinkCheek.be Fact Sheet for Healthcare Providers:  GravelBags.it This test is not yet ap proved or cleared by the [place] FDA and  has been authorized for detection and/or diagnosis of SARS-CoV-2 by FDA under an Emergency Use Authorization (EUA). This EUA will remain  in effect (meaning this test can be used) for the duration of the COVID-19 declaration under Section 564(b)(1) of the Act, 21 U.S.C. section 360bbb-3(b)(1), unless the authorization is terminated or revoked sooner.    Influenza A by PCR NEGATIVE NEGATIVE   Influenza B by PCR NEGATIVE NEGATIVE    Comment: (NOTE) The Xpert Xpress SARS-CoV-2/FLU/RSV assay is intended as an aid in  the diagnosis of influenza from Nasopharyngeal swab specimens and  should not be used as a sole basis for treatment. Nasal washings and  aspirates are unacceptable for Xpert Xpress SARS-CoV-2/FLU/RSV  testing. Fact Sheet for Patients: PinkCheek.be Fact Sheet for Healthcare Providers: GravelBags.it This test is not yet approved or cleared by the [place] FDA and  has been authorized for detection and/or diagnosis of SARS-CoV-2 by  FDA under an Emergency  Use Authorization (EUA). This EUA will remain  in effect (meaning this test can be used) for the duration of the  Covid-19 declaration under Section 564(b)(1) of the Act, 21  U.S.C. section 360bbb-3(b)(1), unless the authorization is  terminated or revoked. Performed at [facility], [place] [address], [place], [place] [address]   Beta-hydroxybutyric acid     Status: Abnormal   Collection Time: 10/30/19  9:44 PM  Result Value Ref Range   Beta-Hydroxybutyric Acid 2.58 (H) 0.05 - 0.27 mmol/L    Comment: Performed at [name], [place] [address] [place], [place] [address]  Blood gas, venous     Status: Abnormal   Collection Time: 10/30/19  9:44 PM  Result Value Ref Range   pH, Ven 7.349 7.250 - 7.430   pCO2, Ven 35.1 (L) 44.0 - 60.0 mmHg   pO2, Ven 57.1 (H) 32.0 - 45.0 mmHg   Bicarbonate 18.8 (L) 20.0 - 28.0 mmol/L   Acid-base deficit 5.6 (H) 0.0 - 2.0 mmol/L   O2 Saturation 86.9 %   Patient temperature 98.6     Comment: Performed at [facility], [place] [address], [place], [place] [address]  POC CBG, ED     Status: Abnormal   Collection Time: 10/30/19 11:46 PM  Result Value Ref Range   Glucose-Capillary 412 (H) 70 - 99 mg/dL   CT Abdomen Pelvis Wo Contrast  Result Date: 10/30/2019 CLINICAL DATA:  Right lower quadrant abdominal pain.  EXAM: CT ABDOMEN AND PELVIS WITHOUT CONTRAST TECHNIQUE: Multidetector CT imaging of the abdomen and pelvis was performed following the standard protocol without IV contrast. COMPARISON:  March 21, 2017. FINDINGS: Lower chest: There are few small stable bibasilar pulmonary nodules.The heart size is normal. Hepatobiliary: There is decreased hepatic attenuation suggestive of hepatic steatosis. Status post cholecystectomy.There is no biliary ductal dilation. Pancreas: Normal contours without ductal dilatation. No peripancreatic fluid collection. Spleen: No splenic laceration or hematoma. Adrenals/Urinary Tract:  --Adrenal glands: No adrenal hemorrhage. --Right kidney/ureter: No hydronephrosis or perinephric hematoma. --Left kidney/ureter: No hydronephrosis or perinephric hematoma. --Urinary bladder: Unremarkable. Stomach/Bowel: --Stomach/Duodenum: No hiatal hernia or other gastric abnormality. Normal duodenal course and caliber. --Small bowel: No dilatation or inflammation. --Colon: Findings are concerning for perforated sigmoid colitis or diverticulitis. There is wall thickening of the sigmoid colon with a large adjacent abscess superiorly measuring approximately 12 x 8 by 9.5 cm. This collection appears to extend inferiorly and cross the patient's midline to an additional collection measuring approximately 5.4 x 3.4 cm. The patient's left ovary is not reliably identified and may abut these collections. --Appendix: Normal. Vascular/Lymphatic: Atherosclerotic calcification is present within the non-aneurysmal abdominal aorta, without hemodynamically significant stenosis. --there are mildly enlarged retroperitoneal lymph nodes, presumably reactive. --there are mildly enlarged mesenteric lymph nodes, presumably reactive. --there are mildly enlarged pelvic lymph nodes, presumably reactive. Reproductive: Unremarkable Other: No ascites or free air. There is a small volume of free fluid in the patient's pelvis. Musculoskeletal. No acute displaced fractures. IMPRESSION: 1. Findings are most consistent with perforated sigmoid diverticulitis/colitis. There is a large 12 cm abscess as detailed above in addition to other complex fluid collections extending into the patient's pelvis. 2. Normal appendix in the right lower quadrant. 3. Status post cholecystectomy. Aortic Atherosclerosis (ICD10-I70.0). Electronically Signed   By: Constance Holster M.D.   On: 10/30/2019 20:07    Pending Labs Unresulted Labs (From admission, onward)    Start     Ordered   10/31/19 3500  Basic metabolic panel  Tomorrow morning,   R     10/30/19 2143    10/31/19 0500  CBC  Tomorrow morning,   R     10/30/19 2143   10/30/19 2233  Osmolality  Add-on,   AD     10/30/19 2232   10/30/19 2144  HIV Antibody (routine testing w rflx)  (HIV Antibody (Routine testing w reflex) panel)  Once,   STAT     10/30/19 2143   10/30/19 2144  C difficile quick scan w PCR reflex  (C Difficile quick screen w PCR reflex panel)  Once, for 24 hours,   STAT     10/30/19 2143   10/30/19 2144  Hemoglobin A1c  Add-on,   AD     10/30/19 2143   10/30/19 2013  Culture, blood (routine x 2)  BLOOD CULTURE X 2,   STAT     10/30/19 2012   10/30/19 1916  Lactic acid, plasma  Now then every 2 hours,   STAT     10/30/19 1916   10/30/19 1240  Urinalysis, Routine w reflex microscopic  ONCE - STAT,   STAT     10/30/19 1239          Vitals/Pain Today's Vitals   10/30/19 2232 10/30/19 2256 10/30/19 2313 10/31/19 0017  BP: 125/71   (!) 134/57  Pulse: 90  90 90  Resp: 20  18 20   Temp: 98.6 F (37 C)  98.3 F (36.8 C) 98.3  F (36.8 C)  TempSrc: Oral  Oral   SpO2: 96%   96%  PainSc: 9  6   8      Isolation Precautions Enteric precautions (UV disinfection)  Medications Medications  piperacillin-tazobactam (ZOSYN) IVPB 3.375 g (0 g Intravenous Stopped 10/30/19 2342)  atorvastatin (LIPITOR) tablet 40 mg (has no administration in time range)  sertraline (ZOLOFT) tablet 200 mg (has no administration in time range)  Vitamin D3 2,000 Units (has no administration in time range)  albuterol (ACCUNEB) nebulizer solution 1.25 mg (has no administration in time range)  mometasone-formoterol (DULERA) 200-5 MCG/ACT inhaler 2 puff (has no administration in time range)  montelukast (SINGULAIR) tablet 10 mg (has no administration in time range)  nystatin cream (MYCOSTATIN) 1 application (has no administration in time range)  heparin injection 5,000 Units (has no administration in time range)  sodium chloride flush (NS) 0.9 % injection 3 mL (3 mLs Intravenous Given 10/30/19 2232)   HYDROmorphone (DILAUDID) injection 0.5 mg (has no administration in time range)  acetaminophen (TYLENOL) tablet 650 mg (has no administration in time range)  insulin glargine (LANTUS) injection 60 Units (60 Units Subcutaneous Given 10/31/19 0012)  insulin aspart (novoLOG) injection 0-20 Units (20 Units Subcutaneous Given 10/31/19 0014)  0.9 %  sodium chloride infusion ( Intravenous New Bag/Given 10/31/19 0020)  insulin aspart (novoLOG) injection 15 Units (has no administration in time range)  sodium chloride 0.9 % bolus 1,000 mL (0 mLs Intravenous Stopped 10/31/19 0019)  ondansetron (ZOFRAN) injection 4 mg (4 mg Intravenous Given 10/30/19 2219)  HYDROmorphone (DILAUDID) injection 1 mg (1 mg Intravenous Given 10/30/19 2219)    Mobility non-ambulatory Low fall risk   Focused Assessments   R Recommendations: See Admitting Provider Note  Report given to:   Additional Notes:  Note NPO abscess within stomach, infection

## 2019-10-31 NOTE — Procedures (Signed)
    Interventional Radiology Procedure:   Indications: Intra-abdominal abscesses, presumed diverticular abscesses  Procedure: CT guided abscess drains x 3  Findings: Drain #1 in left lower abdominal abscess (lateral) and may involve left ovary and adnexa.  25 ml of yellow purulent fluid removed.  Drain #2 in left lower abdominal abscess (medial) that contains gas and removed 60 ml of brown purulent fluid.  Drain #3 in right lower abdomen removed 530 ml of cloudy yellow fluid.   Complications: None     EBL: less than 10 ml  Plan: Send fluid from Drain #1 and #2 for culture.  Follow outputs.     Franz Svec R. Anselm Pancoast, MD  Pager: [phone]

## 2019-10-31 NOTE — Progress Notes (Signed)
Pt currently in IR

## 2019-10-31 NOTE — Progress Notes (Addendum)
PROGRESS NOTE    Kristen [name]  IWP:[id number] DOB: 30-Oct-1965 DOA: 10/30/2019 PCP: Lawerance Cruel, MD   Brief Narrative:  H&P per Dr. Posey Pronto: 54 year old Caucasian female with PMH of morbid obesity, type 2 diabetes mellitus, chronic pain on opioids, hypertension, depression, anxiety, asthma, s/p cholecystectomy, CVA with residual vision loss, PCOS, sleep apnea who was brought to the ED by EMS from home for worsening abdominal pain.  Reports abdominal pain started sometime second week of December and she was treated for a UTI then. Generalized sharp, constant abdominal pain with most severe in the right lower quadrant, nonradiating associated with nausea and diarrhea.  Notes having 4-5 nonbloody loose watery stools for the last 3 to 4 days. She had two loose stools after coming to the ED in the waiting room.  Denies any vomiting, fever.   Last took Eliquis yesterday evening. At baseline, she is usually at home and largely immobile.  She has a sister but no other family members.  Reports feeling depressed and sometimes has suicidal ideations but none currently while in the ED.  ED Course: While in the ED, she was noted to be hypertensive, tachycardic but afebrile. Found to have leukocytosis of 26,000 and blood sugar elevated in the 300s.  CT abdomen pelvis without contrast (no contrast due to renal dysfunction) showed perforated sigmoid diverticulitis with a large 12 cm abscess in addition to complex fluid collections extending into the patient's pelvis.  ED provider spoke to surgeon on call Dr. Gala Lewandowsky who indicated no surgical need at this time and recommended IR consult for drainage of the abscess.  Assessment & Plan:   Principal Problem:   Diverticulitis of large intestine with abscess Active Problems:   Essential hypertension   Asthma   Obesity, Class III, BMI 40-49.9 (morbid obesity) (HCC)   Type 2 diabetes mellitus with stage 3 chronic kidney disease (HCC)   Diverticulitis  Abscess of sigmoid colon   History of pulmonary embolus (PE)   AKI (acute kidney injury) ([place])   Hyponatremia   Hyperglycemia   Perforated sigmoid diverticulitis complicated with a large abscess CT abdomen pelvis without contrast showed perforated sigmoid diverticulitis with large 12 cm abscess and complex fluid collections into the pelvis.  -C. difficile toxin Negative - Blood cultures pending. - Continue Zosyn for now. - N.p.o., consider diet after IR procedure - Surgical consult, apprec recs - VIR consult to for abscess drainage, apprec recs - Pain control with as needed Dilaudid - Continue fluids - Consider AC after no more procedures necessary  Reported history of recent UTI - UA, urine culture - Continue ABX  AKI on CKD stage IIIa Baseline creatinine ~1.1.  Creatinine elevated to 1.86 on admission. Likely prerenal/renal in setting of poor p.o. intake/infection - Renally dose all medications.  Avoid nephrotoxic drugs. - Continue IV fluids.   - Monitor wi daily bmp  Hyperglycemia In setting of type 2 diabetes mellitus.  On insulin NovoLog 44 units 3 times daily with meals, insulin Lantus 80 units at bedtime at home. - hemo a1c - Lantus 60 units at bedtime - Hold off on mealtime insulin given she is n.p.o.   - Sliding scale insulin ordered - Hypoglycemia protocol in place.  Metabolic acidosis Mostly Nonanion gap.  Bicarbonate 18. - Likely in setting of diarrhea.  - Repeat BMP in a.m.  Pseudo-hyponatremia Measured sodium 129.  Corrected for hyperglycemia 134.  History of pulmonary embolism On Eliquis at home.  Currently on hold in anticipation of procedure. -  likely restart tomorrow pending no further procedures  Hypertension - On lisinopril at home.  Held in setting of AKI. - Monitor for now.  Pain control.  Chronic pain On oxycodone at home.  Hold as she is n.p.o. - Dilaudid as needed for pain control.  Morbid obesity -  Monitor  Asthma Stable with no new oxygen requirement. - Continue home inhalers   DVT prophylaxis: Heparin SQ (on Eliquis for PE which is on hold in anticipation of procedure, heparin subcu for now) Code Status: DNR (discussed with patient during this hospitalization, patient states she is not happy with her quality of life at baseline and would like to be DO NOT RESUSCITATE at this time) Family Communication: Discussed plan with patient Disposition Plan: Pending resolution of medical problems Consults called: General surgery called by EDP, VIR consult 1/10 Admission status: Inpatient   Subjective: No acute events overnight. Complains of extensive pain. Medications help, but wear off too quickly. Patient NPO but would like ice chips. Plan for possible VIR intervention today. Having nausea as well.  Objective: Vitals:   10/31/19 0154 10/31/19 0200 10/31/19 0514 10/31/19 1355  BP: (!) 137/55  (!) 100/58 (!) 104/58  Pulse: 93  92 98  Resp: 18  19 20   Temp: 98.9 F (37.2 C)  98.7 F (37.1 C)   TempSrc: Oral  Oral   SpO2: 96%  100% 96%  Weight:  (!) 175.8 kg    Height:  5\' 8"  (1.727 m)      Intake/Output Summary (Last 24 hours) at 10/31/2019 1359 Last data filed at 10/31/2019 0600 Gross per 24 hour  Intake 359.33 ml  Output --  Net 359.33 ml   Filed Weights   10/31/19 0200  Weight: (!) 175.8 kg    Examination:  General exam: Obese, Appears calm but in pain and uncomfortable  Respiratory system: Clear to auscultation. Respiratory effort normal. Cardiovascular system: S1 & S2 heard, RRR. No pedal edema. Gastrointestinal system: Abdomen is nondistended, rigid and very tender. No organomegaly or masses felt. Normal bowel sounds heard. Passing BMs.  Central nervous system: Alert and oriented. No focal neurological deficits. Extremities: Symmetric 5 x 5 power. Skin: No rashes, lesions or ulcers Psychiatry: Judgement and insight appear normal. Mood & affect appropriate.     Data Reviewed: I have personally reviewed following labs and imaging studies  CBC: Recent Labs  Lab 10/30/19 1748 10/31/19 0558  WBC 26.6* 18.0*  HGB 12.1 9.8*  HCT 41.1 32.4*  MCV 87.6 87.1  PLT 496* 154   Basic Metabolic Panel: Recent Labs  Lab 10/30/19 1748 10/31/19 0558  NA 129* 133*  K 5.1 4.7  CL 98 101  CO2 18* 22  GLUCOSE 398* 307*  BUN 26* 36*  CREATININE 1.86* 2.21*  CALCIUM 9.1 8.3*   GFR: Estimated Creatinine Clearance: 50.5 mL/min (A) (by C-G formula based on SCr of 2.21 mg/dL (H)). Liver Function Tests: Recent Labs  Lab 10/30/19 1748  AST 20  ALT 39  ALKPHOS 97  BILITOT 1.2  PROT 7.5  ALBUMIN 3.1*   Recent Labs  Lab 10/30/19 1748  LIPASE 23   No results for input(s): AMMONIA in the last 168 hours. Coagulation Profile: Recent Labs  Lab 10/31/19 1056  INR 1.4*   Cardiac Enzymes: No results for input(s): CKTOTAL, CKMB, CKMBINDEX, TROPONINI in the last 168 hours. BNP (last 3 results) No results for input(s): PROBNP in the last 8760 hours. HbA1C: Recent Labs    10/30/19 2144  HGBA1C 8.5*  CBG: Recent Labs  Lab 10/30/19 2346 10/31/19 0126 10/31/19 0417 10/31/19 0752 10/31/19 1146  GLUCAP 412* 370* 321* 248* 212*   Lipid Profile: No results for input(s): CHOL, HDL, LDLCALC, TRIG, CHOLHDL, LDLDIRECT in the last 72 hours. Thyroid Function Tests: No results for input(s): TSH, T4TOTAL, FREET4, T3FREE, THYROIDAB in the last 72 hours. Anemia Panel: No results for input(s): VITAMINB12, FOLATE, FERRITIN, TIBC, IRON, RETICCTPCT in the last 72 hours. Sepsis Labs: Recent Labs  Lab 10/30/19 1916 10/31/19 0558  LATICACIDVEN 1.3 1.4    Recent Results (from the past 240 hour(s))  Respiratory Panel by RT PCR (Flu A&B, Covid) - Nasopharyngeal Swab     Status: None   Collection Time: 10/30/19  7:25 PM   Specimen: Nasopharyngeal Swab  Result Value Ref Range Status   SARS Coronavirus 2 by RT PCR NEGATIVE NEGATIVE Final    Comment:  (NOTE) SARS-CoV-2 target nucleic acids are NOT DETECTED. The SARS-CoV-2 RNA is generally detectable in upper respiratoy specimens during the acute phase of infection. The lowest concentration of SARS-CoV-2 viral copies this assay can detect is 131 copies/mL. A negative result does not preclude SARS-Cov-2 infection and should not be used as the sole basis for treatment or other patient management decisions. A negative result may occur with  improper specimen collection/handling, submission of specimen other than nasopharyngeal swab, presence of viral mutation(s) within the areas targeted by this assay, and inadequate number of viral copies (<131 copies/mL). A negative result must be combined with clinical observations, patient history, and epidemiological information. The expected result is Negative. Fact Sheet for Patients:  PinkCheek.be Fact Sheet for Healthcare Providers:  GravelBags.it This test is not yet ap proved or cleared by the [place] FDA and  has been authorized for detection and/or diagnosis of SARS-CoV-2 by FDA under an Emergency Use Authorization (EUA). This EUA will remain  in effect (meaning this test can be used) for the duration of the COVID-19 declaration under Section 564(b)(1) of the Act, 21 U.S.C. section 360bbb-3(b)(1), unless the authorization is terminated or revoked sooner.    Influenza A by PCR NEGATIVE NEGATIVE Final   Influenza B by PCR NEGATIVE NEGATIVE Final    Comment: (NOTE) The Xpert Xpress SARS-CoV-2/FLU/RSV assay is intended as an aid in  the diagnosis of influenza from Nasopharyngeal swab specimens and  should not be used as a sole basis for treatment. Nasal washings and  aspirates are unacceptable for Xpert Xpress SARS-CoV-2/FLU/RSV  testing. Fact Sheet for Patients: PinkCheek.be Fact Sheet for Healthcare  Providers: GravelBags.it This test is not yet approved or cleared by the [place] FDA and  has been authorized for detection and/or diagnosis of SARS-CoV-2 by  FDA under an Emergency Use Authorization (EUA). This EUA will remain  in effect (meaning this test can be used) for the duration of the  Covid-19 declaration under Section 564(b)(1) of the Act, 21  U.S.C. section 360bbb-3(b)(1), unless the authorization is  terminated or revoked. Performed at [facility], [place] [address], [place], [place] [address]   C difficile quick scan w PCR reflex     Status: None   Collection Time: 10/31/19  8:16 AM   Specimen: STOOL  Result Value Ref Range Status   C Diff antigen NEGATIVE NEGATIVE Final   C Diff toxin NEGATIVE NEGATIVE Final   C Diff interpretation No C. difficile detected.  Final    Comment: Performed at [facility], [place] [address], [place], [place] [address]  Radiology Studies: CT Abdomen Pelvis Wo Contrast  Result Date: 10/30/2019 CLINICAL DATA:  Right lower quadrant abdominal pain. EXAM: CT ABDOMEN AND PELVIS WITHOUT CONTRAST TECHNIQUE: Multidetector CT imaging of the abdomen and pelvis was performed following the standard protocol without IV contrast. COMPARISON:  March 21, 2017. FINDINGS: Lower chest: There are few small stable bibasilar pulmonary nodules.The heart size is normal. Hepatobiliary: There is decreased hepatic attenuation suggestive of hepatic steatosis. Status post cholecystectomy.There is no biliary ductal dilation. Pancreas: Normal contours without ductal dilatation. No peripancreatic fluid collection. Spleen: No splenic laceration or hematoma. Adrenals/Urinary Tract: --Adrenal glands: No adrenal hemorrhage. --Right kidney/ureter: No hydronephrosis or perinephric hematoma. --Left kidney/ureter: No hydronephrosis or perinephric hematoma. --Urinary bladder: Unremarkable. Stomach/Bowel:  --Stomach/Duodenum: No hiatal hernia or other gastric abnormality. Normal duodenal course and caliber. --Small bowel: No dilatation or inflammation. --Colon: Findings are concerning for perforated sigmoid colitis or diverticulitis. There is wall thickening of the sigmoid colon with a large adjacent abscess superiorly measuring approximately 12 x 8 by 9.5 cm. This collection appears to extend inferiorly and cross the patient's midline to an additional collection measuring approximately 5.4 x 3.4 cm. The patient's left ovary is not reliably identified and may abut these collections. --Appendix: Normal. Vascular/Lymphatic: Atherosclerotic calcification is present within the non-aneurysmal abdominal aorta, without hemodynamically significant stenosis. --there are mildly enlarged retroperitoneal lymph nodes, presumably reactive. --there are mildly enlarged mesenteric lymph nodes, presumably reactive. --there are mildly enlarged pelvic lymph nodes, presumably reactive. Reproductive: Unremarkable Other: No ascites or free air. There is a small volume of free fluid in the patient's pelvis. Musculoskeletal. No acute displaced fractures. IMPRESSION: 1. Findings are most consistent with perforated sigmoid diverticulitis/colitis. There is a large 12 cm abscess as detailed above in addition to other complex fluid collections extending into the patient's pelvis. 2. Normal appendix in the right lower quadrant. 3. Status post cholecystectomy. Aortic Atherosclerosis (ICD10-I70.0). Electronically Signed   By: Constance Holster M.D.   On: 10/30/2019 20:07        Scheduled Meds: . atorvastatin  40 mg Oral Daily  . cholecalciferol  2,000 Units Oral Daily  . fentaNYL      . heparin  5,000 Units Subcutaneous Q8H  . insulin aspart  0-20 Units Subcutaneous Q4H  . insulin aspart  15 Units Subcutaneous Q4H  . insulin glargine  60 Units Subcutaneous QHS  . midazolam      . mometasone-formoterol  2 puff Inhalation BID  .  montelukast  10 mg Oral QHS  . sertraline  200 mg Oral Daily  . sodium chloride flush  3 mL Intravenous Q12H   Continuous Infusions: . sodium chloride 75 mL/hr at 10/31/19 0205  . piperacillin-tazobactam (ZOSYN)  IV 3.375 g (10/31/19 0551)  . sodium chloride       LOS: 1 day    Time spent: 30 minutes    Arma Heading, MD [facility]  If 7PM-7AM, please contact night-coverage [email or web address] Password [facility] 10/31/2019, 1:59 PM

## 2019-10-31 NOTE — Plan of Care (Signed)
  Problem: Pain Managment: Goal: General experience of comfort will improve Outcome: Progressing   

## 2019-10-31 NOTE — Progress Notes (Signed)
Patient refuses cpap for tonight 

## 2019-10-31 NOTE — Progress Notes (Signed)
Writer has received report from IR nurse. Pt has arrived back to Room 1515. Alert. Sister of pt in room.

## 2019-10-31 NOTE — Consult Note (Signed)
General Surgery [facility]  Reason for Consult: diverticulitis with abscess  Referring Physician: Dr. Dessie Coma, [facility]  Primary MD: Dr. Harrington Challenger, [place]  Kristen [name] is an 54 y.o. female.  HPI: Patient is a 54 year old female admitted to the medical service with acute diverticulitis with abscess formation.  Patient has had abdominal pain for approximately 1 month.  She has been evaluated intermittently and treated with multiple courses of oral antibiotics as an outpatient.  Pain has progressively become worse.  Yesterday she was brought to the emergency department by EMS for evaluation.  Laboratory studies showed an elevated white blood cell count of 26,000.  CT scan of abdomen and pelvis showed findings consistent with diverticulitis with a large abscess extending into the pelvis.  Patient was admitted to the medical service for management.  Patient has multiple medical problems including diabetes, hypertension, morbid obesity, sleep apnea, and asthma.  Patient has a history of deep venous thrombosis and pulmonary embolism and is chronically anticoagulated.  Past surgical history is notable for cholecystectomy and umbilical hernia repair.  Patient does not give a prior history of diverticular disease.  Patient has been admitted to the medical service.  She is seen and evaluated on floor 5 E. at [facility].  I have reviewed her laboratory studies and her CT scan personally.  Past Medical History:  Diagnosis Date  . Anxiety   . Asthma   . 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 Depression   . Diabetes mellitus   . History of pulmonary embolus (PE)   . Hypertension   . Migraines   . Near syncope   . Obesity   . PCOS (polycystic ovarian syndrome)   . Peripheral edema   . Sleep apnea    uses cpap set on 10  . 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 ABDOMINAL SURGERY      hernia repair  . CHOLECYSTECTOMY  [id number]  . COLONOSCOPY WITH PROPOFOL N/A 07/31/2016   Procedure: COLONOSCOPY WITH PROPOFOL;  Surgeon: Arta Silence, MD;  Location: [facility]COPY;  Service: Endoscopy;  Laterality: N/A;  . FOOT SURGERY Right 06/2007  . KNEE ARTHROSCOPY  09/2008   left  . KNEE ARTHROSCOPY  april 2011   left  . NASAL SINUS SURGERY    . NASAL SINUS SURGERY  1990  . SHOULDER SURGERY  2004   left    Family History  Problem Relation Age of Onset  . Hypertension Maternal Grandmother   . Stroke Maternal Grandmother   . Diabetes Maternal Grandfather   . Cancer Mother        lung  . Cancer Father   . Diabetes Sister   . Breast cancer Cousin   . Heart attack Neg Hx     Social History:  reports that she has never smoked. She has never used smokeless tobacco. She reports that she does not drink alcohol or use drugs.  Allergies:  Allergies  Allergen Reactions  . Cafergot Other (See Comments)    Chest pain; ergotamine-caffiene  . Glucophage [Metformin Hcl] Anaphylaxis  . Canagliflozin Swelling and Other (See Comments)    Legs Swell invokana Legs Swell    Medications: I have reviewed the patient's current medications.  Results for orders placed or performed during the hospital encounter of 10/30/19 (from the past 48 hour(s))  CBG monitoring, ED  Status: Abnormal   Collection Time: 10/30/19 12:42 PM  Result Value Ref Range   Glucose-Capillary 330 (H) 70 - 99 mg/dL  CBG monitoring, ED     Status: Abnormal   Collection Time: 10/30/19  3:50 PM  Result Value Ref Range   Glucose-Capillary 396 (H) 70 - 99 mg/dL  Lipase, blood     Status: None   Collection Time: 10/30/19  5:48 PM  Result Value Ref Range   Lipase 23 11 - 51 U/L    Comment: Performed at [facility], [place] [address], [place], [place] [address]  Comprehensive metabolic panel     Status: Abnormal   Collection Time: 10/30/19  5:48 PM  Result Value Ref Range   Sodium 129 (L) 135  - 145 mmol/L   Potassium 5.1 3.5 - 5.1 mmol/L   Chloride 98 98 - 111 mmol/L   CO2 18 (L) 22 - 32 mmol/L   Glucose, Bld 398 (H) 70 - 99 mg/dL   BUN 26 (H) 6 - 20 mg/dL   Creatinine, Ser 1.86 (H) 0.44 - 1.00 mg/dL   Calcium 9.1 8.9 - 10.3 mg/dL   Total Protein 7.5 6.5 - 8.1 g/dL   Albumin 3.1 (L) 3.5 - 5.0 g/dL   AST 20 15 - 41 U/L   ALT 39 0 - 44 U/L   Alkaline Phosphatase 97 38 - 126 U/L   Total Bilirubin 1.2 0.3 - 1.2 mg/dL   GFR calc non Af Amer 30 (L) >60 mL/min   GFR calc Af Amer 35 (L) >60 mL/min   Anion gap 13 5 - 15    Comment: Performed at [name], [place] [address] [place], [place] [address]  CBC     Status: Abnormal   Collection Time: 10/30/19  5:48 PM  Result Value Ref Range   WBC 26.6 (H) 4.0 - 10.5 K/uL   RBC 4.69 3.87 - 5.11 MIL/uL   Hemoglobin 12.1 12.0 - 15.0 g/dL   HCT 41.1 36.0 - 46.0 %   MCV 87.6 80.0 - 100.0 fL   MCH 25.8 (L) 26.0 - 34.0 pg   MCHC 29.4 (L) 30.0 - 36.0 g/dL   RDW 14.4 11.5 - 15.5 %   Platelets 496 (H) 150 - 400 K/uL   nRBC 0.2 0.0 - 0.2 %    Comment: Performed at [name], [place] [address], [place],  [address]  I-Stat beta hCG blood, ED     Status: None   Collection Time: 10/30/19  5:54 PM  Result Value Ref Range   I-stat hCG, quantitative <5.0 <5 mIU/mL   Comment 3            Comment:   GEST. AGE      CONC.  (mIU/mL)   <=1 WEEK        5 - 50     2 WEEKS       50 - 500     3 WEEKS       100 - 10,000     4 WEEKS     1,000 - 30,000        FEMALE AND NON-PREGNANT FEMALE:     LESS THAN 5 mIU/mL   Lactic acid, plasma     Status: None   Collection Time: 10/30/19  7:16 PM  Result Value Ref Range   Lactic Acid, Venous 1.3 0.5 - 1.9 mmol/L    Comment: Performed at [facility], [place]  [address], [place], [place] [address]  Respiratory Panel by RT PCR (Flu A&B, Covid) - Nasopharyngeal Swab     Status: None   Collection Time: 10/30/19  7:25 PM   Specimen: Nasopharyngeal Swab   Result Value Ref Range   SARS Coronavirus 2 by RT PCR NEGATIVE NEGATIVE    Comment: (NOTE) SARS-CoV-2 target nucleic acids are NOT DETECTED. The SARS-CoV-2 RNA is generally detectable in upper respiratoy specimens during the acute phase of infection. The lowest concentration of SARS-CoV-2 viral copies this assay can detect is 131 copies/mL. A negative result does not preclude SARS-Cov-2 infection and should not be used as the sole basis for treatment or other patient management decisions. A negative result may occur with  improper specimen collection/handling, submission of specimen other than nasopharyngeal swab, presence of viral mutation(s) within the areas targeted by this assay, and inadequate number of viral copies (<131 copies/mL). A negative result must be combined with clinical observations, patient history, and epidemiological information. The expected result is Negative. Fact Sheet for Patients:  PinkCheek.be Fact Sheet for Healthcare Providers:  GravelBags.it This test is not yet ap proved or cleared by the [place] FDA and  has been authorized for detection and/or diagnosis of SARS-CoV-2 by FDA under an Emergency Use Authorization (EUA). This EUA will remain  in effect (meaning this test can be used) for the duration of the COVID-19 declaration under Section 564(b)(1) of the Act, 21 U.S.C. section 360bbb-3(b)(1), unless the authorization is terminated or revoked sooner.    Influenza A by PCR NEGATIVE NEGATIVE   Influenza B by PCR NEGATIVE NEGATIVE    Comment: (NOTE) The Xpert Xpress SARS-CoV-2/FLU/RSV assay is intended as an aid in  the diagnosis of influenza from Nasopharyngeal swab specimens and  should not be used as a sole basis for treatment. Nasal washings and  aspirates are unacceptable for Xpert Xpress SARS-CoV-2/FLU/RSV  testing. Fact Sheet for  Patients: PinkCheek.be Fact Sheet for Healthcare Providers: GravelBags.it This test is not yet approved or cleared by the [place] FDA and  has been authorized for detection and/or diagnosis of SARS-CoV-2 by  FDA under an Emergency Use Authorization (EUA). This EUA will remain  in effect (meaning this test can be used) for the duration of the  Covid-19 declaration under Section 564(b)(1) of the Act, 21  U.S.C. section 360bbb-3(b)(1), unless the authorization is  terminated or revoked. Performed at [facility], [place] [address] [place], [place] [address]   Beta-hydroxybutyric acid     Status: Abnormal   Collection Time: 10/30/19  9:44 PM  Result Value Ref Range   Beta-Hydroxybutyric Acid 2.58 (H) 0.05 - 0.27 mmol/L    Comment: Performed at [facility], [place] [address], [place], [place] [address]  Blood gas, venous     Status: Abnormal   Collection Time: 10/30/19  9:44 PM  Result Value Ref Range   pH, Ven 7.349 7.250 - 7.430   pCO2, Ven 35.1 (L) 44.0 - 60.0 mmHg   pO2, Ven 57.1 (H) 32.0 - 45.0 mmHg   Bicarbonate 18.8 (L) 20.0 - 28.0 mmol/L   Acid-base deficit 5.6 (H) 0.0 - 2.0 mmol/L   O2 Saturation 86.9 %   Patient temperature 98.6     Comment: Performed at [facility], [place] [address], [place], [place] [address]  Hemoglobin A1c     Status: Abnormal   Collection Time: 10/30/19  9:44 PM  Result Value Ref Range   Hgb A1c MFr Bld 8.5 (  H) 4.8 - 5.6 %    Comment: (NOTE) Pre diabetes:          5.7%-6.4% Diabetes:              >6.4% Glycemic control for   <7.0% adults with diabetes    Mean Plasma Glucose 197.25 mg/dL    Comment: Performed at [facility], [place] [address] [place], [place] [address]  POC CBG, ED     Status: Abnormal   Collection Time: 10/30/19 11:46 PM  Result Value Ref Range   Glucose-Capillary 412 (H) 70 - 99 mg/dL  CBG monitoring, ED      Status: Abnormal   Collection Time: 10/31/19  1:26 AM  Result Value Ref Range   Glucose-Capillary 370 (H) 70 - 99 mg/dL  Glucose, capillary     Status: Abnormal   Collection Time: 10/31/19  4:17 AM  Result Value Ref Range   Glucose-Capillary 321 (H) 70 - 99 mg/dL  Urinalysis, Routine w reflex microscopic     Status: Abnormal   Collection Time: 10/31/19  4:53 AM  Result Value Ref Range   Color, Urine AMBER (A) YELLOW    Comment: BIOCHEMICALS MAY BE AFFECTED BY COLOR   APPearance CLOUDY (A) CLEAR   Specific Gravity, Urine 1.027 1.005 - 1.030   pH 5.0 5.0 - 8.0   Glucose, UA 50 (A) NEGATIVE mg/dL   Hgb urine dipstick NEGATIVE NEGATIVE   Bilirubin Urine NEGATIVE NEGATIVE   Ketones, ur NEGATIVE NEGATIVE mg/dL   Protein, ur 30 (A) NEGATIVE mg/dL   Nitrite NEGATIVE NEGATIVE   Leukocytes,Ua TRACE (A) NEGATIVE   RBC / HPF 6-10 0 - 5 RBC/hpf   WBC, UA 6-10 0 - 5 WBC/hpf   Bacteria, UA RARE (A) NONE SEEN   Squamous Epithelial / LPF 0-5 0 - 5   Budding Yeast PRESENT     Comment: Performed at [facility], [place] [address], [place], [place] [address]  Lactic acid, plasma     Status: None   Collection Time: 10/31/19  5:58 AM  Result Value Ref Range   Lactic Acid, Venous 1.4 0.5 - 1.9 mmol/L    Comment: Performed at [facility], [place] [address], [place], [place] [address]  Basic metabolic panel     Status: Abnormal   Collection Time: 10/31/19  5:58 AM  Result Value Ref Range   Sodium 133 (L) 135 - 145 mmol/L   Potassium 4.7 3.5 - 5.1 mmol/L   Chloride 101 98 - 111 mmol/L   CO2 22 22 - 32 mmol/L   Glucose, Bld 307 (H) 70 - 99 mg/dL   BUN 36 (H) 6 - 20 mg/dL   Creatinine, Ser 2.21 (H) 0.44 - 1.00 mg/dL   Calcium 8.3 (L) 8.9 - 10.3 mg/dL   GFR calc non Af Amer 25 (L) >60 mL/min   GFR calc Af Amer 29 (L) >60 mL/min   Anion gap 10 5 - 15    Comment: Performed at [facility], [place] [address] [place], [place] [address]  CBC      Status: Abnormal   Collection Time: 10/31/19  5:58 AM  Result Value Ref Range   WBC 18.0 (H) 4.0 - 10.5 K/uL   RBC 3.72 (L) 3.87 - 5.11 MIL/uL   Hemoglobin 9.8 (L) 12.0 - 15.0 g/dL   HCT 32.4 (L) 36.0 - 46.0 %   MCV 87.1 80.0 - 100.0 fL   MCH 26.3 26.0 - 34.0  pg   MCHC 30.2 30.0 - 36.0 g/dL   RDW 14.6 11.5 - 15.5 %   Platelets 356 150 - 400 K/uL   nRBC 0.1 0.0 - 0.2 %    Comment: Performed at [facility], [place] [address], [place], [place] [address]  Osmolality     Status: None   Collection Time: 10/31/19  5:58 AM  Result Value Ref Range   Osmolality 293 275 - 295 mOsm/kg    Comment: Performed at [facility], [place] [address] [place], [place] [address]  Glucose, capillary     Status: Abnormal   Collection Time: 10/31/19  7:52 AM  Result Value Ref Range   Glucose-Capillary 248 (H) 70 - 99 mg/dL    CT Abdomen Pelvis Wo Contrast  Result Date: 10/30/2019 CLINICAL DATA:  Right lower quadrant abdominal pain. EXAM: CT ABDOMEN AND PELVIS WITHOUT CONTRAST TECHNIQUE: Multidetector CT imaging of the abdomen and pelvis was performed following the standard protocol without IV contrast. COMPARISON:  March 21, 2017. FINDINGS: Lower chest: There are few small stable bibasilar pulmonary nodules.The heart size is normal. Hepatobiliary: There is decreased hepatic attenuation suggestive of hepatic steatosis. Status post cholecystectomy.There is no biliary ductal dilation. Pancreas: Normal contours without ductal dilatation. No peripancreatic fluid collection. Spleen: No splenic laceration or hematoma. Adrenals/Urinary Tract: --Adrenal glands: No adrenal hemorrhage. --Right kidney/ureter: No hydronephrosis or perinephric hematoma. --Left kidney/ureter: No hydronephrosis or perinephric hematoma. --Urinary bladder: Unremarkable. Stomach/Bowel: --Stomach/Duodenum: No hiatal hernia or other gastric abnormality. Normal duodenal course and caliber. --Small bowel: No dilatation or inflammation.  --Colon: Findings are concerning for perforated sigmoid colitis or diverticulitis. There is wall thickening of the sigmoid colon with a large adjacent abscess superiorly measuring approximately 12 x 8 by 9.5 cm. This collection appears to extend inferiorly and cross the patient's midline to an additional collection measuring approximately 5.4 x 3.4 cm. The patient's left ovary is not reliably identified and may abut these collections. --Appendix: Normal. Vascular/Lymphatic: Atherosclerotic calcification is present within the non-aneurysmal abdominal aorta, without hemodynamically significant stenosis. --there are mildly enlarged retroperitoneal lymph nodes, presumably reactive. --there are mildly enlarged mesenteric lymph nodes, presumably reactive. --there are mildly enlarged pelvic lymph nodes, presumably reactive. Reproductive: Unremarkable Other: No ascites or free air. There is a small volume of free fluid in the patient's pelvis. Musculoskeletal. No acute displaced fractures. IMPRESSION: 1. Findings are most consistent with perforated sigmoid diverticulitis/colitis. There is a large 12 cm abscess as detailed above in addition to other complex fluid collections extending into the patient's pelvis. 2. Normal appendix in the right lower quadrant. 3. Status post cholecystectomy. Aortic Atherosclerosis (ICD10-I70.0). Electronically Signed   By: Constance Holster M.D.   On: 10/30/2019 20:07    Review of Systems  Constitutional: Positive for appetite change and diaphoresis.  HENT: Negative.   Eyes: Negative.   Respiratory: Positive for shortness of breath.   Cardiovascular: Negative.   Gastrointestinal: Positive for abdominal pain and diarrhea.  Endocrine: Negative.   Genitourinary: Negative.   Musculoskeletal: Negative.   Neurological: Negative.   Hematological: Negative.   Psychiatric/Behavioral: Negative.    Blood pressure (!) 100/58, pulse 92, temperature 98.7 F (37.1 C), temperature source  Oral, resp. rate 19, height 5\' 8"  (1.727 m), weight (!) 175.8 kg, SpO2 100 %. Physical Exam  Vitals reviewed. Constitutional: She is oriented to person, place, and time. No distress.  Morbidly obese, in bed with tobaggan on, cold washcloth on forehead  HENT:  Head: Normocephalic and atraumatic.  Right Ear: External ear normal.  Left Ear: External ear normal.  Eyes: Pupils are equal, round, and reactive to light. Conjunctivae are normal. No scleral icterus.  Neck: No tracheal deviation present. No thyromegaly present.  Cardiovascular: Normal rate, regular rhythm and normal heart sounds.  No murmur heard. Respiratory: Effort normal and breath sounds normal. No respiratory distress. She has no wheezes.  GI: Bowel sounds are normal. She exhibits no mass. There is abdominal tenderness (diffusely). There is guarding (voluntary). There is no rebound.  Musculoskeletal:        General: Edema (marked bilateral LE edema) present.     Cervical back: Normal range of motion and neck supple.  Neurological: She is alert and oriented to person, place, and time.  Skin: Skin is warm and dry. She is not diaphoretic.  Psychiatric: She has a normal mood and affect. Her behavior is normal.   Assessment/Plan: Acute diverticulitis with large abscess  NPO, may have ice chips  IV Zosyn  Hold Eliquis in anticipation of drainage procedure by IR  Will consult IR for possible percutaneous drainage procedure today or tomorrow  Cdiff work up for diarrhea Diabetes, type II  Slowly improving overnight HTN Morbid obesity, immobility Hx of pulmonary embolism  Heparin per medical service  Hold Eliquis Asthma  HHN prn  I will contact IR this morning and ask them to review her studies from last night.  Hopefully good candidate for percutaneous drainage procedure, and operative intervention can be avoided.  Extremely high risk for any surgical intervention at this time.  Will follow with you.  Armandina Gemma,  MD [facility] Office: [place] 10/31/2019, 9:01 AM

## 2019-10-31 NOTE — Consult Note (Signed)
Chief Complaint: Patient was seen in consultation today for CT guided drainage of abdomino-pelvic fluid collections Chief Complaint  Patient presents with  . Abdominal Pain    Referring Physician(s): Clent Demark  Supervising Physician: Markus Daft  Patient Status: [facility] - In-pt  History of Present Illness: Kristen [name] is a 54 y.o. female with past medical history of anxiety, asthma, back pain, depression, diabetes, prior PE/DVT, hypertension, obesity, PCOS, sleep apnea, prior stroke, and prior cholecystectomy who was recently admitted to [name] with persistent abdominal pain associated with nausea.  Subsequent imaging revealed findings most consistent with perforated sigmoid diverticulitis/colitis with large 12 cm abscess in addition to other complex fluid collections extending into the pelvis.  Currently afebrile, WBC 18, hemoglobin 9.8, plts 356k, creat 2.21, PT 17.3/INR 1.4.  She is COVID-19 negative and C. difficile negative.  Request now received from surgery for image guided drainage of the abdominal abscess(es).   Past Medical History:  Diagnosis Date  . Anxiety   . Asthma   . 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 Depression   . Diabetes mellitus   . History of pulmonary embolus (PE)   . Hypertension   . Migraines   . Near syncope   . Obesity   . PCOS (polycystic ovarian syndrome)   . Peripheral edema   . Sleep apnea    uses cpap set on 10  . Stroke [facility]) 12/2011   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     Past Surgical History:  Procedure Laterality Date  . ABDOMINAL SURGERY     hernia repair  . CHOLECYSTECTOMY  [id number]  . COLONOSCOPY WITH PROPOFOL N/A 07/31/2016   Procedure: COLONOSCOPY WITH PROPOFOL;  Surgeon: Arta Silence, MD;  Location: [facility];  Service: Endoscopy;  Laterality: N/A;  . FOOT SURGERY Right 06/2007  . KNEE ARTHROSCOPY  09/2008   left  . KNEE ARTHROSCOPY  april 2011   left  . NASAL SINUS SURGERY    . NASAL SINUS SURGERY   1990  . SHOULDER SURGERY  2004   left    Allergies: Cafergot, Glucophage [metformin hcl], and Canagliflozin  Medications: Prior to Admission medications   Medication Sig Start Date End Date Taking? Authorizing Provider  acetaminophen (TYLENOL) 500 MG tablet Take 1,000 mg by mouth every 6 (six) hours as needed for moderate pain.   Yes [provider]  albuterol (ACCUNEB) 1.25 MG/3ML nebulizer solution Inhale 1 ampule into the lungs 3 (three) times daily as needed for wheezing or shortness of breath.  12/12/10  Yes [provider]  albuterol (VENTOLIN HFA) 108 (90 Base) MCG/ACT inhaler Inhale 2 puffs into the lungs every 4 (four) hours as needed for wheezing or shortness of breath.  01/04/14  Yes [provider]  amoxicillin-clavulanate (AUGMENTIN) 875-125 MG tablet Take 1 tablet by mouth 2 (two) times daily. 10/25/19  Yes [provider]  apixaban (ELIQUIS) 5 MG TABS tablet Take two tabs twice a day for 7 days, then take one tab twice a day for a year. Patient taking differently: Take 2.5 mg by mouth 2 (two) times daily.  03/24/17  Yes Florencia Reasons, MD  aspirin EC 81 MG tablet Take 81 mg by mouth daily.   Yes [provider]  atorvastatin (LIPITOR) 40 MG tablet Take 40 mg by mouth daily.   Yes [provider]  buPROPion (WELLBUTRIN XL) 150 MG 24 hr tablet Take 150 mg by mouth every morning. 09/13/19  Yes [provider]  carisoprodol (SOMA) 350 MG tablet TAKE 1 TABLET (350 MG TOTAL) BY MOUTH 3 (THREE) TIMES DAILY. 09/15/19  Yes Meredith Staggers, MD  Cholecalciferol (VITAMIN D3) 2000 units capsule Take 2,000 Units by mouth daily.    Yes [provider]  Fluticasone-Salmeterol (ADVAIR) 500-50 MCG/DOSE AEPB Inhale 1 puff into the lungs every 12 (twelve) hours.   Yes [provider]  insulin aspart (NOVOLOG FLEXPEN) 100 UNIT/ML FlexPen Inject 15 Units into the skin 3 (three) times daily with meals. Patient taking differently:  Inject 44 Units into the skin 3 (three) times daily with meals.  03/24/17  Yes Florencia Reasons, MD  insulin glargine (LANTUS) 100 UNIT/ML injection Inject 80 Units into the skin at bedtime.    Yes [provider]  lisinopril (PRINIVIL,ZESTRIL) 20 MG tablet Take 1 tablet (20 mg total) by mouth daily. 03/24/17 10/30/19 Yes Florencia Reasons, MD  montelukast (SINGULAIR) 10 MG tablet Take 10 mg by mouth at bedtime.    Yes [provider]  oxybutynin (DITROPAN) 5 MG tablet Take 5 mg by mouth 2 (two) times daily.  03/13/16  Yes [provider]  Oxycodone HCl 10 MG TABS Take 1 tablet (10 mg total) by mouth every 8 (eight) hours as needed. Patient taking differently: Take 10 mg by mouth every 8 (eight) hours as needed (pain).  08/31/19  Yes Bayard Hugger, NP  promethazine (PHENERGAN) 25 MG tablet Take 25 mg by mouth every 6 (six) hours as needed for nausea or vomiting.    Yes [provider]  senna-docusate (SENOKOT-S) 8.6-50 MG tablet Take 1 tablet by mouth at bedtime. 03/24/17  Yes Florencia Reasons, MD  sertraline (ZOLOFT) 100 MG tablet Take 200 mg by mouth daily.    Yes [provider]  BD PEN NEEDLE NANO U/F 32G X 4 MM MISC 2 (two) times daily. as directed 03/13/16   [provider]  nystatin cream (MYCOSTATIN) Apply 1 application topically 2 (two) times daily. Patient not taking: Reported on 10/31/2019 03/11/18   Huel Cote, NP     Family History  Problem Relation Age of Onset  . Hypertension Maternal Grandmother   . Stroke Maternal Grandmother   . Diabetes Maternal Grandfather   . Cancer Mother        lung  . Cancer Father   . Diabetes Sister   . Breast cancer Cousin   . Heart attack Neg Hx     Social History   Socioeconomic History  . Marital status: Single    Spouse name: Not on file  . Number of children: Not on file  . Years of education: Not on file  . Highest education level: Not on file  Occupational History  . Not on file  Tobacco Use  . Smoking  status: Never Smoker  . Smokeless tobacco: Never Used  Substance and Sexual Activity  . Alcohol use: No    Alcohol/week: 0.0 standard drinks  . Drug use: No  . Sexual activity: Not Currently    Comment: 1st intercourse 80 yo-1 partner  Other Topics Concern  . Not on file  Social History Narrative  . Not on file   Social Determinants of Health   Financial Resource Strain:   . Difficulty of Paying Living Expenses: Not on file  Food Insecurity:   . Worried About Charity fundraiser in the Last Year: Not on file  . Ran Out of Food in the Last Year: Not on file  Transportation  Needs:   . Lack of Transportation (Medical): Not on file  . Lack of Transportation (Non-Medical): Not on file  Physical Activity:   . Days of Exercise per Week: Not on file  . Minutes of Exercise per Session: Not on file  Stress:   . Feeling of Stress : Not on file  Social Connections:   . Frequency of Communication with Friends and Family: Not on file  . Frequency of Social Gatherings with Friends and Family: Not on file  . Attends Religious Services: Not on file  . Active Member of Clubs or Organizations: Not on file  . Attends Archivist Meetings: Not on file  . Marital Status: Not on file      Review of Systems see above, currently denies fever, headache, chest pain, worsening dyspnea/cough, vomiting or abnormal bleeding  Vital Signs: BP (!) 100/58 (BP Location: Right Arm)   Pulse 92   Temp 98.7 F (37.1 C) (Oral)   Resp 19   Ht 5\' 8"  (1.727 m)   Wt (!) 387 lb 9.1 oz (175.8 kg)   SpO2 100%   BMI 58.93 kg/m   Physical Exam awake, alert.  Chest clear to auscultation bilaterally.  Heart with regular rate and rhythm.  Abdomen obese, positive bowel sounds, mod diffuse tenderness throughout abdomen; lower extremity edema noted  Imaging: CT Abdomen Pelvis Wo Contrast  Result Date: 10/30/2019 CLINICAL DATA:  Right lower quadrant abdominal pain. EXAM: CT ABDOMEN AND PELVIS WITHOUT  CONTRAST TECHNIQUE: Multidetector CT imaging of the abdomen and pelvis was performed following the standard protocol without IV contrast. COMPARISON:  March 21, 2017. FINDINGS: Lower chest: There are few small stable bibasilar pulmonary nodules.The heart size is normal. Hepatobiliary: There is decreased hepatic attenuation suggestive of hepatic steatosis. Status post cholecystectomy.There is no biliary ductal dilation. Pancreas: Normal contours without ductal dilatation. No peripancreatic fluid collection. Spleen: No splenic laceration or hematoma. Adrenals/Urinary Tract: --Adrenal glands: No adrenal hemorrhage. --Right kidney/ureter: No hydronephrosis or perinephric hematoma. --Left kidney/ureter: No hydronephrosis or perinephric hematoma. --Urinary bladder: Unremarkable. Stomach/Bowel: --Stomach/Duodenum: No hiatal hernia or other gastric abnormality. Normal duodenal course and caliber. --Small bowel: No dilatation or inflammation. --Colon: Findings are concerning for perforated sigmoid colitis or diverticulitis. There is wall thickening of the sigmoid colon with a large adjacent abscess superiorly measuring approximately 12 x 8 by 9.5 cm. This collection appears to extend inferiorly and cross the patient's midline to an additional collection measuring approximately 5.4 x 3.4 cm. The patient's left ovary is not reliably identified and may abut these collections. --Appendix: Normal. Vascular/Lymphatic: Atherosclerotic calcification is present within the non-aneurysmal abdominal aorta, without hemodynamically significant stenosis. --there are mildly enlarged retroperitoneal lymph nodes, presumably reactive. --there are mildly enlarged mesenteric lymph nodes, presumably reactive. --there are mildly enlarged pelvic lymph nodes, presumably reactive. Reproductive: Unremarkable Other: No ascites or free air. There is a small volume of free fluid in the patient's pelvis. Musculoskeletal. No acute displaced fractures.  IMPRESSION: 1. Findings are most consistent with perforated sigmoid diverticulitis/colitis. There is a large 12 cm abscess as detailed above in addition to other complex fluid collections extending into the patient's pelvis. 2. Normal appendix in the right lower quadrant. 3. Status post cholecystectomy. Aortic Atherosclerosis (ICD10-I70.0). Electronically Signed   By: Constance Holster M.D.   On: 10/30/2019 20:07    Labs:  CBC: Recent Labs    04/26/19 1205 10/30/19 1748 10/31/19 0558  WBC 8.9 26.6* 18.0*  HGB 13.1 12.1 9.8*  HCT 41.3 41.1 32.4*  PLT 240 496* 356    COAGS: Recent Labs    10/31/19 1056  INR 1.4*    BMP: Recent Labs    04/26/19 1205 10/30/19 1748 10/31/19 0558  NA 141 129* 133*  K 5.6* 5.1 4.7  CL 104 98 101  CO2 29 18* 22  GLUCOSE 161* 398* 307*  BUN 17 26* 36*  CALCIUM 9.1 9.1 8.3*  CREATININE 1.14* 1.86* 2.21*  GFRNONAA 55* 30* 25*  GFRAA >60 35* 29*    LIVER FUNCTION TESTS: Recent Labs    10/30/19 1748  BILITOT 1.2  AST 20  ALT 39  ALKPHOS 97  PROT 7.5  ALBUMIN 3.1*    TUMOR MARKERS: No results for input(s): AFPTM, CEA, CA199, CHROMGRNA in the last 8760 hours.  Assessment and Plan: 54 y.o. female with past medical history of anxiety, asthma, back pain, depression, diabetes, prior PE/DVT, hypertension, obesity, PCOS, sleep apnea, prior stroke, and prior cholecystectomy who was recently admitted to [facility] with persistent abdominal pain associated with nausea.  Subsequent imaging revealed findings most consistent with perforated sigmoid diverticulitis/colitis with large 12 cm abscess in addition to other complex fluid collections extending into the pelvis.  Currently afebrile, WBC 18, hemoglobin 9.8, plts 356k, creat 2.21, PT 17.3/INR 1.4.  She is COVID-19 negative and C. difficile negative.  Request now received from surgery for image guided drainage of the abdominal abscess(es).  Imaging studies have been reviewed by Dr.Henn.  Risks and benefits discussed with the patient including bleeding, infection, damage to adjacent structures, bowel perforation/fistula connection, and sepsis.  All of the patient's questions were answered, patient is agreeable to proceed. Consent signed and in chart.   Procedure scheduled for today   Thank you for this interesting consult.  I greatly enjoyed meeting Kristen [name] and look forward to participating in their care.  A copy of this report was sent to the requesting provider on this date.  Electronically Signed: D. Rowe Robert, PA-C 10/31/2019, 12:47 PM   I spent a total of  25 minutes in face to face in clinical consultation, greater than 50% of which was counseling/coordinating care for CT-guided drainage of abdominal/pelvic fluid collections/abscesses

## 2019-11-01 ENCOUNTER — Other Ambulatory Visit: Payer: Self-pay

## 2019-11-01 ENCOUNTER — Encounter: Payer: PPO | Admitting: Registered Nurse

## 2019-11-01 DIAGNOSIS — I10 Essential (primary) hypertension: Secondary | ICD-10-CM

## 2019-11-01 DIAGNOSIS — Z86711 Personal history of pulmonary embolism: Secondary | ICD-10-CM

## 2019-11-01 DIAGNOSIS — K5720 Diverticulitis of large intestine with perforation and abscess without bleeding: Principal | ICD-10-CM

## 2019-11-01 DIAGNOSIS — E871 Hypo-osmolality and hyponatremia: Secondary | ICD-10-CM

## 2019-11-01 DIAGNOSIS — N1831 Chronic kidney disease, stage 3a: Secondary | ICD-10-CM

## 2019-11-01 DIAGNOSIS — E1121 Type 2 diabetes mellitus with diabetic nephropathy: Secondary | ICD-10-CM

## 2019-11-01 LAB — GLUCOSE, CAPILLARY
Glucose-Capillary: 136 mg/dL — ABNORMAL HIGH (ref 70–99)
Glucose-Capillary: 138 mg/dL — ABNORMAL HIGH (ref 70–99)
Glucose-Capillary: 151 mg/dL — ABNORMAL HIGH (ref 70–99)
Glucose-Capillary: 156 mg/dL — ABNORMAL HIGH (ref 70–99)

## 2019-11-01 LAB — COMPREHENSIVE METABOLIC PANEL
ALT: 25 U/L (ref 0–44)
AST: 23 U/L (ref 15–41)
Albumin: 2.2 g/dL — ABNORMAL LOW (ref 3.5–5.0)
Alkaline Phosphatase: 83 U/L (ref 38–126)
Anion gap: 13 (ref 5–15)
BUN: 44 mg/dL — ABNORMAL HIGH (ref 6–20)
CO2: 22 mmol/L (ref 22–32)
Calcium: 8.1 mg/dL — ABNORMAL LOW (ref 8.9–10.3)
Chloride: 103 mmol/L (ref 98–111)
Creatinine, Ser: 1.98 mg/dL — ABNORMAL HIGH (ref 0.44–1.00)
GFR calc Af Amer: 33 mL/min — ABNORMAL LOW (ref >60)
GFR calc non Af Amer: 28 mL/min — ABNORMAL LOW (ref >60)
Glucose, Bld: 162 mg/dL — ABNORMAL HIGH (ref 70–99)
Potassium: 4.1 mmol/L (ref 3.5–5.1)
Sodium: 138 mmol/L (ref 135–145)
Total Bilirubin: 0.6 mg/dL (ref 0.3–1.2)
Total Protein: 6.1 g/dL — ABNORMAL LOW (ref 6.5–8.1)

## 2019-11-01 LAB — CBC
HCT: 32 % — ABNORMAL LOW (ref 36.0–46.0)
Hemoglobin: 9.6 g/dL — ABNORMAL LOW (ref 12.0–15.0)
MCH: 26.4 pg (ref 26.0–34.0)
MCHC: 30 g/dL (ref 30.0–36.0)
MCV: 88.2 fL (ref 80.0–100.0)
Platelets: 317 10*3/uL (ref 150–400)
RBC: 3.63 MIL/uL — ABNORMAL LOW (ref 3.87–5.11)
RDW: 15 % (ref 11.5–15.5)
WBC: 21.5 10*3/uL — ABNORMAL HIGH (ref 4.0–10.5)
nRBC: 0 % (ref 0.0–0.2)

## 2019-11-01 LAB — PROTIME-INR
INR: 1.4 — ABNORMAL HIGH (ref 0.8–1.2)
Prothrombin Time: 17.2 seconds — ABNORMAL HIGH (ref 11.4–15.2)

## 2019-11-01 LAB — PHOSPHORUS: Phosphorus: 4.2 mg/dL (ref 2.5–4.6)

## 2019-11-01 LAB — MAGNESIUM: Magnesium: 1.7 mg/dL (ref 1.7–2.4)

## 2019-11-01 NOTE — Progress Notes (Signed)
Pt. declined CPAP set up/use, made aware to notify if needed.

## 2019-11-01 NOTE — Progress Notes (Signed)
PROGRESS NOTE    Kristen [name]  OEU:[id number]  DOB: 07-Mar-1966  PCP: Lawerance Cruel, MD Admit date:10/30/2019  54 year old Caucasian female with PMH of morbid obesity, type 2 diabetes mellitus, chronic pain on opioids, hypertension, depression, anxiety, asthma, s/p cholecystectomy, CVA with residual vision loss, PCOS, sleep apnea who was brought to the ED by EMS from home for worsening abdominal pain associated with diarrhea. Recently treated for UTI with no improvement in abd pain. Lives at home and largely immobile. Reported chronic depression and sometimes has suicidal ideations but none now. ED Course: Afebrile,noted to be hypertensive, tachycardic.Found to have leukocytosis of 26K and blood sugar elevated in the 300s. CT abdomen pelvis without contrast (no contrast due to renal dysfunction)showed perforated sigmoid diverticulitis with a large 12 cm abscess in addition to complex fluid collections extending into the patient's pelvis. ED provider spoke to surgeon on call Dr. Gala Lewandowsky who indicated no surgical need at this time and recommended IR consult for drainage of the abscess. Hospital course: Patient admitted to [facility] for IV abx and IR consulted. Patient underwent CT guided drain x3  placement on 1/10.  Subjective:  Patient resting comfortably--states pain controlled with prn meds and much better than when she came in. Right sided drain with turbid fluid and ?sediment with fluffy/cotton wool appearance  Objective: Vitals:   10/31/19 2002 10/31/19 2204 11/01/19 0417 11/01/19 1320  BP: (!) 87/47 (!) 118/57 (!) 122/53 117/66  Pulse: 97 97 86 88  Resp: 20  16 19   Temp: 99.9 F (37.7 C)  99.1 F (37.3 C) 98.4 F (36.9 C)  TempSrc: Oral  Oral Oral  SpO2: 93%  94% 94%  Weight:      Height:        Intake/Output Summary (Last 24 hours) at 11/01/2019 1346 Last data filed at 11/01/2019 0900 Gross per 24 hour  Intake 838.02 ml  Output 850 ml  Net -11.98 ml   Filed Weights   10/31/19 0200  Weight: (!) 175.8 kg    Physical Examination:  General exam: Appears in mild discomfort due to abdominal drains in place Respiratory system: Clear to auscultation. Respiratory effort normal. Cardiovascular system: S1 & S2 heard, RRR. No JVD, murmurs, rubs, gallops or clicks. No pedal edema. Gastrointestinal system: Abdomen is obese, nondistended, soft. Mild tenderness in lower abdominal area. Has 2 left sided and 1 right sided drain in place. Normal bowel sounds heard. Central nervous system: Alert and oriented. No new focal neurological deficits. Extremities: No contractures, edema or joint deformities.  Skin: No rashes, lesions or ulcers Psychiatry: Judgement and insight appear normal. Mood & affect appropriate.   Data Reviewed: I have personally reviewed following labs and imaging studies  CBC: Recent Labs  Lab 10/30/19 1748 10/31/19 0558 11/01/19 0526  WBC 26.6* 18.0* 21.5*  HGB 12.1 9.8* 9.6*  HCT 41.1 32.4* 32.0*  MCV 87.6 87.1 88.2  PLT 496* 356 154   Basic Metabolic Panel: Recent Labs  Lab 10/30/19 1748 10/31/19 0558 11/01/19 0526  NA 129* 133* 138  K 5.1 4.7 4.1  CL 98 101 103  CO2 18* 22 22  GLUCOSE 398* 307* 162*  BUN 26* 36* 44*  CREATININE 1.86* 2.21* 1.98*  CALCIUM 9.1 8.3* 8.1*  MG  --   --  1.7  PHOS  --   --  4.2   GFR: Estimated Creatinine Clearance: 56.4 mL/min (A) (by C-G formula based on SCr of 1.98 mg/dL (H)). Liver Function Tests: Recent Labs  Lab 10/30/19  1748 11/01/19 0526  AST 20 23  ALT 39 25  ALKPHOS 97 83  BILITOT 1.2 0.6  PROT 7.5 6.1*  ALBUMIN 3.1* 2.2*   Recent Labs  Lab 10/30/19 1748  LIPASE 23   No results for input(s): AMMONIA in the last 168 hours. Coagulation Profile: Recent Labs  Lab 10/31/19 1056 11/01/19 0526  INR 1.4* 1.4*   Cardiac Enzymes: No results for input(s): CKTOTAL, CKMB, CKMBINDEX, TROPONINI in the last 168 hours. BNP (last 3 results) No results for input(s): PROBNP in the  last 8760 hours. HbA1C: Recent Labs    10/30/19 2144  HGBA1C 8.5*   CBG: Recent Labs  Lab 10/31/19 1608 10/31/19 1958 11/01/19 0003 11/01/19 0415 11/01/19 0738  GLUCAP 118* 142* 151* 138* 156*   Lipid Profile: No results for input(s): CHOL, HDL, LDLCALC, TRIG, CHOLHDL, LDLDIRECT in the last 72 hours. Thyroid Function Tests: No results for input(s): TSH, T4TOTAL, FREET4, T3FREE, THYROIDAB in the last 72 hours. Anemia Panel: No results for input(s): VITAMINB12, FOLATE, FERRITIN, TIBC, IRON, RETICCTPCT in the last 72 hours. Sepsis Labs: Recent Labs  Lab 10/30/19 1916 10/31/19 0558  LATICACIDVEN 1.3 1.4    Recent Results (from the past 240 hour(s))  Respiratory Panel by RT PCR (Flu A&B, Covid) - Nasopharyngeal Swab     Status: None   Collection Time: 10/30/19  7:25 PM   Specimen: Nasopharyngeal Swab  Result Value Ref Range Status   SARS Coronavirus 2 by RT PCR NEGATIVE NEGATIVE Final    Comment: (NOTE) SARS-CoV-2 target nucleic acids are NOT DETECTED. The SARS-CoV-2 RNA is generally detectable in upper respiratoy specimens during the acute phase of infection. The lowest concentration of SARS-CoV-2 viral copies this assay can detect is 131 copies/mL. A negative result does not preclude SARS-Cov-2 infection and should not be used as the sole basis for treatment or other patient management decisions. A negative result may occur with  improper specimen collection/handling, submission of specimen other than nasopharyngeal swab, presence of viral mutation(s) within the areas targeted by this assay, and inadequate number of viral copies (<131 copies/mL). A negative result must be combined with clinical observations, patient history, and epidemiological information. The expected result is Negative. Fact Sheet for Patients:  PinkCheek.be Fact Sheet for Healthcare Providers:  GravelBags.it This test is not yet ap proved  or cleared by the [place] FDA and  has been authorized for detection and/or diagnosis of SARS-CoV-2 by FDA under an Emergency Use Authorization (EUA). This EUA will remain  in effect (meaning this test can be used) for the duration of the COVID-19 declaration under Section 564(b)(1) of the Act, 21 U.S.C. section 360bbb-3(b)(1), unless the authorization is terminated or revoked sooner.    Influenza A by PCR NEGATIVE NEGATIVE Final   Influenza B by PCR NEGATIVE NEGATIVE Final    Comment: (NOTE) The Xpert Xpress SARS-CoV-2/FLU/RSV assay is intended as an aid in  the diagnosis of influenza from Nasopharyngeal swab specimens and  should not be used as a sole basis for treatment. Nasal washings and  aspirates are unacceptable for Xpert Xpress SARS-CoV-2/FLU/RSV  testing. Fact Sheet for Patients: PinkCheek.be Fact Sheet for Healthcare Providers: GravelBags.it This test is not yet approved or cleared by the [place] FDA and  has been authorized for detection and/or diagnosis of SARS-CoV-2 by  FDA under an Emergency Use Authorization (EUA). This EUA will remain  in effect (meaning this test can be used) for the duration of the  Covid-19 declaration under Section 564(b)(1) of  the Act, 21  U.S.C. section 360bbb-3(b)(1), unless the authorization is  terminated or revoked. Performed at [facility], [place] [address] [place], [place] [address]   Culture, blood (routine x 2)     Status: None (Preliminary result)   Collection Time: 10/30/19  8:13 PM   Specimen: BLOOD  Result Value Ref Range Status   Specimen Description   Final    BLOOD LEFT HAND Performed at [facility] [address] [place], [place] [address]    Special Requests   Final    BOTTLES DRAWN AEROBIC AND ANAEROBIC Blood Culture adequate volume Performed at [facility] [address] [place],  [place] [address]    Culture   Final    NO GROWTH 1 DAY Performed at [facility], [place] [address], [place], [place] [address]    Report Status PENDING  Incomplete  Culture, blood (routine x 2)     Status: None (Preliminary result)   Collection Time: 10/30/19 11:00 PM   Specimen: BLOOD RIGHT HAND  Result Value Ref Range Status   Specimen Description   Final    BLOOD RIGHT HAND Performed at [facility] [address], [place], [place] [address]    Special Requests   Final    BOTTLES DRAWN AEROBIC AND ANAEROBIC Blood Culture adequate volume Performed at [facility] [address] [place], [place] [address]    Culture   Final    NO GROWTH 1 DAY Performed at [facility], [place] [address], [place], [place] [address]    Report Status PENDING  Incomplete  C difficile quick scan w PCR reflex     Status: None   Collection Time: 10/31/19  8:16 AM   Specimen: STOOL  Result Value Ref Range Status   C Diff antigen NEGATIVE NEGATIVE Final   C Diff toxin NEGATIVE NEGATIVE Final   C Diff interpretation No C. difficile detected.  Final    Comment: Performed at [facility], [place] [address], [place], [place] [address]  Aerobic/Anaerobic Culture (surgical/deep wound)     Status: None (Preliminary result)   Collection Time: 10/31/19  4:11 PM   Specimen: Abscess  Result Value Ref Range Status   Specimen Description   Final    ABSCESS Performed at [facility] [address] [place], [place] [address]    Special Requests   Final    1 LEFT ABDOMINAL LATERAL Performed at [facility], [place] [address] [place], [place] [address]    Gram Stain   Final    ABUNDANT WBC PRESENT, PREDOMINANTLY PMN ABUNDANT GRAM POSITIVE COCCI IN PAIRS IN CLUSTERS IN CHAINS FEW GRAM NEGATIVE COCCOBACILLI RARE GRAM POSITIVE RODS    Culture   Final    FEW GRAM NEGATIVE RODS CULTURE REINCUBATED FOR BETTER  GROWTH Performed at [facility], [place] [address], [place], [place] [address]    Report Status PENDING  Incomplete  Aerobic/Anaerobic Culture (surgical/deep wound)     Status: None (Preliminary result)   Collection Time: 10/31/19  4:19 PM   Specimen: Abscess  Result Value Ref Range Status   Specimen Description   Final    ABSCESS Performed at [facility] [address] [place], [place] [address]    Special Requests   Final    2 LEFT ABDOMEN MEDIAL Performed at [facility], [place] [address], [place], [place] [address]  Gram Stain   Final    FEW WBC PRESENT,BOTH PMN AND MONONUCLEAR ABUNDANT GRAM POSITIVE COCCI IN PAIRS IN CHAINS RARE GRAM POSITIVE RODS RARE GRAM NEGATIVE RODS    Culture   Final    NO GROWTH < 12 HOURS Performed at [facility], [place] [address], [place], [place] [address]    Report Status PENDING  Incomplete      Radiology Studies: CT Abdomen Pelvis Wo Contrast  Result Date: 10/30/2019 CLINICAL DATA:  Right lower quadrant abdominal pain. EXAM: CT ABDOMEN AND PELVIS WITHOUT CONTRAST TECHNIQUE: Multidetector CT imaging of the abdomen and pelvis was performed following the standard protocol without IV contrast. COMPARISON:  March 21, 2017. FINDINGS: Lower chest: There are few small stable bibasilar pulmonary nodules.The heart size is normal. Hepatobiliary: There is decreased hepatic attenuation suggestive of hepatic steatosis. Status post cholecystectomy.There is no biliary ductal dilation. Pancreas: Normal contours without ductal dilatation. No peripancreatic fluid collection. Spleen: No splenic laceration or hematoma. Adrenals/Urinary Tract: --Adrenal glands: No adrenal hemorrhage. --Right kidney/ureter: No hydronephrosis or perinephric hematoma. --Left kidney/ureter: No hydronephrosis or perinephric hematoma. --Urinary bladder: Unremarkable. Stomach/Bowel: --Stomach/Duodenum: No hiatal hernia or other gastric abnormality.  Normal duodenal course and caliber. --Small bowel: No dilatation or inflammation. --Colon: Findings are concerning for perforated sigmoid colitis or diverticulitis. There is wall thickening of the sigmoid colon with a large adjacent abscess superiorly measuring approximately 12 x 8 by 9.5 cm. This collection appears to extend inferiorly and cross the patient's midline to an additional collection measuring approximately 5.4 x 3.4 cm. The patient's left ovary is not reliably identified and may abut these collections. --Appendix: Normal. Vascular/Lymphatic: Atherosclerotic calcification is present within the non-aneurysmal abdominal aorta, without hemodynamically significant stenosis. --there are mildly enlarged retroperitoneal lymph nodes, presumably reactive. --there are mildly enlarged mesenteric lymph nodes, presumably reactive. --there are mildly enlarged pelvic lymph nodes, presumably reactive. Reproductive: Unremarkable Other: No ascites or free air. There is a small volume of free fluid in the patient's pelvis. Musculoskeletal. No acute displaced fractures. IMPRESSION: 1. Findings are most consistent with perforated sigmoid diverticulitis/colitis. There is a large 12 cm abscess as detailed above in addition to other complex fluid collections extending into the patient's pelvis. 2. Normal appendix in the right lower quadrant. 3. Status post cholecystectomy. Aortic Atherosclerosis (ICD10-I70.0). Electronically Signed   By: Constance Holster M.D.   On: 10/30/2019 20:07   CT IMAGE GUIDED DRAINAGE BY PERCUTANEOUS CATHETER  Result Date: 10/31/2019 INDICATION: 54 year old with intra-abdominal fluid collections and suspect colonic diverticular abscesses. EXAM: CT GUIDED DRAINAGE OF INTRA-ABDOMINAL ABSCESS X 3 MEDICATIONS: Moderate sedation ANESTHESIA/SEDATION: 4.0 mg IV Versed 200 mcg IV Fentanyl Moderate Sedation Time:  94 minutes The patient was continuously monitored during the procedure by the interventional  radiology nurse under my direct supervision. COMPLICATIONS: None immediate. TECHNIQUE: The procedure was explained to the patient. The risks and benefits of the procedure were discussed and the patient's questions were addressed. Informed consent was obtained from the patient. Patient was placed supine on the CT scanner and CT images through the lower abdomen and pelvis were obtained. PROCEDURE: Collections in the left lower abdomen were identified and targeted. Left side of the abdomen was prepped with chlorhexidine and sterile field was created. Skin and soft tissues were anesthetized with 1% lidocaine. Using CT guidance, an 18 gauge trocar needle was directed into the most lateral component of the left lower abdominal collection. Yellow purulent fluid was aspirated. Stiff Amplatz wire was placed and the tract was dilated  to accommodate a 12 [place] multipurpose drain. Approximately 25 mL of yellow purulent fluid was removed from this collection. However, this collection appears to be separate from the adjacent air-fluid collection. This catheter was sutured to skin. Attention was directed to the left lower abdominal abscess containing gas that is medial to the recently placed drain. Skin was anesthetized more medial and an 18 gauge needle was directed into the air-fluid collection using CT guidance. Brown purulent fluid was aspirated. Stiff Amplatz wire was advanced into the collection and the tract was dilated to accommodate a 10.2 [place] multipurpose drain. Approximately 60 mL of brown purulent fluid was removed from this collection. Air was also aspirated from this collection. Catheter was sutured to skin. Attention was directed to the large collection in the right lower abdomen. The right side of the abdomen was prepped and draped in sterile fashion. Skin was anesthetized with 1% lidocaine. Using CT guidance, 18 gauge trocar needle was directed into the right lower abdominal collection. Cloudy yellow fluid was  removed. Stiff Amplatz wire was advanced into the collection and the tract was dilated to accommodate a 12 [place] multipurpose drain. Approximately 530 mL mL cloudy yellow fluid was removed from the right lower abdominal drain. Catheter was sutured to skin. Dressings were placed on all the drains. FINDINGS: Complex air-fluid collection in the left lower abdomen adjacent to the sigmoid colon. Large air-fluid collection in the right lower abdomen that extends to the midline. Drain #1 was placed in the lateral component of the left lower abdominal collection. This collection may be associated with the left adnexa or ovary. Although 25 mL of purulent fluid was removed from the lateral component, this collection did not appear to be contiguous with the adjacent air-fluid collection. Therefore, Drain #2 was placed within the air-fluid collection in the left lower abdominal collection. 60 mL of brown purulent fluid was removed from this drain. Large amount of cloudy yellow fluid was removed from Drain #3 in the right lower abdomen. IMPRESSION: Successful placement of 3 drains within the lower abdominal abscess collections using CT guidance. Electronically Signed   By: Markus Daft M.D.   On: 10/31/2019 17:34   CT IMAGE GUIDED DRAINAGE BY PERCUTANEOUS CATHETER  Result Date: 10/31/2019 INDICATION: 54 year old with intra-abdominal fluid collections and suspect colonic diverticular abscesses. EXAM: CT GUIDED DRAINAGE OF INTRA-ABDOMINAL ABSCESS X 3 MEDICATIONS: Moderate sedation ANESTHESIA/SEDATION: 4.0 mg IV Versed 200 mcg IV Fentanyl Moderate Sedation Time:  94 minutes The patient was continuously monitored during the procedure by the interventional radiology nurse under my direct supervision. COMPLICATIONS: None immediate. TECHNIQUE: The procedure was explained to the patient. The risks and benefits of the procedure were discussed and the patient's questions were addressed. Informed consent was obtained from the patient.  Patient was placed supine on the CT scanner and CT images through the lower abdomen and pelvis were obtained. PROCEDURE: Collections in the left lower abdomen were identified and targeted. Left side of the abdomen was prepped with chlorhexidine and sterile field was created. Skin and soft tissues were anesthetized with 1% lidocaine. Using CT guidance, an 18 gauge trocar needle was directed into the most lateral component of the left lower abdominal collection. Yellow purulent fluid was aspirated. Stiff Amplatz wire was placed and the tract was dilated to accommodate a 12 [place] multipurpose drain. Approximately 25 mL of yellow purulent fluid was removed from this collection. However, this collection appears to be separate from the adjacent air-fluid collection. This catheter was sutured to skin. Attention  was directed to the left lower abdominal abscess containing gas that is medial to the recently placed drain. Skin was anesthetized more medial and an 18 gauge needle was directed into the air-fluid collection using CT guidance. Brown purulent fluid was aspirated. Stiff Amplatz wire was advanced into the collection and the tract was dilated to accommodate a 10.2 [place] multipurpose drain. Approximately 60 mL of brown purulent fluid was removed from this collection. Air was also aspirated from this collection. Catheter was sutured to skin. Attention was directed to the large collection in the right lower abdomen. The right side of the abdomen was prepped and draped in sterile fashion. Skin was anesthetized with 1% lidocaine. Using CT guidance, 18 gauge trocar needle was directed into the right lower abdominal collection. Cloudy yellow fluid was removed. Stiff Amplatz wire was advanced into the collection and the tract was dilated to accommodate a 12 [place] multipurpose drain. Approximately 530 mL mL cloudy yellow fluid was removed from the right lower abdominal drain. Catheter was sutured to skin. Dressings were  placed on all the drains. FINDINGS: Complex air-fluid collection in the left lower abdomen adjacent to the sigmoid colon. Large air-fluid collection in the right lower abdomen that extends to the midline. Drain #1 was placed in the lateral component of the left lower abdominal collection. This collection may be associated with the left adnexa or ovary. Although 25 mL of purulent fluid was removed from the lateral component, this collection did not appear to be contiguous with the adjacent air-fluid collection. Therefore, Drain #2 was placed within the air-fluid collection in the left lower abdominal collection. 60 mL of brown purulent fluid was removed from this drain. Large amount of cloudy yellow fluid was removed from Drain #3 in the right lower abdomen. IMPRESSION: Successful placement of 3 drains within the lower abdominal abscess collections using CT guidance. Electronically Signed   By: Markus Daft M.D.   On: 10/31/2019 17:34   CT IMAGE GUIDED DRAINAGE BY PERCUTANEOUS CATHETER  Result Date: 10/31/2019 INDICATION: 54 year old with intra-abdominal fluid collections and suspect colonic diverticular abscesses. EXAM: CT GUIDED DRAINAGE OF INTRA-ABDOMINAL ABSCESS X 3 MEDICATIONS: Moderate sedation ANESTHESIA/SEDATION: 4.0 mg IV Versed 200 mcg IV Fentanyl Moderate Sedation Time:  94 minutes The patient was continuously monitored during the procedure by the interventional radiology nurse under my direct supervision. COMPLICATIONS: None immediate. TECHNIQUE: The procedure was explained to the patient. The risks and benefits of the procedure were discussed and the patient's questions were addressed. Informed consent was obtained from the patient. Patient was placed supine on the CT scanner and CT images through the lower abdomen and pelvis were obtained. PROCEDURE: Collections in the left lower abdomen were identified and targeted. Left side of the abdomen was prepped with chlorhexidine and sterile field was  created. Skin and soft tissues were anesthetized with 1% lidocaine. Using CT guidance, an 18 gauge trocar needle was directed into the most lateral component of the left lower abdominal collection. Yellow purulent fluid was aspirated. Stiff Amplatz wire was placed and the tract was dilated to accommodate a 12 [place] multipurpose drain. Approximately 25 mL of yellow purulent fluid was removed from this collection. However, this collection appears to be separate from the adjacent air-fluid collection. This catheter was sutured to skin. Attention was directed to the left lower abdominal abscess containing gas that is medial to the recently placed drain. Skin was anesthetized more medial and an 18 gauge needle was directed into the air-fluid collection using CT guidance. [employer]  purulent fluid was aspirated. Stiff Amplatz wire was advanced into the collection and the tract was dilated to accommodate a 10.2 [place] multipurpose drain. Approximately 60 mL of brown purulent fluid was removed from this collection. Air was also aspirated from this collection. Catheter was sutured to skin. Attention was directed to the large collection in the right lower abdomen. The right side of the abdomen was prepped and draped in sterile fashion. Skin was anesthetized with 1% lidocaine. Using CT guidance, 18 gauge trocar needle was directed into the right lower abdominal collection. Cloudy yellow fluid was removed. Stiff Amplatz wire was advanced into the collection and the tract was dilated to accommodate a 12 [place] multipurpose drain. Approximately 530 mL mL cloudy yellow fluid was removed from the right lower abdominal drain. Catheter was sutured to skin. Dressings were placed on all the drains. FINDINGS: Complex air-fluid collection in the left lower abdomen adjacent to the sigmoid colon. Large air-fluid collection in the right lower abdomen that extends to the midline. Drain #1 was placed in the lateral component of the left lower  abdominal collection. This collection may be associated with the left adnexa or ovary. Although 25 mL of purulent fluid was removed from the lateral component, this collection did not appear to be contiguous with the adjacent air-fluid collection. Therefore, Drain #2 was placed within the air-fluid collection in the left lower abdominal collection. 60 mL of brown purulent fluid was removed from this drain. Large amount of cloudy yellow fluid was removed from Drain #3 in the right lower abdomen. IMPRESSION: Successful placement of 3 drains within the lower abdominal abscess collections using CT guidance. Electronically Signed   By: Markus Daft M.D.   On: 10/31/2019 17:34        Scheduled Meds: . atorvastatin  40 mg Oral Daily  . cholecalciferol  2,000 Units Oral Daily  . heparin  5,000 Units Subcutaneous Q8H  . insulin aspart  0-20 Units Subcutaneous Q4H  . insulin aspart  10 Units Subcutaneous TID WC  . insulin glargine  60 Units Subcutaneous QHS  . mometasone-formoterol  2 puff Inhalation BID  . montelukast  10 mg Oral QHS  . sertraline  200 mg Oral Daily  . sodium chloride flush  3 mL Intravenous Q12H  . sodium chloride flush  5 mL Intracatheter Q8H  . sodium chloride flush  5 mL Intracatheter Q8H   Continuous Infusions: . piperacillin-tazobactam (ZOSYN)  IV 3.375 g (11/01/19 1322)    Assessment & Plan:   Perforated sigmoid diverticulitis complicated with a large abscess CT abdomen pelvis without contrast showed perforated sigmoid diverticulitis with large 12 cm abscess and complex fluid collections into the pelvis.C. difficile toxinNegative.Continue Zosyn and Dilaudid prn for pain.Blood cultures from 1/9 -ve so far.Seen by G, apprec recs.VIR consulted to for abscess drainage--Drains placed on 1/10, apprec recs Drain #1 in left lower abdominal abscess (lateral) and may involve left ovary and adnexa. 25 ml of yellow purulent fluid removed. Drain #2 in left lower abdominal abscess  (medial) that contains gas and removed 60 ml of brown purulent fluid. Drain #3 in right lower abdomen removed 530 ml of cloudy yellow fluid. IR sent fluid from Drain #1 and #2 for culture. Will Consider resuming anticoagulation with IV heparin/ Eliquis if and when no more procedures necessary--will d/w IR.   AKI on CKD stage IIIa :Baseline creatinine~1.1. Creatinine elevated to 1.86 on admission, peaked to 2.2 and now slightly downtrending.Likely prerenal/renal in setting of poor p.o. intake/infection-Renally dose all  medications. Avoid nephrotoxic drugs.- Hold ACEI, Continue IV fluids.  Diabetes Mellitus with Hyperglycemia: in the setting of acute infection. On insulin NovoLog 44 units 3 times daily with meals, insulin Lantus 80 units at bedtime at home. Currently on Lantus 60 units at bedtime/SSI. Resume mealtime insulin when diet advanced  Metabolic acidosis-MostlyNonanion gap. Bicarbonate 18.Likely in setting of diarrhea/infection  Pseudo-hyponatremia ; resolved with BG correction.  History of pulmonary embolism-On Eliquis at home. Held in anticipation of procedure.D/W GS--will resume anticoagulation with  heparin drip (if okay per VIR)  Hypertension-On lisinopril at home. Held in setting of AKI.-Monitor for now. Pain control.  Chronic pain-On oxycodone at home. Currently-Dilaudid as needed for pain control--change back to PO soon  Asthma: Stable with no new oxygen requirement.-Continue home inhalers  Morbid obesity: Counseled regarding weight loss, f/u PCP  Pyuria with rare bacteruria: recently treated for UTI. Now on abx for #1.  Depression: Denies suicidal/homicidal throughts. Resumed Zoloft as now on CLD.   DVT prophylaxis:Heparin SQ(on Eliquis for PE which is on hold) Code Status:DNR(discussed with patient during this hospitalization, patient states she is not happy with her quality of life at baseline and would like to be DO NOT RESUSCITATE at this  time) Family / Patient Communication: d/w patient. D/W GS, bedside nurse Disposition Plan: TBD     LOS: 2 days    Time spent: 40 minutes    Guilford Shi, MD [facility] Pager [phone]  If 7PM-7AM, please contact night-coverage [email or web address] Password [email or web address] 11/01/2019, 1:46 PM

## 2019-11-01 NOTE — Progress Notes (Signed)
Referring Physician(s): Dr. Harlow Asa  Supervising Physician: Jacqulynn Cadet  Patient Status:  [name] - In-pt  Chief Complaint: Follow up abdominal drain placement x 3 on 10/31/19 with Dr. Anselm Pancoast  Subjective:  Patient laying in bed, reports 8/10 abdominal pain currently which is not helped with current pain medication regimen. She is asking for different pain medication. She states her pain is 10+/10 at it's worse and the current pain medication makes her sleepy but does not improve her pain much. She was previously taking oxycodone at home for back pain and tried to take one when her stomach pain began however this did not change her pain at all. She has been able to tolerate liquids today without any nausea or vomiting. RN at bedside emptying drain during my exam.   Allergies: Cafergot, Glucophage [metformin hcl], and Canagliflozin  Medications: Prior to Admission medications   Medication Sig Start Date End Date Taking? Authorizing Provider  acetaminophen (TYLENOL) 500 MG tablet Take 1,000 mg by mouth every 6 (six) hours as needed for moderate pain.   Yes [provider]  albuterol (ACCUNEB) 1.25 MG/3ML nebulizer solution Inhale 1 ampule into the lungs 3 (three) times daily as needed for wheezing or shortness of breath.  12/12/10  Yes [provider]  albuterol (VENTOLIN HFA) 108 (90 Base) MCG/ACT inhaler Inhale 2 puffs into the lungs every 4 (four) hours as needed for wheezing or shortness of breath.  01/04/14  Yes [provider]  amoxicillin-clavulanate (AUGMENTIN) 875-125 MG tablet Take 1 tablet by mouth 2 (two) times daily. 10/25/19  Yes [provider]  apixaban (ELIQUIS) 5 MG TABS tablet Take two tabs twice a day for 7 days, then take one tab twice a day for a year. Patient taking differently: Take 2.5 mg by mouth 2 (two) times daily.  03/24/17  Yes Florencia Reasons, MD  aspirin EC 81 MG tablet Take 81 mg by mouth daily.   Yes [provider]    atorvastatin (LIPITOR) 40 MG tablet Take 40 mg by mouth daily.   Yes [provider]  buPROPion (WELLBUTRIN XL) 150 MG 24 hr tablet Take 150 mg by mouth every morning. 09/13/19  Yes [provider]  carisoprodol (SOMA) 350 MG tablet TAKE 1 TABLET (350 MG TOTAL) BY MOUTH 3 (THREE) TIMES DAILY. 09/15/19  Yes Meredith Staggers, MD  Cholecalciferol (VITAMIN D3) 2000 units capsule Take 2,000 Units by mouth daily.    Yes [provider]  Fluticasone-Salmeterol (ADVAIR) 500-50 MCG/DOSE AEPB Inhale 1 puff into the lungs every 12 (twelve) hours.   Yes [provider]  insulin aspart (NOVOLOG FLEXPEN) 100 UNIT/ML FlexPen Inject 15 Units into the skin 3 (three) times daily with meals. Patient taking differently: Inject 44 Units into the skin 3 (three) times daily with meals.  03/24/17  Yes Florencia Reasons, MD  insulin glargine (LANTUS) 100 UNIT/ML injection Inject 80 Units into the skin at bedtime.    Yes [provider]  lisinopril (PRINIVIL,ZESTRIL) 20 MG tablet Take 1 tablet (20 mg total) by mouth daily. 03/24/17 10/30/19 Yes Florencia Reasons, MD  montelukast (SINGULAIR) 10 MG tablet Take 10 mg by mouth at bedtime.    Yes [provider]  oxybutynin (DITROPAN) 5 MG tablet Take 5 mg by mouth 2 (two) times daily.  03/13/16  Yes [provider]  Oxycodone HCl 10 MG TABS Take 1 tablet (10 mg total) by mouth every 8 (eight) hours as needed. Patient taking differently: Take 10 mg by  mouth every 8 (eight) hours as needed (pain).  08/31/19  Yes Bayard Hugger, NP  promethazine (PHENERGAN) 25 MG tablet Take 25 mg by mouth every 6 (six) hours as needed for nausea or vomiting.    Yes [provider]  senna-docusate (SENOKOT-S) 8.6-50 MG tablet Take 1 tablet by mouth at bedtime. 03/24/17  Yes Florencia Reasons, MD  sertraline (ZOLOFT) 100 MG tablet Take 200 mg by mouth daily.    Yes [provider]  BD PEN NEEDLE NANO U/F 32G X 4 MM MISC 2 (two) times daily. as  directed 03/13/16   [provider]  nystatin cream (MYCOSTATIN) Apply 1 application topically 2 (two) times daily. Patient not taking: Reported on 10/31/2019 03/11/18   Huel Cote, NP     Vital Signs: BP 117/66 (BP Location: Right Wrist)   Pulse 88   Temp 98.4 F (36.9 C) (Oral)   Resp 19   Ht 5\' 8"  (1.727 m)   Wt (!) 387 lb 9.1 oz (175.8 kg)   SpO2 94%   BMI 58.93 kg/m   Physical Exam Vitals and nursing note reviewed.  Constitutional:      General: She is not in acute distress.    Appearance: She is obese. She is ill-appearing.  HENT:     Head: Normocephalic.  Cardiovascular:     Rate and Rhythm: Normal rate.  Pulmonary:     Effort: Pulmonary effort is normal.  Abdominal:     Palpations: Abdomen is soft.     Tenderness: There is abdominal tenderness (mostly pelvic/lower abdomen).     Comments: (+) right lower abdominal drain to suction with 30 cc of yellow fluid with purulent material - flushes/aspirates easily. Suction bulb in tact. Insertion site c/d/i without leakage or bleeding. (+) left drain labeled #1 to suction with light tan purulent output - flushes/aspirates easily. Suction bulb in tact. Insertion site c/d/i without leakage or bleeding. (+) left drain labeled #2 to suction with serosanguineous output - difficult to flush initially however this improved with aggressive flushing/aspiration today. Suction bulb in tact but was inverted initially. Insertion site c/d/i without leakage or bleeding.   Skin:    General: Skin is warm and dry.  Neurological:     Mental Status: She is alert. Mental status is at baseline.  Psychiatric:        Mood and Affect: Mood normal.        Behavior: Behavior normal.     Imaging: CT Abdomen Pelvis Wo Contrast  Result Date: 10/30/2019 CLINICAL DATA:  Right lower quadrant abdominal pain. EXAM: CT ABDOMEN AND PELVIS WITHOUT CONTRAST TECHNIQUE: Multidetector CT imaging of the abdomen and pelvis was performed following the  standard protocol without IV contrast. COMPARISON:  March 21, 2017. FINDINGS: Lower chest: There are few small stable bibasilar pulmonary nodules.The heart size is normal. Hepatobiliary: There is decreased hepatic attenuation suggestive of hepatic steatosis. Status post cholecystectomy.There is no biliary ductal dilation. Pancreas: Normal contours without ductal dilatation. No peripancreatic fluid collection. Spleen: No splenic laceration or hematoma. Adrenals/Urinary Tract: --Adrenal glands: No adrenal hemorrhage. --Right kidney/ureter: No hydronephrosis or perinephric hematoma. --Left kidney/ureter: No hydronephrosis or perinephric hematoma. --Urinary bladder: Unremarkable. Stomach/Bowel: --Stomach/Duodenum: No hiatal hernia or other gastric abnormality. Normal duodenal course and caliber. --Small bowel: No dilatation or inflammation. --Colon: Findings are concerning for perforated sigmoid colitis or diverticulitis. There is wall thickening of the sigmoid colon with a large adjacent abscess superiorly measuring approximately 12 x 8 by 9.5 cm. This collection  appears to extend inferiorly and cross the patient's midline to an additional collection measuring approximately 5.4 x 3.4 cm. The patient's left ovary is not reliably identified and may abut these collections. --Appendix: Normal. Vascular/Lymphatic: Atherosclerotic calcification is present within the non-aneurysmal abdominal aorta, without hemodynamically significant stenosis. --there are mildly enlarged retroperitoneal lymph nodes, presumably reactive. --there are mildly enlarged mesenteric lymph nodes, presumably reactive. --there are mildly enlarged pelvic lymph nodes, presumably reactive. Reproductive: Unremarkable Other: No ascites or free air. There is a small volume of free fluid in the patient's pelvis. Musculoskeletal. No acute displaced fractures. IMPRESSION: 1. Findings are most consistent with perforated sigmoid diverticulitis/colitis. There is a  large 12 cm abscess as detailed above in addition to other complex fluid collections extending into the patient's pelvis. 2. Normal appendix in the right lower quadrant. 3. Status post cholecystectomy. Aortic Atherosclerosis (ICD10-I70.0). Electronically Signed   By: Constance Holster M.D.   On: 10/30/2019 20:07   CT IMAGE GUIDED DRAINAGE BY PERCUTANEOUS CATHETER  Result Date: 10/31/2019 INDICATION: 54 year old with intra-abdominal fluid collections and suspect colonic diverticular abscesses. EXAM: CT GUIDED DRAINAGE OF INTRA-ABDOMINAL ABSCESS X 3 MEDICATIONS: Moderate sedation ANESTHESIA/SEDATION: 4.0 mg IV Versed 200 mcg IV Fentanyl Moderate Sedation Time:  94 minutes The patient was continuously monitored during the procedure by the interventional radiology nurse under my direct supervision. COMPLICATIONS: None immediate. TECHNIQUE: The procedure was explained to the patient. The risks and benefits of the procedure were discussed and the patient's questions were addressed. Informed consent was obtained from the patient. Patient was placed supine on the CT scanner and CT images through the lower abdomen and pelvis were obtained. PROCEDURE: Collections in the left lower abdomen were identified and targeted. Left side of the abdomen was prepped with chlorhexidine and sterile field was created. Skin and soft tissues were anesthetized with 1% lidocaine. Using CT guidance, an 18 gauge trocar needle was directed into the most lateral component of the left lower abdominal collection. Yellow purulent fluid was aspirated. Stiff Amplatz wire was placed and the tract was dilated to accommodate a 12 [place] multipurpose drain. Approximately 25 mL of yellow purulent fluid was removed from this collection. However, this collection appears to be separate from the adjacent air-fluid collection. This catheter was sutured to skin. Attention was directed to the left lower abdominal abscess containing gas that is medial to the  recently placed drain. Skin was anesthetized more medial and an 18 gauge needle was directed into the air-fluid collection using CT guidance. Brown purulent fluid was aspirated. Stiff Amplatz wire was advanced into the collection and the tract was dilated to accommodate a 10.2 [place] multipurpose drain. Approximately 60 mL of brown purulent fluid was removed from this collection. Air was also aspirated from this collection. Catheter was sutured to skin. Attention was directed to the large collection in the right lower abdomen. The right side of the abdomen was prepped and draped in sterile fashion. Skin was anesthetized with 1% lidocaine. Using CT guidance, 18 gauge trocar needle was directed into the right lower abdominal collection. Cloudy yellow fluid was removed. Stiff Amplatz wire was advanced into the collection and the tract was dilated to accommodate a 12 [place] multipurpose drain. Approximately 530 mL mL cloudy yellow fluid was removed from the right lower abdominal drain. Catheter was sutured to skin. Dressings were placed on all the drains. FINDINGS: Complex air-fluid collection in the left lower abdomen adjacent to the sigmoid colon. Large air-fluid collection in the right lower abdomen that extends to  the midline. Drain #1 was placed in the lateral component of the left lower abdominal collection. This collection may be associated with the left adnexa or ovary. Although 25 mL of purulent fluid was removed from the lateral component, this collection did not appear to be contiguous with the adjacent air-fluid collection. Therefore, Drain #2 was placed within the air-fluid collection in the left lower abdominal collection. 60 mL of brown purulent fluid was removed from this drain. Large amount of cloudy yellow fluid was removed from Drain #3 in the right lower abdomen. IMPRESSION: Successful placement of 3 drains within the lower abdominal abscess collections using CT guidance. Electronically Signed   By:  Markus Daft M.D.   On: 10/31/2019 17:34   CT IMAGE GUIDED DRAINAGE BY PERCUTANEOUS CATHETER  Result Date: 10/31/2019 INDICATION: 54 year old with intra-abdominal fluid collections and suspect colonic diverticular abscesses. EXAM: CT GUIDED DRAINAGE OF INTRA-ABDOMINAL ABSCESS X 3 MEDICATIONS: Moderate sedation ANESTHESIA/SEDATION: 4.0 mg IV Versed 200 mcg IV Fentanyl Moderate Sedation Time:  94 minutes The patient was continuously monitored during the procedure by the interventional radiology nurse under my direct supervision. COMPLICATIONS: None immediate. TECHNIQUE: The procedure was explained to the patient. The risks and benefits of the procedure were discussed and the patient's questions were addressed. Informed consent was obtained from the patient. Patient was placed supine on the CT scanner and CT images through the lower abdomen and pelvis were obtained. PROCEDURE: Collections in the left lower abdomen were identified and targeted. Left side of the abdomen was prepped with chlorhexidine and sterile field was created. Skin and soft tissues were anesthetized with 1% lidocaine. Using CT guidance, an 18 gauge trocar needle was directed into the most lateral component of the left lower abdominal collection. Yellow purulent fluid was aspirated. Stiff Amplatz wire was placed and the tract was dilated to accommodate a 12 [place] multipurpose drain. Approximately 25 mL of yellow purulent fluid was removed from this collection. However, this collection appears to be separate from the adjacent air-fluid collection. This catheter was sutured to skin. Attention was directed to the left lower abdominal abscess containing gas that is medial to the recently placed drain. Skin was anesthetized more medial and an 18 gauge needle was directed into the air-fluid collection using CT guidance. Brown purulent fluid was aspirated. Stiff Amplatz wire was advanced into the collection and the tract was dilated to accommodate a 10.2  [place] multipurpose drain. Approximately 60 mL of brown purulent fluid was removed from this collection. Air was also aspirated from this collection. Catheter was sutured to skin. Attention was directed to the large collection in the right lower abdomen. The right side of the abdomen was prepped and draped in sterile fashion. Skin was anesthetized with 1% lidocaine. Using CT guidance, 18 gauge trocar needle was directed into the right lower abdominal collection. Cloudy yellow fluid was removed. Stiff Amplatz wire was advanced into the collection and the tract was dilated to accommodate a 12 [place] multipurpose drain. Approximately 530 mL mL cloudy yellow fluid was removed from the right lower abdominal drain. Catheter was sutured to skin. Dressings were placed on all the drains. FINDINGS: Complex air-fluid collection in the left lower abdomen adjacent to the sigmoid colon. Large air-fluid collection in the right lower abdomen that extends to the midline. Drain #1 was placed in the lateral component of the left lower abdominal collection. This collection may be associated with the left adnexa or ovary. Although 25 mL of purulent fluid was removed from the lateral  component, this collection did not appear to be contiguous with the adjacent air-fluid collection. Therefore, Drain #2 was placed within the air-fluid collection in the left lower abdominal collection. 60 mL of brown purulent fluid was removed from this drain. Large amount of cloudy yellow fluid was removed from Drain #3 in the right lower abdomen. IMPRESSION: Successful placement of 3 drains within the lower abdominal abscess collections using CT guidance. Electronically Signed   By: Markus Daft M.D.   On: 10/31/2019 17:34   CT IMAGE GUIDED DRAINAGE BY PERCUTANEOUS CATHETER  Result Date: 10/31/2019 INDICATION: 54 year old with intra-abdominal fluid collections and suspect colonic diverticular abscesses. EXAM: CT GUIDED DRAINAGE OF INTRA-ABDOMINAL ABSCESS  X 3 MEDICATIONS: Moderate sedation ANESTHESIA/SEDATION: 4.0 mg IV Versed 200 mcg IV Fentanyl Moderate Sedation Time:  94 minutes The patient was continuously monitored during the procedure by the interventional radiology nurse under my direct supervision. COMPLICATIONS: None immediate. TECHNIQUE: The procedure was explained to the patient. The risks and benefits of the procedure were discussed and the patient's questions were addressed. Informed consent was obtained from the patient. Patient was placed supine on the CT scanner and CT images through the lower abdomen and pelvis were obtained. PROCEDURE: Collections in the left lower abdomen were identified and targeted. Left side of the abdomen was prepped with chlorhexidine and sterile field was created. Skin and soft tissues were anesthetized with 1% lidocaine. Using CT guidance, an 18 gauge trocar needle was directed into the most lateral component of the left lower abdominal collection. Yellow purulent fluid was aspirated. Stiff Amplatz wire was placed and the tract was dilated to accommodate a 12 [place] multipurpose drain. Approximately 25 mL of yellow purulent fluid was removed from this collection. However, this collection appears to be separate from the adjacent air-fluid collection. This catheter was sutured to skin. Attention was directed to the left lower abdominal abscess containing gas that is medial to the recently placed drain. Skin was anesthetized more medial and an 18 gauge needle was directed into the air-fluid collection using CT guidance. Brown purulent fluid was aspirated. Stiff Amplatz wire was advanced into the collection and the tract was dilated to accommodate a 10.2 [place] multipurpose drain. Approximately 60 mL of brown purulent fluid was removed from this collection. Air was also aspirated from this collection. Catheter was sutured to skin. Attention was directed to the large collection in the right lower abdomen. The right side of the  abdomen was prepped and draped in sterile fashion. Skin was anesthetized with 1% lidocaine. Using CT guidance, 18 gauge trocar needle was directed into the right lower abdominal collection. Cloudy yellow fluid was removed. Stiff Amplatz wire was advanced into the collection and the tract was dilated to accommodate a 12 [place] multipurpose drain. Approximately 530 mL mL cloudy yellow fluid was removed from the right lower abdominal drain. Catheter was sutured to skin. Dressings were placed on all the drains. FINDINGS: Complex air-fluid collection in the left lower abdomen adjacent to the sigmoid colon. Large air-fluid collection in the right lower abdomen that extends to the midline. Drain #1 was placed in the lateral component of the left lower abdominal collection. This collection may be associated with the left adnexa or ovary. Although 25 mL of purulent fluid was removed from the lateral component, this collection did not appear to be contiguous with the adjacent air-fluid collection. Therefore, Drain #2 was placed within the air-fluid collection in the left lower abdominal collection. 60 mL of brown purulent fluid was removed from  this drain. Large amount of cloudy yellow fluid was removed from Drain #3 in the right lower abdomen. IMPRESSION: Successful placement of 3 drains within the lower abdominal abscess collections using CT guidance. Electronically Signed   By: Markus Daft M.D.   On: 10/31/2019 17:34    Labs:  CBC: Recent Labs    04/26/19 1205 10/30/19 1748 10/31/19 0558 11/01/19 0526  WBC 8.9 26.6* 18.0* 21.5*  HGB 13.1 12.1 9.8* 9.6*  HCT 41.3 41.1 32.4* 32.0*  PLT 240 496* 356 317    COAGS: Recent Labs    10/31/19 1056 11/01/19 0526  INR 1.4* 1.4*    BMP: Recent Labs    04/26/19 1205 10/30/19 1748 10/31/19 0558 11/01/19 0526  NA 141 129* 133* 138  K 5.6* 5.1 4.7 4.1  CL 104 98 101 103  CO2 29 18* 22 22  GLUCOSE 161* 398* 307* 162*  BUN 17 26* 36* 44*  CALCIUM 9.1  9.1 8.3* 8.1*  CREATININE 1.14* 1.86* 2.21* 1.98*  GFRNONAA 55* 30* 25* 28*  GFRAA >60 35* 29* 33*    LIVER FUNCTION TESTS: Recent Labs    10/30/19 1748 11/01/19 0526  BILITOT 1.2 0.6  AST 20 23  ALT 39 25  ALKPHOS 97 83  PROT 7.5 6.1*  ALBUMIN 3.1* 2.2*    Assessment and Plan:  54 y/o F s/p abdominal drain placement x 3 for perforated sigmoid diverticulitis on 10/31/19 by Dr. Anselm Pancoast who was seen today for routine drain follow up. Patient complains of poorly controlled abdominal pain today - she was encouraged to discuss this with her primary team.  All drains appear to be functioning well currently - all were flushed and aspirated on exam today. No pain with flushing of any of the drains. Insertion sites are all unremarkable. Per I/O the drain labeled left other which is actually the right sided drain with 560 cc yellow fluid with some purulent material, left lateral drain (12 fr, labeled #1) with 225 cc light tan output and left medial drain (10 fr, labeled #2) with 65 cc serosanguineous output.  Preliminary cultures of left lateral and left medial drains pending - she is currently receiving Zosyn per primary team. WBC trending up to 21.5 today (previously 18.0), afebrile, hgb trending down slightly to 9.6 from 9.8 yesterday.   Continue current drain management - flush drains TID with 5 cc NS, record output Qshift, call IR if difficulty flushing or if there are other drain concerns.  IR will continue to follow. Please call with questions or concerns.   Electronically Signed: Joaquim Nam, PA-C 11/01/2019, 2:39 PM   I spent a total of 25 Minutes at the the patient's bedside AND on the patient's hospital floor or unit, greater than 50% of which was counseling/coordinating care for abdominal abscess drains x 3 follow up.

## 2019-11-01 NOTE — Progress Notes (Signed)
CC: Acute diverticulitis with abscess formation.  Subjective: She is feeling better than when she came in but still having pain lower abdomen.  She has a lot of drainage as listed below.    Objective: Vital signs in last 24 hours: Temp:  [99.1 F (37.3 C)-99.9 F (37.7 C)] 99.1 F (37.3 C) (01/11 0417) Pulse Rate:  [86-98] 86 (01/11 0417) Resp:  [16-28] 16 (01/11 0417) BP: (87-128)/(47-88) 122/53 (01/11 0417) SpO2:  [93 %-98 %] 94 % (01/11 0417) Last BM Date: (P) 10/31/19 N.p.o. 808 IV recorded Urine 100 Drains 850 #1 left abdomen 225 #2 left abdomen 65 #3 left 560 BM x1 T-max 99.9.  Vital signs are stable. Glucose 138-162 Creatinine 1.98 WBC 21.5 H/H 9.6/32 INR 1.4  Intake/Output from previous day: 01/10 0701 - 01/11 0700 In: 808 [I.V.:675; IV Piggyback:111] Out: 950 [Urine:100; Drains:850] Intake/Output this shift: No intake/output data recorded.  General appearance: alert, cooperative and no distress Resp: clear to auscultation bilaterally GI: 3 drains with drainage as listed above.  she appears less tender than when she came in to the hospital.    Lab Results:  Recent Labs    10/31/19 0558 11/01/19 0526  WBC 18.0* 21.5*  HGB 9.8* 9.6*  HCT 32.4* 32.0*  PLT 356 317    BMET Recent Labs    10/31/19 0558 11/01/19 0526  NA 133* 138  K 4.7 4.1  CL 101 103  CO2 22 22  GLUCOSE 307* 162*  BUN 36* 44*  CREATININE 2.21* 1.98*  CALCIUM 8.3* 8.1*   PT/INR Recent Labs    10/31/19 1056 11/01/19 0526  LABPROT 17.3* 17.2*  INR 1.4* 1.4*    Recent Labs  Lab 10/30/19 1748 11/01/19 0526  AST 20 23  ALT 39 25  ALKPHOS 97 83  BILITOT 1.2 0.6  PROT 7.5 6.1*  ALBUMIN 3.1* 2.2*     Lipase     Component Value Date/Time   LIPASE 23 10/30/2019 1748     Prior to Admission medications   Medication Sig Start Date End Date Taking? Authorizing Provider  acetaminophen (TYLENOL) 500 MG tablet Take 1,000 mg by mouth every 6 (six) hours as needed  for moderate pain.   Yes [provider]  albuterol (ACCUNEB) 1.25 MG/3ML nebulizer solution Inhale 1 ampule into the lungs 3 (three) times daily as needed for wheezing or shortness of breath.  12/12/10  Yes [provider]  albuterol (VENTOLIN HFA) 108 (90 Base) MCG/ACT inhaler Inhale 2 puffs into the lungs every 4 (four) hours as needed for wheezing or shortness of breath.  01/04/14  Yes [provider]  amoxicillin-clavulanate (AUGMENTIN) 875-125 MG tablet Take 1 tablet by mouth 2 (two) times daily. 10/25/19  Yes [provider]  apixaban (ELIQUIS) 5 MG TABS tablet Take two tabs twice a day for 7 days, then take one tab twice a day for a year. Patient taking differently: Take 2.5 mg by mouth 2 (two) times daily.  03/24/17  Yes Florencia Reasons, MD  aspirin EC 81 MG tablet Take 81 mg by mouth daily.   Yes [provider]  atorvastatin (LIPITOR) 40 MG tablet Take 40 mg by mouth daily.   Yes [provider]  buPROPion (WELLBUTRIN XL) 150 MG 24 hr tablet Take 150 mg by mouth every morning. 09/13/19  Yes [provider]  carisoprodol (SOMA) 350 MG tablet TAKE 1 TABLET (350 MG TOTAL) BY MOUTH 3 (THREE) TIMES DAILY. 09/15/19  Yes Meredith Staggers, MD  Cholecalciferol (VITAMIN D3) 2000 units capsule Take 2,000 Units by mouth daily.    Yes [provider]  Fluticasone-Salmeterol (ADVAIR) 500-50 MCG/DOSE AEPB Inhale 1 puff into the lungs every 12 (twelve) hours.   Yes [provider]  insulin aspart (NOVOLOG FLEXPEN) 100 UNIT/ML FlexPen Inject 15 Units into the skin 3 (three) times daily with meals. Patient taking differently: Inject 44 Units into the skin 3 (three) times daily with meals.  03/24/17  Yes Florencia Reasons, MD  insulin glargine (LANTUS) 100 UNIT/ML injection Inject 80 Units into the skin at bedtime.    Yes [provider]  lisinopril (PRINIVIL,ZESTRIL) 20 MG tablet Take 1 tablet (20 mg total) by mouth daily. 03/24/17 10/30/19 Yes  Florencia Reasons, MD  montelukast (SINGULAIR) 10 MG tablet Take 10 mg by mouth at bedtime.    Yes [provider]  oxybutynin (DITROPAN) 5 MG tablet Take 5 mg by mouth 2 (two) times daily.  03/13/16  Yes [provider]  Oxycodone HCl 10 MG TABS Take 1 tablet (10 mg total) by mouth every 8 (eight) hours as needed. Patient taking differently: Take 10 mg by mouth every 8 (eight) hours as needed (pain).  08/31/19  Yes Bayard Hugger, NP  promethazine (PHENERGAN) 25 MG tablet Take 25 mg by mouth every 6 (six) hours as needed for nausea or vomiting.    Yes [provider]  senna-docusate (SENOKOT-S) 8.6-50 MG tablet Take 1 tablet by mouth at bedtime. 03/24/17  Yes Florencia Reasons, MD  sertraline (ZOLOFT) 100 MG tablet Take 200 mg by mouth daily.    Yes [provider]  BD PEN NEEDLE NANO U/F 32G X 4 MM MISC 2 (two) times daily. as directed 03/13/16   [provider]  nystatin cream (MYCOSTATIN) Apply 1 application topically 2 (two) times daily. Patient not taking: Reported on 10/31/2019 03/11/18   Huel Cote, NP    Medications: . atorvastatin  40 mg Oral Daily  . cholecalciferol  2,000 Units Oral Daily  . heparin  5,000 Units Subcutaneous Q8H  . insulin aspart  0-20 Units Subcutaneous Q4H  . insulin aspart  10 Units Subcutaneous TID WC  . insulin glargine  60 Units Subcutaneous QHS  . mometasone-formoterol  2 puff Inhalation BID  . montelukast  10 mg Oral QHS  . sertraline  200 mg Oral Daily  . sodium chloride flush  3 mL Intravenous Q12H  . sodium chloride flush  5 mL Intracatheter Q8H  . sodium chloride flush  5 mL Intracatheter Q8H   . piperacillin-tazobactam (ZOSYN)  IV 3.375 g (11/01/19 0439)    Assessment/Plan C. difficile work-up for diarrhea Type 2 diabetes Hypertension Hx pulmonary embolism -chronic anticoagulation - Eliquis Hx of CVA with vision loss Chronic back pain - oxycodone/[facility] Asthma Sleep apnea BMI  58.93  Acute diverticulitis with large abscess IR drain placement times 31/10/21, Dr. Markus Daft: drain #1 left lower abdominal abscess may involve left ovary and adnexa -25 mL yellow purulent fluid drained Drain #2 left lower abdominal abscess medial containing gas and removed 60 mL of brown purulent fluid Drained #3 in the right lower abdomen removed 530 mL cloudy yellow fluid   FEN: Clear liquids/no IV is listed ID: Zosyn 1/9 >>day 3 DVT: SQ heparin Follow-up: TBD   Plan:  She has a lot of drainage and I would not up her diet at this point.  Continue clear liquids, antibiotics, drains, and IV fluids for now. Defer anticoagulation  to Medicine, but she should be able to go on IV heparin if OK with Radiology.      LOS: 2 days    Mccrae Speciale 11/01/2019 Please see Amion

## 2019-11-02 ENCOUNTER — Inpatient Hospital Stay (HOSPITAL_COMMUNITY): Payer: PPO

## 2019-11-02 ENCOUNTER — Ambulatory Visit: Payer: PPO | Admitting: Registered Nurse

## 2019-11-02 LAB — CBC
HCT: 32.8 % — ABNORMAL LOW (ref 36.0–46.0)
Hemoglobin: 9.8 g/dL — ABNORMAL LOW (ref 12.0–15.0)
MCH: 25.9 pg — ABNORMAL LOW (ref 26.0–34.0)
MCHC: 29.9 g/dL — ABNORMAL LOW (ref 30.0–36.0)
MCV: 86.8 fL (ref 80.0–100.0)
Platelets: 352 10*3/uL (ref 150–400)
RBC: 3.78 MIL/uL — ABNORMAL LOW (ref 3.87–5.11)
RDW: 15 % (ref 11.5–15.5)
WBC: 25.4 10*3/uL — ABNORMAL HIGH (ref 4.0–10.5)
nRBC: 0 % (ref 0.0–0.2)

## 2019-11-02 LAB — COMPREHENSIVE METABOLIC PANEL
ALT: 43 U/L (ref 0–44)
AST: 54 U/L — ABNORMAL HIGH (ref 15–41)
Albumin: 2.1 g/dL — ABNORMAL LOW (ref 3.5–5.0)
Alkaline Phosphatase: 178 U/L — ABNORMAL HIGH (ref 38–126)
Anion gap: 9 (ref 5–15)
BUN: 36 mg/dL — ABNORMAL HIGH (ref 6–20)
CO2: 22 mmol/L (ref 22–32)
Calcium: 8.3 mg/dL — ABNORMAL LOW (ref 8.9–10.3)
Chloride: 105 mmol/L (ref 98–111)
Creatinine, Ser: 1.56 mg/dL — ABNORMAL HIGH (ref 0.44–1.00)
GFR calc Af Amer: 44 mL/min — ABNORMAL LOW (ref >60)
GFR calc non Af Amer: 38 mL/min — ABNORMAL LOW (ref >60)
Glucose, Bld: 115 mg/dL — ABNORMAL HIGH (ref 70–99)
Potassium: 4.3 mmol/L (ref 3.5–5.1)
Sodium: 136 mmol/L (ref 135–145)
Total Bilirubin: 0.7 mg/dL (ref 0.3–1.2)
Total Protein: 6.4 g/dL — ABNORMAL LOW (ref 6.5–8.1)

## 2019-11-02 LAB — GLUCOSE, CAPILLARY
Glucose-Capillary: 99 mg/dL (ref 70–99)
Glucose-Capillary: 105 mg/dL — ABNORMAL HIGH (ref 70–99)
Glucose-Capillary: 110 mg/dL — ABNORMAL HIGH (ref 70–99)
Glucose-Capillary: 141 mg/dL — ABNORMAL HIGH (ref 70–99)
Glucose-Capillary: 99 mg/dL (ref 70–99)
Glucose-Capillary: 121 mg/dL — ABNORMAL HIGH (ref 70–99)
Glucose-Capillary: 118 mg/dL — ABNORMAL HIGH (ref 70–99)
Glucose-Capillary: 99 mg/dL (ref 70–99)

## 2019-11-02 LAB — PHOSPHORUS: Phosphorus: 4.4 mg/dL (ref 2.5–4.6)

## 2019-11-02 LAB — PROTIME-INR
INR: 1.3 — ABNORMAL HIGH (ref 0.8–1.2)
Prothrombin Time: 16 seconds — ABNORMAL HIGH (ref 11.4–15.2)

## 2019-11-02 LAB — MAGNESIUM: Magnesium: 1.8 mg/dL (ref 1.7–2.4)

## 2019-11-02 MED ORDER — OXYCODONE HCL 5 MG PO TABS
5.0000 mg | ORAL_TABLET | ORAL | Status: DC | PRN
  Administered 2019-11-02 – 2019-11-09 (×12): 10 mg via ORAL
  Filled 2019-11-02 (×12): qty 2

## 2019-11-02 MED ORDER — HEPARIN (PORCINE) 25000 UT/250ML-% IV SOLN
1900.0000 [IU]/h | INTRAVENOUS | Status: DC
  Administered 2019-11-02: 1900 [IU]/h via INTRAVENOUS
  Filled 2019-11-02 (×2): qty 250

## 2019-11-02 NOTE — Progress Notes (Signed)
Referring Physician(s): Dr. Harlow Asa  Supervising Physician: Jacqulynn Cadet  Patient Status:  [facility] - In-pt  Chief Complaint: Follow up abdominal drain placement x 3 on 10/31/19 with Dr. Anselm Pancoast  Subjective:  Patient sitting up in chair, liquid lunch tray on table - patient reports broth is too salty and does not like the juice flavor. She reports continued pain which is worse than yesterday and is "all over" - she mostly complains of pain in her back, although she also endorses abdominal pain. She states the drain on the top on the left side is painful to touch but the others are not very painful. RN at bedside emptying drains.   Allergies: Cafergot, Glucophage [metformin hcl], and Canagliflozin  Medications: Prior to Admission medications   Medication Sig Start Date End Date Taking? Authorizing Provider  acetaminophen (TYLENOL) 500 MG tablet Take 1,000 mg by mouth every 6 (six) hours as needed for moderate pain.   Yes [provider]  albuterol (ACCUNEB) 1.25 MG/3ML nebulizer solution Inhale 1 ampule into the lungs 3 (three) times daily as needed for wheezing or shortness of breath.  12/12/10  Yes [provider]  albuterol (VENTOLIN HFA) 108 (90 Base) MCG/ACT inhaler Inhale 2 puffs into the lungs every 4 (four) hours as needed for wheezing or shortness of breath.  01/04/14  Yes [provider]  amoxicillin-clavulanate (AUGMENTIN) 875-125 MG tablet Take 1 tablet by mouth 2 (two) times daily. 10/25/19  Yes [provider]  apixaban (ELIQUIS) 5 MG TABS tablet Take two tabs twice a day for 7 days, then take one tab twice a day for a year. Patient taking differently: Take 2.5 mg by mouth 2 (two) times daily.  03/24/17  Yes Florencia Reasons, MD  aspirin EC 81 MG tablet Take 81 mg by mouth daily.   Yes [provider]  atorvastatin (LIPITOR) 40 MG tablet Take 40 mg by mouth daily.   Yes [provider]  buPROPion (WELLBUTRIN XL) 150 MG 24 hr tablet Take  150 mg by mouth every morning. 09/13/19  Yes [provider]  carisoprodol (SOMA) 350 MG tablet TAKE 1 TABLET (350 MG TOTAL) BY MOUTH 3 (THREE) TIMES DAILY. 09/15/19  Yes Meredith Staggers, MD  Cholecalciferol (VITAMIN D3) 2000 units capsule Take 2,000 Units by mouth daily.    Yes [provider]  Fluticasone-Salmeterol (ADVAIR) 500-50 MCG/DOSE AEPB Inhale 1 puff into the lungs every 12 (twelve) hours.   Yes [provider]  insulin aspart (NOVOLOG FLEXPEN) 100 UNIT/ML FlexPen Inject 15 Units into the skin 3 (three) times daily with meals. Patient taking differently: Inject 44 Units into the skin 3 (three) times daily with meals.  03/24/17  Yes Florencia Reasons, MD  insulin glargine (LANTUS) 100 UNIT/ML injection Inject 80 Units into the skin at bedtime.    Yes [provider]  lisinopril (PRINIVIL,ZESTRIL) 20 MG tablet Take 1 tablet (20 mg total) by mouth daily. 03/24/17 10/30/19 Yes Florencia Reasons, MD  montelukast (SINGULAIR) 10 MG tablet Take 10 mg by mouth at bedtime.    Yes [provider]  oxybutynin (DITROPAN) 5 MG tablet Take 5 mg by mouth 2 (two) times daily.  03/13/16  Yes [provider]  Oxycodone HCl 10 MG TABS Take 1 tablet (10 mg total) by mouth every 8 (eight) hours as needed. Patient taking differently: Take 10 mg by mouth every 8 (eight) hours as needed (pain).  08/31/19  Yes Bayard Hugger, NP  promethazine (PHENERGAN) 25 MG  tablet Take 25 mg by mouth every 6 (six) hours as needed for nausea or vomiting.    Yes [provider]  senna-docusate (SENOKOT-S) 8.6-50 MG tablet Take 1 tablet by mouth at bedtime. 03/24/17  Yes Florencia Reasons, MD  sertraline (ZOLOFT) 100 MG tablet Take 200 mg by mouth daily.    Yes [provider]  BD PEN NEEDLE NANO U/F 32G X 4 MM MISC 2 (two) times daily. as directed 03/13/16   [provider]  nystatin cream (MYCOSTATIN) Apply 1 application topically 2 (two) times daily. Patient not taking: Reported  on 10/31/2019 03/11/18   Huel Cote, NP     Vital Signs: BP 121/64 (BP Location: Right Wrist)   Pulse 89   Temp 97.9 F (36.6 C) (Oral)   Resp 17   Ht 5\' 8"  (1.727 m)   Wt (!) 387 lb 9.1 oz (175.8 kg)   SpO2 97%   BMI 58.93 kg/m   Physical Exam Vitals and nursing note reviewed.  Constitutional:      General: She is not in acute distress.    Appearance: She is obese. She is ill-appearing.  HENT:     Head: Normocephalic.  Cardiovascular:     Rate and Rhythm: Normal rate.  Pulmonary:     Effort: Pulmonary effort is normal.  Abdominal:     General: There is no distension.     Palpations: Abdomen is soft.     Tenderness: There is abdominal tenderness (over drain insertion sites mostly).     Comments: (+) Left 12 Fr drain with 9 cc serosanguineous output - easily flushes but is painful, not most return with aspiration. No leakage/bleeding noted. Insertion site unremarkable. (+) Left 10 Fr drain with 10 cc purulent output - some resistance with flushing/aspirating however able to return significant amount of chunky purulent material after two attempts at flushing/aspiration. Not painful to flush. No leakage/bleeding noted. Insertion site unremarkable. (+) Right 12 Fr drain with 80 cc thin tan foul smelling output. Flushes/aspirates easily. Not painful to flush. No leakage/bleeding noted. Insertion site unremarkable.   Skin:    General: Skin is warm and dry.  Neurological:     Mental Status: She is alert. Mental status is at baseline.     Imaging: CT Abdomen Pelvis Wo Contrast  Result Date: 10/30/2019 CLINICAL DATA:  Right lower quadrant abdominal pain. EXAM: CT ABDOMEN AND PELVIS WITHOUT CONTRAST TECHNIQUE: Multidetector CT imaging of the abdomen and pelvis was performed following the standard protocol without IV contrast. COMPARISON:  March 21, 2017. FINDINGS: Lower chest: There are few small stable bibasilar pulmonary nodules.The heart size is normal. Hepatobiliary: There is  decreased hepatic attenuation suggestive of hepatic steatosis. Status post cholecystectomy.There is no biliary ductal dilation. Pancreas: Normal contours without ductal dilatation. No peripancreatic fluid collection. Spleen: No splenic laceration or hematoma. Adrenals/Urinary Tract: --Adrenal glands: No adrenal hemorrhage. --Right kidney/ureter: No hydronephrosis or perinephric hematoma. --Left kidney/ureter: No hydronephrosis or perinephric hematoma. --Urinary bladder: Unremarkable. Stomach/Bowel: --Stomach/Duodenum: No hiatal hernia or other gastric abnormality. Normal duodenal course and caliber. --Small bowel: No dilatation or inflammation. --Colon: Findings are concerning for perforated sigmoid colitis or diverticulitis. There is wall thickening of the sigmoid colon with a large adjacent abscess superiorly measuring approximately 12 x 8 by 9.5 cm. This collection appears to extend inferiorly and cross the patient's midline to an additional collection measuring approximately 5.4 x 3.4 cm. The patient's left ovary is not reliably identified and may abut these collections. --Appendix: Normal. Vascular/Lymphatic:  Atherosclerotic calcification is present within the non-aneurysmal abdominal aorta, without hemodynamically significant stenosis. --there are mildly enlarged retroperitoneal lymph nodes, presumably reactive. --there are mildly enlarged mesenteric lymph nodes, presumably reactive. --there are mildly enlarged pelvic lymph nodes, presumably reactive. Reproductive: Unremarkable Other: No ascites or free air. There is a small volume of free fluid in the patient's pelvis. Musculoskeletal. No acute displaced fractures. IMPRESSION: 1. Findings are most consistent with perforated sigmoid diverticulitis/colitis. There is a large 12 cm abscess as detailed above in addition to other complex fluid collections extending into the patient's pelvis. 2. Normal appendix in the right lower quadrant. 3. Status post  cholecystectomy. Aortic Atherosclerosis (ICD10-I70.0). Electronically Signed   By: Constance Holster M.D.   On: 10/30/2019 20:07   CT IMAGE GUIDED DRAINAGE BY PERCUTANEOUS CATHETER  Result Date: 10/31/2019 INDICATION: 54 year old with intra-abdominal fluid collections and suspect colonic diverticular abscesses. EXAM: CT GUIDED DRAINAGE OF INTRA-ABDOMINAL ABSCESS X 3 MEDICATIONS: Moderate sedation ANESTHESIA/SEDATION: 4.0 mg IV Versed 200 mcg IV Fentanyl Moderate Sedation Time:  94 minutes The patient was continuously monitored during the procedure by the interventional radiology nurse under my direct supervision. COMPLICATIONS: None immediate. TECHNIQUE: The procedure was explained to the patient. The risks and benefits of the procedure were discussed and the patient's questions were addressed. Informed consent was obtained from the patient. Patient was placed supine on the CT scanner and CT images through the lower abdomen and pelvis were obtained. PROCEDURE: Collections in the left lower abdomen were identified and targeted. Left side of the abdomen was prepped with chlorhexidine and sterile field was created. Skin and soft tissues were anesthetized with 1% lidocaine. Using CT guidance, an 18 gauge trocar needle was directed into the most lateral component of the left lower abdominal collection. Yellow purulent fluid was aspirated. Stiff Amplatz wire was placed and the tract was dilated to accommodate a 12 [place] multipurpose drain. Approximately 25 mL of yellow purulent fluid was removed from this collection. However, this collection appears to be separate from the adjacent air-fluid collection. This catheter was sutured to skin. Attention was directed to the left lower abdominal abscess containing gas that is medial to the recently placed drain. Skin was anesthetized more medial and an 18 gauge needle was directed into the air-fluid collection using CT guidance. Brown purulent fluid was aspirated. Stiff  Amplatz wire was advanced into the collection and the tract was dilated to accommodate a 10.2 [place] multipurpose drain. Approximately 60 mL of brown purulent fluid was removed from this collection. Air was also aspirated from this collection. Catheter was sutured to skin. Attention was directed to the large collection in the right lower abdomen. The right side of the abdomen was prepped and draped in sterile fashion. Skin was anesthetized with 1% lidocaine. Using CT guidance, 18 gauge trocar needle was directed into the right lower abdominal collection. Cloudy yellow fluid was removed. Stiff Amplatz wire was advanced into the collection and the tract was dilated to accommodate a 12 [place] multipurpose drain. Approximately 530 mL mL cloudy yellow fluid was removed from the right lower abdominal drain. Catheter was sutured to skin. Dressings were placed on all the drains. FINDINGS: Complex air-fluid collection in the left lower abdomen adjacent to the sigmoid colon. Large air-fluid collection in the right lower abdomen that extends to the midline. Drain #1 was placed in the lateral component of the left lower abdominal collection. This collection may be associated with the left adnexa or ovary. Although 25 mL of purulent fluid was removed  from the lateral component, this collection did not appear to be contiguous with the adjacent air-fluid collection. Therefore, Drain #2 was placed within the air-fluid collection in the left lower abdominal collection. 60 mL of brown purulent fluid was removed from this drain. Large amount of cloudy yellow fluid was removed from Drain #3 in the right lower abdomen. IMPRESSION: Successful placement of 3 drains within the lower abdominal abscess collections using CT guidance. Electronically Signed   By: Markus Daft M.D.   On: 10/31/2019 17:34   CT IMAGE GUIDED DRAINAGE BY PERCUTANEOUS CATHETER  Result Date: 10/31/2019 INDICATION: 54 year old with intra-abdominal fluid collections  and suspect colonic diverticular abscesses. EXAM: CT GUIDED DRAINAGE OF INTRA-ABDOMINAL ABSCESS X 3 MEDICATIONS: Moderate sedation ANESTHESIA/SEDATION: 4.0 mg IV Versed 200 mcg IV Fentanyl Moderate Sedation Time:  94 minutes The patient was continuously monitored during the procedure by the interventional radiology nurse under my direct supervision. COMPLICATIONS: None immediate. TECHNIQUE: The procedure was explained to the patient. The risks and benefits of the procedure were discussed and the patient's questions were addressed. Informed consent was obtained from the patient. Patient was placed supine on the CT scanner and CT images through the lower abdomen and pelvis were obtained. PROCEDURE: Collections in the left lower abdomen were identified and targeted. Left side of the abdomen was prepped with chlorhexidine and sterile field was created. Skin and soft tissues were anesthetized with 1% lidocaine. Using CT guidance, an 18 gauge trocar needle was directed into the most lateral component of the left lower abdominal collection. Yellow purulent fluid was aspirated. Stiff Amplatz wire was placed and the tract was dilated to accommodate a 12 [place] multipurpose drain. Approximately 25 mL of yellow purulent fluid was removed from this collection. However, this collection appears to be separate from the adjacent air-fluid collection. This catheter was sutured to skin. Attention was directed to the left lower abdominal abscess containing gas that is medial to the recently placed drain. Skin was anesthetized more medial and an 18 gauge needle was directed into the air-fluid collection using CT guidance. Brown purulent fluid was aspirated. Stiff Amplatz wire was advanced into the collection and the tract was dilated to accommodate a 10.2 [place] multipurpose drain. Approximately 60 mL of brown purulent fluid was removed from this collection. Air was also aspirated from this collection. Catheter was sutured to skin.  Attention was directed to the large collection in the right lower abdomen. The right side of the abdomen was prepped and draped in sterile fashion. Skin was anesthetized with 1% lidocaine. Using CT guidance, 18 gauge trocar needle was directed into the right lower abdominal collection. Cloudy yellow fluid was removed. Stiff Amplatz wire was advanced into the collection and the tract was dilated to accommodate a 12 [place] multipurpose drain. Approximately 530 mL mL cloudy yellow fluid was removed from the right lower abdominal drain. Catheter was sutured to skin. Dressings were placed on all the drains. FINDINGS: Complex air-fluid collection in the left lower abdomen adjacent to the sigmoid colon. Large air-fluid collection in the right lower abdomen that extends to the midline. Drain #1 was placed in the lateral component of the left lower abdominal collection. This collection may be associated with the left adnexa or ovary. Although 25 mL of purulent fluid was removed from the lateral component, this collection did not appear to be contiguous with the adjacent air-fluid collection. Therefore, Drain #2 was placed within the air-fluid collection in the left lower abdominal collection. 60 mL of brown purulent fluid  was removed from this drain. Large amount of cloudy yellow fluid was removed from Drain #3 in the right lower abdomen. IMPRESSION: Successful placement of 3 drains within the lower abdominal abscess collections using CT guidance. Electronically Signed   By: Markus Daft M.D.   On: 10/31/2019 17:34   CT IMAGE GUIDED DRAINAGE BY PERCUTANEOUS CATHETER  Result Date: 10/31/2019 INDICATION: 54 year old with intra-abdominal fluid collections and suspect colonic diverticular abscesses. EXAM: CT GUIDED DRAINAGE OF INTRA-ABDOMINAL ABSCESS X 3 MEDICATIONS: Moderate sedation ANESTHESIA/SEDATION: 4.0 mg IV Versed 200 mcg IV Fentanyl Moderate Sedation Time:  94 minutes The patient was continuously monitored during the  procedure by the interventional radiology nurse under my direct supervision. COMPLICATIONS: None immediate. TECHNIQUE: The procedure was explained to the patient. The risks and benefits of the procedure were discussed and the patient's questions were addressed. Informed consent was obtained from the patient. Patient was placed supine on the CT scanner and CT images through the lower abdomen and pelvis were obtained. PROCEDURE: Collections in the left lower abdomen were identified and targeted. Left side of the abdomen was prepped with chlorhexidine and sterile field was created. Skin and soft tissues were anesthetized with 1% lidocaine. Using CT guidance, an 18 gauge trocar needle was directed into the most lateral component of the left lower abdominal collection. Yellow purulent fluid was aspirated. Stiff Amplatz wire was placed and the tract was dilated to accommodate a 12 [place] multipurpose drain. Approximately 25 mL of yellow purulent fluid was removed from this collection. However, this collection appears to be separate from the adjacent air-fluid collection. This catheter was sutured to skin. Attention was directed to the left lower abdominal abscess containing gas that is medial to the recently placed drain. Skin was anesthetized more medial and an 18 gauge needle was directed into the air-fluid collection using CT guidance. Brown purulent fluid was aspirated. Stiff Amplatz wire was advanced into the collection and the tract was dilated to accommodate a 10.2 [place] multipurpose drain. Approximately 60 mL of brown purulent fluid was removed from this collection. Air was also aspirated from this collection. Catheter was sutured to skin. Attention was directed to the large collection in the right lower abdomen. The right side of the abdomen was prepped and draped in sterile fashion. Skin was anesthetized with 1% lidocaine. Using CT guidance, 18 gauge trocar needle was directed into the right lower abdominal  collection. Cloudy yellow fluid was removed. Stiff Amplatz wire was advanced into the collection and the tract was dilated to accommodate a 12 [place] multipurpose drain. Approximately 530 mL mL cloudy yellow fluid was removed from the right lower abdominal drain. Catheter was sutured to skin. Dressings were placed on all the drains. FINDINGS: Complex air-fluid collection in the left lower abdomen adjacent to the sigmoid colon. Large air-fluid collection in the right lower abdomen that extends to the midline. Drain #1 was placed in the lateral component of the left lower abdominal collection. This collection may be associated with the left adnexa or ovary. Although 25 mL of purulent fluid was removed from the lateral component, this collection did not appear to be contiguous with the adjacent air-fluid collection. Therefore, Drain #2 was placed within the air-fluid collection in the left lower abdominal collection. 60 mL of brown purulent fluid was removed from this drain. Large amount of cloudy yellow fluid was removed from Drain #3 in the right lower abdomen. IMPRESSION: Successful placement of 3 drains within the lower abdominal abscess collections using CT guidance. Electronically Signed  By: Markus Daft M.D.   On: 10/31/2019 17:34    Labs:  CBC: Recent Labs    10/30/19 1748 10/31/19 0558 11/01/19 0526 11/02/19 0503  WBC 26.6* 18.0* 21.5* 25.4*  HGB 12.1 9.8* 9.6* 9.8*  HCT 41.1 32.4* 32.0* 32.8*  PLT 496* 356 317 352    COAGS: Recent Labs    10/31/19 1056 11/01/19 0526 11/02/19 0503  INR 1.4* 1.4* 1.3*    BMP: Recent Labs    10/30/19 1748 10/31/19 0558 11/01/19 0526 11/02/19 0503  NA 129* 133* 138 136  K 5.1 4.7 4.1 4.3  CL 98 101 103 105  CO2 18* 22 22 22   GLUCOSE 398* 307* 162* 115*  BUN 26* 36* 44* 36*  CALCIUM 9.1 8.3* 8.1* 8.3*  CREATININE 1.86* 2.21* 1.98* 1.56*  GFRNONAA 30* 25* 28* 38*  GFRAA 35* 29* 33* 44*    LIVER FUNCTION TESTS: Recent Labs     10/30/19 1748 11/01/19 0526 11/02/19 0503  BILITOT 1.2 0.6 0.7  AST 20 23 54*  ALT 39 25 43  ALKPHOS 97 83 178*  PROT 7.5 6.1* 6.4*  ALBUMIN 3.1* 2.2* 2.1*    Assessment and Plan:  54 y/o F s/p abdominal drain placement x 3 for perforated sigmoid diverticulitis on 10/31/19 by Dr. Anselm Pancoast who was seen today for routine drain follow up  - Left sided 12 Fr drain with decreasing output - appears to be serosanguineous on exam, most consistent with flush material.  - Left sided 10 Fr drain with 10 cc purulent output on exam - catheter is difficult to flush/aspirate initially however after 2 attempts significant return of purulent material into suction bulb occurred. - Right sided 12 Fr drain still with significant thin tan output, 80 cc on exam today - flushes/aspirates easily.   Preliminary cultures of left lateral and left medial drains pending - she is currently receiving Zosyn per primary team. WBC trending up to 25.4 today (previously 21.5), afebrile, hgb stable at 9.8.  Continue current drain management - flush drains TID with 5 cc NS, record output Qshift, call IR if difficulty flushing or if there are other drain concerns. Will consider repeat imaging/drain injection if output from left sided drains continues to be <10 cc/24H (not including flush material)  IR will continue to follow. Please call with questions or concerns.    Electronically Signed: Joaquim Nam, PA-C   11/02/2019, 1:01 PM   I spent a total of 15 Minutes at the the patient's bedside AND on the patient's hospital floor or unit, greater than 50% of which was counseling/coordinating care for abdominal abscess drains x 3 follow up.

## 2019-11-02 NOTE — Progress Notes (Addendum)
CC: Acute diverticulitis with abscess formation  Subjective: She still has abdominal tenderness but drainage from all 3 drains has improved.  Drain #3 it looks more of a whitish, dirty watercolor today, not serous.  Drain #1 still looks serosanguineous.  Drain #2 is still purulent. She still in bed and has not been out of bed she is still has a wick in place.  She does not have an incentive spirometer.  She is on oxygen this morning and reports her sats dropped last evening.  She has a history of sleep apnea but does not use her CPAP machine.  We discussed this.  Objective: Vital signs in last 24 hours: Temp:  [97.8 F (36.6 C)-98.4 F (36.9 C)] 97.9 F (36.6 C) (01/12 0540) Pulse Rate:  [88-89] 89 (01/12 0540) Resp:  [17-19] 17 (01/12 0540) BP: (117-121)/(64-66) 121/64 (01/12 0540) SpO2:  [92 %-97 %] 97 % (01/12 0540) Last BM Date: 11/01/19 720 p.o. To 40 IV 1200 urine 25 cc drain #1 20 cc drain #2 90 cc drain #3 Afebrile vital signs are stable CMP shows glucose down to 115, alk phos 178, AST 54, total protein 6.4, WBC 18.0>>21.5>> 25.4(1/12)  Intake/Output from previous day: 01/11 0701 - 01/12 0700 In: 960.6 [P.O.:720; I.V.:8; IV Piggyback:157.6] Out: 1335 [Urine:1200; Drains:135] Intake/Output this shift: No intake/output data recorded.  General appearance: alert, cooperative and no distress GI: Soft, still tender, but she does not have peritonitis.  Drains as noted above. Pulm:  On O2 with decreased sats last PM. Lab Results:  Recent Labs    11/01/19 0526 11/02/19 0503  WBC 21.5* 25.4*  HGB 9.6* 9.8*  HCT 32.0* 32.8*  PLT 317 352    BMET Recent Labs    11/01/19 0526 11/02/19 0503  NA 138 136  K 4.1 4.3  CL 103 105  CO2 22 22  GLUCOSE 162* 115*  BUN 44* 36*  CREATININE 1.98* 1.56*  CALCIUM 8.1* 8.3*   PT/INR Recent Labs    11/01/19 0526 11/02/19 0503  LABPROT 17.2* 16.0*  INR 1.4* 1.3*    Recent Labs  Lab 10/30/19 1748 11/01/19 0526  11/02/19 0503  AST 20 23 54*  ALT 39 25 43  ALKPHOS 97 83 178*  BILITOT 1.2 0.6 0.7  PROT 7.5 6.1* 6.4*  ALBUMIN 3.1* 2.2* 2.1*     Lipase     Component Value Date/Time   LIPASE 23 10/30/2019 1748     Medications: . atorvastatin  40 mg Oral Daily  . cholecalciferol  2,000 Units Oral Daily  . heparin  5,000 Units Subcutaneous Q8H  . insulin aspart  0-20 Units Subcutaneous Q4H  . insulin aspart  10 Units Subcutaneous TID WC  . insulin glargine  60 Units Subcutaneous QHS  . mometasone-formoterol  2 puff Inhalation BID  . montelukast  10 mg Oral QHS  . sertraline  200 mg Oral Daily  . sodium chloride flush  3 mL Intravenous Q12H  . sodium chloride flush  5 mL Intracatheter Q8H  . sodium chloride flush  5 mL Intracatheter Q8H   . piperacillin-tazobactam (ZOSYN)  IV 3.375 g (11/02/19 0520)    Assessment/Plan  C. difficile work-up for diarrhea Type 2 diabetes Hypertension Hx pulmonary embolism -chronic anticoagulation - Eliquis Hx of CVA with vision loss Chronic back pain - oxycodone/[facility] Asthma Sleep apnea BMI 58.93 Acute on chronic kidney disease; creatinine 2.21>>1.98>>1.56  Acute diverticulitis with large abscess IR drain placement times 31/10/21, Dr. Quita Skye  [place]: drain #1 left lower abdominal abscess may involve left ovary and adnexa -25 mL >> 25 ml Drain #2 left lower abdominal abscess medial containing gas and removed 60 mL of brown purulent fluid  >> 20 mL Drained #3 in the right lower abdomen removed 530 mL cloudy yellow fluid>>45m   FEN: Clear liquids/no IV is listed ID: Zosyn 1/9 >>day 4 DVT: SQ heparin Follow-up: TBD Dilaudid 0.5x8, Zofran x2  Plan: I think we need to get her out of bed.  She needs to get up to the bedside commode to void.  That wick should be removed.  She needs to be doing incentive spirometry.  PT evaluation to help begin mobilization.  Continue IV antibiotics.  I am adding p.o. oxycodone for pain  relief.    LOS: 3 days    Kristen [name] 11/02/2019 Please see Amion

## 2019-11-02 NOTE — Evaluation (Signed)
Physical Therapy Evaluation Patient Details Name: Kristen [name] MRN: [id number] DOB: 08/15/1966 Today's Date: 11/02/2019   History of Present Illness  54 yo female admitted to ED on 1/9 with abdominal pain, found to have perforated sigmoid diverticulitis with large abscess. Pt s/p CT guided drainage of abdomino-pelvic fluid, placement of 3 JP drains. PMH includes anxiety, asthma, HTN, DM, PCOS, L optic nerve, CVA, CKD III.  Clinical Impression   Pt presents with generalized weakness, difficulty performing mobility tasks, severe abdominal pain with mobility, unsteadiness in standing, dyspnea on exertion with sats 90% and above on RA, and decreased activity tolerance. Pt to benefit from acute PT to address deficits. Pt ambulated short room distance and tolerated x2 sit to stands this session, and was able to transfer to [facility] with +2 assist. Pt requires min-mod assist +2 for mobility tasks, and required significant pericare assist on BSC. Given pt's mobility deficits and decreased caregiver support, PT recommending SNF level of care post-acutely. Pt in agreement. PT to progress mobility as tolerated, and will continue to follow acutely.      Follow Up Recommendations SNF;Supervision/Assistance - 24 hour    Equipment Recommendations  None recommended by PT    Recommendations for Other Services       Precautions / Restrictions Precautions Precautions: Fall Precaution Comments: 2 L and 1 R JP drains Restrictions Weight Bearing Restrictions: No      Mobility  Bed Mobility Overal bed mobility: Needs Assistance Bed Mobility: Rolling;Sidelying to Sit Rolling: Mod assist Sidelying to sit: Mod assist;HOB elevated;+2 for safety/equipment;+2 for physical assistance       General bed mobility comments: mod assist to roll to R for trunk movement, mod +2 for sidelying to sit for trunk elevation, scooting to EOB. Pt very limited by abdomen pain, increased time to perform.  Transfers Overall  transfer level: Needs assistance Equipment used: 2 person hand held assist Transfers: Sit to/from [facility] Sit to Stand: Min assist;From elevated surface;+2 safety/equipment Stand pivot transfers: Mod assist;+2 safety/equipment       General transfer comment: min assist +2 for power up, steady to come to standing. Mod assist +2 for stand pivot to [facility] for guiding to toilet, steadying.  Ambulation/Gait Ambulation/Gait assistance: Min assist;+2 safety/equipment Gait Distance (Feet): 5 Feet Assistive device: Rolling walker (2 wheeled) Gait Pattern/deviations: Step-to pattern;Step-through pattern;Decreased stride length;Trunk flexed;Wide base of support Gait velocity: decr   General Gait Details: Min assist +2 for steadying, verbal cuing for hand placement on RW when standing and sitting when arrived to recliner.  Stairs            Wheelchair Mobility    Modified Rankin (Stroke Patients Only)       Balance Overall balance assessment: Needs assistance Sitting-balance support: No upper extremity supported;Feet supported Sitting balance-Leahy Scale: Fair     Standing balance support: Bilateral upper extremity supported Standing balance-Leahy Scale: Poor Standing balance comment: +2 HHA vs RW for support in standing                             Pertinent Vitals/Pain Pain Assessment: Faces Faces Pain Scale: Hurts whole lot Pain Location: abdomen, sitting EOB and during mobility Pain Descriptors / Indicators: Sore;Discomfort;Guarding Pain Intervention(s): Monitored during session;Premedicated before session;Repositioned;Limited activity within patient's tolerance    Home Living Family/patient expects to be discharged to:: Private residence Living Arrangements: Other relatives(sister; "she stays at her boyfriend's 3/4's of the time") Available Help at  Discharge: Family;Available PRN/intermittently Type of Home: House Home Access: Stairs to  enter   Entrance Stairs-Number of Steps: 2 Home Layout: One level Home Equipment: Walker - 4 wheels;Walker - 2 wheels;Cane - single point;Shower seat      Prior Function Level of Independence: Needs assistance   Gait / Transfers Assistance Needed: Pt reports not typically using AD for ambulation, but has rollator that she uses if she needs it  ADL's / Homemaking Assistance Needed: Pt reports "not having a good bath in quite a while", states she does everything for self but PT feels she needs assist        Hand Dominance   Dominant Hand: Right    Extremity/Trunk Assessment   Upper Extremity Assessment Upper Extremity Assessment: Generalized weakness    Lower Extremity Assessment Lower Extremity Assessment: Generalized weakness    Cervical / Trunk Assessment Cervical / Trunk Assessment: Normal  Communication   Communication: No difficulties  Cognition Arousal/Alertness: Awake/alert Behavior During Therapy: WFL for tasks assessed/performed Overall Cognitive Status: Within Functional Limits for tasks assessed                                        General Comments General comments (skin integrity, edema, etc.): redness noted along pt sacrum during pericare    Exercises     Assessment/Plan    PT Assessment Patient needs continued PT services  PT Problem List Decreased strength;Decreased mobility;Decreased activity tolerance;Decreased balance;Decreased knowledge of use of DME;Pain;Obesity       PT Treatment Interventions DME instruction;Therapeutic activities;Gait training;Therapeutic exercise;Patient/family education;Balance training;Functional mobility training;Neuromuscular re-education    PT Goals (Current goals can be found in the Care Plan section)  Acute Rehab PT Goals Patient Stated Goal: take care of myself PT Goal Formulation: With patient Time For Goal Achievement: 11/16/19 Potential to Achieve Goals: Good    Frequency Min 2X/week    Barriers to discharge Decreased caregiver support      Co-evaluation               AM-PAC PT "6 Clicks" Mobility  Outcome Measure Help needed turning from your back to your side while in a flat bed without using bedrails?: A Lot Help needed moving from lying on your back to sitting on the side of a flat bed without using bedrails?: A Lot Help needed moving to and from a bed to a chair (including a wheelchair)?: A Lot Help needed standing up from a chair using your arms (e.g., wheelchair or bedside chair)?: A Lot Help needed to walk in hospital room?: A Little Help needed climbing 3-5 steps with a railing? : Total 6 Click Score: 12    End of Session Equipment Utilized During Treatment: Gait belt Activity Tolerance: Patient tolerated treatment well;Patient limited by pain Patient left: in chair;with call bell/phone within reach(pt verbally agrees to press call button and wait for assist prior to mobilizing back to bed) Nurse Communication: Mobility status PT Visit Diagnosis: Other abnormalities of gait and mobility (R26.89);Difficulty in walking, not elsewhere classified (R26.2);Muscle weakness (generalized) (M62.81)    Time: 1125-1150 PT Time Calculation (min) (ACUTE ONLY): 25 min   Charges:   PT Evaluation $PT Eval Low Complexity: 1 Low PT Treatments $Therapeutic Activity: 8-22 mins      Maxie Slovacek E, PT Acute Rehabilitation Services Pager [phone]  Office [phone]   Asheton Scheffler D Elonda Husky 11/02/2019, 1:22 PM

## 2019-11-02 NOTE — Progress Notes (Addendum)
MD paged about need for continuation of cardiac monitoring (order expired), no new orders at this time. Patient is A&O x4, calm, with no complaints at this time. Norlene Duel RN, BSN

## 2019-11-02 NOTE — Progress Notes (Signed)
ANTICOAGULATION CONSULT NOTE - Initial Consult  Pharmacy Consult for Heparin Indication: H/O PE (on Eliquis PTA) - bridge therapy until oral anticoagulation can resume  Allergies  Allergen Reactions  . Cafergot Other (See Comments)    Chest pain; ergotamine-caffiene  . Glucophage [Metformin Hcl] Anaphylaxis  . Canagliflozin Swelling and Other (See Comments)    Legs Swell invokana Legs Swell    Patient Measurements: Height: 5\' 8"  (172.7 cm) Weight: (!) 387 lb 9.1 oz (175.8 kg) IBW/kg (Calculated) : 63.9 Heparin Dosing Weight: 108 kg  Vital Signs: Temp: 98.6 F (37 C) (01/12 1420) Temp Source: Oral (01/12 1420) BP: 142/67 (01/12 1420) Pulse Rate: 84 (01/12 1420)  Labs: Recent Labs    10/31/19 0558 10/31/19 1056 11/01/19 0526 11/02/19 0503  HGB 9.8*  --  9.6* 9.8*  HCT 32.4*  --  32.0* 32.8*  PLT 356  --  317 352  LABPROT  --  17.3* 17.2* 16.0*  INR  --  1.4* 1.4* 1.3*  CREATININE 2.21*  --  1.98* 1.56*    Estimated Creatinine Clearance: 71.6 mL/min (A) (by C-G formula based on SCr of 1.56 mg/dL (H)).   Medical History: Past Medical History:  Diagnosis Date  . Anxiety   . Asthma   . 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 Depression   . Diabetes mellitus   . History of pulmonary embolus (PE)   . Hypertension   . Migraines   . Near syncope   . Obesity   . PCOS (polycystic ovarian syndrome)   . Peripheral edema   . Sleep apnea    uses cpap set on 10  . 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  Patient on Eliquis PTA for h/o PE.  Eliquis held on admission pending potential procedures.  Abscess drains placed 1/10.    Pharmacy consulted to begin IV heparin without bolus for anticoagulation until cleared by general surgery for oral  anticoagulation  Goal of Therapy:  Heparin level 0.3-0.7 units/ml Monitor platelets by anticoagulation protocol: Yes   Plan:   Per MD's instruction, no heparin bolus.  Will begin IV heparin gtt @ 1900 units/hr  Check heparin level 6 hr after heparin started  Check heparin level and CBC daily   Taliesin Hartlage, Toribio Harbour, PharmD 11/02/2019,7:53 PM

## 2019-11-02 NOTE — Progress Notes (Addendum)
PROGRESS NOTE    Kristen [name]  QJF:[id number]  DOB: 07-Oct-1966  PCP: Lawerance Cruel, MD Admit date:10/30/2019  54 year old Caucasian female with PMH of morbid obesity, type 2 diabetes mellitus, chronic pain on opioids, hypertension, depression, anxiety, asthma, s/p cholecystectomy, CVA with residual vision loss, PCOS, sleep apnea who was brought to the ED by EMS from home for worsening abdominal pain associated with diarrhea. Recently treated for UTI with no improvement in abd pain. Lives at home and largely immobile. Reported chronic depression and sometimes has suicidal ideations but none now. ED Course: Afebrile,noted to be hypertensive, tachycardic.Found to have leukocytosis of 26K and blood sugar elevated in the 300s. CT abdomen pelvis without contrast (no contrast due to renal dysfunction)showed perforated sigmoid diverticulitis with a large 12 cm abscess in addition to complex fluid collections extending into the patient's pelvis. ED provider spoke to surgeon on call Dr. Gala Lewandowsky who indicated no surgical need at this time and recommended IR consult for drainage of the abscess. Hospital course: Patient admitted to [facility] for IV abx and IR consulted. Patient underwent CT guided drain x3  placement on 1/10.  Subjective:  Patient resting comfortably--feels better today.  Sister at bedside.  Drain output slightly improved.  Noted to be on 1 L O2. Objective: Vitals:   11/01/19 2127 11/02/19 0540 11/02/19 0756 11/02/19 1420  BP:  121/64  (!) 142/67  Pulse:  89  84  Resp:  17  20  Temp:  97.9 F (36.6 C)  98.6 F (37 C)  TempSrc:  Oral  Oral  SpO2: 92% 97% 97% 96%  Weight:      Height:        Intake/Output Summary (Last 24 hours) at 11/02/2019 1938 Last data filed at 11/02/2019 1800 Gross per 24 hour  Intake 1812.88 ml  Output 2189 ml  Net -376.12 ml   Filed Weights   10/31/19 0200  Weight: (!) 175.8 kg    Physical Examination:  General exam: Appears in mild discomfort  due to abdominal drains in place Respiratory system: Clear to auscultation. Respiratory effort normal. Cardiovascular system: S1 & S2 heard, RRR. No JVD, murmurs, rubs, gallops or clicks. No pedal edema. Gastrointestinal system: Abdomen is obese, nondistended, soft. Mild tenderness in lower abdominal area. Has 2 left sided and 1 right sided drain in place. Normal bowel sounds heard. Central nervous system: Alert and oriented. No new focal neurological deficits. Extremities: No contractures, edema or joint deformities.  Skin: No rashes, lesions or ulcers Psychiatry: Judgement and insight appear normal. Mood & affect appropriate.   Data Reviewed: I have personally reviewed following labs and imaging studies  CBC: Recent Labs  Lab 10/30/19 1748 10/31/19 0558 11/01/19 0526 11/02/19 0503  WBC 26.6* 18.0* 21.5* 25.4*  HGB 12.1 9.8* 9.6* 9.8*  HCT 41.1 32.4* 32.0* 32.8*  MCV 87.6 87.1 88.2 86.8  PLT 496* 356 317 893   Basic Metabolic Panel: Recent Labs  Lab 10/30/19 1748 10/31/19 0558 11/01/19 0526 11/02/19 0503  NA 129* 133* 138 136  K 5.1 4.7 4.1 4.3  CL 98 101 103 105  CO2 18* 22 22 22   GLUCOSE 398* 307* 162* 115*  BUN 26* 36* 44* 36*  CREATININE 1.86* 2.21* 1.98* 1.56*  CALCIUM 9.1 8.3* 8.1* 8.3*  MG  --   --  1.7 1.8  PHOS  --   --  4.2 4.4   GFR: Estimated Creatinine Clearance: 71.6 mL/min (A) (by C-G formula based on SCr of 1.56 mg/dL (H)).  Liver Function Tests: Recent Labs  Lab 10/30/19 1748 11/01/19 0526 11/02/19 0503  AST 20 23 54*  ALT 39 25 43  ALKPHOS 97 83 178*  BILITOT 1.2 0.6 0.7  PROT 7.5 6.1* 6.4*  ALBUMIN 3.1* 2.2* 2.1*   Recent Labs  Lab 10/30/19 1748  LIPASE 23   No results for input(s): AMMONIA in the last 168 hours. Coagulation Profile: Recent Labs  Lab 10/31/19 1056 11/01/19 0526 11/02/19 0503  INR 1.4* 1.4* 1.3*   Cardiac Enzymes: No results for input(s): CKTOTAL, CKMB, CKMBINDEX, TROPONINI in the last 168 hours. BNP (last 3  results) No results for input(s): PROBNP in the last 8760 hours. HbA1C: Recent Labs    10/30/19 2144  HGBA1C 8.5*   CBG: Recent Labs  Lab 11/01/19 2345 11/02/19 0321 11/02/19 0742 11/02/19 1204 11/02/19 1617  GLUCAP 99 110* 99 121* 118*   Lipid Profile: No results for input(s): CHOL, HDL, LDLCALC, TRIG, CHOLHDL, LDLDIRECT in the last 72 hours. Thyroid Function Tests: No results for input(s): TSH, T4TOTAL, FREET4, T3FREE, THYROIDAB in the last 72 hours. Anemia Panel: No results for input(s): VITAMINB12, FOLATE, FERRITIN, TIBC, IRON, RETICCTPCT in the last 72 hours. Sepsis Labs: Recent Labs  Lab 10/30/19 1916 10/31/19 0558  LATICACIDVEN 1.3 1.4    Recent Results (from the past 240 hour(s))  Respiratory Panel by RT PCR (Flu A&B, Covid) - Nasopharyngeal Swab     Status: None   Collection Time: 10/30/19  7:25 PM   Specimen: Nasopharyngeal Swab  Result Value Ref Range Status   SARS Coronavirus 2 by RT PCR NEGATIVE NEGATIVE Final    Comment: (NOTE) SARS-CoV-2 target nucleic acids are NOT DETECTED. The SARS-CoV-2 RNA is generally detectable in upper respiratoy specimens during the acute phase of infection. The lowest concentration of SARS-CoV-2 viral copies this assay can detect is 131 copies/mL. A negative result does not preclude SARS-Cov-2 infection and should not be used as the sole basis for treatment or other patient management decisions. A negative result may occur with  improper specimen collection/handling, submission of specimen other than nasopharyngeal swab, presence of viral mutation(s) within the areas targeted by this assay, and inadequate number of viral copies (<131 copies/mL). A negative result must be combined with clinical observations, patient history, and epidemiological information. The expected result is Negative. Fact Sheet for Patients:  PinkCheek.be Fact Sheet for Healthcare Providers:    GravelBags.it This test is not yet ap proved or cleared by the [place] FDA and  has been authorized for detection and/or diagnosis of SARS-CoV-2 by FDA under an Emergency Use Authorization (EUA). This EUA will remain  in effect (meaning this test can be used) for the duration of the COVID-19 declaration under Section 564(b)(1) of the Act, 21 U.S.C. section 360bbb-3(b)(1), unless the authorization is terminated or revoked sooner.    Influenza A by PCR NEGATIVE NEGATIVE Final   Influenza B by PCR NEGATIVE NEGATIVE Final    Comment: (NOTE) The Xpert Xpress SARS-CoV-2/FLU/RSV assay is intended as an aid in  the diagnosis of influenza from Nasopharyngeal swab specimens and  should not be used as a sole basis for treatment. Nasal washings and  aspirates are unacceptable for Xpert Xpress SARS-CoV-2/FLU/RSV  testing. Fact Sheet for Patients: PinkCheek.be Fact Sheet for Healthcare Providers: GravelBags.it This test is not yet approved or cleared by the [place] FDA and  has been authorized for detection and/or diagnosis of SARS-CoV-2 by  FDA under an Emergency Use Authorization (EUA). This EUA will remain  in effect (meaning this test can be used) for the duration of the  Covid-19 declaration under Section 564(b)(1) of the Act, 21  U.S.C. section 360bbb-3(b)(1), unless the authorization is  terminated or revoked. Performed at [facility], [place] [address] [place], [place] [address]   Culture, blood (routine x 2)     Status: None (Preliminary result)   Collection Time: 10/30/19  8:13 PM   Specimen: BLOOD  Result Value Ref Range Status   Specimen Description   Final    BLOOD LEFT HAND Performed at [place] [address] [place], [place] [address]    Special Requests   Final    BOTTLES DRAWN AEROBIC AND ANAEROBIC Blood Culture adequate  volume Performed at [place] [address] [place], [place] [address]    Culture   Final    NO GROWTH 2 DAYS Performed at [place] [address] [place], [place] [address]    Report Status PENDING  Incomplete  Culture, blood (routine x 2)     Status: None (Preliminary result)   Collection Time: 10/30/19 11:00 PM   Specimen: BLOOD RIGHT HAND  Result Value Ref Range Status   Specimen Description   Final    BLOOD RIGHT HAND Performed at [place] [address], [place], [place] [address]    Special Requests   Final    BOTTLES DRAWN AEROBIC AND ANAEROBIC Blood Culture adequate volume Performed at [place] [address] [place], [place] [address]    Culture   Final    NO GROWTH 2 DAYS Performed at [place] [address], [place], [place] [address]    Report Status PENDING  Incomplete  C difficile quick scan w PCR reflex     Status: None   Collection Time: 10/31/19  8:16 AM   Specimen: STOOL  Result Value Ref Range Status   C Diff antigen NEGATIVE NEGATIVE Final   C Diff toxin NEGATIVE NEGATIVE Final   C Diff interpretation No C. difficile detected.  Final    Comment: Performed at [facility], [place] [address], [place], [place] [address]  Aerobic/Anaerobic Culture (surgical/deep wound)     Status: None (Preliminary result)   Collection Time: 10/31/19  4:11 PM   Specimen: Abscess  Result Value Ref Range Status   Specimen Description   Final    ABSCESS Performed at [place] [address], [place], [place] [address]    Special Requests   Final    1 LEFT ABDOMINAL LATERAL Performed at [name], [place] [address], [place], [place] [address]    Gram Stain   Final    ABUNDANT WBC PRESENT, PREDOMINANTLY PMN ABUNDANT GRAM POSITIVE COCCI IN PAIRS IN CLUSTERS IN CHAINS FEW GRAM NEGATIVE COCCOBACILLI RARE GRAM POSITIVE  RODS    Culture   Final    MODERATE ESCHERICHIA COLI HOLDING FOR POSSIBLE ANAEROBE Performed at [facility], [place] [address] [place],  [address]    Report Status PENDING  Incomplete   Organism ID, Bacteria ESCHERICHIA COLI  Final      Susceptibility   Escherichia coli - MIC*    AMPICILLIN >=32 RESISTANT Resistant     CEFAZOLIN <=4 SENSITIVE Sensitive     CEFEPIME <=0.12 SENSITIVE Sensitive     CEFTAZIDIME <=1 SENSITIVE Sensitive     CEFTRIAXONE <=0.25 SENSITIVE Sensitive     CIPROFLOXACIN >=  4 RESISTANT Resistant     GENTAMICIN >=16 RESISTANT Resistant     IMIPENEM <=0.25 SENSITIVE Sensitive     TRIMETH/SULFA >=320 RESISTANT Resistant     AMPICILLIN/SULBACTAM >=32 RESISTANT Resistant     PIP/TAZO <=4 SENSITIVE Sensitive     * MODERATE ESCHERICHIA COLI  Aerobic/Anaerobic Culture (surgical/deep wound)     Status: None (Preliminary result)   Collection Time: 10/31/19  4:19 PM   Specimen: Abscess  Result Value Ref Range Status   Specimen Description   Final    ABSCESS Performed at [place] [address] [place], [place] [address]    Special Requests   Final    2 LEFT ABDOMEN MEDIAL Performed at [facility], [place] [address], [place], [place] [address]    Gram Stain   Final    FEW WBC PRESENT,BOTH PMN AND MONONUCLEAR ABUNDANT GRAM POSITIVE COCCI IN PAIRS IN CHAINS RARE GRAM POSITIVE RODS RARE GRAM NEGATIVE RODS    Culture   Final    CULTURE REINCUBATED FOR BETTER GROWTH Performed at [facility], [place] [address], [place], [place] [address]    Report Status PENDING  Incomplete      Radiology Studies: No results found.      Scheduled Meds: . atorvastatin  40 mg Oral Daily  . cholecalciferol  2,000 Units Oral Daily  . insulin aspart  0-20 Units Subcutaneous Q4H  . insulin aspart  10 Units Subcutaneous TID WC  . insulin glargine  60 Units Subcutaneous QHS  . mometasone-formoterol  2 puff Inhalation BID   . montelukast  10 mg Oral QHS  . sertraline  200 mg Oral Daily  . sodium chloride flush  3 mL Intravenous Q12H  . sodium chloride flush  5 mL Intracatheter Q8H  . sodium chloride flush  5 mL Intracatheter Q8H   Continuous Infusions: . piperacillin-tazobactam (ZOSYN)  IV 3.375 g (11/02/19 1338)    Assessment & Plan:   Perforated sigmoid diverticulitis complicated with a large abscess: CT abdomen pelvis without contrast showed perforated sigmoid diverticulitis with large 12 cm abscess and complex fluid collections into the pelvis.C. difficile toxinNegative.Continue Zosyn and Dilaudid prn for pain.Blood cultures from 1/9 -ve so far.Seen by GS, appreciate recs.VIR consulted to for abscess drainage--Drains placed on 1/10, apprec recs Drain #1 in left lower abdominal abscess (lateral) and may involve left ovary and adnexa. 25 ml of yellow purulent fluid removed. Drain #2 in left lower abdominal abscess (medial) that contains gas and removed 60 ml of brown purulent fluid. Drain #3 in right lower abdomen removed 530 ml of cloudy yellow fluid. IR sent fluid from Drain #1 and #2 for culture. Will  resume anticoagulation with IV heparin as okay per IR/general surgery.  Plan to transition back to Eliquis if and when no more procedures necessary--will d/w IR.   AKI on CKD stage IIIa :Baseline creatinine~1.1. Creatinine elevated to 1.86 on admission, peaked to 2.2 and now downtrending -1.56 today.Likely prerenal/renal in setting of poor p.o. intake/infection-Renally dose all medications. Avoid nephrotoxic drugs.- Hold ACEI, treat underlying infection, off IV fluids--encourage oral intake.    Mild acute hypoxic respiratory failure: Given O2 requirement today, hold IV fluids and obtain chest x-ray to rule out fluid overload.  Diabetes Mellitus with Hyperglycemia: in the setting of acute infection. On insulin NovoLog 44 units 3 times daily with meals, insulin Lantus 80 units at bedtime at  home. Currently on Lantus 60 units at bedtime/SSI. Resume mealtime insulin when diet  advanced  Metabolic acidosis-MostlyNonanion gap. Bicarbonate 18.Likely in setting of diarrhea/infection  Pseudo-hyponatremia ; resolved with BG correction.  History of pulmonary embolism-On Eliquis at home. Held in anticipation of procedure.D/W GS--will resume anticoagulation with  heparin drip --pharmacy consult placed.  Patient aware of the plan  Hypertension-On lisinopril at home. Held in setting of AKI.-Monitor for now. Pain control.  Chronic pain-On oxycodone at home. Currently-Dilaudid as needed for pain control--change back to PO soon  Asthma: Stable with no new oxygen requirement.-Continue home inhalers  Morbid obesity: Counseled regarding weight loss, f/u PCP  Pyuria with rare bacteruria: recently treated for UTI. Now on abx for #1.  Depression: Denies suicidal/homicidal throughts. Resumed Zoloft as now on CLD.   DVT prophylaxis:Heparin SQ(on Eliquis for PE which is on hold)  Code Status:DNR(discussed with patient during this hospitalization, patient states she is not happy with her quality of life at baseline and would like to be DO NOT RESUSCITATE at this time) Family / Patient Communication: d/w patient and sister at bedside. D/W GS, bedside nurse Disposition Plan: Home versus AIR in 4 to 5 days     LOS: 3 days    Time spent: 32 minutes    Guilford Shi, MD [facility] Pager [phone]  If 7PM-7AM, please contact night-coverage [email or web address] Password [facility] 11/02/2019, 7:38 PM

## 2019-11-03 LAB — GLUCOSE, CAPILLARY
Glucose-Capillary: 113 mg/dL — ABNORMAL HIGH (ref 70–99)
Glucose-Capillary: 157 mg/dL — ABNORMAL HIGH (ref 70–99)
Glucose-Capillary: 177 mg/dL — ABNORMAL HIGH (ref 70–99)
Glucose-Capillary: 182 mg/dL — ABNORMAL HIGH (ref 70–99)
Glucose-Capillary: 206 mg/dL — ABNORMAL HIGH (ref 70–99)
Glucose-Capillary: 89 mg/dL (ref 70–99)
Glucose-Capillary: 91 mg/dL (ref 70–99)
Glucose-Capillary: 99 mg/dL (ref 70–99)

## 2019-11-03 LAB — COMPREHENSIVE METABOLIC PANEL
ALT: 33 U/L (ref 0–44)
AST: 31 U/L (ref 15–41)
Albumin: 1.9 g/dL — ABNORMAL LOW (ref 3.5–5.0)
Alkaline Phosphatase: 201 U/L — ABNORMAL HIGH (ref 38–126)
Anion gap: 8 (ref 5–15)
BUN: 28 mg/dL — ABNORMAL HIGH (ref 6–20)
CO2: 22 mmol/L (ref 22–32)
Calcium: 8.2 mg/dL — ABNORMAL LOW (ref 8.9–10.3)
Chloride: 105 mmol/L (ref 98–111)
Creatinine, Ser: 1.28 mg/dL — ABNORMAL HIGH (ref 0.44–1.00)
GFR calc Af Amer: 55 mL/min — ABNORMAL LOW (ref >60)
GFR calc non Af Amer: 48 mL/min — ABNORMAL LOW (ref >60)
Glucose, Bld: 94 mg/dL (ref 70–99)
Potassium: 4.3 mmol/L (ref 3.5–5.1)
Sodium: 135 mmol/L (ref 135–145)
Total Bilirubin: 0.3 mg/dL (ref 0.3–1.2)
Total Protein: 6.1 g/dL — ABNORMAL LOW (ref 6.5–8.1)

## 2019-11-03 LAB — PROTIME-INR
INR: 1.3 — ABNORMAL HIGH (ref 0.8–1.2)
Prothrombin Time: 15.6 seconds — ABNORMAL HIGH (ref 11.4–15.2)

## 2019-11-03 LAB — CBC
HCT: 31.4 % — ABNORMAL LOW (ref 36.0–46.0)
Hemoglobin: 9.3 g/dL — ABNORMAL LOW (ref 12.0–15.0)
MCH: 26.1 pg (ref 26.0–34.0)
MCHC: 29.6 g/dL — ABNORMAL LOW (ref 30.0–36.0)
MCV: 88 fL (ref 80.0–100.0)
Platelets: 309 10*3/uL (ref 150–400)
RBC: 3.57 MIL/uL — ABNORMAL LOW (ref 3.87–5.11)
RDW: 15 % (ref 11.5–15.5)
WBC: 20.8 10*3/uL — ABNORMAL HIGH (ref 4.0–10.5)
nRBC: 0 % (ref 0.0–0.2)

## 2019-11-03 LAB — HEPARIN LEVEL (UNFRACTIONATED)
Heparin Unfractionated: 0.22 IU/mL — ABNORMAL LOW (ref 0.30–0.70)
Heparin Unfractionated: 0.24 IU/mL — ABNORMAL LOW (ref 0.30–0.70)
Heparin Unfractionated: 0.2 IU/mL — ABNORMAL LOW (ref 0.30–0.70)

## 2019-11-03 LAB — MAGNESIUM: Magnesium: 1.9 mg/dL (ref 1.7–2.4)

## 2019-11-03 LAB — PHOSPHORUS: Phosphorus: 4.2 mg/dL (ref 2.5–4.6)

## 2019-11-03 MED ORDER — PRO-STAT SUGAR FREE PO LIQD
30.0000 mL | Freq: Two times a day (BID) | ORAL | Status: DC
  Administered 2019-11-03 – 2019-11-12 (×13): 30 mL via ORAL
  Filled 2019-11-03 (×12): qty 30

## 2019-11-03 MED ORDER — HEPARIN (PORCINE) 25000 UT/250ML-% IV SOLN
2850.0000 [IU]/h | INTRAVENOUS | Status: DC
  Administered 2019-11-03 (×2): 2400 [IU]/h via INTRAVENOUS
  Filled 2019-11-03 (×4): qty 250

## 2019-11-03 MED ORDER — ENSURE MAX PROTEIN PO LIQD
11.0000 [oz_av] | Freq: Two times a day (BID) | ORAL | Status: DC
  Administered 2019-11-03 – 2019-11-12 (×14): 11 [oz_av] via ORAL
  Filled 2019-11-03 (×19): qty 330

## 2019-11-03 MED ORDER — SACCHAROMYCES BOULARDII 250 MG PO CAPS
250.0000 mg | ORAL_CAPSULE | Freq: Two times a day (BID) | ORAL | Status: DC
  Administered 2019-11-03 – 2019-11-12 (×18): 250 mg via ORAL
  Filled 2019-11-03 (×18): qty 1

## 2019-11-03 MED ORDER — HEPARIN (PORCINE) 25000 UT/250ML-% IV SOLN
2200.0000 [IU]/h | INTRAVENOUS | Status: DC
  Administered 2019-11-03: 2200 [IU]/h via INTRAVENOUS
  Filled 2019-11-03 (×2): qty 250

## 2019-11-03 NOTE — Care Management Important Message (Signed)
Important Message  Patient Details IM Letter given to [place] Case Manager to present to the [place] Name: Kristen [name] MRN: [id number] Date of Birth: 05/07/1966   Medicare Important Message Given:  Yes     Kerin Salen 11/03/2019, 12:00 PM

## 2019-11-03 NOTE — NC FL2 (Signed)
[place] MEDICAID FL2 LEVEL OF CARE SCREENING TOOL     IDENTIFICATION  Patient Name: Kristen [name] Birthdate: 08/16/1966 Sex: female Admission Date (Current Location): 10/30/2019  [facility] [place] Number:  Herbalist and Address:  [facility],  [place], [place]      Provider Number: [id number]  Attending Physician Name and Address:  Georgette Shell, MD  Relative Name and Phone Number:       Current Level of Care: Hospital Recommended Level of Care: [place] Prior Approval Number:    Date Approved/Denied:   PASRR Number: Pending  Discharge Plan: SNF    Current Diagnoses: Patient Active Problem List   Diagnosis Date Noted  . Diverticulitis of large intestine with abscess 10/31/2019  . Diverticulitis 10/30/2019  . Abscess of sigmoid colon 10/30/2019  . Diabetic nephropathy ([place]) 10/30/2019  . 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 LBP (low back pain) 10/30/2019  . Degeneration of intervertebral disc of lumbosacral region 10/30/2019  . Atypical migraine 10/30/2019  . 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 History of pulmonary embolus (PE) 10/30/2019  . AKI (acute kidney injury) ([place]) 10/30/2019  . Hyponatremia 10/30/2019  . Hyperglycemia 10/30/2019  . Hyperkalemia 04/27/2019  . Poor vision 04/27/2019  . Left knee pain 01/06/2019  . CKD (chronic kidney disease), stage III 04/20/2018  . Right shoulder tendonitis 03/30/2018  . Myofascial muscle pain 05/21/2017  . Pulmonary embolism ([place]) 03/21/2017  . Pulmonary emboli ([place]) 03/21/2017  . Cellulitis 12/22/2016  . Cellulitis of left lower extremity   . Reactive depression 11/27/2016  . Spondylosis of lumbar region without myelopathy or radiculopathy 07/01/2016  . Lumbar facet arthropathy 07/01/2016  . Dizziness 06/15/2015  . Obesity, Class III, BMI 40-49.9 (morbid obesity) ([place])  06/15/2015  . Type 2 diabetes mellitus with stage 3 chronic kidney disease ([place]) 06/15/2015  . Hyperlipidemia 06/15/2015  . Chronic pain syndrome 06/15/2015  . Amaurosis fugax 11/03/2012  . Orthostatic hypotension 10/30/2012  . Headache(784.0) 10/30/2012  . 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 Hypercholesteremia 10/01/2012  . Diabetes 1.5, managed as type 1 ([place]) 06/18/2012  . Essential hypertension 06/18/2012  . Asthma 06/18/2012  . Morbid obesity with BMI of 60.0-69.9, adult ([place]) 06/18/2012    Orientation RESPIRATION BLADDER Height & Weight     Self, Time, Situation, Place  O2(1 L nasal cannula) External catheter Weight: (!) 175.8 kg Height:  5\' 8"  (172.7 cm)  BEHAVIORAL SYMPTOMS/MOOD NEUROLOGICAL BOWEL NUTRITION STATUS  (none) (none) Incontinent Diet(see d/c report)  AMBULATORY STATUS COMMUNICATION OF NEEDS Skin   Extensive Assist Verbally Other (Comment)(3 abdominal drains)                       Personal Care Assistance Level of Assistance  Bathing, Feeding, Dressing Bathing Assistance: Maximum assistance Feeding assistance: Independent Dressing Assistance: Limited assistance     Functional Limitations Info  Sight, Hearing, Speech Sight Info: Adequate(Blind in left eye, compensation fully with R eye) Hearing Info: Adequate Speech Info: Adequate    SPECIAL CARE FACTORS FREQUENCY  PT (By licensed PT)     PT Frequency: 5X/W              Contractures Contractures Info: Not present    Additional Factors Info  Code Status, Allergies Code Status Info: full Allergies Info: Cafergot, Metformin, Canagliflozin           Current Medications (  11/03/2019):  This is the current hospital active medication list Current Facility-Administered Medications  Medication Dose Route Frequency Provider Last Rate Last Admin  . acetaminophen (TYLENOL) tablet 650 mg  650 mg Oral Q6H PRN Lucky Cowboy, MD   650 mg at 11/03/19 0936  . albuterol (PROVENTIL) (2.5 MG/3ML)  0.083% nebulizer solution 3 mL  3 mL Nebulization TID PRN Lucky Cowboy, MD      . atorvastatin (LIPITOR) tablet 40 mg  40 mg Oral Daily Lucky Cowboy, MD   40 mg at 11/03/19 0936  . cholecalciferol (VITAMIN D) tablet 2,000 Units  2,000 Units Oral Daily Lucky Cowboy, MD   2,000 Units at 11/03/19 [phone]  . heparin ADULT infusion 100 units/mL (25000 units/275mL sodium chloride 0.45%)  2,200 Units/hr Intravenous Continuous Adrian Saran, RPH 22 mL/hr at 11/03/19 0935 2,200 Units/hr at 11/03/19 0935  . HYDROmorphone (DILAUDID) injection 0.5 mg  0.5 mg Intravenous Q2H PRN Arma Heading, MD   0.5 mg at 11/02/19 1735  . insulin aspart (novoLOG) injection 0-20 Units  0-20 Units Subcutaneous Q4H Lucky Cowboy, MD   3 Units at 11/01/19 1759  . insulin aspart (novoLOG) injection 10 Units  10 Units Subcutaneous TID WC Arma Heading, MD   10 Units at 11/02/19 1735  . insulin glargine (LANTUS) injection 60 Units  60 Units Subcutaneous QHS Lucky Cowboy, MD   60 Units at 11/02/19 2152  . mometasone-formoterol (DULERA) 200-5 MCG/ACT inhaler 2 puff  2 puff Inhalation BID Lucky Cowboy, MD   2 puff at 11/03/19 0813  . montelukast (SINGULAIR) tablet 10 mg  10 mg Oral QHS Lucky Cowboy, MD   10 mg at 11/02/19 2151  . ondansetron (ZOFRAN) injection 4 mg  4 mg Intravenous Q6H PRN Arma Heading, MD   4 mg at 11/03/19 0837  . oxyCODONE (Oxy IR/ROXICODONE) immediate release tablet 5-10 mg  5-10 mg Oral Q4H PRN Earnstine Regal, PA-C   10 mg at 11/03/19 5456  . piperacillin-tazobactam (ZOSYN) IVPB 3.375 g  3.375 g Intravenous Q8H Lucky Cowboy, MD 12.5 mL/hr at 11/03/19 0643 3.375 g at 11/03/19 0643  . saccharomyces boulardii (FLORASTOR) capsule 250 mg  250 mg Oral BID Earnstine Regal, PA-C   250 mg at 11/03/19 2563  . sertraline (ZOLOFT) tablet 200 mg  200 mg Oral Daily Lucky Cowboy, MD   200 mg at 11/03/19 0936  . sodium chloride flush (NS) 0.9 % injection 3 mL  3 mL Intravenous Q12H Lucky Cowboy, MD    3 mL at 11/03/19 0937  . sodium chloride flush (NS) 0.9 % injection 5 mL  5 mL Intracatheter Q8H Markus Daft, MD   5 mL at 11/03/19 0801  . sodium chloride flush (NS) 0.9 % injection 5 mL  5 mL Intracatheter Q8H Markus Daft, MD   5 mL at 11/03/19 0801     Discharge Medications: Please see discharge summary for a list of discharge medications.  Relevant Imaging Results:  Relevant Lab Results:   Additional Information [place], [place]

## 2019-11-03 NOTE — Progress Notes (Signed)
CC: Acute diverticulitis with abscess formation.  Subjective: She is feeling much better this a.m.  Her pain on exam is much less.  Drainage as listed below.  The drains have been emptied so I really cannot see much of the fluid this AM.  She was up some yesterday, and this noticed the improvement when she is up and moving.  She has had multiple loose stools, she also notes she has some problems with chronic loose stools.  Objective: Vital signs in last 24 hours: Temp:  [97.9 F (36.6 C)-98.7 F (37.1 C)] 97.9 F (36.6 C) (01/13 0406) Pulse Rate:  [80-84] 80 (01/13 0406) Resp:  [20-22] 20 (01/13 0406) BP: (115-142)/(65-93) 128/65 (01/13 0406) SpO2:  [96 %-99 %] 96 % (01/13 0406) Last BM Date: 11/02/19 1080 p.o. 300 IV 1200 urine Drain 129 cc Drain to 20 cc Drain 330 cc BM x3 Afebrile vital signs are stable. Sats are 96% on nasal cannula at 1 L. Creatinine is improving 1.28 today. Glucose control much improved.  94 this AM. WBC improving 20.8 this AM.  Intake/Output from previous day: 01/12 0701 - 01/13 0700 In: 1418.6 [P.O.:1080; I.V.:136.2; IV Piggyback:142.4] Out: 1379 [Urine:1200; Drains:179] Intake/Output this shift: No intake/output data recorded.  General appearance: alert, cooperative and no distress Resp: clear to auscultation bilaterally GI: Soft, much less tender on exam today.  Drainage as listed above.  Lab Results:  Recent Labs    11/02/19 0503 11/03/19 0618  WBC 25.4* 20.8*  HGB 9.8* 9.3*  HCT 32.8* 31.4*  PLT 352 309    BMET Recent Labs    11/02/19 0503 11/03/19 0618  NA 136 135  K 4.3 4.3  CL 105 105  CO2 22 22  GLUCOSE 115* 94  BUN 36* 28*  CREATININE 1.56* 1.28*  CALCIUM 8.3* 8.2*   PT/INR Recent Labs    11/02/19 0503 11/03/19 0618  LABPROT 16.0* 15.6*  INR 1.3* 1.3*    Recent Labs  Lab 10/30/19 1748 11/01/19 0526 11/02/19 0503 11/03/19 0618  AST 20 23 54* 31  ALT 39 25 43 33  ALKPHOS 97 83 178* 201*  BILITOT  1.2 0.6 0.7 0.3  PROT 7.5 6.1* 6.4* 6.1*  ALBUMIN 3.1* 2.2* 2.1* 1.9*     Lipase     Component Value Date/Time   LIPASE 23 10/30/2019 1748     Medications: . atorvastatin  40 mg Oral Daily  . cholecalciferol  2,000 Units Oral Daily  . insulin aspart  0-20 Units Subcutaneous Q4H  . insulin aspart  10 Units Subcutaneous TID WC  . insulin glargine  60 Units Subcutaneous QHS  . mometasone-formoterol  2 puff Inhalation BID  . montelukast  10 mg Oral QHS  . sertraline  200 mg Oral Daily  . sodium chloride flush  3 mL Intravenous Q12H  . sodium chloride flush  5 mL Intracatheter Q8H  . sodium chloride flush  5 mL Intracatheter Q8H    Assessment/Plan C. difficile work-up for diarrhea Type 2 diabetes Hypertension Hx pulmonary embolism-chronic anticoagulation- Eliquis Hx of CVA with vision loss Chronic back pain - oxycodone/[facility] Asthma Sleep apnea BMI 58.93 Acute on chronic kidney disease; creatinine 2.21>>1.98>>1.56>>1.28  Acute diverticulitis with large abscess IR drain placement times 31/10/21, Dr. Markus Daft: drain #1 left lower abdominal abscess may involve left ovary and adnexa-25 mL >> 25 ml>>29 Drain #2 left lower abdominal abscess medial containing gas and removed 60 mL of brown purulent fluid  >> 20  mL>>20 Drained #3 in the right lower abdomen removed 530 mL cloudy yellow fluid>>87mL>>130 WBC  21.5>>25.4>>20.8  FEN: Clear liquids/no IV is listed ID: Zosyn 1/9>>day 4 DVT: SQ heparin Follow-up:TBD Dilaudid 0.5x6, oxycodone 10 mg x 2 Zofran x3  Plan: Full liquids, continue to mobilize, she was seen by PT and they are recommending SNF with supervision/assistance 24 hours.  Continue IV antibiotics.  She has daily CBCs ordered.      LOS: 4 days    Jerel Sardina 11/03/2019 Please see Amion

## 2019-11-03 NOTE — Progress Notes (Signed)
ANTICOAGULATION CONSULT NOTE - follow up  Pharmacy Consult for Heparin Indication: H/O PE (on Eliquis PTA) - bridge therapy until oral anticoagulation can resume  Allergies  Allergen Reactions  . Cafergot Other (See Comments)    Chest pain; ergotamine-caffiene  . Glucophage [Metformin Hcl] Anaphylaxis  . Canagliflozin Swelling and Other (See Comments)    Legs Swell invokana Legs Swell    Patient Measurements: Height: 5\' 8"  (172.7 cm) Weight: (!) 387 lb 9.1 oz (175.8 kg) IBW/kg (Calculated) : 63.9 Heparin Dosing Weight: 108 kg  Vital Signs: Temp: 97.9 F (36.6 C) (01/13 0406) Temp Source: Oral (01/13 0406) BP: 128/65 (01/13 0406) Pulse Rate: 80 (01/13 0406)  Labs: Recent Labs    10/31/19 1056 11/01/19 0526 11/02/19 0503 11/03/19 0618  HGB  --  9.6* 9.8* 9.3*  HCT  --  32.0* 32.8* 31.4*  PLT  --  317 352 309  LABPROT 17.3* 17.2* 16.0*  --   INR 1.4* 1.4* 1.3*  --   HEPARINUNFRC  --   --   --  0.22*  CREATININE  --  1.98* 1.56*  --     Estimated Creatinine Clearance: 71.6 mL/min (A) (by C-G formula based on SCr of 1.56 mg/dL (H)).   Medical History: Past Medical History:  Diagnosis Date  . Anxiety   . Asthma   . 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 Depression   . Diabetes mellitus   . History of pulmonary embolus (PE)   . Hypertension   . Migraines   . Near syncope   . Obesity   . PCOS (polycystic ovarian syndrome)   . Peripheral edema   . Sleep apnea    uses cpap set on 10  . 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  Patient on Eliquis PTA for h/o PE.  Eliquis held on admission pending potential procedures.  Abscess drains placed 1/10.    Pharmacy consulted to begin IV heparin without bolus for anticoagulation until  cleared by general surgery for oral anticoagulation  Today, 11/03/19  1st heparin level SUBtherapeutic on current IV heparin rate of 1900 units/hr  CBC stable  No reported bleeding or problems noted per RN  Goal of Therapy:  Heparin level 0.3-0.7 units/ml Monitor platelets by anticoagulation protocol: Yes   Plan:   Per MD's instruction, no heparin bolus.   Increase IV heparin from 1900 units/hr to 2200 units/hr  Recheck heparin level 6 hr after heparin increased  Check heparin level and CBC daily   Kara Mead, PharmD 11/03/2019,6:38 AM

## 2019-11-03 NOTE — Progress Notes (Signed)
PROGRESS NOTE    Kristen [name]  QVZ:[id number] DOB: 27-Sep-1966 DOA: 10/30/2019 PCP: Lawerance Cruel, MD    Brief Narrative:54 year old Caucasian female with PMH of morbid obesity, type 2 diabetes mellitus, chronic pain on opioids, hypertension, depression, anxiety, asthma, s/p cholecystectomy, CVA with residual vision loss, PCOS, sleep apnea who was brought to the ED by EMS from home for worsening abdominal painassociated with diarrhea. Recently treated for UTI with no improvement in abd pain.[place] home and largely immobile. Reported chronicdepressionand sometimes has suicidal ideations but none now. ED Course: Afebrile,noted to be hypertensive, tachycardic.Found to have leukocytosis of 26K and blood sugar elevated in the 300s. CT abdomen pelvis without contrast (no contrast due to renal dysfunction)showed perforated sigmoid diverticulitis with a large 12 cm abscess in addition to complex fluid collections extending into the patient's pelvis. ED provider spoke to surgeon on call Dr. Gala Lewandowsky who indicated no surgical need at this time and recommended IR consult for drainage of the abscess. Hospital course: [facility] and IR consulted. Patient underwent CT guided drain x3 placement on 1/10.   Assessment & Plan:   Principal Problem:   Diverticulitis of large intestine with abscess Active Problems:   Essential hypertension   Asthma   Obesity, Class III, BMI 40-49.9 (morbid obesity) (HCC)   Type 2 diabetes mellitus with stage 3 chronic kidney disease (HCC)   Diverticulitis   Abscess of sigmoid colon   History of pulmonary embolus (PE)   AKI (acute kidney injury) ([place])   Hyponatremia   Hyperglycemia  #1 perforated sigmoid diverticulitis with complicated large abscess 12 cm-she has 3 drains in place.  Still draining C. difficile negative On Zosyn Continue Dilaudid for pain control Drains placed by IR Followed by surgery WBC 20.8 today Clear liquids  per general surgery  #2 AKI on CKD stage III A- creatinine 1.86 on admission peaked to 2.2 and trending down.  Continue to hold ACE inhibitor.  She received IV fluids.  Creatinine 1.28 today.  #3 type 2 diabetes with hyperglycemia on Lantus 60 units at bedtime with sliding scale insulin. CBG (last 3)  Recent Labs    11/03/19 0403 11/03/19 0738 11/03/19 1150  GLUCAP 89 99 113*    #4 essential hypertension on lisinopril at home which is on hold.  Blood pressure stable 128/65  #5 history of asthma stable continue inhalers and encourage incentive spirometry.  Continue Singulair and Dulera.  #6 morbid obesity needs ongoing counseling regarding weight loss  #7 depression continue home meds Zoloft 200 mg daily.  #8 pyuria with rare bacteriuria initially treated for UTI.  #9 pseudohyponatremia resolved sodium 135  #10 hyperlipidemia continue statin Lipitor 40 mg daily  Nutrition Problem: Increased nutrient needs Etiology: acute illness     Signs/Symptoms: estimated needs    Interventions: Prostat(Ensure Max)  Estimated body mass index is 58.93 kg/m as calculated from the following:   Height as of this encounter: 5\' 8"  (1.727 m).   Weight as of this encounter: 175.8 kg.  DVT prophylaxis: Heparin Code Status: Family Communication: Discussed with patient  disposition Plan pending further clinical improvement patient still on clear liquids and has 3 drains in place followed by IR and surgery and need for SNF bed  Consultants:   IR and general surgery   Procedures:  Antimicrobials  Subjective: She is resting in bed complains of lower abdominal discomfort Denies any dysuria nausea vomiting Tolerating clear liquids She sat up in chair for 2 hours yesterday and there  was a lot for her since she has not moved around since admission.  Objective: Vitals:   11/02/19 2030 11/02/19 2102 11/03/19 0406 11/03/19 0812  BP: (!) 115/93  128/65   Pulse: 81  80   Resp: (!) 22   20   Temp: 98.7 F (37.1 C)  97.9 F (36.6 C)   TempSrc: Oral  Oral   SpO2: 99% 97% 96% 97%  Weight:      Height:        Intake/Output Summary (Last 24 hours) at 11/03/2019 1308 Last data filed at 11/03/2019 0801 Gross per 24 hour  Intake 323.57 ml  Output 920 ml  Net -596.43 ml   Filed Weights   10/31/19 0200  Weight: (!) 175.8 kg    Examination:  General exam: Appears calm and comfortable  Respiratory system: Clear to auscultation. Respiratory effort normal. Cardiovascular system: S1 & S2 heard, RRR. No JVD, murmurs, rubs, gallops or clicks. No pedal edema. Gastrointestinal system: Abdomen is distended, soft and nontender. No organomegaly or masses felt. Normal bowel sounds heard.  2 drains in the left side and 1 drain in the right side of the abdomen  Central nervous system: Alert and oriented. No focal neurological deficits. Extremities: Symmetric 5 x 5 power. Skin: No rashes, lesions or ulcers Psychiatry: Judgement and insight appear normal. Mood & affect appropriate.     Data Reviewed: I have personally reviewed following labs and imaging studies  CBC: Recent Labs  Lab 10/30/19 1748 10/31/19 0558 11/01/19 0526 11/02/19 0503 11/03/19 0618  WBC 26.6* 18.0* 21.5* 25.4* 20.8*  HGB 12.1 9.8* 9.6* 9.8* 9.3*  HCT 41.1 32.4* 32.0* 32.8* 31.4*  MCV 87.6 87.1 88.2 86.8 88.0  PLT 496* 356 317 352 694   Basic Metabolic Panel: Recent Labs  Lab 10/30/19 1748 10/31/19 0558 11/01/19 0526 11/02/19 0503 11/03/19 0618  NA 129* 133* 138 136 135  K 5.1 4.7 4.1 4.3 4.3  CL 98 101 103 105 105  CO2 18* 22 22 22 22   GLUCOSE 398* 307* 162* 115* 94  BUN 26* 36* 44* 36* 28*  CREATININE 1.86* 2.21* 1.98* 1.56* 1.28*  CALCIUM 9.1 8.3* 8.1* 8.3* 8.2*  MG  --   --  1.7 1.8 1.9  PHOS  --   --  4.2 4.4 4.2   GFR: Estimated Creatinine Clearance: 87.2 mL/min (A) (by C-G formula based on SCr of 1.28 mg/dL (H)). Liver Function Tests: Recent Labs  Lab 10/30/19 1748  11/01/19 0526 11/02/19 0503 11/03/19 0618  AST 20 23 54* 31  ALT 39 25 43 33  ALKPHOS 97 83 178* 201*  BILITOT 1.2 0.6 0.7 0.3  PROT 7.5 6.1* 6.4* 6.1*  ALBUMIN 3.1* 2.2* 2.1* 1.9*   Recent Labs  Lab 10/30/19 1748  LIPASE 23   No results for input(s): AMMONIA in the last 168 hours. Coagulation Profile: Recent Labs  Lab 10/31/19 1056 11/01/19 0526 11/02/19 0503 11/03/19 0618  INR 1.4* 1.4* 1.3* 1.3*   Cardiac Enzymes: No results for input(s): CKTOTAL, CKMB, CKMBINDEX, TROPONINI in the last 168 hours. BNP (last 3 results) No results for input(s): PROBNP in the last 8760 hours. HbA1C: No results for input(s): HGBA1C in the last 72 hours. CBG: Recent Labs  Lab 11/02/19 2033 11/02/19 2359 11/03/19 0403 11/03/19 0738 11/03/19 1150  GLUCAP 99 91 89 99 113*   Lipid Profile: No results for input(s): CHOL, HDL, LDLCALC, TRIG, CHOLHDL, LDLDIRECT in the last 72 hours. Thyroid Function Tests: No results for input(s):  TSH, T4TOTAL, FREET4, T3FREE, THYROIDAB in the last 72 hours. Anemia Panel: No results for input(s): VITAMINB12, FOLATE, FERRITIN, TIBC, IRON, RETICCTPCT in the last 72 hours. Sepsis Labs: Recent Labs  Lab 10/30/19 1916 10/31/19 0558  LATICACIDVEN 1.3 1.4    Recent Results (from the past 240 hour(s))  Respiratory Panel by RT PCR (Flu A&B, Covid) - Nasopharyngeal Swab     Status: None   Collection Time: 10/30/19  7:25 PM   Specimen: Nasopharyngeal Swab  Result Value Ref Range Status   SARS Coronavirus 2 by RT PCR NEGATIVE NEGATIVE Final    Comment: (NOTE) SARS-CoV-2 target nucleic acids are NOT DETECTED. The SARS-CoV-2 RNA is generally detectable in upper respiratoy specimens during the acute phase of infection. The lowest concentration of SARS-CoV-2 viral copies this assay can detect is 131 copies/mL. A negative result does not preclude SARS-Cov-2 infection and should not be used as the sole basis for treatment or other patient management  decisions. A negative result may occur with  improper specimen collection/handling, submission of specimen other than nasopharyngeal swab, presence of viral mutation(s) within the areas targeted by this assay, and inadequate number of viral copies (<131 copies/mL). A negative result must be combined with clinical observations, patient history, and epidemiological information. The expected result is Negative. Fact Sheet for Patients:  PinkCheek.be Fact Sheet for Healthcare Providers:  GravelBags.it This test is not yet ap proved or cleared by the [place] FDA and  has been authorized for detection and/or diagnosis of SARS-CoV-2 by FDA under an Emergency Use Authorization (EUA). This EUA will remain  in effect (meaning this test can be used) for the duration of the COVID-19 declaration under Section 564(b)(1) of the Act, 21 U.S.C. section 360bbb-3(b)(1), unless the authorization is terminated or revoked sooner.    Influenza A by PCR NEGATIVE NEGATIVE Final   Influenza B by PCR NEGATIVE NEGATIVE Final    Comment: (NOTE) The Xpert Xpress SARS-CoV-2/FLU/RSV assay is intended as an aid in  the diagnosis of influenza from Nasopharyngeal swab specimens and  should not be used as a sole basis for treatment. Nasal washings and  aspirates are unacceptable for Xpert Xpress SARS-CoV-2/FLU/RSV  testing. Fact Sheet for Patients: PinkCheek.be Fact Sheet for Healthcare Providers: GravelBags.it This test is not yet approved or cleared by the [place] FDA and  has been authorized for detection and/or diagnosis of SARS-CoV-2 by  FDA under an Emergency Use Authorization (EUA). This EUA will remain  in effect (meaning this test can be used) for the duration of the  Covid-19 declaration under Section 564(b)(1) of the Act, 21  U.S.C. section 360bbb-3(b)(1), unless the authorization  is  terminated or revoked. Performed at [facility], [place] [address] [place], [place] [address]   Culture, blood (routine x 2)     Status: None (Preliminary result)   Collection Time: 10/30/19  8:13 PM   Specimen: BLOOD  Result Value Ref Range Status   Specimen Description   Final    BLOOD LEFT HAND Performed at [facility] [address], [place], [place] [address]    Special Requests   Final    BOTTLES DRAWN AEROBIC AND ANAEROBIC Blood Culture adequate volume Performed at [facility] [address] [place], [place] [address]    Culture   Final    NO GROWTH 3 DAYS Performed at [facility], [place] [address], [place], [place] [address]    Report Status PENDING  Incomplete  Culture, blood (routine x 2)     Status: None (Preliminary result)   Collection Time: 10/30/19 11:00 PM   Specimen: BLOOD RIGHT HAND  Result Value Ref Range Status   Specimen Description   Final    BLOOD RIGHT HAND Performed at [facility] [address] [place], [place] [address]    Special Requests   Final    BOTTLES DRAWN AEROBIC AND ANAEROBIC Blood Culture adequate volume Performed at [facility] [address], [place], [place] [address]    Culture   Final    NO GROWTH 3 DAYS Performed at [facility], [place] [address], [place], [place] [address]    Report Status PENDING  Incomplete  C difficile quick scan w PCR reflex     Status: None   Collection Time: 10/31/19  8:16 AM   Specimen: STOOL  Result Value Ref Range Status   C Diff antigen NEGATIVE NEGATIVE Final   C Diff toxin NEGATIVE NEGATIVE Final   C Diff interpretation No C. difficile detected.  Final    Comment: Performed at [facility], [place] [address] [place], [place] [address]  Aerobic/Anaerobic Culture (surgical/deep wound)     Status: None (Preliminary result)   Collection Time: 10/31/19  4:11 PM    Specimen: Abscess  Result Value Ref Range Status   Specimen Description   Final    ABSCESS Performed at [facility] [address], [place], [place] [address]    Special Requests   Final    1 LEFT ABDOMINAL LATERAL Performed at [facility], [place] [address], [place], [place] [address]    Gram Stain   Final    ABUNDANT WBC PRESENT, PREDOMINANTLY PMN ABUNDANT GRAM POSITIVE COCCI IN PAIRS IN CLUSTERS IN CHAINS FEW GRAM NEGATIVE COCCOBACILLI RARE GRAM POSITIVE RODS Performed at [facility], [place] [address], [place], [place] [address]    Culture   Final    MODERATE ESCHERICHIA COLI NO ANAEROBES ISOLATED; CULTURE IN PROGRESS FOR 5 DAYS    Report Status PENDING  Incomplete   Organism ID, Bacteria ESCHERICHIA COLI  Final      Susceptibility   Escherichia coli - MIC*    AMPICILLIN >=32 RESISTANT Resistant     CEFAZOLIN <=4 SENSITIVE Sensitive     CEFEPIME <=0.12 SENSITIVE Sensitive     CEFTAZIDIME <=1 SENSITIVE Sensitive     CEFTRIAXONE <=0.25 SENSITIVE Sensitive     CIPROFLOXACIN >=4 RESISTANT Resistant     GENTAMICIN >=16 RESISTANT Resistant     IMIPENEM <=0.25 SENSITIVE Sensitive     TRIMETH/SULFA >=320 RESISTANT Resistant     AMPICILLIN/SULBACTAM >=32 RESISTANT Resistant     PIP/TAZO <=4 SENSITIVE Sensitive     * MODERATE ESCHERICHIA COLI  Aerobic/Anaerobic Culture (surgical/deep wound)     Status: None (Preliminary result)   Collection Time: 10/31/19  4:19 PM   Specimen: Abscess  Result Value Ref Range Status   Specimen Description   Final    ABSCESS Performed at [facility] [address], [place], [place] [address]    Special Requests   Final    2 LEFT ABDOMEN MEDIAL Performed at [facility], [place] [address] [place], [place] [address]    Gram Stain   Final    FEW WBC PRESENT,BOTH PMN AND MONONUCLEAR ABUNDANT GRAM POSITIVE COCCI IN PAIRS IN CHAINS RARE GRAM POSITIVE RODS RARE GRAM  NEGATIVE RODS    Culture  Final    CULTURE REINCUBATED FOR BETTER GROWTH Performed at [facility], [place] [address] [address], [place], [place] [address]    Report Status PENDING  Incomplete         Radiology Studies: DG CHEST PORT 1 VIEW  Result Date: 11/02/2019 CLINICAL DATA:  54 year old female with dyspnea EXAM: PORTABLE CHEST 1 VIEW COMPARISON:  Chest radiograph dated 07/03/2018. FINDINGS: Shallow inspiration. No focal consolidation, pleural effusion, pneumothorax. Mild cardiomegaly. No acute osseous pathology. IMPRESSION: 1. No acute cardiopulmonary process. 2. Stable mild cardiomegaly. Electronically Signed   By: Anner Crete M.D.   On: 11/02/2019 20:50        Scheduled Meds: . atorvastatin  40 mg Oral Daily  . cholecalciferol  2,000 Units Oral Daily  . feeding supplement (PRO-STAT SUGAR FREE 64)  30 mL Oral BID  . insulin aspart  0-20 Units Subcutaneous Q4H  . insulin aspart  10 Units Subcutaneous TID WC  . insulin glargine  60 Units Subcutaneous QHS  . mometasone-formoterol  2 puff Inhalation BID  . montelukast  10 mg Oral QHS  . Ensure Max Protein  11 oz Oral BID  . saccharomyces boulardii  250 mg Oral BID  . sertraline  200 mg Oral Daily  . sodium chloride flush  3 mL Intravenous Q12H  . sodium chloride flush  5 mL Intracatheter Q8H  . sodium chloride flush  5 mL Intracatheter Q8H   Continuous Infusions: . heparin 2,200 Units/hr (11/03/19 0935)  . piperacillin-tazobactam (ZOSYN)  IV 3.375 g (11/03/19 1246)     LOS: 4 days     Georgette Shell, MD [facility]  If 7PM-7AM, please contact night-coverage [email or web address] Password [email or web address] 11/03/2019, 1:08 PM

## 2019-11-03 NOTE — Progress Notes (Signed)
ANTICOAGULATION CONSULT NOTE - follow up  Pharmacy Consult for Heparin Indication: H/O PE (on Eliquis PTA) - bridge therapy until oral anticoagulation can resume  Allergies  Allergen Reactions  . Cafergot Other (See Comments)    Chest pain; ergotamine-caffiene  . Glucophage [Metformin Hcl] Anaphylaxis  . Canagliflozin Swelling and Other (See Comments)    Legs Swell invokana Legs Swell    Patient Measurements: Height: 5\' 8"  (172.7 cm) Weight: (!) 387 lb 9.1 oz (175.8 kg) IBW/kg (Calculated) : 63.9 Heparin Dosing Weight: 108 kg  Vital Signs: Temp: 97.5 F (36.4 C) (01/13 1355) Temp Source: Oral (01/13 1355) BP: 136/73 (01/13 1355) Pulse Rate: 78 (01/13 1355)  Labs: Recent Labs    11/01/19 0526 11/02/19 0503 11/03/19 0618 11/03/19 1451  HGB 9.6* 9.8* 9.3*  --   HCT 32.0* 32.8* 31.4*  --   PLT 317 352 309  --   LABPROT 17.2* 16.0* 15.6*  --   INR 1.4* 1.3* 1.3*  --   HEPARINUNFRC  --   --  0.22* 0.24*  CREATININE 1.98* 1.56* 1.28*  --     Estimated Creatinine Clearance: 87.2 mL/min (A) (by C-G formula based on SCr of 1.28 mg/dL (H)).   Assessment:  64yr female admitted on 10/30/19 with performated sigmoid diverticulitis with abscess.  Patient on Eliquis PTA for h/o PE.  Eliquis held on admission pending potential procedures.  Abscess drains placed 1/10.    Pharmacy consulted to begin IV heparin without bolus for anticoagulation until cleared by general surgery for oral anticoagulation  Today, 11/03/19  2nd heparin level remains slightlhy SUBtherapeutic at 0.24 after heparin drip increased to 2200 units/hr from 900 units/hr  CBC stable  No reported bleeding or problems noted per RN  1 of 3 abd drains removed by IR today  Goal of Therapy:  Heparin level 0.3-0.7 units/ml Monitor platelets by anticoagulation protocol: Yes   Plan:   Per MD's instruction, no heparin bolus.   Increase IV heparin from 2200units/hr to 2400 units/hr  Recheck heparin level 6 hr  after heparin increased  Check heparin level and CBC daily   Eudelia Bunch, Pharm.D [phone] 11/03/2019 3:32 PM

## 2019-11-03 NOTE — Progress Notes (Signed)
Initial Nutrition Assessment  DOCUMENTATION CODES:   Morbid obesity  INTERVENTION:   -Ensure MAX Protein po BID, each supplement provides 150 kcal and 30 grams of protein -Prostat liquid protein PO 30 ml BID with meals, each supplement provides 100 kcal, 15 grams protein.  NUTRITION DIAGNOSIS:   Increased nutrient needs related to acute illness as evidenced by estimated needs.  GOAL:   Patient will meet greater than or equal to 90% of their needs  MONITOR:   PO intake, Supplement acceptance, Labs, Weight trends, I & O's  REASON FOR ASSESSMENT:   Consult Assessment of nutrition requirement/status  ASSESSMENT:   54 year old Caucasian female with PMH of morbid obesity, type 2 diabetes mellitus, chronic pain on opioids, hypertension, depression, anxiety, asthma, s/p cholecystectomy, CVA with residual vision loss, PCOS, sleep apnea who was brought to the ED by EMS from home for worsening abdominal pain associated with diarrhea. Recently treated for UTI with no improvement in abd pain.  1/10: s/p abdominal drain placement x 3 for perforated sigmoid diverticulitis   **RD working remotely**  Patient now on full liquids as of today. Surgery consulted RD to help maximize protein intake to aid in healing from abscess. Will order Ensure Max and Prostat to provide ~90g of protein daily.   No weight changes per weight records.  I/Os: -142 ml since admit Drains: 120 ml  Medications: Vitamin D tablet, Florastor capsule, IV Zofran PRN  Labs reviewed:  CBGs: 99-113 Mg/Phos WNL  NUTRITION - FOCUSED PHYSICAL EXAM:  Working remotely.  Diet Order:   Diet Order            Diet full liquid Room service appropriate? Yes; Fluid consistency: Thin  Diet effective now              EDUCATION NEEDS:   No education needs have been identified at this time  Skin:  Skin Assessment: Reviewed RN Assessment  Last BM:  1/13-type 7  Height:   Ht Readings from Last 1 Encounters:   10/31/19 5\' 8"  (1.727 m)    Weight:   Wt Readings from Last 1 Encounters:  10/31/19 (!) 175.8 kg    Ideal Body Weight:  63.6 kg  BMI:  Body mass index is 58.93 kg/m.  Estimated Nutritional Needs:   Kcal:  2200-2400  Protein:  100-120g  Fluid:  2.2L/day  Clayton Bibles, MS, RD, LDN Inpatient Clinical Dietitian Pager: [phone] After Hours Pager: [phone]
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
## 2019-11-03 NOTE — Progress Notes (Signed)
Referring Physician(s): Dr. Harlow Asa  Supervising Physician: Jacqulynn Cadet  Patient Status:  [facility] - In-pt  Chief Complaint: Follow up abdominal drain placement x 3 on 10/31/19 with Dr. Anselm Pancoast  Subjective:  Patient sitting up in chair, liquid lunch tray on table. No new complaints  Allergies: Cafergot, Glucophage [metformin hcl], and Canagliflozin  Medications:  Current Facility-Administered Medications:  .  acetaminophen (TYLENOL) tablet 650 mg, 650 mg, Oral, Q6H PRN, Lucky Cowboy, MD, 650 mg at 11/03/19 0936 .  albuterol (PROVENTIL) (2.5 MG/3ML) 0.083% nebulizer solution 3 mL, 3 mL, Nebulization, TID PRN, Lucky Cowboy, MD .  atorvastatin (LIPITOR) tablet 40 mg, 40 mg, Oral, Daily, Lucky Cowboy, MD, 40 mg at 11/03/19 0936 .  cholecalciferol (VITAMIN D) tablet 2,000 Units, 2,000 Units, Oral, Daily, Lucky Cowboy, MD, 2,000 Units at 11/03/19 [phone] .  feeding supplement (PRO-STAT SUGAR FREE 64) liquid 30 mL, 30 mL, Oral, BID, Georgette Shell, MD .  heparin ADULT infusion 100 units/mL (25000 units/259mL sodium chloride 0.45%), 2,200 Units/hr, Intravenous, Continuous, Adrian Saran, [name], Last Rate: 22 mL/hr at 11/03/19 0935, 2,200 Units/hr at 11/03/19 0935 .  HYDROmorphone (DILAUDID) injection 0.5 mg, 0.5 mg, Intravenous, Q2H PRN, Arma Heading, MD, 0.5 mg at 11/02/19 1735 .  insulin aspart (novoLOG) injection 0-20 Units, 0-20 Units, Subcutaneous, Q4H, Lucky Cowboy, MD, 3 Units at 11/01/19 1759 .  insulin aspart (novoLOG) injection 10 Units, 10 Units, Subcutaneous, TID WC, Arma Heading, MD, 10 Units at 11/02/19 1735 .  insulin glargine (LANTUS) injection 60 Units, 60 Units, Subcutaneous, QHS, Lucky Cowboy, MD, 60 Units at 11/02/19 2152 .  mometasone-formoterol (DULERA) 200-5 MCG/ACT inhaler 2 puff, 2 puff, Inhalation, BID, Lucky Cowboy, MD, 2 puff at 11/03/19 0813 .  montelukast (SINGULAIR) tablet 10 mg, 10 mg, Oral, QHS, Lucky Cowboy, MD, 10 mg at 11/02/19  2151 .  ondansetron (ZOFRAN) injection 4 mg, 4 mg, Intravenous, Q6H PRN, Arma Heading, MD, 4 mg at 11/03/19 0837 .  oxyCODONE (Oxy IR/ROXICODONE) immediate release tablet 5-10 mg, 5-10 mg, Oral, Q4H PRN, Earnstine Regal, PA-C, 10 mg at 11/03/19 1302 .  piperacillin-tazobactam (ZOSYN) IVPB 3.375 g, 3.375 g, Intravenous, Q8H, Lucky Cowboy, MD, Last Rate: 12.5 mL/hr at 11/03/19 1246, 3.375 g at 11/03/19 1246 .  protein supplement (ENSURE MAX) liquid, 11 oz, Oral, BID, Georgette Shell, MD .  saccharomyces boulardii [facility]) capsule 250 mg, 250 mg, Oral, BID, Earnstine Regal, PA-C, 250 mg at 11/03/19 4401 .  sertraline (ZOLOFT) tablet 200 mg, 200 mg, Oral, Daily, Lucky Cowboy, MD, 200 mg at 11/03/19 0936 .  sodium chloride flush (NS) 0.9 % injection 3 mL, 3 mL, Intravenous, Q12H, Lucky Cowboy, MD, 3 mL at 11/03/19 0937 .  sodium chloride flush (NS) 0.9 % injection 5 mL, 5 mL, Intracatheter, Q8H, Henn, Adam, MD, 5 mL at 11/03/19 0801 .  sodium chloride flush (NS) 0.9 % injection 5 mL, 5 mL, Intracatheter, Q8H, Henn, Adam, MD, 5 mL at 11/03/19 0801    Vital Signs: BP 128/65 (BP Location: Left Arm)   Pulse 80   Temp 97.9 F (36.6 C) (Oral)   Resp 20   Ht 5\' 8"  (1.727 m)   Wt (!) 175.8 kg   SpO2 97%   BMI 58.93 kg/m   Physical Exam Vitals and nursing note reviewed.  Constitutional:      General: She is not in acute distress.    Appearance: She is obese.  HENT:     Head: Normocephalic.  Cardiovascular:     Rate and Rhythm: Normal rate.  Pulmonary:     Effort: Pulmonary effort is normal.  Abdominal:     General: There is no distension.     Palpations: Abdomen is soft.     Tenderness: Tenderness: over drain insertion sites mostly.     Comments: (+) Left 12 Fr drain with scant serosanguinous output, removed at bedside (+) Left 10 Fr drain with purulent output - No leakage/bleeding noted. Insertion site unremarkable. (+) Right 12 Fr drain with thin tan foul smelling  output. No leakage/bleeding noted. Insertion site unremarkable.   Skin:    General: Skin is warm and dry.  Neurological:     Mental Status: She is alert. Mental status is at baseline.     Imaging: CT Abdomen Pelvis Wo Contrast  Result Date: 10/30/2019 CLINICAL DATA:  Right lower quadrant abdominal pain. EXAM: CT ABDOMEN AND PELVIS WITHOUT CONTRAST TECHNIQUE: Multidetector CT imaging of the abdomen and pelvis was performed following the standard protocol without IV contrast. COMPARISON:  March 21, 2017. FINDINGS: Lower chest: There are few small stable bibasilar pulmonary nodules.The heart size is normal. Hepatobiliary: There is decreased hepatic attenuation suggestive of hepatic steatosis. Status post cholecystectomy.There is no biliary ductal dilation. Pancreas: Normal contours without ductal dilatation. No peripancreatic fluid collection. Spleen: No splenic laceration or hematoma. Adrenals/Urinary Tract: --Adrenal glands: No adrenal hemorrhage. --Right kidney/ureter: No hydronephrosis or perinephric hematoma. --Left kidney/ureter: No hydronephrosis or perinephric hematoma. --Urinary bladder: Unremarkable. Stomach/Bowel: --Stomach/Duodenum: No hiatal hernia or other gastric abnormality. Normal duodenal course and caliber. --Small bowel: No dilatation or inflammation. --Colon: Findings are concerning for perforated sigmoid colitis or diverticulitis. There is wall thickening of the sigmoid colon with a large adjacent abscess superiorly measuring approximately 12 x 8 by 9.5 cm. This collection appears to extend inferiorly and cross the patient's midline to an additional collection measuring approximately 5.4 x 3.4 cm. The patient's left ovary is not reliably identified and may abut these collections. --Appendix: Normal. Vascular/Lymphatic: Atherosclerotic calcification is present within the non-aneurysmal abdominal aorta, without hemodynamically significant stenosis. --there are mildly enlarged retroperitoneal  lymph nodes, presumably reactive. --there are mildly enlarged mesenteric lymph nodes, presumably reactive. --there are mildly enlarged pelvic lymph nodes, presumably reactive. Reproductive: Unremarkable Other: No ascites or free air. There is a small volume of free fluid in the patient's pelvis. Musculoskeletal. No acute displaced fractures. IMPRESSION: 1. Findings are most consistent with perforated sigmoid diverticulitis/colitis. There is a large 12 cm abscess as detailed above in addition to other complex fluid collections extending into the patient's pelvis. 2. Normal appendix in the right lower quadrant. 3. Status post cholecystectomy. Aortic Atherosclerosis (ICD10-I70.0). Electronically Signed   By: Constance Holster M.D.   On: 10/30/2019 20:07   DG CHEST PORT 1 VIEW  Result Date: 11/02/2019 CLINICAL DATA:  54 year old female with dyspnea EXAM: PORTABLE CHEST 1 VIEW COMPARISON:  Chest radiograph dated 07/03/2018. FINDINGS: Shallow inspiration. No focal consolidation, pleural effusion, pneumothorax. Mild cardiomegaly. No acute osseous pathology. IMPRESSION: 1. No acute cardiopulmonary process. 2. Stable mild cardiomegaly. Electronically Signed   By: Anner Crete M.D.   On: 11/02/2019 20:50   CT IMAGE GUIDED DRAINAGE BY PERCUTANEOUS CATHETER  Result Date: 10/31/2019 INDICATION: 54 year old with intra-abdominal fluid collections and suspect colonic diverticular abscesses. EXAM: CT GUIDED DRAINAGE OF INTRA-ABDOMINAL ABSCESS X 3 MEDICATIONS: Moderate sedation ANESTHESIA/SEDATION: 4.0 mg IV Versed 200 mcg IV Fentanyl Moderate Sedation Time:  94 minutes The patient was  continuously monitored during the procedure by the interventional radiology nurse under my direct supervision. COMPLICATIONS: None immediate. TECHNIQUE: The procedure was explained to the patient. The risks and benefits of the procedure were discussed and the patient's questions were addressed. Informed consent was obtained from the  patient. Patient was placed supine on the CT scanner and CT images through the lower abdomen and pelvis were obtained. PROCEDURE: Collections in the left lower abdomen were identified and targeted. Left side of the abdomen was prepped with chlorhexidine and sterile field was created. Skin and soft tissues were anesthetized with 1% lidocaine. Using CT guidance, an 18 gauge trocar needle was directed into the most lateral component of the left lower abdominal collection. Yellow purulent fluid was aspirated. Stiff Amplatz wire was placed and the tract was dilated to accommodate a 12 [place] multipurpose drain. Approximately 25 mL of yellow purulent fluid was removed from this collection. However, this collection appears to be separate from the adjacent air-fluid collection. This catheter was sutured to skin. Attention was directed to the left lower abdominal abscess containing gas that is medial to the recently placed drain. Skin was anesthetized more medial and an 18 gauge needle was directed into the air-fluid collection using CT guidance. Brown purulent fluid was aspirated. Stiff Amplatz wire was advanced into the collection and the tract was dilated to accommodate a 10.2 [place] multipurpose drain. Approximately 60 mL of brown purulent fluid was removed from this collection. Air was also aspirated from this collection. Catheter was sutured to skin. Attention was directed to the large collection in the right lower abdomen. The right side of the abdomen was prepped and draped in sterile fashion. Skin was anesthetized with 1% lidocaine. Using CT guidance, 18 gauge trocar needle was directed into the right lower abdominal collection. Cloudy yellow fluid was removed. Stiff Amplatz wire was advanced into the collection and the tract was dilated to accommodate a 12 [place] multipurpose drain. Approximately 530 mL mL cloudy yellow fluid was removed from the right lower abdominal drain. Catheter was sutured to skin. Dressings  were placed on all the drains. FINDINGS: Complex air-fluid collection in the left lower abdomen adjacent to the sigmoid colon. Large air-fluid collection in the right lower abdomen that extends to the midline. Drain #1 was placed in the lateral component of the left lower abdominal collection. This collection may be associated with the left adnexa or ovary. Although 25 mL of purulent fluid was removed from the lateral component, this collection did not appear to be contiguous with the adjacent air-fluid collection. Therefore, Drain #2 was placed within the air-fluid collection in the left lower abdominal collection. 60 mL of brown purulent fluid was removed from this drain. Large amount of cloudy yellow fluid was removed from Drain #3 in the right lower abdomen. IMPRESSION: Successful placement of 3 drains within the lower abdominal abscess collections using CT guidance. Electronically Signed   By: Markus Daft M.D.   On: 10/31/2019 17:34   CT IMAGE GUIDED DRAINAGE BY PERCUTANEOUS CATHETER  Result Date: 10/31/2019 INDICATION: 54 year old with intra-abdominal fluid collections and suspect colonic diverticular abscesses. EXAM: CT GUIDED DRAINAGE OF INTRA-ABDOMINAL ABSCESS X 3 MEDICATIONS: Moderate sedation ANESTHESIA/SEDATION: 4.0 mg IV Versed 200 mcg IV Fentanyl Moderate Sedation Time:  94 minutes The patient was continuously monitored during the procedure by the interventional radiology nurse under my direct supervision. COMPLICATIONS: None immediate. TECHNIQUE: The procedure was explained to the patient. The risks and benefits of the procedure were discussed and the patient's questions  were addressed. Informed consent was obtained from the patient. Patient was placed supine on the CT scanner and CT images through the lower abdomen and pelvis were obtained. PROCEDURE: Collections in the left lower abdomen were identified and targeted. Left side of the abdomen was prepped with chlorhexidine and sterile field was  created. Skin and soft tissues were anesthetized with 1% lidocaine. Using CT guidance, an 18 gauge trocar needle was directed into the most lateral component of the left lower abdominal collection. Yellow purulent fluid was aspirated. Stiff Amplatz wire was placed and the tract was dilated to accommodate a 12 [place] multipurpose drain. Approximately 25 mL of yellow purulent fluid was removed from this collection. However, this collection appears to be separate from the adjacent air-fluid collection. This catheter was sutured to skin. Attention was directed to the left lower abdominal abscess containing gas that is medial to the recently placed drain. Skin was anesthetized more medial and an 18 gauge needle was directed into the air-fluid collection using CT guidance. Brown purulent fluid was aspirated. Stiff Amplatz wire was advanced into the collection and the tract was dilated to accommodate a 10.2 [place] multipurpose drain. Approximately 60 mL of brown purulent fluid was removed from this collection. Air was also aspirated from this collection. Catheter was sutured to skin. Attention was directed to the large collection in the right lower abdomen. The right side of the abdomen was prepped and draped in sterile fashion. Skin was anesthetized with 1% lidocaine. Using CT guidance, 18 gauge trocar needle was directed into the right lower abdominal collection. Cloudy yellow fluid was removed. Stiff Amplatz wire was advanced into the collection and the tract was dilated to accommodate a 12 [place] multipurpose drain. Approximately 530 mL mL cloudy yellow fluid was removed from the right lower abdominal drain. Catheter was sutured to skin. Dressings were placed on all the drains. FINDINGS: Complex air-fluid collection in the left lower abdomen adjacent to the sigmoid colon. Large air-fluid collection in the right lower abdomen that extends to the midline. Drain #1 was placed in the lateral component of the left lower  abdominal collection. This collection may be associated with the left adnexa or ovary. Although 25 mL of purulent fluid was removed from the lateral component, this collection did not appear to be contiguous with the adjacent air-fluid collection. Therefore, Drain #2 was placed within the air-fluid collection in the left lower abdominal collection. 60 mL of brown purulent fluid was removed from this drain. Large amount of cloudy yellow fluid was removed from Drain #3 in the right lower abdomen. IMPRESSION: Successful placement of 3 drains within the lower abdominal abscess collections using CT guidance. Electronically Signed   By: Markus Daft M.D.   On: 10/31/2019 17:34   CT IMAGE GUIDED DRAINAGE BY PERCUTANEOUS CATHETER  Result Date: 10/31/2019 INDICATION: 54 year old with intra-abdominal fluid collections and suspect colonic diverticular abscesses. EXAM: CT GUIDED DRAINAGE OF INTRA-ABDOMINAL ABSCESS X 3 MEDICATIONS: Moderate sedation ANESTHESIA/SEDATION: 4.0 mg IV Versed 200 mcg IV Fentanyl Moderate Sedation Time:  94 minutes The patient was continuously monitored during the procedure by the interventional radiology nurse under my direct supervision. COMPLICATIONS: None immediate. TECHNIQUE: The procedure was explained to the patient. The risks and benefits of the procedure were discussed and the patient's questions were addressed. Informed consent was obtained from the patient. Patient was placed supine on the CT scanner and CT images through the lower abdomen and pelvis were obtained. PROCEDURE: Collections in the left lower abdomen were identified and  targeted. Left side of the abdomen was prepped with chlorhexidine and sterile field was created. Skin and soft tissues were anesthetized with 1% lidocaine. Using CT guidance, an 18 gauge trocar needle was directed into the most lateral component of the left lower abdominal collection. Yellow purulent fluid was aspirated. Stiff Amplatz wire was placed and the  tract was dilated to accommodate a 12 [place] multipurpose drain. Approximately 25 mL of yellow purulent fluid was removed from this collection. However, this collection appears to be separate from the adjacent air-fluid collection. This catheter was sutured to skin. Attention was directed to the left lower abdominal abscess containing gas that is medial to the recently placed drain. Skin was anesthetized more medial and an 18 gauge needle was directed into the air-fluid collection using CT guidance. Brown purulent fluid was aspirated. Stiff Amplatz wire was advanced into the collection and the tract was dilated to accommodate a 10.2 [place] multipurpose drain. Approximately 60 mL of brown purulent fluid was removed from this collection. Air was also aspirated from this collection. Catheter was sutured to skin. Attention was directed to the large collection in the right lower abdomen. The right side of the abdomen was prepped and draped in sterile fashion. Skin was anesthetized with 1% lidocaine. Using CT guidance, 18 gauge trocar needle was directed into the right lower abdominal collection. Cloudy yellow fluid was removed. Stiff Amplatz wire was advanced into the collection and the tract was dilated to accommodate a 12 [place] multipurpose drain. Approximately 530 mL mL cloudy yellow fluid was removed from the right lower abdominal drain. Catheter was sutured to skin. Dressings were placed on all the drains. FINDINGS: Complex air-fluid collection in the left lower abdomen adjacent to the sigmoid colon. Large air-fluid collection in the right lower abdomen that extends to the midline. Drain #1 was placed in the lateral component of the left lower abdominal collection. This collection may be associated with the left adnexa or ovary. Although 25 mL of purulent fluid was removed from the lateral component, this collection did not appear to be contiguous with the adjacent air-fluid collection. Therefore, Drain #2 was  placed within the air-fluid collection in the left lower abdominal collection. 60 mL of brown purulent fluid was removed from this drain. Large amount of cloudy yellow fluid was removed from Drain #3 in the right lower abdomen. IMPRESSION: Successful placement of 3 drains within the lower abdominal abscess collections using CT guidance. Electronically Signed   By: Markus Daft M.D.   On: 10/31/2019 17:34    Labs:  CBC: Recent Labs    10/31/19 0558 11/01/19 0526 11/02/19 0503 11/03/19 0618  WBC 18.0* 21.5* 25.4* 20.8*  HGB 9.8* 9.6* 9.8* 9.3*  HCT 32.4* 32.0* 32.8* 31.4*  PLT 356 317 352 309    COAGS: Recent Labs    10/31/19 1056 11/01/19 0526 11/02/19 0503 11/03/19 0618  INR 1.4* 1.4* 1.3* 1.3*    BMP: Recent Labs    10/31/19 0558 11/01/19 0526 11/02/19 0503 11/03/19 0618  NA 133* 138 136 135  K 4.7 4.1 4.3 4.3  CL 101 103 105 105  CO2 22 22 22 22   GLUCOSE 307* 162* 115* 94  BUN 36* 44* 36* 28*  CALCIUM 8.3* 8.1* 8.3* 8.2*  CREATININE 2.21* 1.98* 1.56* 1.28*  GFRNONAA 25* 28* 38* 48*  GFRAA 29* 33* 44* 55*    LIVER FUNCTION TESTS: Recent Labs    10/30/19 1748 11/01/19 0526 11/02/19 0503 11/03/19 0618  BILITOT 1.2 0.6 0.7 0.3  AST 20 23 54* 31  ALT 39 25 43 33  ALKPHOS 97 83 178* 201*  PROT 7.5 6.1* 6.4* 6.1*  ALBUMIN 3.1* 2.2* 2.1* 1.9*    Assessment and Plan:  54 y/o F s/p abdominal drain placement x 3 for perforated sigmoid diverticulitis on 10/31/19 by Dr. Anselm Pancoast who was seen today for routine drain follow up  - Left sided 12 Fr drain removed at bedside, no complications.  - Left sided 10 Fr drain with 10 cc purulent output on exam - catheter is difficult to flush/aspirate initially however after 2 attempts significant return of purulent material into suction bulb occurred. - Right sided 12 Fr drain still with significant thin tan output, 80 cc on exam today - flushes/aspirates easily.    Continue current drain management - flush drains TID with 5  cc NS, record output Qshift, call IR if difficulty flushing or if there are other drain concerns. Will consider repeat imaging/drain injection if output from left sided drains continues to be <10 cc/24H (not including flush material)   IR will continue to follow. Please call with questions or concerns.    Electronically Signed: Ascencion Dike, PA-C   11/03/2019, 1:33 PM   I spent a total of 15 Minutes at the the patient's bedside AND on the patient's hospital floor or unit, greater than 50% of which was counseling/coordinating care for abdominal abscess drains x 2 follow up.

## 2019-11-04 LAB — GLUCOSE, CAPILLARY
Glucose-Capillary: 121 mg/dL — ABNORMAL HIGH (ref 70–99)
Glucose-Capillary: 113 mg/dL — ABNORMAL HIGH (ref 70–99)
Glucose-Capillary: 160 mg/dL — ABNORMAL HIGH (ref 70–99)
Glucose-Capillary: 175 mg/dL — ABNORMAL HIGH (ref 70–99)
Glucose-Capillary: 120 mg/dL — ABNORMAL HIGH (ref 70–99)

## 2019-11-04 LAB — HEPARIN LEVEL (UNFRACTIONATED)
Heparin Unfractionated: 0.28 IU/mL — ABNORMAL LOW (ref 0.30–0.70)
Heparin Unfractionated: 0.32 IU/mL (ref 0.30–0.70)

## 2019-11-04 LAB — MAGNESIUM: Magnesium: 1.7 mg/dL (ref 1.7–2.4)

## 2019-11-04 LAB — COMPREHENSIVE METABOLIC PANEL
ALT: 34 U/L (ref 0–44)
AST: 25 U/L (ref 15–41)
Albumin: 1.9 g/dL — ABNORMAL LOW (ref 3.5–5.0)
Alkaline Phosphatase: 274 U/L — ABNORMAL HIGH (ref 38–126)
Anion gap: 7 (ref 5–15)
BUN: 21 mg/dL — ABNORMAL HIGH (ref 6–20)
CO2: 24 mmol/L (ref 22–32)
Calcium: 8.3 mg/dL — ABNORMAL LOW (ref 8.9–10.3)
Chloride: 104 mmol/L (ref 98–111)
Creatinine, Ser: 1.04 mg/dL — ABNORMAL HIGH (ref 0.44–1.00)
GFR calc Af Amer: >60 mL/min (ref >60)
GFR calc non Af Amer: >60 mL/min (ref >60)
Glucose, Bld: 130 mg/dL — ABNORMAL HIGH (ref 70–99)
Potassium: 4.3 mmol/L (ref 3.5–5.1)
Sodium: 135 mmol/L (ref 135–145)
Total Bilirubin: 0.4 mg/dL (ref 0.3–1.2)
Total Protein: 6.1 g/dL — ABNORMAL LOW (ref 6.5–8.1)

## 2019-11-04 LAB — CBC
HCT: 32.1 % — ABNORMAL LOW (ref 36.0–46.0)
Hemoglobin: 9.5 g/dL — ABNORMAL LOW (ref 12.0–15.0)
MCH: 25.9 pg — ABNORMAL LOW (ref 26.0–34.0)
MCHC: 29.6 g/dL — ABNORMAL LOW (ref 30.0–36.0)
MCV: 87.5 fL (ref 80.0–100.0)
Platelets: 314 10*3/uL (ref 150–400)
RBC: 3.67 MIL/uL — ABNORMAL LOW (ref 3.87–5.11)
RDW: 14.9 % (ref 11.5–15.5)
WBC: 18.9 10*3/uL — ABNORMAL HIGH (ref 4.0–10.5)
nRBC: 0 % (ref 0.0–0.2)

## 2019-11-04 LAB — BASIC METABOLIC PANEL
Anion gap: 7 (ref 5–15)
BUN: 21 mg/dL — ABNORMAL HIGH (ref 6–20)
CO2: 24 mmol/L (ref 22–32)
Calcium: 8.2 mg/dL — ABNORMAL LOW (ref 8.9–10.3)
Chloride: 104 mmol/L (ref 98–111)
Creatinine, Ser: 0.92 mg/dL (ref 0.44–1.00)
GFR calc Af Amer: >60 mL/min (ref >60)
GFR calc non Af Amer: >60 mL/min (ref >60)
Glucose, Bld: 131 mg/dL — ABNORMAL HIGH (ref 70–99)
Potassium: 4.2 mmol/L (ref 3.5–5.1)
Sodium: 135 mmol/L (ref 135–145)

## 2019-11-04 LAB — AEROBIC/ANAEROBIC CULTURE W GRAM STAIN (SURGICAL/DEEP WOUND): Special Requests: 1

## 2019-11-04 LAB — PHOSPHORUS: Phosphorus: 3.2 mg/dL (ref 2.5–4.6)

## 2019-11-04 LAB — PROTIME-INR
INR: 1.2 (ref 0.8–1.2)
Prothrombin Time: 15.2 seconds (ref 11.4–15.2)

## 2019-11-04 MED ORDER — HEPARIN (PORCINE) 25000 UT/250ML-% IV SOLN
2950.0000 [IU]/h | INTRAVENOUS | Status: DC
  Administered 2019-11-04 – 2019-11-05 (×2): 2950 [IU]/h via INTRAVENOUS
  Filled 2019-11-04 (×3): qty 250

## 2019-11-04 NOTE — Progress Notes (Signed)
Per MD order, pt needs bariatric chair. RN called to request but none are available in stock.

## 2019-11-04 NOTE — Progress Notes (Signed)
Pt continues to refuse CPAP QHS.  RT to monitor and assess as needed.  

## 2019-11-04 NOTE — Progress Notes (Signed)
Rehab Admissions Coordinator Note:  Patient was screened by Cleatrice Burke for appropriateness for an Inpatient Acute Rehab Consult per change in PT recommendation. Noted pt lives with sister who works. Sw has received insurance authorization for SNF. It is unlikely that Health team would consider [facility] admit for this diagnosis therefore I will recommend SNF at this time.  Cleatrice Burke RN MSN 11/04/2019, 2:52 PM  I can be reached at [phone].

## 2019-11-04 NOTE — Progress Notes (Addendum)
Referring Physician(s): Armandina Gemma  Supervising Physician: Markus Daft  Patient Status:  [facility] - In-pt  Chief Complaint: "Abdominal pain"  Subjective:  Multiple intra-abdominal fluid collections s/p RLQ and two LLQ drain placements in IR 10/31/2019 by Dr. Anselm Pancoast; s/p removal of one of the LLQ drains 11/03/2019 (patient currently has 2 IR drains). Patient awake and alert laying in bed. Complains of lower abdominal pain. RLQ and LLQ drain sites c/d/i.   Allergies: Cafergot, Glucophage [metformin hcl], and Canagliflozin  Medications: Prior to Admission medications   Medication Sig Start Date End Date Taking? Authorizing Provider  acetaminophen (TYLENOL) 500 MG tablet Take 1,000 mg by mouth every 6 (six) hours as needed for moderate pain.   Yes [provider]  albuterol (ACCUNEB) 1.25 MG/3ML nebulizer solution Inhale 1 ampule into the lungs 3 (three) times daily as needed for wheezing or shortness of breath.  12/12/10  Yes [provider]  albuterol (VENTOLIN HFA) 108 (90 Base) MCG/ACT inhaler Inhale 2 puffs into the lungs every 4 (four) hours as needed for wheezing or shortness of breath.  01/04/14  Yes [provider]  amoxicillin-clavulanate (AUGMENTIN) 875-125 MG tablet Take 1 tablet by mouth 2 (two) times daily. 10/25/19  Yes [provider]  apixaban (ELIQUIS) 5 MG TABS tablet Take two tabs twice a day for 7 days, then take one tab twice a day for a year. Patient taking differently: Take 2.5 mg by mouth 2 (two) times daily.  03/24/17  Yes Florencia Reasons, MD  aspirin EC 81 MG tablet Take 81 mg by mouth daily.   Yes [provider]  atorvastatin (LIPITOR) 40 MG tablet Take 40 mg by mouth daily.   Yes [provider]  buPROPion (WELLBUTRIN XL) 150 MG 24 hr tablet Take 150 mg by mouth every morning. 09/13/19  Yes [provider]  carisoprodol (SOMA) 350 MG tablet TAKE 1 TABLET (350 MG TOTAL) BY MOUTH 3 (THREE) TIMES DAILY. 09/15/19   Yes Meredith Staggers, MD  Cholecalciferol (VITAMIN D3) 2000 units capsule Take 2,000 Units by mouth daily.    Yes [provider]  Fluticasone-Salmeterol (ADVAIR) 500-50 MCG/DOSE AEPB Inhale 1 puff into the lungs every 12 (twelve) hours.   Yes [provider]  insulin aspart (NOVOLOG FLEXPEN) 100 UNIT/ML FlexPen Inject 15 Units into the skin 3 (three) times daily with meals. Patient taking differently: Inject 44 Units into the skin 3 (three) times daily with meals.  03/24/17  Yes Florencia Reasons, MD  insulin glargine (LANTUS) 100 UNIT/ML injection Inject 80 Units into the skin at bedtime.    Yes [provider]  lisinopril (PRINIVIL,ZESTRIL) 20 MG tablet Take 1 tablet (20 mg total) by mouth daily. 03/24/17 10/30/19 Yes Florencia Reasons, MD  montelukast (SINGULAIR) 10 MG tablet Take 10 mg by mouth at bedtime.    Yes [provider]  oxybutynin (DITROPAN) 5 MG tablet Take 5 mg by mouth 2 (two) times daily.  03/13/16  Yes [provider]  Oxycodone HCl 10 MG TABS Take 1 tablet (10 mg total) by mouth every 8 (eight) hours as needed. Patient taking differently: Take 10 mg by mouth every 8 (eight) hours as needed (pain).  08/31/19  Yes Bayard Hugger, NP  promethazine (PHENERGAN) 25 MG tablet Take 25 mg by mouth every 6 (six) hours as needed for nausea or vomiting.    Yes [provider]  senna-docusate (SENOKOT-S) 8.6-50 MG tablet Take 1 tablet by mouth at bedtime. 03/24/17  Yes  Florencia Reasons, MD  sertraline (ZOLOFT) 100 MG tablet Take 200 mg by mouth daily.    Yes [provider]  BD PEN NEEDLE NANO U/F 32G X 4 MM MISC 2 (two) times daily. as directed 03/13/16   [provider]  nystatin cream (MYCOSTATIN) Apply 1 application topically 2 (two) times daily. Patient not taking: Reported on 10/31/2019 03/11/18   Huel Cote, NP     Vital Signs: BP (!) 156/71 (BP Location: Right Arm)   Pulse 78   Temp 98.6 F (37 C) (Oral)   Resp 20   Ht 5\' 8"  (1.727 m)    Wt (!) 387 lb 9.1 oz (175.8 kg)   SpO2 95%   BMI 58.93 kg/m   Physical Exam Vitals and nursing note reviewed.  Constitutional:      General: She is not in acute distress.    Appearance: Normal appearance.  Pulmonary:     Effort: Pulmonary effort is normal. No respiratory distress.  Abdominal:     Comments: RLQ and LLQ drain sites without tenderness, erythema, drainage, or active bleeding; RLQ drain with approximately 25 cc purulent fluid in suction bulb; LLQ drain stable with approximately 10 cc purulent fluid in suction bulb; both drains flush/aspirate without resistance.  Skin:    General: Skin is warm and dry.  Neurological:     Mental Status: She is alert and oriented to person, place, and time.     Imaging: DG CHEST PORT 1 VIEW  Result Date: 11/02/2019 CLINICAL DATA:  54 year old female with dyspnea EXAM: PORTABLE CHEST 1 VIEW COMPARISON:  Chest radiograph dated 07/03/2018. FINDINGS: Shallow inspiration. No focal consolidation, pleural effusion, pneumothorax. Mild cardiomegaly. No acute osseous pathology. IMPRESSION: 1. No acute cardiopulmonary process. 2. Stable mild cardiomegaly. Electronically Signed   By: Anner Crete M.D.   On: 11/02/2019 20:50   CT IMAGE GUIDED DRAINAGE BY PERCUTANEOUS CATHETER  Result Date: 10/31/2019 INDICATION: 54 year old with intra-abdominal fluid collections and suspect colonic diverticular abscesses. EXAM: CT GUIDED DRAINAGE OF INTRA-ABDOMINAL ABSCESS X 3 MEDICATIONS: Moderate sedation ANESTHESIA/SEDATION: 4.0 mg IV Versed 200 mcg IV Fentanyl Moderate Sedation Time:  94 minutes The patient was continuously monitored during the procedure by the interventional radiology nurse under my direct supervision. COMPLICATIONS: None immediate. TECHNIQUE: The procedure was explained to the patient. The risks and benefits of the procedure were discussed and the patient's questions were addressed. Informed consent was obtained from the patient. Patient was  placed supine on the CT scanner and CT images through the lower abdomen and pelvis were obtained. PROCEDURE: Collections in the left lower abdomen were identified and targeted. Left side of the abdomen was prepped with chlorhexidine and sterile field was created. Skin and soft tissues were anesthetized with 1% lidocaine. Using CT guidance, an 18 gauge trocar needle was directed into the most lateral component of the left lower abdominal collection. Yellow purulent fluid was aspirated. Stiff Amplatz wire was placed and the tract was dilated to accommodate a 12 [place] multipurpose drain. Approximately 25 mL of yellow purulent fluid was removed from this collection. However, this collection appears to be separate from the adjacent air-fluid collection. This catheter was sutured to skin. Attention was directed to the left lower abdominal abscess containing gas that is medial to the recently placed drain. Skin was anesthetized more medial and an 18 gauge needle was directed into the air-fluid collection using CT guidance. Brown purulent fluid was aspirated. Stiff Amplatz wire was advanced into the collection and the tract was dilated  to accommodate a 10.2 [place] multipurpose drain. Approximately 60 mL of brown purulent fluid was removed from this collection. Air was also aspirated from this collection. Catheter was sutured to skin. Attention was directed to the large collection in the right lower abdomen. The right side of the abdomen was prepped and draped in sterile fashion. Skin was anesthetized with 1% lidocaine. Using CT guidance, 18 gauge trocar needle was directed into the right lower abdominal collection. Cloudy yellow fluid was removed. Stiff Amplatz wire was advanced into the collection and the tract was dilated to accommodate a 12 [place] multipurpose drain. Approximately 530 mL mL cloudy yellow fluid was removed from the right lower abdominal drain. Catheter was sutured to skin. Dressings were placed on all  the drains. FINDINGS: Complex air-fluid collection in the left lower abdomen adjacent to the sigmoid colon. Large air-fluid collection in the right lower abdomen that extends to the midline. Drain #1 was placed in the lateral component of the left lower abdominal collection. This collection may be associated with the left adnexa or ovary. Although 25 mL of purulent fluid was removed from the lateral component, this collection did not appear to be contiguous with the adjacent air-fluid collection. Therefore, Drain #2 was placed within the air-fluid collection in the left lower abdominal collection. 60 mL of brown purulent fluid was removed from this drain. Large amount of cloudy yellow fluid was removed from Drain #3 in the right lower abdomen. IMPRESSION: Successful placement of 3 drains within the lower abdominal abscess collections using CT guidance. Electronically Signed   By: Markus Daft M.D.   On: 10/31/2019 17:34   CT IMAGE GUIDED DRAINAGE BY PERCUTANEOUS CATHETER  Result Date: 10/31/2019 INDICATION: 54 year old with intra-abdominal fluid collections and suspect colonic diverticular abscesses. EXAM: CT GUIDED DRAINAGE OF INTRA-ABDOMINAL ABSCESS X 3 MEDICATIONS: Moderate sedation ANESTHESIA/SEDATION: 4.0 mg IV Versed 200 mcg IV Fentanyl Moderate Sedation Time:  94 minutes The patient was continuously monitored during the procedure by the interventional radiology nurse under my direct supervision. COMPLICATIONS: None immediate. TECHNIQUE: The procedure was explained to the patient. The risks and benefits of the procedure were discussed and the patient's questions were addressed. Informed consent was obtained from the patient. Patient was placed supine on the CT scanner and CT images through the lower abdomen and pelvis were obtained. PROCEDURE: Collections in the left lower abdomen were identified and targeted. Left side of the abdomen was prepped with chlorhexidine and sterile field was created. Skin and  soft tissues were anesthetized with 1% lidocaine. Using CT guidance, an 18 gauge trocar needle was directed into the most lateral component of the left lower abdominal collection. Yellow purulent fluid was aspirated. Stiff Amplatz wire was placed and the tract was dilated to accommodate a 12 [place] multipurpose drain. Approximately 25 mL of yellow purulent fluid was removed from this collection. However, this collection appears to be separate from the adjacent air-fluid collection. This catheter was sutured to skin. Attention was directed to the left lower abdominal abscess containing gas that is medial to the recently placed drain. Skin was anesthetized more medial and an 18 gauge needle was directed into the air-fluid collection using CT guidance. Brown purulent fluid was aspirated. Stiff Amplatz wire was advanced into the collection and the tract was dilated to accommodate a 10.2 [place] multipurpose drain. Approximately 60 mL of brown purulent fluid was removed from this collection. Air was also aspirated from this collection. Catheter was sutured to skin. Attention was directed to the large collection  in the right lower abdomen. The right side of the abdomen was prepped and draped in sterile fashion. Skin was anesthetized with 1% lidocaine. Using CT guidance, 18 gauge trocar needle was directed into the right lower abdominal collection. Cloudy yellow fluid was removed. Stiff Amplatz wire was advanced into the collection and the tract was dilated to accommodate a 12 [place] multipurpose drain. Approximately 530 mL mL cloudy yellow fluid was removed from the right lower abdominal drain. Catheter was sutured to skin. Dressings were placed on all the drains. FINDINGS: Complex air-fluid collection in the left lower abdomen adjacent to the sigmoid colon. Large air-fluid collection in the right lower abdomen that extends to the midline. Drain #1 was placed in the lateral component of the left lower abdominal collection.  This collection may be associated with the left adnexa or ovary. Although 25 mL of purulent fluid was removed from the lateral component, this collection did not appear to be contiguous with the adjacent air-fluid collection. Therefore, Drain #2 was placed within the air-fluid collection in the left lower abdominal collection. 60 mL of brown purulent fluid was removed from this drain. Large amount of cloudy yellow fluid was removed from Drain #3 in the right lower abdomen. IMPRESSION: Successful placement of 3 drains within the lower abdominal abscess collections using CT guidance. Electronically Signed   By: Markus Daft M.D.   On: 10/31/2019 17:34   CT IMAGE GUIDED DRAINAGE BY PERCUTANEOUS CATHETER  Result Date: 10/31/2019 INDICATION: 54 year old with intra-abdominal fluid collections and suspect colonic diverticular abscesses. EXAM: CT GUIDED DRAINAGE OF INTRA-ABDOMINAL ABSCESS X 3 MEDICATIONS: Moderate sedation ANESTHESIA/SEDATION: 4.0 mg IV Versed 200 mcg IV Fentanyl Moderate Sedation Time:  94 minutes The patient was continuously monitored during the procedure by the interventional radiology nurse under my direct supervision. COMPLICATIONS: None immediate. TECHNIQUE: The procedure was explained to the patient. The risks and benefits of the procedure were discussed and the patient's questions were addressed. Informed consent was obtained from the patient. Patient was placed supine on the CT scanner and CT images through the lower abdomen and pelvis were obtained. PROCEDURE: Collections in the left lower abdomen were identified and targeted. Left side of the abdomen was prepped with chlorhexidine and sterile field was created. Skin and soft tissues were anesthetized with 1% lidocaine. Using CT guidance, an 18 gauge trocar needle was directed into the most lateral component of the left lower abdominal collection. Yellow purulent fluid was aspirated. Stiff Amplatz wire was placed and the tract was dilated to  accommodate a 12 [place] multipurpose drain. Approximately 25 mL of yellow purulent fluid was removed from this collection. However, this collection appears to be separate from the adjacent air-fluid collection. This catheter was sutured to skin. Attention was directed to the left lower abdominal abscess containing gas that is medial to the recently placed drain. Skin was anesthetized more medial and an 18 gauge needle was directed into the air-fluid collection using CT guidance. Brown purulent fluid was aspirated. Stiff Amplatz wire was advanced into the collection and the tract was dilated to accommodate a 10.2 [place] multipurpose drain. Approximately 60 mL of brown purulent fluid was removed from this collection. Air was also aspirated from this collection. Catheter was sutured to skin. Attention was directed to the large collection in the right lower abdomen. The right side of the abdomen was prepped and draped in sterile fashion. Skin was anesthetized with 1% lidocaine. Using CT guidance, 18 gauge trocar needle was directed into the right lower abdominal  collection. Cloudy yellow fluid was removed. Stiff Amplatz wire was advanced into the collection and the tract was dilated to accommodate a 12 [place] multipurpose drain. Approximately 530 mL mL cloudy yellow fluid was removed from the right lower abdominal drain. Catheter was sutured to skin. Dressings were placed on all the drains. FINDINGS: Complex air-fluid collection in the left lower abdomen adjacent to the sigmoid colon. Large air-fluid collection in the right lower abdomen that extends to the midline. Drain #1 was placed in the lateral component of the left lower abdominal collection. This collection may be associated with the left adnexa or ovary. Although 25 mL of purulent fluid was removed from the lateral component, this collection did not appear to be contiguous with the adjacent air-fluid collection. Therefore, Drain #2 was placed within the  air-fluid collection in the left lower abdominal collection. 60 mL of brown purulent fluid was removed from this drain. Large amount of cloudy yellow fluid was removed from Drain #3 in the right lower abdomen. IMPRESSION: Successful placement of 3 drains within the lower abdominal abscess collections using CT guidance. Electronically Signed   By: Markus Daft M.D.   On: 10/31/2019 17:34    Labs:  CBC: Recent Labs    11/01/19 0526 11/02/19 0503 11/03/19 0618 11/04/19 0746  WBC 21.5* 25.4* 20.8* 18.9*  HGB 9.6* 9.8* 9.3* 9.5*  HCT 32.0* 32.8* 31.4* 32.1*  PLT 317 352 309 314    COAGS: Recent Labs    11/01/19 0526 11/02/19 0503 11/03/19 0618 11/04/19 0746  INR 1.4* 1.3* 1.3* 1.2    BMP: Recent Labs    11/01/19 0526 11/02/19 0503 11/03/19 0618 11/04/19 0746  NA 138 136 135 135  135  K 4.1 4.3 4.3 4.3  4.2  CL 103 105 105 104  104  CO2 22 22 22 24  24   GLUCOSE 162* 115* 94 130*  131*  BUN 44* 36* 28* 21*  21*  CALCIUM 8.1* 8.3* 8.2* 8.3*  8.2*  CREATININE 1.98* 1.56* 1.28* 1.04*  0.92  GFRNONAA 28* 38* 48* >60  >60  GFRAA 33* 44* 55* >60  >60    LIVER FUNCTION TESTS: Recent Labs    11/01/19 0526 11/02/19 0503 11/03/19 0618 11/04/19 0746  BILITOT 0.6 0.7 0.3 0.4  AST 23 54* 31 25  ALT 25 43 33 34  ALKPHOS 83 178* 201* 274*  PROT 6.1* 6.4* 6.1* 6.1*  ALBUMIN 2.2* 2.1* 1.9* 1.9*    Assessment and Plan:  Multiple intra-abdominal fluid collections s/p RLQ and two LLQ drain placements in IR 10/31/2019 by Dr. Anselm Pancoast; s/p removal of one of the LLQ drains 11/03/2019 (patient currently has 2 IR drains). RLQ drain stable with approximately 25 cc purulent fluid in suction bulb (additional 130 cc output in past 24 hours per chart). LLQ drain stable with approximately 10 cc purulent fluid (additional 10 cc output in past 24 hours per chart). Continue current drain management- continue with Qshift flushes/monitor of output. Plan for repeat CT/possible drain  injection when output <10 cc/day (assess for possible removal). Further plans per [facility]S- appreciate and agree with management. IR to follow.   Electronically Signed: Earley Abide, PA-C 11/04/2019, 12:37 PM   I spent a total of 25 Minutes at the the patient's bedside AND on the patient's hospital floor or unit, greater than 50% of which was counseling/coordinating care for intraabdominal fluid collections s/p drain placements x3.

## 2019-11-04 NOTE — Progress Notes (Signed)
ANTICOAGULATION CONSULT NOTE - follow up  Pharmacy Consult for Heparin Indication: H/O PE (on Eliquis PTA) - bridge therapy until oral anticoagulation can resume  Allergies  Allergen Reactions  . Cafergot Other (See Comments)    Chest pain; ergotamine-caffiene  . Glucophage [Metformin Hcl] Anaphylaxis  . Canagliflozin Swelling and Other (See Comments)    Legs Swell invokana Legs Swell    Patient Measurements: Height: 5\' 8"  (172.7 cm) Weight: (!) 387 lb 9.1 oz (175.8 kg) IBW/kg (Calculated) : 63.9 Heparin Dosing Weight: 108 kg  Vital Signs: Temp: 99.1 F (37.3 C) (01/14 1449) Temp Source: Oral (01/14 1449) BP: 148/67 (01/14 1449) Pulse Rate: 79 (01/14 1449)  Labs: Recent Labs    11/02/19 0503 11/02/19 0503 11/03/19 0618 11/03/19 1451 11/03/19 2300 11/04/19 0746 11/04/19 1547  HGB 9.8*   < > 9.3*  --   --  9.5*  --   HCT 32.8*  --  31.4*  --   --  32.1*  --   PLT 352  --  309  --   --  314  --   LABPROT 16.0*  --  15.6*  --   --  15.2  --   INR 1.3*  --  1.3*  --   --  1.2  --   HEPARINUNFRC  --   --  0.22*   < > 0.20* 0.28* 0.32  CREATININE 1.56*  --  1.28*  --   --  1.04*  0.92  --    < > = values in this interval not displayed.    Estimated Creatinine Clearance: 107.3 mL/min (A) (by C-G formula based on SCr of 1.04 mg/dL (H)).   Assessment:  24yr female admitted on 10/30/19 with performated sigmoid diverticulitis with abscess.  Patient on Eliquis PTA for h/o PE.  Eliquis held on admission pending potential procedures.  Abscess drains placed 1/10.    Pharmacy consulted to begin IV heparin without bolus for anticoagulation until cleared by general surgery for oral anticoagulation  Today, 11/04/19  Heparin level therapeutic at 0.32 after heparin drip increased to 2850 units/hr from 26500 units/hr  CBC stable  No reported bleeding or infusion problems noted per RN  Goal of Therapy:  Heparin level 0.3-0.7 units/ml Monitor platelets by anticoagulation  protocol: Yes   Plan:   Per MD's instruction, no heparin bolus.   Increase IV heparin from 2850 units/hr to 2950 units/hr  Check heparin level and CBC daily, DC daily INR  F/u ability to transition back to PTA Eliquis  Eudelia Bunch, Pharm.D [phone] 11/04/2019 5:28 PM

## 2019-11-04 NOTE — Progress Notes (Signed)
PROGRESS NOTE    Kristen [name]  WNU:[id number] DOB: 27-Nov-1965 DOA: 10/30/2019 PCP: Lawerance Cruel, MD    Brief Narrative: 54 year old Caucasian female with PMH of morbid obesity, type 2 diabetes mellitus, chronic pain on opioids, hypertension, depression, anxiety, asthma, s/p cholecystectomy, CVA with residual vision loss, PCOS, sleep apnea who was brought to the ED by EMS from home for worsening abdominal painassociated with diarrhea. Recently treated for UTI with no improvement in abd pain.[place] home and largely immobile. Reported chronicdepressionand sometimes has suicidal ideations but none now. ED Course: Afebrile,noted to be hypertensive, tachycardic.Found to have leukocytosis of 26K and blood sugar elevated in the 300s. CT abdomen pelvis without contrast (no contrast due to renal dysfunction)showed perforated sigmoid diverticulitis with a large 12 cm abscess in addition to complex fluid collections extending into the patient's pelvis. ED provider spoke to surgeon on call Dr. Gala Lewandowsky who indicated no surgical need at this time and recommended IR consult for drainage of the abscess. Hospital course: [facility] and IR consulted. Patient underwent CT guided drain x3 placement on 1/10.   Assessment & Plan:   Principal Problem:   Diverticulitis of large intestine with abscess Active Problems:   Essential hypertension   Asthma   Obesity, Class III, BMI 40-49.9 (morbid obesity) (HCC)   Type 2 diabetes mellitus with stage 3 chronic kidney disease (HCC)   Diverticulitis   Abscess of sigmoid colon   History of pulmonary embolus (PE)   AKI (acute kidney injury) ([place])   Hyponatremia   Hyperglycemia  #1 perforated sigmoid diverticulitis with complicated large abscess 12 cm-she has 2 drains in place.  One of the drains were taken out by IR 11/03/2019. C. difficile negative On Zosyn Continue Dilaudid for pain control, she still believes she needs a  Dilaudid for intense pain. Drains placed by IR Followed by surgery WBC 18.9 down from 20.8 yesterday. Advance diet per general surgery. Discussed with the nurse to get her a bigger chair  #2 AKI on CKD stage III A- creatinine 1.86 on admission peaked to 2.2 and trending down. She received IV fluids.  Creatinine 1.04 down from yesterday.  Continue to hold ACE inhibitor.   #3 type 2 diabetes with hyperglycemia on Lantus 60 units at bedtime with sliding scale insulin. CBG (last 3)  Recent Labs    11/04/19 0427 11/04/19 0806 11/04/19 1135  GLUCAP 121* 113* 160*     #4 essential hypertension -blood pressure 156/71 likely increased secondary to pain.  Will follow closely and start antihypertensives if needed.  #5 history of asthma stable continue inhalers and encourage incentive spirometry.  Continue Singulair and Dulera.  #6 morbid obesity needs ongoing counseling regarding weight loss  #7 depression continue home meds Zoloft 200 mg daily.  #8 pyuria with rare bacteriuria initially treated for UTI.      Nutrition Problem: Increased nutrient needs Etiology: acute illness     Signs/Symptoms: estimated needs    Interventions: Prostat(Ensure Max)  Estimated body mass index is 58.93 kg/m as calculated from the following:   Height as of this encounter: 5\' 8"  (1.727 m).   Weight as of this encounter: 175.8 kg.  DVT prophylaxis: Heparin Code Status: Full code discussed with patient Family Communication: Discussed with patient  disposition Plan pending further clinical improvement patient still on clear liquids and has 2 drains in place followed by IR and surgery and need for SNF bed  Consultants:   IR and general surgery   Procedures:  3 abdominal drains placed by IR  Antimicrobials Zosyn  Subjective: She reports having a bad night requesting a bigger chair to sit up so her legs 1 heart she want to sit up for longer duration Diet was advanced to full  liquids.  She took some Ensure and she feels that has made the stomach pain worse and she wants to stay on full liquids  Objective: Vitals:   11/03/19 2022 11/03/19 2048 11/04/19 0431 11/04/19 0844  BP: (!) 154/68  (!) 156/71   Pulse: 76  78   Resp: 18  20   Temp: 98.7 F (37.1 C)  98.6 F (37 C)   TempSrc: Oral  Oral   SpO2: 99% 96% 97% 95%  Weight:      Height:        Intake/Output Summary (Last 24 hours) at 11/04/2019 1249 Last data filed at 11/04/2019 0948 Gross per 24 hour  Intake 1223.67 ml  Output 92 ml  Net 1131.67 ml   Filed Weights   10/31/19 0200  Weight: (!) 175.8 kg    Examination:  General exam: Appears calm and comfortable  Respiratory system: Clear to auscultation. Respiratory effort normal. Cardiovascular system: S1 & S2 heard, RRR. No JVD, murmurs, rubs, gallops or clicks. No pedal edema. Gastrointestinal system: Abdomen is nondistended, soft and nontender. No organomegaly or masses felt. Normal bowel sounds heard.  2 drains in place ones on the left and one on the right side of the abdomen. Central nervous system: Alert and oriented. No focal neurological deficits. Extremities: Symmetric 5 x 5 power. Skin: No rashes, lesions or ulcers Psychiatry: Judgement and insight appear normal. Mood & affect appropriate.     Data Reviewed: I have personally reviewed following labs and imaging studies  CBC: Recent Labs  Lab 10/31/19 0558 11/01/19 0526 11/02/19 0503 11/03/19 0618 11/04/19 0746  WBC 18.0* 21.5* 25.4* 20.8* 18.9*  HGB 9.8* 9.6* 9.8* 9.3* 9.5*  HCT 32.4* 32.0* 32.8* 31.4* 32.1*  MCV 87.1 88.2 86.8 88.0 87.5  PLT 356 317 352 309 476   Basic Metabolic Panel: Recent Labs  Lab 10/31/19 0558 11/01/19 0526 11/02/19 0503 11/03/19 0618 11/04/19 0746  NA 133* 138 136 135 135  135  K 4.7 4.1 4.3 4.3 4.3  4.2  CL 101 103 105 105 104  104  CO2 22 22 22 22 24  24   GLUCOSE 307* 162* 115* 94 130*  131*  BUN 36* 44* 36* 28* 21*  21*    CREATININE 2.21* 1.98* 1.56* 1.28* 1.04*  0.92  CALCIUM 8.3* 8.1* 8.3* 8.2* 8.3*  8.2*  MG  --  1.7 1.8 1.9 1.7  PHOS  --  4.2 4.4 4.2 3.2   GFR: Estimated Creatinine Clearance: 107.3 mL/min (A) (by C-G formula based on SCr of 1.04 mg/dL (H)). Liver Function Tests: Recent Labs  Lab 10/30/19 1748 11/01/19 0526 11/02/19 0503 11/03/19 0618 11/04/19 0746  AST 20 23 54* 31 25  ALT 39 25 43 33 34  ALKPHOS 97 83 178* 201* 274*  BILITOT 1.2 0.6 0.7 0.3 0.4  PROT 7.5 6.1* 6.4* 6.1* 6.1*  ALBUMIN 3.1* 2.2* 2.1* 1.9* 1.9*   Recent Labs  Lab 10/30/19 1748  LIPASE 23   No results for input(s): AMMONIA in the last 168 hours. Coagulation Profile: Recent Labs  Lab 10/31/19 1056 11/01/19 0526 11/02/19 0503 11/03/19 0618 11/04/19 0746  INR 1.4* 1.4* 1.3* 1.3* 1.2   Cardiac Enzymes: No results for input(s): CKTOTAL, CKMB, CKMBINDEX, TROPONINI in  the last 168 hours. BNP (last 3 results) No results for input(s): PROBNP in the last 8760 hours. HbA1C: No results for input(s): HGBA1C in the last 72 hours. CBG: Recent Labs  Lab 11/03/19 2018 11/03/19 2349 11/04/19 0427 11/04/19 0806 11/04/19 1135  GLUCAP 206* 157* 121* 113* 160*   Lipid Profile: No results for input(s): CHOL, HDL, LDLCALC, TRIG, CHOLHDL, LDLDIRECT in the last 72 hours. Thyroid Function Tests: No results for input(s): TSH, T4TOTAL, FREET4, T3FREE, THYROIDAB in the last 72 hours. Anemia Panel: No results for input(s): VITAMINB12, FOLATE, FERRITIN, TIBC, IRON, RETICCTPCT in the last 72 hours. Sepsis Labs: Recent Labs  Lab 10/30/19 1916 10/31/19 0558  LATICACIDVEN 1.3 1.4    Recent Results (from the past 240 hour(s))  Respiratory Panel by RT PCR (Flu A&B, Covid) - Nasopharyngeal Swab     Status: None   Collection Time: 10/30/19  7:25 PM   Specimen: Nasopharyngeal Swab  Result Value Ref Range Status   SARS Coronavirus 2 by RT PCR NEGATIVE NEGATIVE Final    Comment: (NOTE) SARS-CoV-2 target nucleic  acids are NOT DETECTED. The SARS-CoV-2 RNA is generally detectable in upper respiratoy specimens during the acute phase of infection. The lowest concentration of SARS-CoV-2 viral copies this assay can detect is 131 copies/mL. A negative result does not preclude SARS-Cov-2 infection and should not be used as the sole basis for treatment or other patient management decisions. A negative result may occur with  improper specimen collection/handling, submission of specimen other than nasopharyngeal swab, presence of viral mutation(s) within the areas targeted by this assay, and inadequate number of viral copies (<131 copies/mL). A negative result must be combined with clinical observations, patient history, and epidemiological information. The expected result is Negative. Fact Sheet for Patients:  PinkCheek.be Fact Sheet for Healthcare Providers:  GravelBags.it This test is not yet ap proved or cleared by the [place] FDA and  has been authorized for detection and/or diagnosis of SARS-CoV-2 by FDA under an Emergency Use Authorization (EUA). This EUA will remain  in effect (meaning this test can be used) for the duration of the COVID-19 declaration under Section 564(b)(1) of the Act, 21 U.S.C. section 360bbb-3(b)(1), unless the authorization is terminated or revoked sooner.    Influenza A by PCR NEGATIVE NEGATIVE Final   Influenza B by PCR NEGATIVE NEGATIVE Final    Comment: (NOTE) The Xpert Xpress SARS-CoV-2/FLU/RSV assay is intended as an aid in  the diagnosis of influenza from Nasopharyngeal swab specimens and  should not be used as a sole basis for treatment. Nasal washings and  aspirates are unacceptable for Xpert Xpress SARS-CoV-2/FLU/RSV  testing. Fact Sheet for Patients: PinkCheek.be Fact Sheet for Healthcare Providers: GravelBags.it This test is not yet  approved or cleared by the [place] FDA and  has been authorized for detection and/or diagnosis of SARS-CoV-2 by  FDA under an Emergency Use Authorization (EUA). This EUA will remain  in effect (meaning this test can be used) for the duration of the  Covid-19 declaration under Section 564(b)(1) of the Act, 21  U.S.C. section 360bbb-3(b)(1), unless the authorization is  terminated or revoked. Performed at [facility], [place] [address], [place], [place] [address]   Culture, blood (routine x 2)     Status: None (Preliminary result)   Collection Time: 10/30/19  8:13 PM   Specimen: BLOOD  Result Value Ref Range Status   Specimen Description   Final    BLOOD LEFT HAND Performed at [name], [place] [address], [place], [place] [address]    Special Requests   Final    BOTTLES DRAWN AEROBIC AND ANAEROBIC Blood Culture adequate volume Performed at [place] [address], [place], [place] [address]    Culture   Final    NO GROWTH 4 DAYS Performed at [facility], [place] [address], [place], [place] [address]    Report Status PENDING  Incomplete  Culture, blood (routine x 2)     Status: None (Preliminary result)   Collection Time: 10/30/19 11:00 PM   Specimen: BLOOD RIGHT HAND  Result Value Ref Range Status   Specimen Description   Final    BLOOD RIGHT HAND Performed at [place] [address], [place], [place] [address]    Special Requests   Final    BOTTLES DRAWN AEROBIC AND ANAEROBIC Blood Culture adequate volume Performed at [place] [address], [place], [place] [address]    Culture   Final    NO GROWTH 4 DAYS Performed at [facility], [place] [address], [place], [place] [address]    Report Status PENDING  Incomplete  C difficile quick scan w PCR reflex     Status: None   Collection Time: 10/31/19  8:16 AM   Specimen: STOOL  Result Value Ref Range  Status   C Diff antigen NEGATIVE NEGATIVE Final   C Diff toxin NEGATIVE NEGATIVE Final   C Diff interpretation No C. difficile detected.  Final    Comment: Performed at [facility], [place] [address] [place], [place] [address]  Aerobic/Anaerobic Culture (surgical/deep wound)     Status: None   Collection Time: 10/31/19  4:11 PM   Specimen: Abscess  Result Value Ref Range Status   Specimen Description   Final    ABSCESS Performed at [place] [address], [place], [place] [address]    Special Requests   Final    1 LEFT ABDOMINAL LATERAL Performed at [facility], [place] [address], [place], [place] [address]    Gram Stain   Final    ABUNDANT WBC PRESENT, PREDOMINANTLY PMN ABUNDANT GRAM POSITIVE COCCI IN PAIRS IN CLUSTERS IN CHAINS FEW GRAM NEGATIVE COCCOBACILLI RARE GRAM POSITIVE RODS Performed at [facility], [place] [address], [place],  [address]    Culture   Final    MODERATE ESCHERICHIA COLI MIXED ANAEROBIC FLORA PRESENT.  CALL LAB IF FURTHER IID REQUIRED.    Report Status 11/04/2019 FINAL  Final   Organism ID, Bacteria ESCHERICHIA COLI  Final      Susceptibility   Escherichia coli - MIC*    AMPICILLIN >=32 RESISTANT Resistant     CEFAZOLIN <=4 SENSITIVE Sensitive     CEFEPIME <=0.12 SENSITIVE Sensitive     CEFTAZIDIME <=1 SENSITIVE Sensitive     CEFTRIAXONE <=0.25 SENSITIVE Sensitive     CIPROFLOXACIN >=4 RESISTANT Resistant     GENTAMICIN >=16 RESISTANT Resistant     IMIPENEM <=0.25 SENSITIVE Sensitive     TRIMETH/SULFA >=320 RESISTANT Resistant     AMPICILLIN/SULBACTAM >=32 RESISTANT Resistant     PIP/TAZO <=4 SENSITIVE Sensitive     * MODERATE ESCHERICHIA COLI  Aerobic/Anaerobic Culture (surgical/deep wound)     Status: None (Preliminary result)   Collection Time: 10/31/19  4:19 PM   Specimen: Abscess  Result Value Ref Range Status   Specimen Description   Final    ABSCESS Performed  at  [facility], [place] [address] [place], [place] [address]    Special Requests   Final    2 LEFT ABDOMEN MEDIAL Performed at [facility], [place] [address] [place], [place] [address]    Gram Stain   Final    FEW WBC PRESENT,BOTH PMN AND MONONUCLEAR ABUNDANT GRAM POSITIVE COCCI IN PAIRS IN CHAINS RARE GRAM POSITIVE RODS RARE GRAM NEGATIVE RODS    Culture   Final    ABUNDANT ACTINOMYCES NEUII Standardized susceptibility testing for this organism is not available. Performed at [facility], [place] [address] [place], [place] [address]    Report Status PENDING  Incomplete         Radiology Studies: DG CHEST PORT 1 VIEW  Result Date: 11/02/2019 CLINICAL DATA:  54 year old female with dyspnea EXAM: PORTABLE CHEST 1 VIEW COMPARISON:  Chest radiograph dated 07/03/2018. FINDINGS: Shallow inspiration. No focal consolidation, pleural effusion, pneumothorax. Mild cardiomegaly. No acute osseous pathology. IMPRESSION: 1. No acute cardiopulmonary process. 2. Stable mild cardiomegaly. Electronically Signed   By: Anner Crete M.D.   On: 11/02/2019 20:50        Scheduled Meds: . atorvastatin  40 mg Oral Daily  . cholecalciferol  2,000 Units Oral Daily  . feeding supplement (PRO-STAT SUGAR FREE 64)  30 mL Oral BID  . insulin aspart  0-20 Units Subcutaneous Q4H  . insulin aspart  10 Units Subcutaneous TID WC  . insulin glargine  60 Units Subcutaneous QHS  . mometasone-formoterol  2 puff Inhalation BID  . montelukast  10 mg Oral QHS  . Ensure Max Protein  11 oz Oral BID  . saccharomyces boulardii  250 mg Oral BID  . sertraline  200 mg Oral Daily  . sodium chloride flush  3 mL Intravenous Q12H  . sodium chloride flush  5 mL Intracatheter Q8H  . sodium chloride flush  5 mL Intracatheter Q8H   Continuous Infusions: . heparin 2,850 Units/hr (11/04/19 0928)  . piperacillin-tazobactam (ZOSYN)  IV 3.375 g (11/04/19 1212)     LOS: 5 days      Georgette Shell, MD [facility]  If 7PM-7AM, please contact night-coverage [email or web address] Password [facility] 11/04/2019, 12:49 PM

## 2019-11-04 NOTE — Progress Notes (Signed)
ANTICOAGULATION CONSULT NOTE - Follow Up Consult  Pharmacy Consult for Heparin Indication: H/O PE (on Eliquis PTA) - bridge therapy until oral anticoagulation can resume  Allergies  Allergen Reactions  . Cafergot Other (See Comments)    Chest pain; ergotamine-caffiene  . Glucophage [Metformin Hcl] Anaphylaxis  . Canagliflozin Swelling and Other (See Comments)    Legs Swell invokana Legs Swell    Patient Measurements: Height: 5\' 8"  (172.7 cm) Weight: (!) 387 lb 9.1 oz (175.8 kg) IBW/kg (Calculated) : 63.9 Heparin Dosing Weight:   Vital Signs: Temp: 98.7 F (37.1 C) (01/13 2022) Temp Source: Oral (01/13 2022) BP: 154/68 (01/13 2022) Pulse Rate: 76 (01/13 2022)  Labs: Recent Labs    11/01/19 0526 11/02/19 0503 11/03/19 0618 11/03/19 1451 11/03/19 2300  HGB 9.6* 9.8* 9.3*  --   --   HCT 32.0* 32.8* 31.4*  --   --   PLT 317 352 309  --   --   LABPROT 17.2* 16.0* 15.6*  --   --   INR 1.4* 1.3* 1.3*  --   --   HEPARINUNFRC  --   --  0.22* 0.24* 0.20*  CREATININE 1.98* 1.56* 1.28*  --   --     Estimated Creatinine Clearance: 87.2 mL/min (A) (by C-G formula based on SCr of 1.28 mg/dL (H)).   Medications:  Infusions:  . heparin 2,400 Units/hr (11/03/19 2049)  . piperacillin-tazobactam (ZOSYN)  IV 3.375 g (11/03/19 2101)    Assessment: Patient with low heparin level.  No heparin issues per RN.  Goal of Therapy:  Heparin level 0.3-0.7 units/ml Monitor platelets by anticoagulation protocol: Yes   Plan:  Increase heparin to 2650 units/hr Recheck level at 0800  [name] 11/04/2019,12:22 AM

## 2019-11-04 NOTE — Progress Notes (Addendum)
CC: Acute diverticulitis with abscess formation  Subjective: She had a bad night with abdominal discomfort, feeling like she had to pass gas or have a bowel movement all night.  She feels more tender and is fairly sensitive this a.m.  They did not get her out of bed yesterday.  Overall she just feels worse today.  Drain #2 was removed yesterday by IR.  Both drain #1 and #3 continue to have a cloudy clear fluid.  Patient does not want to advance her diet and does not really want the full liquids she has on her tray currently.  Objective: Vital signs in last 24 hours: Temp:  [97.5 F (36.4 C)-98.7 F (37.1 C)] 98.6 F (37 C) (01/14 0431) Pulse Rate:  [76-78] 78 (01/14 0431) Resp:  [18-20] 20 (01/14 0431) BP: (136-156)/(68-73) 156/71 (01/14 0431) SpO2:  [95 %-99 %] 95 % (01/14 0844) Last BM Date: 11/03/19 1080 p.o. 600 IV Voided x1 Drain #1 - 10 Drain #2 -25 Drain #3 - 130 Stool x1 Afebrile vital signs are stable CMP is stable glucose is well controlled. WBC down to 18.9.   Intake/Output from previous day: 01/13 0701 - 01/14 0700 In: 1583.7 [P.O.:1080; I.V.:338.6; IV Piggyback:150] Out: 167 [Urine:1; Drains:165; Stool:1] Intake/Output this shift: No intake/output data recorded.   General appearance: alert, cooperative and no distress Resp: clear to auscultation bilaterally and Anterior exam. GI: She seems more tender and sensitive this a.m.  Continues to have a lot of loose stools, passing a lot of gas and just generally more uncomfortable.  Lab Results:  Recent Labs    11/03/19 0618 11/04/19 0746  WBC 20.8* 18.9*  HGB 9.3* 9.5*  HCT 31.4* 32.1*  PLT 309 314    BMET Recent Labs    11/03/19 0618 11/04/19 0746  NA 135 135  K 4.3 4.2  CL 105 104  CO2 22 24  GLUCOSE 94 131*  BUN 28* 21*  CREATININE 1.28* 0.92  CALCIUM 8.2* 8.2*   PT/INR Recent Labs    11/03/19 0618 11/04/19 0746  LABPROT 15.6* 15.2  INR 1.3* 1.2    Recent Labs  Lab  10/30/19 1748 11/01/19 0526 11/02/19 0503 11/03/19 0618  AST 20 23 54* 31  ALT 39 25 43 33  ALKPHOS 97 83 178* 201*  BILITOT 1.2 0.6 0.7 0.3  PROT 7.5 6.1* 6.4* 6.1*  ALBUMIN 3.1* 2.2* 2.1* 1.9*     Lipase     Component Value Date/Time   LIPASE 23 10/30/2019 1748     Medications: . atorvastatin  40 mg Oral Daily  . cholecalciferol  2,000 Units Oral Daily  . feeding supplement (PRO-STAT SUGAR FREE 64)  30 mL Oral BID  . insulin aspart  0-20 Units Subcutaneous Q4H  . insulin aspart  10 Units Subcutaneous TID WC  . insulin glargine  60 Units Subcutaneous QHS  . mometasone-formoterol  2 puff Inhalation BID  . montelukast  10 mg Oral QHS  . Ensure Max Protein  11 oz Oral BID  . saccharomyces boulardii  250 mg Oral BID  . sertraline  200 mg Oral Daily  . sodium chloride flush  3 mL Intravenous Q12H  . sodium chloride flush  5 mL Intracatheter Q8H  . sodium chloride flush  5 mL Intracatheter Q8H   . heparin 2,650 Units/hr (11/04/19 0120)  . piperacillin-tazobactam (ZOSYN)  IV 3.375 g (11/04/19 0507)    Assessment/Plan C. difficile work-up for diarrhea Type 2 diabetes Hypertension Hx pulmonary embolism-chronic anticoagulation-  Eliquis Hx of CVA with vision loss Chronic back pain - oxycodone/[facility] Asthma Sleep apnea BMI 58.93 Acute on chronic kidney disease; creatinine2.21>>1.98>>1.56>>1.28 >>0.92  Acute diverticulitis with large abscess IR drain placement times 31/10/21, Dr. Markus Daft: drain #1 left lower abdominal abscess may involve left ovary and adnexa-25 mL>> 25 ml>>29 Drain #2 left lower abdominal abscess medial containing gas and removed 60 mL of brown purulent fluid>> 20 mL>>20 Drained #3 in the right lower abdomen removed 530 mL cloudy yellow fluid>>36mL>>130 WBC  21.5>>25.4>>20.8>>18.9  FEN: full liquids ID: Zosyn 1/9>>day5 DVT: SQ heparin Follow-up:TBD Dilaudid 0.5 x 2, oxycodone 10 mg x 3 Zofran x3  Plan:  Drain labeled #2 was removed yesterday.  Patient's more tender and had a bad night continue current full liquids, and IV antibiotics.  Work on getting her a bariatric bed and chair for her room.  Her original CT was 10/30/2019.  IR drains 10/31/2019.      LOS: 5 days    Kristen [name] 11/04/2019 Please see Amion

## 2019-11-04 NOTE — Progress Notes (Signed)
Physical Therapy Treatment Patient Details Name: Kristen [name] MRN: [id number] DOB: 09/09/1966 Today's Date: 11/04/2019    History of Present Illness 54 yo female admitted to ED on 1/9 with abdominal pain, found to have perforated sigmoid diverticulitis with large abscess. Pt s/p CT guided drainage of abdomino-pelvic fluid, placement of 3 JP drains. PMH includes anxiety, asthma, HTN, DM, PCOS, L optic nerve, CVA, CKD III.  Now with 2 JP drains    PT Comments    Pt making good progress with transfers today.  She was able to transfers with mod A and ambulated with min A.  Had assist of 2 for safety.  Due to independence prior and making progress - added recommendation for OT and CIR.  Pt likely able to participate more as pain is controlled.    Follow Up Recommendations  CIR;SNF     Equipment Recommendations  Other (comment)(tbd next venue)    Recommendations for Other Services Rehab consult;OT consult     Precautions / Restrictions Precautions Precautions: Fall Precaution Comments: 2 JP drains    Mobility  Bed Mobility Overal bed mobility: Needs Assistance Bed Mobility: Supine to Sit     Supine to sit: Mod assist;+2 for safety/equipment;HOB elevated     General bed mobility comments: Pt was able to get feet off of bed and then pull up on therapist hand; had assist of 2 for safety; increased time due to pain  Transfers Overall transfer level: Needs assistance Equipment used: Rolling walker (2 wheeled) Transfers: Sit to/from Stand Sit to Stand: Min assist;From elevated surface;+2 safety/equipment Stand pivot transfers: Min guard;+2 safety/equipment       General transfer comment: min A to power up for standing but then only min guard for transfers  Ambulation/Gait Ambulation/Gait assistance: Min assist;+2 safety/equipment Gait Distance (Feet): 8 Feet Assistive device: Rolling walker (2 wheeled) Gait Pattern/deviations: Step-to pattern;Step-through  pattern;Decreased stride length;Trunk flexed;Wide base of support     General Gait Details: Min assist +2 for steadying, verbal cuing for hand placement on RW when standing and sitting when arrived to recliner.   Stairs             Wheelchair Mobility    Modified Rankin (Stroke Patients Only)       Balance Overall balance assessment: Needs assistance Sitting-balance support: No upper extremity supported;Feet supported Sitting balance-Leahy Scale: Fair     Standing balance support: Bilateral upper extremity supported Standing balance-Leahy Scale: Fair                 High Level Balance Comments: Standing for toielting ADLs with RW for balance : min guard and assist with ADLs            Cognition Arousal/Alertness: Awake/alert Behavior During Therapy: WFL for tasks assessed/performed Overall Cognitive Status: Within Functional Limits for tasks assessed                                        Exercises      General Comments        Pertinent Vitals/Pain Pain Assessment: Faces Faces Pain Scale: Hurts even more Pain Location: abdomen, sitting EOB and during mobility Pain Descriptors / Indicators: Sore;Discomfort;Guarding Pain Intervention(s): Monitored during session;Limited activity within patient's tolerance;Repositioned    Home Living                      Prior Function  PT Goals (current goals can now be found in the care plan section) Progress towards PT goals: Progressing toward goals    Frequency    Min 3X/week      PT Plan Discharge plan needs to be updated;Frequency needs to be updated(Pt independent prior, demonstrating good improvement today, consider CIR)    Co-evaluation              AM-PAC PT "6 Clicks" Mobility   Outcome Measure  Help needed turning from your back to your side while in a flat bed without using bedrails?: A Lot Help needed moving from lying on your back to sitting  on the side of a flat bed without using bedrails?: A Lot Help needed moving to and from a bed to a chair (including a wheelchair)?: A Little Help needed standing up from a chair using your arms (e.g., wheelchair or bedside chair)?: A Little Help needed to walk in hospital room?: A Little Help needed climbing 3-5 steps with a railing? : Total 6 Click Score: 14    End of Session Equipment Utilized During Treatment: Gait belt Activity Tolerance: Patient tolerated treatment well Patient left: in chair;with call bell/phone within reach(feet elevated) Nurse Communication: Mobility status PT Visit Diagnosis: Other abnormalities of gait and mobility (R26.89);Difficulty in walking, not elsewhere classified (R26.2);Muscle weakness (generalized) (M62.81)     Time: 3419-6222 PT Time Calculation (min) (ACUTE ONLY): 28 min  Charges:  $Gait Training: 8-22 mins $Therapeutic Activity: 8-22 mins                     Kristen [name], PT Acute Rehab Services Pager [phone] [facility] [phone]    Kristen [name] 11/04/2019, 2:20 PM

## 2019-11-04 NOTE — Progress Notes (Signed)
ANTICOAGULATION CONSULT NOTE - follow up  Pharmacy Consult for Heparin Indication: H/O PE (on Eliquis PTA) - bridge therapy until oral anticoagulation can resume  Allergies  Allergen Reactions  . Cafergot Other (See Comments)    Chest pain; ergotamine-caffiene  . Glucophage [Metformin Hcl] Anaphylaxis  . Canagliflozin Swelling and Other (See Comments)    Legs Swell invokana Legs Swell    Patient Measurements: Height: 5\' 8"  (172.7 cm) Weight: (!) 387 lb 9.1 oz (175.8 kg) IBW/kg (Calculated) : 63.9 Heparin Dosing Weight: 108 kg  Vital Signs: Temp: 98.6 F (37 C) (01/14 0431) Temp Source: Oral (01/14 0431) BP: 156/71 (01/14 0431) Pulse Rate: 78 (01/14 0431)  Labs: Recent Labs    11/02/19 0503 11/02/19 0503 11/03/19 0618 11/03/19 0618 11/03/19 1451 11/03/19 2300 11/04/19 0746  HGB 9.8*   < > 9.3*  --   --   --  9.5*  HCT 32.8*  --  31.4*  --   --   --  32.1*  PLT 352  --  309  --   --   --  314  LABPROT 16.0*  --  15.6*  --   --   --  15.2  INR 1.3*  --  1.3*  --   --   --  1.2  HEPARINUNFRC  --   --  0.22*   < > 0.24* 0.20* 0.28*  CREATININE 1.56*  --  1.28*  --   --   --  0.92   < > = values in this interval not displayed.    Estimated Creatinine Clearance: 121.4 mL/min (by C-G formula based on SCr of 0.92 mg/dL).   Assessment:  50yr female admitted on 10/30/19 with performated sigmoid diverticulitis with abscess.  Patient on Eliquis PTA for h/o PE.  Eliquis held on admission pending potential procedures.  Abscess drains placed 1/10.    Pharmacy consulted to begin IV heparin without bolus for anticoagulation until cleared by general surgery for oral anticoagulation  Today, 11/04/19  Heparin level remains SUBtherapeutic at 0.28 after heparin drip increased to 2650 units/hr from 2400 units/hr  CBC stable  No reported bleeding or infusion problems noted per RN  Goal of Therapy:  Heparin level 0.3-0.7 units/ml Monitor platelets by anticoagulation protocol:  Yes   Plan:   Per MD's instruction, no heparin bolus.   Increase IV heparin from 2650 units/hr to 2850 units/hr  Check heparin level 6 hr after heparin increased  Check heparin level and CBC daily   Shela Commons, PharmD, BCPS 11/04/2019 9:11 AM

## 2019-11-05 LAB — CBC
HCT: 32 % — ABNORMAL LOW (ref 36.0–46.0)
Hemoglobin: 9.3 g/dL — ABNORMAL LOW (ref 12.0–15.0)
MCH: 25.4 pg — ABNORMAL LOW (ref 26.0–34.0)
MCHC: 29.1 g/dL — ABNORMAL LOW (ref 30.0–36.0)
MCV: 87.4 fL (ref 80.0–100.0)
Platelets: 337 10*3/uL (ref 150–400)
RBC: 3.66 MIL/uL — ABNORMAL LOW (ref 3.87–5.11)
RDW: 14.8 % (ref 11.5–15.5)
WBC: 21.7 10*3/uL — ABNORMAL HIGH (ref 4.0–10.5)
nRBC: 0 % (ref 0.0–0.2)

## 2019-11-05 LAB — COMPREHENSIVE METABOLIC PANEL
ALT: 25 U/L (ref 0–44)
AST: 19 U/L (ref 15–41)
Albumin: 2 g/dL — ABNORMAL LOW (ref 3.5–5.0)
Alkaline Phosphatase: 268 U/L — ABNORMAL HIGH (ref 38–126)
Anion gap: 9 (ref 5–15)
BUN: 17 mg/dL (ref 6–20)
CO2: 23 mmol/L (ref 22–32)
Calcium: 8.5 mg/dL — ABNORMAL LOW (ref 8.9–10.3)
Chloride: 104 mmol/L (ref 98–111)
Creatinine, Ser: 0.92 mg/dL (ref 0.44–1.00)
GFR calc Af Amer: >60 mL/min (ref >60)
GFR calc non Af Amer: >60 mL/min (ref >60)
Glucose, Bld: 135 mg/dL — ABNORMAL HIGH (ref 70–99)
Potassium: 4.1 mmol/L (ref 3.5–5.1)
Sodium: 136 mmol/L (ref 135–145)
Total Bilirubin: 0.3 mg/dL (ref 0.3–1.2)
Total Protein: 6.3 g/dL — ABNORMAL LOW (ref 6.5–8.1)

## 2019-11-05 LAB — HEPARIN LEVEL (UNFRACTIONATED)
Heparin Unfractionated: 0.29 IU/mL — ABNORMAL LOW (ref 0.30–0.70)
Heparin Unfractionated: 0.2 IU/mL — ABNORMAL LOW (ref 0.30–0.70)
Heparin Unfractionated: 0.44 IU/mL (ref 0.30–0.70)

## 2019-11-05 LAB — GLUCOSE, CAPILLARY
Glucose-Capillary: 117 mg/dL — ABNORMAL HIGH (ref 70–99)
Glucose-Capillary: 132 mg/dL — ABNORMAL HIGH (ref 70–99)
Glucose-Capillary: 138 mg/dL — ABNORMAL HIGH (ref 70–99)
Glucose-Capillary: 146 mg/dL — ABNORMAL HIGH (ref 70–99)
Glucose-Capillary: 160 mg/dL — ABNORMAL HIGH (ref 70–99)
Glucose-Capillary: 180 mg/dL — ABNORMAL HIGH (ref 70–99)

## 2019-11-05 LAB — MAGNESIUM: Magnesium: 1.6 mg/dL — ABNORMAL LOW (ref 1.7–2.4)

## 2019-11-05 LAB — PHOSPHORUS: Phosphorus: 2.8 mg/dL (ref 2.5–4.6)

## 2019-11-05 MED ORDER — HEPARIN (PORCINE) 25000 UT/250ML-% IV SOLN
3200.0000 [IU]/h | INTRAVENOUS | Status: DC
  Administered 2019-11-05: 18:00:00 3300 [IU]/h via INTRAVENOUS
  Administered 2019-11-06 (×2): 3200 [IU]/h via INTRAVENOUS
  Administered 2019-11-06: 04:00:00 3300 [IU]/h via INTRAVENOUS
  Administered 2019-11-07 – 2019-11-09 (×6): 3200 [IU]/h via INTRAVENOUS
  Filled 2019-11-05 (×9): qty 250

## 2019-11-05 MED ORDER — HEPARIN (PORCINE) 25000 UT/250ML-% IV SOLN
3100.0000 [IU]/h | INTRAVENOUS | Status: DC
  Administered 2019-11-05: 3100 [IU]/h via INTRAVENOUS
  Filled 2019-11-05 (×2): qty 250

## 2019-11-05 MED ORDER — NYSTATIN 100000 UNIT/ML MT SUSP
5.0000 mL | Freq: Four times a day (QID) | OROMUCOSAL | Status: DC
  Administered 2019-11-05 – 2019-11-12 (×26): 500000 [IU] via ORAL
  Filled 2019-11-05 (×23): qty 5

## 2019-11-05 MED ORDER — MAGNESIUM SULFATE 2 GM/50ML IV SOLN
2.0000 g | Freq: Once | INTRAVENOUS | Status: AC
  Administered 2019-11-05: 2 g via INTRAVENOUS
  Filled 2019-11-05: qty 50

## 2019-11-05 NOTE — Progress Notes (Signed)
RN noted oral thrush on corners of mouth, sides of tongue and roof of mouth likely from steroid inhaler. MD notified. Awaiting orders.

## 2019-11-05 NOTE — Progress Notes (Signed)
Brief Pharmacy Consult Note - IV heparin  Labs: heparin level 0.2  A/P: heparin level SUBtherapeutic (goal 0.3-0.7) on current IV heparin rate of 3100 units/hr. Note that patient was on apixaban PTA. Per RN, no bleeding notes or problems with IV site. Increase IV heparin from current rate of 3300 units/hr. Note that if heparin level continues to be subtherapeutic, will need to consider another anticoagulant such as Lovenox until patient can be restarted on their apixaban. Recheck heparin level 6 hours after increase IV heparin rate.   Adrian Saran, PharmD, BCPS 11/05/2019 4:35 PM

## 2019-11-05 NOTE — Progress Notes (Signed)
ANTICOAGULATION CONSULT NOTE - follow up  Pharmacy Consult for Heparin Indication: H/O PE (on Eliquis PTA) - bridge therapy until oral anticoagulation can resume  Allergies  Allergen Reactions  . Cafergot Other (See Comments)    Chest pain; ergotamine-caffiene  . Glucophage [Metformin Hcl] Anaphylaxis  . Canagliflozin Swelling and Other (See Comments)    Legs Swell invokana Legs Swell    Patient Measurements: Height: 5\' 8"  (172.7 cm) Weight: (!) 387 lb 9.1 oz (175.8 kg) IBW/kg (Calculated) : 63.9 Heparin Dosing Weight: 108 kg  Vital Signs: Temp: 98.5 F (36.9 C) (01/15 0417) BP: 138/57 (01/15 0417) Pulse Rate: 81 (01/15 0417)  Labs: Recent Labs    11/03/19 0618 11/03/19 1451 11/04/19 0746 11/04/19 1547 11/05/19 0525  HGB 9.3*  --  9.5*  --  9.3*  HCT 31.4*  --  32.1*  --  32.0*  PLT 309  --  314  --  337  LABPROT 15.6*  --  15.2  --   --   INR 1.3*  --  1.2  --   --   HEPARINUNFRC 0.22*   < > 0.28* 0.32 0.29*  CREATININE 1.28*  --  1.04*  0.92  --  0.92   < > = values in this interval not displayed.    Estimated Creatinine Clearance: 121.4 mL/min (by C-G formula based on SCr of 0.92 mg/dL).   Assessment:  55yr female admitted on 10/30/19 with performated sigmoid diverticulitis with abscess.  Patient on Eliquis PTA for h/o PE.  Eliquis held on admission pending potential procedures.  Abscess drains placed 1/10.    Pharmacy consulted to begin IV heparin without bolus for anticoagulation until cleared by general surgery for oral anticoagulation  Today, 11/05/19  Heparin level subtherapeutic at 0.29 with heparin infusing at 2950 units/hr  CBC stable  No reported bleeding or infusion problems noted per RN  Goal of Therapy:  Heparin level 0.3-0.7 units/ml Monitor platelets by anticoagulation protocol: Yes   Plan:   Per MD's instruction, no heparin bolus.   Increase IV heparin from 2950 units/hr to 3100 units/hr  Recheck heparin level at 1600  Monitor  daily CBC  F/u ability to transition back to PTA Eliquis  Lawarence Meek P. Legrand Como, PharmD, BCPS Clinical Pharmacist 11/05/2019 9:45 AM

## 2019-11-05 NOTE — Progress Notes (Signed)
Pt refused to go for ordered imaging. Pt states she wants to hold off until Monday for imaging. MD notified.

## 2019-11-05 NOTE — Progress Notes (Signed)
CC: Acute diverticulitis with abscess formation  Subjective: She is up and on the bedside commode.  She says when she eats it leads to loose stool and have wet flatus.  He abdomen is about like yesterday as far as pain goes.  The 2 remaining drains have fluid that looks more purulent than before.  She is tolerating full liquids and supplements.   Objective: Vital signs in last 24 hours: Temp:  [98.3 F (36.8 C)-99.1 F (37.3 C)] 98.5 F (36.9 C) (01/15 0417) Pulse Rate:  [77-81] 81 (01/15 0417) Resp:  [16-20] 16 (01/15 0417) BP: (138-152)/(57-70) 138/57 (01/15 0417) SpO2:  [95 %-99 %] 97 % (01/15 0857) Last BM Date: 11/05/19 Nothing p.o. recorded yesterday 365 IV recorded Voided x3 Drain #1  5 Drain #3 40 BM x2 T-max 99.1 other vital signs are stable. Still using nasal cannula at 1 L.  CMP electrolytes are normal, glucose 135, calcium 8.5, magnesium 1.6, alk phos 268 WBC up to 21.7 Intake/Output from previous day: 01/14 0701 - 01/15 0700 In: 365.5 [I.V.:290; IV Piggyback:45.5] Out: 45 [Drains:45] Intake/Output this shift: Total I/O In: 355 [P.O.:355] Out: 40 [Drains:40]  General appearance: alert, cooperative and no distress Resp: clear to auscultation bilaterally and still on O2 GI: soft, she is no more tender than before.  drains appear more purulent today.  continues to take a fair amount of pain medicine also.  Lab Results:  Recent Labs    11/04/19 0746 11/05/19 0525  WBC 18.9* 21.7*  HGB 9.5* 9.3*  HCT 32.1* 32.0*  PLT 314 337    BMET Recent Labs    11/04/19 0746 11/05/19 0525  NA 135  135 136  K 4.3  4.2 4.1  CL 104  104 104  CO2 '24  24 23  ' GLUCOSE 130*  131* 135*  BUN 21*  21* 17  CREATININE 1.04*  0.92 0.92  CALCIUM 8.3*  8.2* 8.5*   PT/INR Recent Labs    11/03/19 0618 11/04/19 0746  LABPROT 15.6* 15.2  INR 1.3* 1.2    Recent Labs  Lab 11/01/19 0526 11/02/19 0503 11/03/19 0618 11/04/19 0746 11/05/19 0525  AST 23  54* '31 25 19  ' ALT 25 43 33 34 25  ALKPHOS 83 178* 201* 274* 268*  BILITOT 0.6 0.7 0.3 0.4 0.3  PROT 6.1* 6.4* 6.1* 6.1* 6.3*  ALBUMIN 2.2* 2.1* 1.9* 1.9* 2.0*     Lipase     Component Value Date/Time   LIPASE 23 10/30/2019 1748     Medications: . atorvastatin  40 mg Oral Daily  . cholecalciferol  2,000 Units Oral Daily  . feeding supplement (PRO-STAT SUGAR FREE 64)  30 mL Oral BID  . insulin aspart  0-20 Units Subcutaneous Q4H  . insulin aspart  10 Units Subcutaneous TID WC  . insulin glargine  60 Units Subcutaneous QHS  . mometasone-formoterol  2 puff Inhalation BID  . montelukast  10 mg Oral QHS  . Ensure Max Protein  11 oz Oral BID  . saccharomyces boulardii  250 mg Oral BID  . sertraline  200 mg Oral Daily  . sodium chloride flush  3 mL Intravenous Q12H  . sodium chloride flush  5 mL Intracatheter Q8H  . sodium chloride flush  5 mL Intracatheter Q8H    Assessment/Plan C. difficile work-up for diarrhea Type 2 diabetes Hypertension Hx pulmonary embolism-chronic anticoagulation- Eliquis Hx of CVA with vision loss Chronic back pain - oxycodone/[facility] Asthma Sleep apnea  BMI 58.93 Acute on chronic kidney disease; creatinine2.21>>1.98>>1.56>>1.28 >>0.92  Acute diverticulitis with large abscess IR drain placement times 31/10/21, Dr. Markus Daft: drain #1 left lower abdominal abscess may involve left ovary and adnexa-25 mL>> 25 ml>>29>>5 Drain #2 left lower abdominal abscess medial containing gas and removed 60 mL of brown purulent fluid>> 20 mL>>20 dced 11/03/19 Drained #3 in the right lower abdomen removed 530 mL cloudy yellow fluid>>54m>>130>>40 WBC 21.5>>25.4>>20.8>>18.9>>21.7  FEN: full liquids ID: Zosyn 1/9>> day7 DVT: SQ heparin Follow-up:TBD Dilaudid 0.5 x 5,oxycodone 10 mg x 2; Zofran x 3  Plan:  We will review with IR, drainage is down, WBC is up, exam unchanged.      LOS: 6 days     Maison Agrusa 11/05/2019 Please see Amion

## 2019-11-05 NOTE — Evaluation (Signed)
Occupational Therapy Evaluation Patient Details Name: Kristen [name] MRN: [id number] DOB: 1965-10-30 Today's Date: 11/05/2019    History of Present Illness 54 yo female admitted to ED on 1/9 with abdominal pain, found to have perforated sigmoid diverticulitis with large abscess. Pt s/p CT guided drainage of abdomino-pelvic fluid, placement of 3 JP drains. PMH includes anxiety, asthma, HTN, DM, PCOS, L optic nerve, CVA, CKD III.  Now with 2 JP drains   Clinical Impression   Pt admitted with the above diagnoses and presents with below problem list. Pt will benefit from continued acute OT to address the below listed deficits and maximize independence with basic ADLs prior to d/c to venue below. PTA pt reports not needing assistance with basic ADLs but endorses struggling with ADLs PTA; did not use AD for ambulation per pt report. Pt currently min to mod A with most ADLs, +2 for safety/line management with functional transfers and pivotal steps.      Follow Up Recommendations  SNF    Equipment Recommendations  Other (comment)(defer to next venue)    Recommendations for Other Services       Precautions / Restrictions Precautions Precautions: Fall Precaution Comments: 2 JP drains Restrictions Weight Bearing Restrictions: No      Mobility Bed Mobility Overal bed mobility: Needs Assistance Bed Mobility: Sit to Supine       Sit to supine: Mod assist   General bed mobility comments: Assist to control descent, min A to advance BLE fully over EOB. Pt on specialized air mattress, deflated during return to supine.  Transfers Overall transfer level: Needs assistance Equipment used: Rolling walker (2 wheeled);2 person hand held assist Transfers: Sit to/from [facility] Sit to Stand: Min assist;From elevated surface;+2 safety/equipment Stand pivot transfers: Min guard;+2 safety/equipment       General transfer comment: rw and steadying assist to stand. Due to  confined space +2 HHA utilized for pivoting to EOB. Assist to steady. +2 assist for safety/line management    Balance Overall balance assessment: Needs assistance Sitting-balance support: No upper extremity supported;Feet supported Sitting balance-Leahy Scale: Fair     Standing balance support: Bilateral upper extremity supported Standing balance-Leahy Scale: Fair Standing balance comment: +2 HHA vs RW for support in standing                           ADL either performed or assessed with clinical judgement   ADL Overall ADL's : Needs assistance/impaired Eating/Feeding: Set up;Sitting   Grooming: Set up;Sitting;Min guard   Upper Body Bathing: Moderate assistance;Sitting   Lower Body Bathing: Moderate assistance;+2 for safety/equipment;Sit to/from stand   Upper Body Dressing : Moderate assistance;Sitting   Lower Body Dressing: Moderate assistance;+2 for safety/equipment;Sit to/from stand   Toilet Transfer: Minimal assistance;+2 for safety/equipment;Stand-pivot;RW;BSC;Requires wide/bariatric   Toileting- Clothing Manipulation and Hygiene: Maximal assistance;+2 for safety/equipment Toileting - Clothing Manipulation Details (indicate cue type and reason): pt stood with min A, total A for pericare tasks Tub/ Shower Transfer: Minimal assistance;+2 for safety/equipment;Stand-pivot;Rolling walker;3 in 1   Functional mobility during ADLs: Minimal assistance;+2 for safety/equipment;Rolling walker(pivotal steps) General ADL Comments: Pt completed pivotal steps from [facility] to EOB. Pt reports she has been sitting up for a few hours.      Vision         Perception     Praxis      Pertinent Vitals/Pain Pain Assessment: Faces Faces Pain Scale: Hurts little more Pain Location: abdomen, sitting EOB and  during mobility Pain Descriptors / Indicators: Sore;Discomfort;Guarding Pain Intervention(s): Monitored during session;Limited activity within patient's tolerance;Repositioned      Hand Dominance Right   Extremity/Trunk Assessment Upper Extremity Assessment Upper Extremity Assessment: Overall WFL for tasks assessed;Generalized weakness   Lower Extremity Assessment Lower Extremity Assessment: Defer to PT evaluation   Cervical / Trunk Assessment Cervical / Trunk Assessment: Normal;Other exceptions(large body habitus)   Communication Communication Communication: No difficulties   Cognition Arousal/Alertness: Awake/alert Behavior During Therapy: WFL for tasks assessed/performed Overall Cognitive Status: Within Functional Limits for tasks assessed                                     General Comments  Pt reporting bumps on roof of her mouth. NT to provide oral care setup. Pt reports it has been a few days since she has completed any oral care    Exercises     Shoulder Instructions      Home Living Family/patient expects to be discharged to:: Private residence Living Arrangements: Other relatives Available Help at Discharge: Family;Available PRN/intermittently Type of Home: House Home Access: Stairs to enter [employer] of Steps: 2   Home Layout: One level     Bathroom Shower/Tub: Teacher, early years/pre: Standard     Home Equipment: Environmental consultant - 4 wheels;Walker - 2 wheels;Cane - single point;Shower seat          Prior Functioning/Environment Level of Independence: Needs assistance  Gait / Transfers Assistance Needed: Pt reports not typically using AD for ambulation, but has rollator that she uses if she needs it ADL's / Homemaking Assistance Needed: Pt reports "not having a good bath in quite a while", states she does everything for self. suspect she struggles with ADLs            OT Problem List: Impaired balance (sitting and/or standing);Decreased strength;Decreased activity tolerance;Decreased knowledge of use of DME or AE;Decreased knowledge of precautions;Pain;Impaired UE functional use;Obesity       OT Treatment/Interventions: Self-care/ADL training;Therapeutic exercise;DME and/or AE instruction;Therapeutic activities;Patient/family education;Balance training;Energy conservation    OT Goals(Current goals can be found in the care plan section) Acute Rehab OT Goals Patient Stated Goal: take care of myself OT Goal Formulation: With patient Time For Goal Achievement: 11/19/19 Potential to Achieve Goals: Good ADL Goals Pt Will Perform Grooming: with set-up;with modified independence;sitting Pt Will Perform Upper Body Dressing: with set-up;sitting Pt Will Perform Lower Body Dressing: with min guard assist;sit to/from stand Pt Will Transfer to Toilet: with min guard assist;ambulating;bedside commode Pt Will Perform Toileting - Clothing Manipulation and hygiene: sit to/from stand;with min assist  OT Frequency: Min 2X/week   Barriers to D/C:            Co-evaluation              AM-PAC OT "6 Clicks" Daily Activity     Outcome Measure Help from another person eating meals?: None Help from another person taking care of personal grooming?: A Little Help from another person toileting, which includes using toliet, bedpan, or urinal?: A Lot Help from another person bathing (including washing, rinsing, drying)?: A Lot Help from another person to put on and taking off regular upper body clothing?: A Little Help from another person to put on and taking off regular lower body clothing?: A Lot 6 Click Score: 16   End of Session Equipment Utilized During Treatment: Oxygen;Rolling walker Nurse Communication:  Other (comment)(NT present throughout session)  Activity Tolerance: Patient tolerated treatment well Patient left: in bed;with call bell/phone within reach  OT Visit Diagnosis: Other abnormalities of gait and mobility (R26.89);Muscle weakness (generalized) (M62.81);Pain;Unsteadiness on feet (R26.81)                Time: 1836-7255 OT Time Calculation (min): 19 min Charges:  OT  General Charges $OT Visit: 1 Visit OT Evaluation $OT Eval Low Complexity: [place], OT Acute Rehabilitation Services Pager: [phone] Office: [phone]   Hortencia Pilar 11/05/2019, 11:54 AM

## 2019-11-05 NOTE — Progress Notes (Signed)
PROGRESS NOTE    Kristen [name]  FWY:[id number] DOB: 28-Jun-1966 DOA: 10/30/2019 PCP: Lawerance Cruel, MD  Brief Narrative: 54 year old Caucasian female with PMH of morbid obesity, type 2 diabetes mellitus, chronic pain on opioids, hypertension, depression, anxiety, asthma, s/p cholecystectomy, CVA with residual vision loss, PCOS, sleep apnea who was brought to the ED by EMS from home for worsening abdominal painassociated with diarrhea. Recently treated for UTI with no improvement in abd pain.[place] home and largely immobile. Reported chronicdepressionand sometimes has suicidal ideations but none now. ED Course: Afebrile,noted to be hypertensive, tachycardic.Found to have leukocytosis of 26K and blood sugar elevated in the 300s. CT abdomen pelvis without contrast (no contrast due to renal dysfunction)showed perforated sigmoid diverticulitis with a large 12 cm abscess in addition to complex fluid collections extending into the patient's pelvis. ED provider spoke to surgeon on call Dr. Gala Lewandowsky who indicated no surgical need at this time and recommended IR consult for drainage of the abscess. Hospital course: [facility] and IR consulted. Patient underwent CT guided drain x3 placement on 1/10.  Assessment & Plan:   Principal Problem:   Diverticulitis of large intestine with abscess Active Problems:   Essential hypertension   Asthma   Obesity, Class III, BMI 40-49.9 (morbid obesity) (HCC)   Type 2 diabetes mellitus with stage 3 chronic kidney disease (HCC)   Diverticulitis   Abscess of sigmoid colon   History of pulmonary embolus (PE)   AKI (acute kidney injury) ([place])   Hyponatremia   Hyperglycemia   #1 perforated sigmoid diverticulitis with complicated large abscess 12 cm-she has 2 drains in place.  One of the drains were taken out by IR 11/03/2019.  Draining purulent material. C. difficile negative On Zosyn Continue Dilaudid for pain control, she still  believes she needs a Dilaudid for intense pain. Drains placed by IR Followed by surgery WBC  21.7 from 18.9  Advance diet per general surgery. Repeat CT abdomen today.  #2 AKI on CKD stage III A-creatinine 1.86 on admission peaked to 2.2 and trending down.She received IV fluids.  Creatinine 0.92 from 1.04  yesterday.  ACE inhibitor on hold .  #3 type 2 diabetes with hyperglycemia on Lantus 60 units at bedtime with sliding scale insulin. CBG (last 3)  Recent Labs    11/05/19 0419 11/05/19 0803 11/05/19 1143  GLUCAP 132* 117* 180*     #4 essential hypertension -blood pressure is 138/57.  Improved compared to yesterday of 156/71.  Not on any antihypertensives at this time.    #5 history of asthma stable continue inhalers and encourage incentive spirometry. Continue Singulair and Dulera.  #6 morbid obesity needs ongoing counseling regarding weight loss  #7 depression continue home meds Zoloft 200 mg daily.  #8 pyuria with rare bacteriuria initially treated for UTI.  #9 thrush-nystatin ordered   Nutrition Problem: Increased nutrient needs Etiology: acute illness     Signs/Symptoms: estimated needs    Interventions: Prostat(Ensure Max)  Estimated body mass index is 58.93 kg/m as calculated from the following:   Height as of this encounter: 5\' 8"  (1.727 m).   Weight as of this encounter: 175.8 kg.   DVT prophylaxis:Heparin Code Status: Full code discussed with patient Family Communication:Discussed with patient  disposition Planpending further clinical improvement patient still on clear liquids and has 2 drains in place followed by IR and surgery and need for SNF bed Patient still with increasing leukocytosis repeat CT to be done today.  Consultants:  IR  and general surgery   Procedures: 3 abdominal drains placed by IR  Antimicrobials Zosyn  Subjective: Feels about the same pain is about the same feels loose stools are more with drinking  protein shakes.  No fever.  Still has abdominal pain but not worse than yesterday.  Objective: Vitals:   11/04/19 2056 11/04/19 2105 11/05/19 0417 11/05/19 0857  BP: (!) 152/70  (!) 138/57   Pulse: 77  81   Resp: 20  16   Temp: 98.3 F (36.8 C)  98.5 F (36.9 C)   TempSrc: Oral     SpO2: 99% 98% 95% 97%  Weight:      Height:        Intake/Output Summary (Last 24 hours) at 11/05/2019 1449 Last data filed at 11/05/2019 1239 Gross per 24 hour  Intake 1423.5 ml  Output 110 ml  Net 1313.5 ml   Filed Weights   10/31/19 0200  Weight: (!) 175.8 kg    Examination:  General exam: Appears calm and comfortable  Respiratory system: Clear to auscultation. Respiratory effort normal. Cardiovascular system: S1 & S2 heard, RRR. No JVD, murmurs, rubs, gallops or clicks. No pedal edema. Gastrointestinal system: Abdomen is distended, soft and tender. No organomegaly or masses felt. Normal bowel sounds heard.  2 drains on both sides of the abdomen draining purulent material. Central nervous system: Alert and oriented. No focal neurological deficits. Extremities: Trace bilateral pitting edema.   Skin: No rashes, lesions or ulcers Psychiatry: Judgement and insight appear normal. Mood & affect appropriate.     Data Reviewed: I have personally reviewed following labs and imaging studies  CBC: Recent Labs  Lab 11/01/19 0526 11/02/19 0503 11/03/19 0618 11/04/19 0746 11/05/19 0525  WBC 21.5* 25.4* 20.8* 18.9* 21.7*  HGB 9.6* 9.8* 9.3* 9.5* 9.3*  HCT 32.0* 32.8* 31.4* 32.1* 32.0*  MCV 88.2 86.8 88.0 87.5 87.4  PLT 317 352 309 314 951   Basic Metabolic Panel: Recent Labs  Lab 11/01/19 0526 11/02/19 0503 11/03/19 0618 11/04/19 0746 11/05/19 0525  NA 138 136 135 135  135 136  K 4.1 4.3 4.3 4.3  4.2 4.1  CL 103 105 105 104  104 104  CO2 22 22 22 24  24 23   GLUCOSE 162* 115* 94 130*  131* 135*  BUN 44* 36* 28* 21*  21* 17  CREATININE 1.98* 1.56* 1.28* 1.04*  0.92 0.92    CALCIUM 8.1* 8.3* 8.2* 8.3*  8.2* 8.5*  MG 1.7 1.8 1.9 1.7 1.6*  PHOS 4.2 4.4 4.2 3.2 2.8   GFR: Estimated Creatinine Clearance: 121.4 mL/min (by C-G formula based on SCr of 0.92 mg/dL). Liver Function Tests: Recent Labs  Lab 11/01/19 0526 11/02/19 0503 11/03/19 0618 11/04/19 0746 11/05/19 0525  AST 23 54* 31 25 19   ALT 25 43 33 34 25  ALKPHOS 83 178* 201* 274* 268*  BILITOT 0.6 0.7 0.3 0.4 0.3  PROT 6.1* 6.4* 6.1* 6.1* 6.3*  ALBUMIN 2.2* 2.1* 1.9* 1.9* 2.0*   Recent Labs  Lab 10/30/19 1748  LIPASE 23   No results for input(s): AMMONIA in the last 168 hours. Coagulation Profile: Recent Labs  Lab 10/31/19 1056 11/01/19 0526 11/02/19 0503 11/03/19 0618 11/04/19 0746  INR 1.4* 1.4* 1.3* 1.3* 1.2   Cardiac Enzymes: No results for input(s): CKTOTAL, CKMB, CKMBINDEX, TROPONINI in the last 168 hours. BNP (last 3 results) No results for input(s): PROBNP in the last 8760 hours. HbA1C: No results for input(s): HGBA1C in the last 72 hours.  CBG: Recent Labs  Lab 11/04/19 2059 11/05/19 0028 11/05/19 0419 11/05/19 0803 11/05/19 1143  GLUCAP 120* 160* 132* 117* 180*   Lipid Profile: No results for input(s): CHOL, HDL, LDLCALC, TRIG, CHOLHDL, LDLDIRECT in the last 72 hours. Thyroid Function Tests: No results for input(s): TSH, T4TOTAL, FREET4, T3FREE, THYROIDAB in the last 72 hours. Anemia Panel: No results for input(s): VITAMINB12, FOLATE, FERRITIN, TIBC, IRON, RETICCTPCT in the last 72 hours. Sepsis Labs: Recent Labs  Lab 10/30/19 1916 10/31/19 0558  LATICACIDVEN 1.3 1.4    Recent Results (from the past 240 hour(s))  Respiratory Panel by RT PCR (Flu A&B, Covid) - Nasopharyngeal Swab     Status: None   Collection Time: 10/30/19  7:25 PM   Specimen: Nasopharyngeal Swab  Result Value Ref Range Status   SARS Coronavirus 2 by RT PCR NEGATIVE NEGATIVE Final    Comment: (NOTE) SARS-CoV-2 target nucleic acids are NOT DETECTED. The SARS-CoV-2 RNA is generally  detectable in upper respiratoy specimens during the acute phase of infection. The lowest concentration of SARS-CoV-2 viral copies this assay can detect is 131 copies/mL. A negative result does not preclude SARS-Cov-2 infection and should not be used as the sole basis for treatment or other patient management decisions. A negative result may occur with  improper specimen collection/handling, submission of specimen other than nasopharyngeal swab, presence of viral mutation(s) within the areas targeted by this assay, and inadequate number of viral copies (<131 copies/mL). A negative result must be combined with clinical observations, patient history, and epidemiological information. The expected result is Negative. Fact Sheet for Patients:  PinkCheek.be Fact Sheet for Healthcare Providers:  GravelBags.it This test is not yet ap proved or cleared by the [place] FDA and  has been authorized for detection and/or diagnosis of SARS-CoV-2 by FDA under an Emergency Use Authorization (EUA). This EUA will remain  in effect (meaning this test can be used) for the duration of the COVID-19 declaration under Section 564(b)(1) of the Act, 21 U.S.C. section 360bbb-3(b)(1), unless the authorization is terminated or revoked sooner.    Influenza A by PCR NEGATIVE NEGATIVE Final   Influenza B by PCR NEGATIVE NEGATIVE Final    Comment: (NOTE) The Xpert Xpress SARS-CoV-2/FLU/RSV assay is intended as an aid in  the diagnosis of influenza from Nasopharyngeal swab specimens and  should not be used as a sole basis for treatment. Nasal washings and  aspirates are unacceptable for Xpert Xpress SARS-CoV-2/FLU/RSV  testing. Fact Sheet for Patients: PinkCheek.be Fact Sheet for Healthcare Providers: GravelBags.it This test is not yet approved or cleared by the [place] FDA and  has been  authorized for detection and/or diagnosis of SARS-CoV-2 by  FDA under an Emergency Use Authorization (EUA). This EUA will remain  in effect (meaning this test can be used) for the duration of the  Covid-19 declaration under Section 564(b)(1) of the Act, 21  U.S.C. section 360bbb-3(b)(1), unless the authorization is  terminated or revoked. Performed at [facility], [place] [address] [place], [place] [address]   Culture, blood (routine x 2)     Status: None (Preliminary result)   Collection Time: 10/30/19  8:13 PM   Specimen: BLOOD  Result Value Ref Range Status   Specimen Description   Final    BLOOD LEFT HAND Performed at [place] [address], [place], [place] [address]    Special Requests   Final    BOTTLES DRAWN AEROBIC AND ANAEROBIC Blood Culture adequate volume Performed  at [facility], [place] [address], [place], [place] [address]    Culture   Final    NO GROWTH 4 DAYS Performed at [facility], [place] [address], [place], [place] [address]    Report Status PENDING  Incomplete  Culture, blood (routine x 2)     Status: None (Preliminary result)   Collection Time: 10/30/19 11:00 PM   Specimen: BLOOD RIGHT HAND  Result Value Ref Range Status   Specimen Description   Final    BLOOD RIGHT HAND Performed at [facility] [place] [address] [place], [place] [address]    Special Requests   Final    BOTTLES DRAWN AEROBIC AND ANAEROBIC Blood Culture adequate volume Performed at [place] [address] [place], [place] [address]    Culture   Final    NO GROWTH 4 DAYS Performed at [facility], [place] [address], [place], [place] [address]    Report Status PENDING  Incomplete  C difficile quick scan w PCR reflex     Status: None   Collection Time: 10/31/19  8:16 AM   Specimen: STOOL  Result Value Ref Range Status   C Diff antigen NEGATIVE NEGATIVE Final   C Diff toxin  NEGATIVE NEGATIVE Final   C Diff interpretation No C. difficile detected.  Final    Comment: Performed at [facility], [place] [address] [place], [place] [address]  Aerobic/Anaerobic Culture (surgical/deep wound)     Status: None   Collection Time: 10/31/19  4:11 PM   Specimen: Abscess  Result Value Ref Range Status   Specimen Description   Final    ABSCESS Performed at [place] [address], [place], [place] [address]    Special Requests   Final    1 LEFT ABDOMINAL LATERAL Performed at [facility], [place] [address], [place], [place] [address]    Gram Stain   Final    ABUNDANT WBC PRESENT, PREDOMINANTLY PMN ABUNDANT GRAM POSITIVE COCCI IN PAIRS IN CLUSTERS IN CHAINS FEW GRAM NEGATIVE COCCOBACILLI RARE GRAM POSITIVE RODS Performed at [facility], [place] [address], [place], [place] [address]    Culture   Final    MODERATE ESCHERICHIA COLI MIXED ANAEROBIC FLORA PRESENT.  CALL LAB IF FURTHER IID REQUIRED.    Report Status 11/04/2019 FINAL  Final   Organism ID, Bacteria ESCHERICHIA COLI  Final      Susceptibility   Escherichia coli - MIC*    AMPICILLIN >=32 RESISTANT Resistant     CEFAZOLIN <=4 SENSITIVE Sensitive     CEFEPIME <=0.12 SENSITIVE Sensitive     CEFTAZIDIME <=1 SENSITIVE Sensitive     CEFTRIAXONE <=0.25 SENSITIVE Sensitive     CIPROFLOXACIN >=4 RESISTANT Resistant     GENTAMICIN >=16 RESISTANT Resistant     IMIPENEM <=0.25 SENSITIVE Sensitive     TRIMETH/SULFA >=320 RESISTANT Resistant     AMPICILLIN/SULBACTAM >=32 RESISTANT Resistant     PIP/TAZO <=4 SENSITIVE Sensitive     * MODERATE ESCHERICHIA COLI  Aerobic/Anaerobic Culture (surgical/deep wound)     Status: None (Preliminary result)   Collection Time: 10/31/19  4:19 PM   Specimen: Abscess  Result Value Ref Range Status   Specimen Description   Final    ABSCESS Performed at [place] [address] [place],   [address]    Special Requests   Final    2 LEFT ABDOMEN MEDIAL Performed  at [facility], [place] [address], [place], [place] [address]    Gram Stain   Final    FEW WBC PRESENT,BOTH PMN AND MONONUCLEAR ABUNDANT GRAM POSITIVE COCCI IN PAIRS IN CHAINS RARE GRAM POSITIVE RODS RARE GRAM NEGATIVE RODS    Culture   Final    ABUNDANT ACTINOMYCES NEUII Standardized susceptibility testing for this organism is not available. HOLDING FOR POSSIBLE ANAEROBE Performed at [facility], [place] [address], [place],  [address]    Report Status PENDING  Incomplete         Radiology Studies: No results found.      Scheduled Meds: . atorvastatin  40 mg Oral Daily  . cholecalciferol  2,000 Units Oral Daily  . feeding supplement (PRO-STAT SUGAR FREE 64)  30 mL Oral BID  . insulin aspart  0-20 Units Subcutaneous Q4H  . insulin aspart  10 Units Subcutaneous TID WC  . insulin glargine  60 Units Subcutaneous QHS  . mometasone-formoterol  2 puff Inhalation BID  . montelukast  10 mg Oral QHS  . Ensure Max Protein  11 oz Oral BID  . saccharomyces boulardii  250 mg Oral BID  . sertraline  200 mg Oral Daily  . sodium chloride flush  3 mL Intravenous Q12H  . sodium chloride flush  5 mL Intracatheter Q8H  . sodium chloride flush  5 mL Intracatheter Q8H   Continuous Infusions: . heparin 3,100 Units/hr (11/05/19 1317)  . piperacillin-tazobactam (ZOSYN)  IV 3.375 g (11/05/19 1158)     LOS: 6 days     Georgette Shell, MD [facility]  If 7PM-7AM, please contact night-coverage [email or web address] Password [facility] 11/05/2019, 2:49 PM

## 2019-11-05 NOTE — Progress Notes (Signed)
Referring Physician(s): Armandina Gemma  Supervising Physician: Daryll Brod  Patient Status:  [facility] - In-pt  Chief Complaint:  Follow up drains  Brief History:  Multiple intra-abdominal fluid collections.  S/p RLQ and two LLQ drain placements in IR 10/31/2019 by Dr. Anselm Pancoast; s/p removal of one of the LLQ drains 11/03/2019 (patient currently has 2 IR drains).   WBC slightly up today 18 -> 21.7. No fever.   Still purulent output.   LLQ drain with less output, down to abaout 10 mL. Right still good output.   Gen Surg is concerned about the WBC trend up after 3 days of downward trend.  Subjective:  In bed. No complaints.  Allergies: Cafergot, Glucophage [metformin hcl], and Canagliflozin  Medications: Prior to Admission medications   Medication Sig Start Date End Date Taking? Authorizing Provider  acetaminophen (TYLENOL) 500 MG tablet Take 1,000 mg by mouth every 6 (six) hours as needed for moderate pain.   Yes [provider]  albuterol (ACCUNEB) 1.25 MG/3ML nebulizer solution Inhale 1 ampule into the lungs 3 (three) times daily as needed for wheezing or shortness of breath.  12/12/10  Yes [provider]  albuterol (VENTOLIN HFA) 108 (90 Base) MCG/ACT inhaler Inhale 2 puffs into the lungs every 4 (four) hours as needed for wheezing or shortness of breath.  01/04/14  Yes [provider]  amoxicillin-clavulanate (AUGMENTIN) 875-125 MG tablet Take 1 tablet by mouth 2 (two) times daily. 10/25/19  Yes [provider]  apixaban (ELIQUIS) 5 MG TABS tablet Take two tabs twice a day for 7 days, then take one tab twice a day for a year. Patient taking differently: Take 2.5 mg by mouth 2 (two) times daily.  03/24/17  Yes Florencia Reasons, MD  aspirin EC 81 MG tablet Take 81 mg by mouth daily.   Yes [provider]  atorvastatin (LIPITOR) 40 MG tablet Take 40 mg by mouth daily.   Yes [provider]  buPROPion (WELLBUTRIN XL) 150 MG 24 hr tablet Take  150 mg by mouth every morning. 09/13/19  Yes [provider]  carisoprodol (SOMA) 350 MG tablet TAKE 1 TABLET (350 MG TOTAL) BY MOUTH 3 (THREE) TIMES DAILY. 09/15/19  Yes Meredith Staggers, MD  Cholecalciferol (VITAMIN D3) 2000 units capsule Take 2,000 Units by mouth daily.    Yes [provider]  Fluticasone-Salmeterol (ADVAIR) 500-50 MCG/DOSE AEPB Inhale 1 puff into the lungs every 12 (twelve) hours.   Yes [provider]  insulin aspart (NOVOLOG FLEXPEN) 100 UNIT/ML FlexPen Inject 15 Units into the skin 3 (three) times daily with meals. Patient taking differently: Inject 44 Units into the skin 3 (three) times daily with meals.  03/24/17  Yes Florencia Reasons, MD  insulin glargine (LANTUS) 100 UNIT/ML injection Inject 80 Units into the skin at bedtime.    Yes [provider]  lisinopril (PRINIVIL,ZESTRIL) 20 MG tablet Take 1 tablet (20 mg total) by mouth daily. 03/24/17 10/30/19 Yes Florencia Reasons, MD  montelukast (SINGULAIR) 10 MG tablet Take 10 mg by mouth at bedtime.    Yes [provider]  oxybutynin (DITROPAN) 5 MG tablet Take 5 mg by mouth 2 (two) times daily.  03/13/16  Yes [provider]  Oxycodone HCl 10 MG TABS Take 1 tablet (10 mg total) by mouth every 8 (eight) hours as needed. Patient taking differently: Take 10 mg by mouth every 8 (eight) hours as needed (pain).  08/31/19  Yes Bayard Hugger, NP  promethazine [facility])  25 MG tablet Take 25 mg by mouth every 6 (six) hours as needed for nausea or vomiting.    Yes [provider]  senna-docusate (SENOKOT-S) 8.6-50 MG tablet Take 1 tablet by mouth at bedtime. 03/24/17  Yes Florencia Reasons, MD  sertraline (ZOLOFT) 100 MG tablet Take 200 mg by mouth daily.    Yes [provider]  BD PEN NEEDLE NANO U/F 32G X 4 MM MISC 2 (two) times daily. as directed 03/13/16   [provider]  nystatin cream (MYCOSTATIN) Apply 1 application topically 2 (two) times daily. Patient not taking: Reported  on 10/31/2019 03/11/18   Huel Cote, NP     Vital Signs: BP (!) 138/57 (BP Location: Right Arm)   Pulse 81   Temp 98.5 F (36.9 C)   Resp 16   Ht 5\' 8"  (1.727 m)   Wt (!) 175.8 kg   SpO2 97%   BMI 58.93 kg/m   Physical Exam Awake and alert NAD Right bulb with tan milky drainage, ~70 mL output Left bulb with clear serous drainage, ~20 mL output  Imaging: DG CHEST PORT 1 VIEW  Result Date: 11/02/2019 CLINICAL DATA:  54 year old female with dyspnea EXAM: PORTABLE CHEST 1 VIEW COMPARISON:  Chest radiograph dated 07/03/2018. FINDINGS: Shallow inspiration. No focal consolidation, pleural effusion, pneumothorax. Mild cardiomegaly. No acute osseous pathology. IMPRESSION: 1. No acute cardiopulmonary process. 2. Stable mild cardiomegaly. Electronically Signed   By: Anner Crete M.D.   On: 11/02/2019 20:50    Labs:  CBC: Recent Labs    11/02/19 0503 11/03/19 0618 11/04/19 0746 11/05/19 0525  WBC 25.4* 20.8* 18.9* 21.7*  HGB 9.8* 9.3* 9.5* 9.3*  HCT 32.8* 31.4* 32.1* 32.0*  PLT 352 309 314 337    COAGS: Recent Labs    11/01/19 0526 11/02/19 0503 11/03/19 0618 11/04/19 0746  INR 1.4* 1.3* 1.3* 1.2    BMP: Recent Labs    11/02/19 0503 11/03/19 0618 11/04/19 0746 11/05/19 0525  NA 136 135 135  135 136  K 4.3 4.3 4.3  4.2 4.1  CL 105 105 104  104 104  CO2 22 22 24  24 23   GLUCOSE 115* 94 130*  131* 135*  BUN 36* 28* 21*  21* 17  CALCIUM 8.3* 8.2* 8.3*  8.2* 8.5*  CREATININE 1.56* 1.28* 1.04*  0.92 0.92  GFRNONAA 38* 48* >60  >60 >60  GFRAA 44* 55* >60  >60 >60    LIVER FUNCTION TESTS: Recent Labs    11/02/19 0503 11/03/19 0618 11/04/19 0746 11/05/19 0525  BILITOT 0.7 0.3 0.4 0.3  AST 54* 31 25 19   ALT 43 33 34 25  ALKPHOS 178* 201* 274* 268*  PROT 6.4* 6.1* 6.1* 6.3*  ALBUMIN 2.1* 1.9* 1.9* 2.0*    Assessment and Plan:  Multiple intra-abdominal fluid collections.  S/p RLQ and two LLQ drain placements in IR 10/31/2019 by Dr.  Anselm Pancoast  S/p removal of one of the LLQ drains 11/03/2019 (patient currently has 2 IR drains).   WBC slightly up today 18 -> 21.7. No fever.   Discussed with Dr. Annamaria Boots. Drain in place only 5 days.  He recommends continue flushing drain and keep to bulb.  Consider re-imaging on Monday if WBC continues to trend up.  Electronically Signed: Murrell Redden, PA-C 11/05/2019, 2:35 PM    I spent a total of 15 Minutes at the the patient's bedside AND on the patient's hospital floor or unit, greater than 50% of which was counseling/coordinating  care for f/u Drain.

## 2019-11-06 LAB — MAGNESIUM: Magnesium: 1.7 mg/dL (ref 1.7–2.4)

## 2019-11-06 LAB — CBC
HCT: 31.1 % — ABNORMAL LOW (ref 36.0–46.0)
Hemoglobin: 9.3 g/dL — ABNORMAL LOW (ref 12.0–15.0)
MCH: 26.1 pg (ref 26.0–34.0)
MCHC: 29.9 g/dL — ABNORMAL LOW (ref 30.0–36.0)
MCV: 87.1 fL (ref 80.0–100.0)
Platelets: 322 10*3/uL (ref 150–400)
RBC: 3.57 MIL/uL — ABNORMAL LOW (ref 3.87–5.11)
RDW: 15 % (ref 11.5–15.5)
WBC: 19 10*3/uL — ABNORMAL HIGH (ref 4.0–10.5)
nRBC: 0 % (ref 0.0–0.2)

## 2019-11-06 LAB — GLUCOSE, CAPILLARY
Glucose-Capillary: 114 mg/dL — ABNORMAL HIGH (ref 70–99)
Glucose-Capillary: 123 mg/dL — ABNORMAL HIGH (ref 70–99)
Glucose-Capillary: 147 mg/dL — ABNORMAL HIGH (ref 70–99)
Glucose-Capillary: 149 mg/dL — ABNORMAL HIGH (ref 70–99)
Glucose-Capillary: 151 mg/dL — ABNORMAL HIGH (ref 70–99)
Glucose-Capillary: 186 mg/dL — ABNORMAL HIGH (ref 70–99)

## 2019-11-06 LAB — BASIC METABOLIC PANEL
Anion gap: 6 (ref 5–15)
BUN: 14 mg/dL (ref 6–20)
CO2: 26 mmol/L (ref 22–32)
Calcium: 8.4 mg/dL — ABNORMAL LOW (ref 8.9–10.3)
Chloride: 104 mmol/L (ref 98–111)
Creatinine, Ser: 0.9 mg/dL (ref 0.44–1.00)
GFR calc Af Amer: >60 mL/min (ref >60)
GFR calc non Af Amer: >60 mL/min (ref >60)
Glucose, Bld: 129 mg/dL — ABNORMAL HIGH (ref 70–99)
Potassium: 4.4 mmol/L (ref 3.5–5.1)
Sodium: 136 mmol/L (ref 135–145)

## 2019-11-06 LAB — PHOSPHORUS: Phosphorus: 3.2 mg/dL (ref 2.5–4.6)

## 2019-11-06 LAB — HEPARIN LEVEL (UNFRACTIONATED)
Heparin Unfractionated: 0.77 IU/mL — ABNORMAL HIGH (ref 0.30–0.70)
Heparin Unfractionated: 0.58 IU/mL (ref 0.30–0.70)

## 2019-11-06 MED ORDER — POLYETHYLENE GLYCOL 3350 17 G PO PACK
17.0000 g | PACK | Freq: Every day | ORAL | Status: DC
  Administered 2019-11-06 – 2019-11-10 (×4): 17 g via ORAL
  Filled 2019-11-06 (×4): qty 1

## 2019-11-06 MED ORDER — SODIUM CHLORIDE 0.9% FLUSH
3.0000 mL | Freq: Two times a day (BID) | INTRAVENOUS | Status: DC
  Administered 2019-11-06 – 2019-11-12 (×12): 3 mL via INTRAVENOUS

## 2019-11-06 MED ORDER — ALUM & MAG HYDROXIDE-SIMETH 200-200-20 MG/5ML PO SUSP: 30.0000 mL | Freq: Four times a day (QID) | ORAL | Status: DC | PRN

## 2019-11-06 MED ORDER — GUAIFENESIN-DM 100-10 MG/5ML PO SYRP
10.0000 mL | ORAL_SOLUTION | ORAL | Status: DC | PRN
  Administered 2019-11-06: 10 mL via ORAL
  Filled 2019-11-06: qty 10

## 2019-11-06 MED ORDER — METHOCARBAMOL 1000 MG/10ML IJ SOLN
1000.0000 mg | Freq: Four times a day (QID) | INTRAVENOUS | Status: DC | PRN
  Filled 2019-11-06: qty 10

## 2019-11-06 MED ORDER — HYDROCORTISONE (PERIANAL) 2.5 % EX CREA
1.0000 "application " | TOPICAL_CREAM | Freq: Four times a day (QID) | CUTANEOUS | Status: DC | PRN
  Filled 2019-11-06: qty 28.35

## 2019-11-06 MED ORDER — SODIUM CHLORIDE 0.9 % IV SOLN: 250.0000 mL | INTRAVENOUS | Status: DC | PRN

## 2019-11-06 MED ORDER — BISACODYL 10 MG RE SUPP: 10.0000 mg | Freq: Two times a day (BID) | RECTAL | Status: DC | PRN

## 2019-11-06 MED ORDER — MENTHOL 3 MG MT LOZG: 1.0000 | LOZENGE | OROMUCOSAL | Status: DC | PRN

## 2019-11-06 MED ORDER — LIP MEDEX EX OINT
1.0000 "application " | TOPICAL_OINTMENT | Freq: Two times a day (BID) | CUTANEOUS | Status: DC
  Administered 2019-11-06 – 2019-11-12 (×13): 1 via TOPICAL
  Filled 2019-11-06 (×4): qty 7

## 2019-11-06 MED ORDER — HYDROCORTISONE 1 % EX CREA
1.0000 "application " | TOPICAL_CREAM | Freq: Three times a day (TID) | CUTANEOUS | Status: DC | PRN
  Filled 2019-11-06: qty 28

## 2019-11-06 MED ORDER — LACTATED RINGERS IV BOLUS: 1000.0000 mL | Freq: Three times a day (TID) | INTRAVENOUS | Status: AC | PRN

## 2019-11-06 MED ORDER — SODIUM CHLORIDE 0.9% FLUSH: 3.0000 mL | INTRAVENOUS | Status: DC | PRN

## 2019-11-06 MED ORDER — MAGIC MOUTHWASH
15.0000 mL | Freq: Four times a day (QID) | ORAL | Status: DC | PRN
  Filled 2019-11-06: qty 15

## 2019-11-06 MED ORDER — PHENOL 1.4 % MT LIQD
1.0000 | OROMUCOSAL | Status: DC | PRN
  Filled 2019-11-06: qty 177

## 2019-11-06 NOTE — Progress Notes (Signed)
Pt refuses cpap

## 2019-11-06 NOTE — Progress Notes (Signed)
Kristen [name] [id number] [birth year]  CARE TEAM:  PCP: Lawerance Cruel, MD  Outpatient Care Team: Patient Care Team: Lawerance Cruel, MD as PCP - General (Family Medicine)  Inpatient Treatment Team: Treatment Team: Attending Provider: Georgette Shell, MD; Consulting Physician: Edison Pace, Md, MD; Rounding Team: Dorthy Cooler Radiology, MD; Social Worker: Merri Brunette; Registered Nurse: Thompson Grayer, RN; Rounding Team: Fatima Blank, MD; Registered Nurse: Marlowe Kays, RN; Technician: Berenice Bouton, NT; Registered Nurse: Mliss Sax, RN; Registered Nurse: Fortunato Curling, RN   Problem List:   Principal Problem:   Diverticulitis of large intestine with abscess Active Problems:   Essential hypertension   Asthma   Obesity, Class III, BMI 40-49.9 (morbid obesity) ([place])   Type 2 diabetes mellitus with stage 3 chronic kidney disease (HCC)   Diverticulitis   Abscess of sigmoid colon   History of pulmonary embolus (PE)   AKI (acute kidney injury) ([place])   Hyponatremia   Hyperglycemia      * No surgery found *      Assessment  C. difficile work-up for diarrhea Type 2 diabetes Hypertension Hx pulmonary embolism-chronic anticoagulation- Eliquis Hx of CVA with vision loss Chronic back pain - oxycodone/[facility] Asthma Sleep apnea BMI 58.93 Acute on chronic kidney disease; creatinine2.21>>1.98>>1.56>>1.28>>0.92  Acute diverticulitis with large abscess  Plan:  IR drain placement x3 on 10/31/19, Dr. Markus Daft: Drains #1 & #3 remain  WBC 21.5>>25.4>>20.8>>18.9>>21.7>>pending  BMW:UXLKG diet.  Mechanically softened is reasonable.  Heart healthy/diabetic  ID: Zosyn 1/9>> continue until leukocytosis has resolved. DVT: Fully anticoagulated given history of stroke and pulmonary embolism.  IV heparin per primary medicine service Follow-up:TBD   Plan:     Continue antibiotics.  Continue drain  care  advance to solid diet.  Diabetic control.  Follow-up CT scan soon.  Will plan for tomorrow, Sunday.  That way if there are concerns of undrained abscesses, percutaneous drainage or adjustment can happen on Monday.  (Hospital Stay = 7 days)     35  minutes spent in review, evaluation, examination, counseling, and coordination of care.  More than 50% of that time was spent in counseling.  11/06/2019    Subjective: (Chief complaint)  Pain less.  Cannot tolerate supplemental shakes as they are too sweet and sugary.  Had loose bowel movements and diarrhea but seems to be slowing down.  Appetite better.  Objective:  Vital signs:  Vitals:   11/05/19 1918 11/05/19 2039 11/05/19 2140 11/06/19 0409  BP: (!) 134/59 (!) 142/66  (!) 165/82  Pulse: 78 77  75  Resp: 18 20  20   Temp: 98.1 F (36.7 C) 98.2 F (36.8 C)  98.3 F (36.8 C)  TempSrc: Oral Oral  Oral  SpO2: 98% 99% 99% 99%  Weight:      Height:        Last BM Date: 11/05/19  Intake/Output   Yesterday:  01/15 0701 - 01/16 0700 In: 2409.1 [P.O.:1653; I.V.:492.8; IV Piggyback:233.4] Out: 230 [Drains:230] This shift:  No intake/output data recorded.  Bowel function:  Flatus: YES  BM:  YES  Drain: Serous   Physical Exam:  General: Pt awake/alert/oriented x4 in no acute distress Eyes: PERRL, normal EOM.  Sclera clear.  No icterus Neuro: CN II-XII intact w/o focal sensory/motor deficits. Lymph: No head/neck/groin lymphadenopathy Psych:  No delerium/psychosis/paranoia HENT: Normocephalic, Mucus membranes moist.  No thrush Neck: Supple, No tracheal deviation Chest: No chest wall pain w good excursion  CV:  Pulses intact.  Regular rhythm MS: Normal AROM mjr joints.  No obvious deformity  Abdomen: Soft.  Nondistended.  Tenderness at Percutaneous drain sites.  No evidence of peritonitis.  No incarcerated hernias.  Ext:   No deformity.  No mjr edema.  No cyanosis Skin: No petechiae /  purpura  Results:   Cultures: Recent Results (from the past 720 hour(s))  Respiratory Panel by RT PCR (Flu A&B, Covid) - Nasopharyngeal Swab     Status: None   Collection Time: 10/30/19  7:25 PM   Specimen: Nasopharyngeal Swab  Result Value Ref Range Status   SARS Coronavirus 2 by RT PCR NEGATIVE NEGATIVE Final    Comment: (NOTE) SARS-CoV-2 target nucleic acids are NOT DETECTED. The SARS-CoV-2 RNA is generally detectable in upper respiratoy specimens during the acute phase of infection. The lowest concentration of SARS-CoV-2 viral copies this assay can detect is 131 copies/mL. A negative result does not preclude SARS-Cov-2 infection and should not be used as the sole basis for treatment or other patient management decisions. A negative result may occur with  improper specimen collection/handling, submission of specimen other than nasopharyngeal swab, presence of viral mutation(s) within the areas targeted by this assay, and inadequate number of viral copies (<131 copies/mL). A negative result must be combined with clinical observations, patient history, and epidemiological information. The expected result is Negative. Fact Sheet for Patients:  PinkCheek.be Fact Sheet for Healthcare Providers:  GravelBags.it This test is not yet ap proved or cleared by the [place] FDA and  has been authorized for detection and/or diagnosis of SARS-CoV-2 by FDA under an Emergency Use Authorization (EUA). This EUA will remain  in effect (meaning this test can be used) for the duration of the COVID-19 declaration under Section 564(b)(1) of the Act, 21 U.S.C. section 360bbb-3(b)(1), unless the authorization is terminated or revoked sooner.    Influenza A by PCR NEGATIVE NEGATIVE Final   Influenza B by PCR NEGATIVE NEGATIVE Final    Comment: (NOTE) The Xpert Xpress SARS-CoV-2/FLU/RSV assay is intended as an aid in  the diagnosis of  influenza from Nasopharyngeal swab specimens and  should not be used as a sole basis for treatment. Nasal washings and  aspirates are unacceptable for Xpert Xpress SARS-CoV-2/FLU/RSV  testing. Fact Sheet for Patients: PinkCheek.be Fact Sheet for Healthcare Providers: GravelBags.it This test is not yet approved or cleared by the [place] FDA and  has been authorized for detection and/or diagnosis of SARS-CoV-2 by  FDA under an Emergency Use Authorization (EUA). This EUA will remain  in effect (meaning this test can be used) for the duration of the  Covid-19 declaration under Section 564(b)(1) of the Act, 21  U.S.C. section 360bbb-3(b)(1), unless the authorization is  terminated or revoked. Performed at [facility], [place] [address], [place], [place] [address]   Culture, blood (routine x 2)     Status: None (Preliminary result)   Collection Time: 10/30/19  8:13 PM   Specimen: BLOOD  Result Value Ref Range Status   Specimen Description   Final    BLOOD LEFT HAND Performed at [place] [address], [place], [place] [address]    Special Requests   Final    BOTTLES DRAWN AEROBIC AND ANAEROBIC Blood Culture adequate volume Performed at [place] [address], [place], [place] [address]    Culture   Final    NO GROWTH 4 DAYS Performed at [facility], [address] [place], [place] [address]    Report Status PENDING  Incomplete  Culture, blood (routine x 2)     Status: None (Preliminary result)   Collection Time: 10/30/19 11:00 PM   Specimen: BLOOD RIGHT HAND  Result Value Ref Range Status   Specimen Description   Final    BLOOD RIGHT HAND Performed at [place] [address] [place], [place] [address]    Special Requests   Final    BOTTLES DRAWN AEROBIC AND ANAEROBIC Blood Culture adequate volume Performed at [facility] [address] [place], [place] [address]    Culture   Final    NO GROWTH 4 DAYS Performed at [facility], [place] [address], [place], [place] [address]    Report Status PENDING  Incomplete  C difficile quick scan w PCR reflex     Status: None   Collection Time: 10/31/19  8:16 AM   Specimen: STOOL  Result Value Ref Range Status   C Diff antigen NEGATIVE NEGATIVE Final   C Diff toxin NEGATIVE NEGATIVE Final   C Diff interpretation No C. difficile detected.  Final    Comment: Performed at [facility], [place] [address] [place], [place] [address]  Aerobic/Anaerobic Culture (surgical/deep wound)     Status: None   Collection Time: 10/31/19  4:11 PM   Specimen: Abscess  Result Value Ref Range Status   Specimen Description   Final    ABSCESS Performed at [facility] [address] [place], [place] [address]    Special Requests   Final    1 LEFT ABDOMINAL LATERAL Performed at [facility], [place] [address] [place], [place] [address]    Gram Stain   Final    ABUNDANT WBC PRESENT, PREDOMINANTLY PMN ABUNDANT GRAM POSITIVE COCCI IN PAIRS IN CLUSTERS IN CHAINS FEW GRAM NEGATIVE COCCOBACILLI RARE GRAM POSITIVE RODS Performed at [facility], [place] [address], [place], [place] [address]    Culture   Final    MODERATE ESCHERICHIA COLI MIXED ANAEROBIC FLORA PRESENT.  CALL LAB IF FURTHER IID REQUIRED.    Report Status 11/04/2019 FINAL  Final   Organism ID, Bacteria ESCHERICHIA COLI  Final      Susceptibility   Escherichia coli - MIC*    AMPICILLIN >=32 RESISTANT Resistant     CEFAZOLIN <=4 SENSITIVE Sensitive     CEFEPIME <=0.12 SENSITIVE Sensitive     CEFTAZIDIME <=1 SENSITIVE Sensitive     CEFTRIAXONE <=0.25 SENSITIVE Sensitive     CIPROFLOXACIN >=4 RESISTANT Resistant     GENTAMICIN >=16 RESISTANT Resistant     IMIPENEM <=0.25 SENSITIVE Sensitive     TRIMETH/SULFA >=320 RESISTANT Resistant      AMPICILLIN/SULBACTAM >=32 RESISTANT Resistant     PIP/TAZO <=4 SENSITIVE Sensitive     * MODERATE ESCHERICHIA COLI  Aerobic/Anaerobic Culture (surgical/deep wound)     Status: None (Preliminary result)   Collection Time: 10/31/19  4:19 PM   Specimen: Abscess  Result Value Ref Range Status   Specimen Description   Final    ABSCESS Performed at [place] [address], [place], [place] [address]    Special Requests   Final    2 LEFT ABDOMEN MEDIAL Performed at [facility], [place] [address] [place], [place] [address]    Gram Stain   Final    FEW WBC PRESENT,BOTH PMN AND MONONUCLEAR ABUNDANT GRAM POSITIVE COCCI  IN PAIRS IN CHAINS RARE GRAM POSITIVE RODS RARE GRAM NEGATIVE RODS    Culture   Final    ABUNDANT ACTINOMYCES NEUII Standardized susceptibility testing for this organism is not available. HOLDING FOR POSSIBLE ANAEROBE Performed at [facility], [place] [address], [place], [place] [address]    Report Status PENDING  Incomplete    Labs: Results for orders placed or performed during the hospital encounter of 10/30/19 (from the past 48 hour(s))  Comprehensive metabolic panel     Status: Abnormal   Collection Time: 11/04/19  7:46 AM  Result Value Ref Range   Sodium 135 135 - 145 mmol/L   Potassium 4.3 3.5 - 5.1 mmol/L   Chloride 104 98 - 111 mmol/L   CO2 24 22 - 32 mmol/L   Glucose, Bld 130 (H) 70 - 99 mg/dL   BUN 21 (H) 6 - 20 mg/dL   Creatinine, Ser 1.04 (H) 0.44 - 1.00 mg/dL   Calcium 8.3 (L) 8.9 - 10.3 mg/dL   Total Protein 6.1 (L) 6.5 - 8.1 g/dL   Albumin 1.9 (L) 3.5 - 5.0 g/dL   AST 25 15 - 41 U/L   ALT 34 0 - 44 U/L   Alkaline Phosphatase 274 (H) 38 - 126 U/L   Total Bilirubin 0.4 0.3 - 1.2 mg/dL   GFR calc non Af Amer >60 >60 mL/min   GFR calc Af Amer >60 >60 mL/min   Anion gap 7 5 - 15    Comment: Performed at [facility], [place] [address], [place], [place] [address]  Magnesium     Status:  None   Collection Time: 11/04/19  7:46 AM  Result Value Ref Range   Magnesium 1.7 1.7 - 2.4 mg/dL    Comment: Performed at [facility], [place] [address], [place], [place] [address]  Phosphorus     Status: None   Collection Time: 11/04/19  7:46 AM  Result Value Ref Range   Phosphorus 3.2 2.5 - 4.6 mg/dL    Comment: Performed at [facility], [place] [address], [place], [place] [address]  Protime-INR     Status: None   Collection Time: 11/04/19  7:46 AM  Result Value Ref Range   Prothrombin Time 15.2 11.4 - 15.2 seconds   INR 1.2 0.8 - 1.2    Comment: (NOTE) INR goal varies based on device and disease states. Performed at [name], [place] [address], [place], [place] [address]   Heparin level (unfractionated)     Status: Abnormal   Collection Time: 11/04/19  7:46 AM  Result Value Ref Range   Heparin Unfractionated 0.28 (L) 0.30 - 0.70 IU/mL    Comment: (NOTE) If heparin results are below expected values, and patient dosage has  been confirmed, suggest follow up testing of antithrombin III levels. Performed at [facility], [place] [address] [place] [address], [place], [place] [address]   CBC     Status: Abnormal   Collection Time: 11/04/19  7:46 AM  Result Value Ref Range   WBC 18.9 (H) 4.0 - 10.5 K/uL   RBC 3.67 (L) 3.87 - 5.11 MIL/uL   Hemoglobin 9.5 (L) 12.0 - 15.0 g/dL   HCT 32.1 (L) 36.0 - 46.0 %   MCV 87.5 80.0 - 100.0 fL   MCH 25.9 (L) 26.0 - 34.0 pg   MCHC 29.6 (L) 30.0 - 36.0 g/dL   RDW 14.9 11.5 - 15.5 %   Platelets 314 150 -  400 K/uL   nRBC 0.0 0.0 - 0.2 %    Comment: Performed at [facility], [place] [address], [place], [place] [address]  Basic metabolic panel     Status: Abnormal   Collection Time: 11/04/19  7:46 AM  Result Value Ref Range   Sodium 135 135 - 145 mmol/L   Potassium 4.2 3.5 - 5.1 mmol/L   Chloride 104 98 - 111 mmol/L   CO2 24 22 - 32 mmol/L   Glucose, Bld 131 (H) 70 - 99  mg/dL   BUN 21 (H) 6 - 20 mg/dL   Creatinine, Ser 0.92 0.44 - 1.00 mg/dL   Calcium 8.2 (L) 8.9 - 10.3 mg/dL   GFR calc non Af Amer >60 >60 mL/min   GFR calc Af Amer >60 >60 mL/min   Anion gap 7 5 - 15    Comment: Performed at [facility], [place] [address], [place], [place] [address]  Glucose, capillary     Status: Abnormal   Collection Time: 11/04/19  8:06 AM  Result Value Ref Range   Glucose-Capillary 113 (H) 70 - 99 mg/dL  Glucose, capillary     Status: Abnormal   Collection Time: 11/04/19 11:35 AM  Result Value Ref Range   Glucose-Capillary 160 (H) 70 - 99 mg/dL  Heparin level (unfractionated)     Status: None   Collection Time: 11/04/19  3:47 PM  Result Value Ref Range   Heparin Unfractionated 0.32 0.30 - 0.70 IU/mL    Comment: (NOTE) If heparin results are below expected values, and patient dosage has  been confirmed, suggest follow up testing of antithrombin III levels. Performed at [facility], [place] [address] [place], [place] [address]   Glucose, capillary     Status: Abnormal   Collection Time: 11/04/19  4:58 PM  Result Value Ref Range   Glucose-Capillary 175 (H) 70 - 99 mg/dL  Glucose, capillary     Status: Abnormal   Collection Time: 11/04/19  8:59 PM  Result Value Ref Range   Glucose-Capillary 120 (H) 70 - 99 mg/dL  Glucose, capillary     Status: Abnormal   Collection Time: 11/05/19 12:28 AM  Result Value Ref Range   Glucose-Capillary 160 (H) 70 - 99 mg/dL  Glucose, capillary     Status: Abnormal   Collection Time: 11/05/19  4:19 AM  Result Value Ref Range   Glucose-Capillary 132 (H) 70 - 99 mg/dL  Comprehensive metabolic panel     Status: Abnormal   Collection Time: 11/05/19  5:25 AM  Result Value Ref Range   Sodium 136 135 - 145 mmol/L   Potassium 4.1 3.5 - 5.1 mmol/L   Chloride 104 98 - 111 mmol/L   CO2 23 22 - 32 mmol/L   Glucose, Bld 135 (H) 70 - 99 mg/dL   BUN 17 6 - 20 mg/dL   Creatinine, Ser 0.92 0.44 - 1.00  mg/dL   Calcium 8.5 (L) 8.9 - 10.3 mg/dL   Total Protein 6.3 (L) 6.5 - 8.1 g/dL   Albumin 2.0 (L) 3.5 - 5.0 g/dL   AST 19 15 - 41 U/L   ALT 25 0 - 44 U/L   Alkaline Phosphatase 268 (H) 38 - 126 U/L   Total Bilirubin 0.3 0.3 - 1.2 mg/dL   GFR calc non Af Amer >60 >60 mL/min   GFR calc Af Amer >60 >60 mL/min   Anion gap 9 5 - 15    Comment: Performed at  [facility], [place] [address] [place], [place] [address]  Magnesium     Status: Abnormal   Collection Time: 11/05/19  5:25 AM  Result Value Ref Range   Magnesium 1.6 (L) 1.7 - 2.4 mg/dL    Comment: Performed at [facility], [place] [address], [place], [place] [address]  Phosphorus     Status: None   Collection Time: 11/05/19  5:25 AM  Result Value Ref Range   Phosphorus 2.8 2.5 - 4.6 mg/dL    Comment: Performed at [facility], [place] [address] [place], [place] [address]  CBC     Status: Abnormal   Collection Time: 11/05/19  5:25 AM  Result Value Ref Range   WBC 21.7 (H) 4.0 - 10.5 K/uL   RBC 3.66 (L) 3.87 - 5.11 MIL/uL   Hemoglobin 9.3 (L) 12.0 - 15.0 g/dL   HCT 32.0 (L) 36.0 - 46.0 %   MCV 87.4 80.0 - 100.0 fL   MCH 25.4 (L) 26.0 - 34.0 pg   MCHC 29.1 (L) 30.0 - 36.0 g/dL   RDW 14.8 11.5 - 15.5 %   Platelets 337 150 - 400 K/uL   nRBC 0.0 0.0 - 0.2 %    Comment: Performed at [name], [place] [address], [place], [place] [address]  Heparin level (unfractionated)     Status: Abnormal   Collection Time: 11/05/19  5:25 AM  Result Value Ref Range   Heparin Unfractionated 0.29 (L) 0.30 - 0.70 IU/mL    Comment: (NOTE) If heparin results are below expected values, and patient dosage has  been confirmed, suggest follow up testing of antithrombin III levels. Performed at [facility], [place] [address], [place], [place] [address]   Glucose, capillary     Status: Abnormal   Collection Time: 11/05/19  8:03 AM  Result Value Ref Range    Glucose-Capillary 117 (H) 70 - 99 mg/dL  Glucose, capillary     Status: Abnormal   Collection Time: 11/05/19 11:43 AM  Result Value Ref Range   Glucose-Capillary 180 (H) 70 - 99 mg/dL  Heparin level (unfractionated)     Status: Abnormal   Collection Time: 11/05/19  3:43 PM  Result Value Ref Range   Heparin Unfractionated 0.20 (L) 0.30 - 0.70 IU/mL    Comment: (NOTE) If heparin results are below expected values, and patient dosage has  been confirmed, suggest follow up testing of antithrombin III levels. Performed at [facility], [place] [address], [place], [place] [address]   Glucose, capillary     Status: Abnormal   Collection Time: 11/05/19  4:29 PM  Result Value Ref Range   Glucose-Capillary 146 (H) 70 - 99 mg/dL  Glucose, capillary     Status: Abnormal   Collection Time: 11/05/19  8:41 PM  Result Value Ref Range   Glucose-Capillary 138 (H) 70 - 99 mg/dL  Heparin level (unfractionated)     Status: None   Collection Time: 11/05/19 10:35 PM  Result Value Ref Range   Heparin Unfractionated 0.44 0.30 - 0.70 IU/mL    Comment: (NOTE) If heparin results are below expected values, and patient dosage has  been confirmed, suggest follow up testing of antithrombin III levels. Performed at [facility], [place] [address] [place], [place] [address]   Glucose, capillary     Status: Abnormal   Collection Time: 11/06/19 12:21 AM  Result Value Ref Range   Glucose-Capillary 149 (H) 70 -  99 mg/dL  Glucose, capillary     Status: Abnormal   Collection Time: 11/06/19  4:11 AM  Result Value Ref Range   Glucose-Capillary 151 (H) 70 - 99 mg/dL    Imaging / Studies: No results found.  Medications / Allergies: per chart  Antibiotics: Anti-infectives (From admission, onward)   Start     Dose/Rate Route Frequency Ordered Stop   10/30/19 2130  piperacillin-tazobactam (ZOSYN) IVPB 3.375 g     3.375 g 12.5 mL/hr over 240 Minutes Intravenous Every 8 hours  10/30/19 2100     10/30/19 2030  piperacillin-tazobactam (ZOSYN) IVPB 2.25 g  Status:  Discontinued     2.25 g 100 mL/hr over 30 Minutes Intravenous Every 6 hours 10/30/19 2024 10/30/19 2100   10/30/19 1930  cefTRIAXone (ROCEPHIN) 2 g in sodium chloride 0.9 % 100 mL IVPB  Status:  Discontinued     2 g 200 mL/hr over 30 Minutes Intravenous  Once 10/30/19 1916 10/30/19 2024   10/30/19 1930  metroNIDAZOLE (FLAGYL) IVPB 500 mg  Status:  Discontinued     500 mg 100 mL/hr over 60 Minutes Intravenous  Once 10/30/19 1916 10/30/19 2024        Note: Portions of this report may have been transcribed using voice recognition software. Every effort was made to ensure accuracy; however, inadvertent computerized transcription errors may be present.   Any transcriptional errors that result from this process are unintentional.     Adin Hector, MD, FACS, MASCRS Gastrointestinal and Minimally Invasive Surgery    [address], [place], [place] [address] [phone] Main / Paging [phone] Fax Please see Amion for pager number, especial 5pm - 7am.

## 2019-11-06 NOTE — Progress Notes (Signed)
Pt. Continues to refuse CPAP.  Will be available if she changes her mind.

## 2019-11-06 NOTE — Progress Notes (Addendum)
ANTICOAGULATION CONSULT NOTE - follow up  Pharmacy Consult for Heparin Indication: H/O PE (on Eliquis PTA) - bridge therapy until oral anticoagulation can resume  Allergies  Allergen Reactions  . Cafergot Other (See Comments)    Chest pain; ergotamine-caffiene  . Glucophage [Metformin Hcl] Anaphylaxis  . Canagliflozin Swelling and Other (See Comments)    Legs Swell invokana Legs Swell    Patient Measurements: Height: 5\' 8"  (172.7 cm) Weight: (!) 387 lb 9.1 oz (175.8 kg) IBW/kg (Calculated) : 63.9 Heparin Dosing Weight: 108 kg  Vital Signs: Temp: 98.3 F (36.8 C) (01/16 0409) Temp Source: Oral (01/16 0409) BP: 165/82 (01/16 0409) Pulse Rate: 75 (01/16 0409)  Labs: Recent Labs    11/04/19 0746 11/04/19 1547 11/05/19 0525 11/05/19 0525 11/05/19 1543 11/05/19 2235 11/06/19 0816  HGB 9.5*  --  9.3*  --   --   --  9.3*  HCT 32.1*  --  32.0*  --   --   --  31.1*  PLT 314  --  337  --   --   --  322  LABPROT 15.2  --   --   --   --   --   --   INR 1.2  --   --   --   --   --   --   HEPARINUNFRC 0.28*   < > 0.29*   < > 0.20* 0.44 0.77*  CREATININE 1.04*  0.92  --  0.92  --   --   --  0.90   < > = values in this interval not displayed.    Estimated Creatinine Clearance: 124 mL/min (by C-G formula based on SCr of 0.9 mg/dL).   Assessment:  55yr female admitted on 10/30/19 with performated sigmoid diverticulitis with abscess.  Patient on Eliquis PTA for h/o PE.  Eliquis held on admission pending potential procedures.  Abscess drains placed 1/10.    Pharmacy consulted to begin IV heparin without bolus for anticoagulation until cleared by general surgery for oral anticoagulation  Today, 11/06/19  Heparin level now supratherapeutic at 0.77 with heparin infusing at 3300 units/hr  CBC: Hg low but stable, pltc WNL  No reported bleeding or infusion problems noted per RN  Goal of Therapy:  Heparin level 0.3-0.7 units/ml Monitor platelets by anticoagulation protocol:  Yes   Plan:   Decrease IV heparin infusion to 3200 units/hr  Recheck heparin level at 1800  Monitor daily CBC  F/u ability to transition back to PTA Eliquis  Netta Cedars, PharmD, BCPS Clinical Pharmacist 11/06/2019 9:49 AM  Addendum- Heparin level @ 1841 now therapeutic on new rate.  No issues noted by nursing.  Will continue current rate & f/u confirmatory level with morning labs.  Netta Cedars, PharmD, BCPS 11/06/2019@7 :19 PM

## 2019-11-06 NOTE — Progress Notes (Signed)
ANTICOAGULATION CONSULT NOTE - Follow Up Consult  Pharmacy Consult for Heparin Indication: H/O PE (on Eliquis PTA) - bridge therapy until oral anticoagulation can resume  Allergies  Allergen Reactions  . Cafergot Other (See Comments)    Chest pain; ergotamine-caffiene  . Glucophage [Metformin Hcl] Anaphylaxis  . Canagliflozin Swelling and Other (See Comments)    Legs Swell invokana Legs Swell    Patient Measurements: Height: 5\' 8"  (172.7 cm) Weight: (!) 387 lb 9.1 oz (175.8 kg) IBW/kg (Calculated) : 63.9 Heparin Dosing Weight:   Vital Signs: Temp: 98.2 F (36.8 C) (01/15 2039) Temp Source: Oral (01/15 2039) BP: 142/66 (01/15 2039) Pulse Rate: 77 (01/15 2039)  Labs: Recent Labs    11/03/19 0618 11/03/19 1451 11/04/19 0746 11/04/19 1547 11/05/19 0525 11/05/19 1543 11/05/19 2235  HGB 9.3*  --  9.5*  --  9.3*  --   --   HCT 31.4*  --  32.1*  --  32.0*  --   --   PLT 309  --  314  --  337  --   --   LABPROT 15.6*  --  15.2  --   --   --   --   INR 1.3*  --  1.2  --   --   --   --   HEPARINUNFRC 0.22*   < > 0.28*   < > 0.29* 0.20* 0.44  CREATININE 1.28*  --  1.04*  0.92  --  0.92  --   --    < > = values in this interval not displayed.    Estimated Creatinine Clearance: 121.4 mL/min (by C-G formula based on SCr of 0.92 mg/dL).   Medications:  Infusions:  . heparin 3,300 Units/hr (11/05/19 1803)  . piperacillin-tazobactam (ZOSYN)  IV 3.375 g (11/05/19 2139)    Assessment: Heparin level at goal.  No heparin issues noted.  Goal of Therapy:  Heparin level 0.3-0.7 units/ml Monitor platelets by anticoagulation protocol: Yes   Plan:  Continue heparin drip at current rate Recheck level at 0800  Tyler Deis, Burns Crowford 11/06/2019,2:43 AM

## 2019-11-06 NOTE — Progress Notes (Signed)
PROGRESS NOTE    Kristen [name]  AJO:[id number] DOB: September 11, 1966 DOA: 10/30/2019 PCP: Lawerance Cruel, MD   Brief Narrative:54 year old Caucasian female with PMH of morbid obesity, type 2 diabetes mellitus, chronic pain on opioids, hypertension, depression, anxiety, asthma, s/p cholecystectomy, CVA with residual vision loss, PCOS, sleep apnea who was brought to the ED by EMS from home for worsening abdominal painassociated with diarrhea. Recently treated for UTI with no improvement in abd pain.[place] home and largely immobile. Reported chronicdepressionand sometimes has suicidal ideations but none now. ED Course: Afebrile,noted to be hypertensive, tachycardic.Found to have leukocytosis of 26K and blood sugar elevated in the 300s. CT abdomen pelvis without contrast (no contrast due to renal dysfunction)showed perforated sigmoid diverticulitis with a large 12 cm abscess in addition to complex fluid collections extending into the patient's pelvis. ED provider spoke to surgeon on call Dr. Gala Lewandowsky who indicated no surgical need at this time and recommended IR consult for drainage of the abscess. Hospital course: [facility] and IR consulted. Patient underwent CT guided drain x3 placement on 1/10. Assessment & Plan:   Principal Problem:   Diverticulitis of large intestine with abscess Active Problems:   Essential hypertension   Asthma   Morbid obesity with BMI of 50.0-59.9, adult (HCC)   Type 2 diabetes mellitus with stage 3 chronic kidney disease (HCC)   Diverticulitis   Abscess of sigmoid colon   History of pulmonary embolus (PE)   AKI (acute kidney injury) ([place])   Hyponatremia   Hyperglycemia   #1 perforated sigmoid diverticulitis with complicated large abscess 12 cm-she has2drains in place. One of the drains were taken out by IR 11/03/2019.  Draining purulent material. C. difficile negative On Zosyn Continue Dilaudid for pain control,she still  believes she needs a Dilaudid for intense pain. Drains placed by IR Followed by surgery WBC19.0 from 21.7 from 18.9  Advance diet per general surgery. CT scan was ordered yesterday and patient refused to have it done till Monday.  #2 AKI on CKD stage III A-creatinine 1.86 on admission peaked to 2.2 and trending down.She received IV fluids.Creatinine 0.90 today.   #3 type 2 diabetes with hyperglycemia on Lantus 60 units at bedtime with sliding scale insulin.  #4 essential hypertension-bp 165/82 restart ace  #5 history of asthma stable continue inhalers and encourage incentive spirometry. Continue Singulair and Dulera.  #6 morbid obesity needs ongoing counseling regarding weight loss  #7 depression continue home meds Zoloft 200 mg daily.  #8 pyuria with rare bacteriuria initially treated for UTI.  #9 thrush-nystatin ordered  #10 history of PE was on Eliquis as an outpatient which is on hold for possible need for surgical intervention.  Continue IV heparin.     Nutrition Problem: Increased nutrient needs Etiology: acute illness     Signs/Symptoms: estimated needs    Interventions: Prostat(Ensure Max)  Estimated body mass index is 58.93 kg/m as calculated from the following:   Height as of this encounter: 5\' 8"  (1.727 m).   Weight as of this encounter: 175.8 kg.  DVT prophylaxis:Heparin Code Status:Full code discussed with patient Family Communication:Discussed with patient  disposition Planpending further clinical improvement patient still on clear liquids and has2drains in place followed by IR and surgery and need for SNF bed Patient still with increasing leukocytosis repeat CT to be done today.  Consultants:  IR and general surgery   Procedures:3 abdominal drains placed by IR  AntimicrobialsZosyn  Subjective: She is resting in bed talking to the  sister over the phone in no distress continues to complain of loose stools with liquid  diet  Objective: Vitals:   11/05/19 2039 11/05/19 2140 11/06/19 0409 11/06/19 0837  BP: (!) 142/66  (!) 165/82   Pulse: 77  75   Resp: 20  20   Temp: 98.2 F (36.8 C)  98.3 F (36.8 C)   TempSrc: Oral  Oral   SpO2: 99% 99% 99% 97%  Weight:      Height:        Intake/Output Summary (Last 24 hours) at 11/06/2019 1241 Last data filed at 11/06/2019 0435 Gross per 24 hour  Intake 1336.14 ml  Output 140 ml  Net 1196.14 ml   Filed Weights   10/31/19 0200  Weight: (!) 175.8 kg    Examination:  General exam: Appears calm and comfortable  Respiratory system: Clear to auscultation. Respiratory effort normal. Cardiovascular system: S1 & S2 heard, RRR. No JVD, murmurs, rubs, gallops or clicks. No pedal edema. Gastrointestinal system: Abdomen is nondistended, soft and nontender. No organomegaly or masses felt. Normal bowel sounds heard.  2 drains in place  Central nervous system: Alert and oriented. No focal neurological deficits. Extremities: Symmetric 5 x 5 power. Skin: No rashes, lesions or ulcers Psychiatry: Judgement and insight appear normal. Mood & affect appropriate.     Data Reviewed: I have personally reviewed following labs and imaging studies  CBC: Recent Labs  Lab 11/02/19 0503 11/03/19 0618 11/04/19 0746 11/05/19 0525 11/06/19 0816  WBC 25.4* 20.8* 18.9* 21.7* 19.0*  HGB 9.8* 9.3* 9.5* 9.3* 9.3*  HCT 32.8* 31.4* 32.1* 32.0* 31.1*  MCV 86.8 88.0 87.5 87.4 87.1  PLT 352 309 314 337 388   Basic Metabolic Panel: Recent Labs  Lab 11/02/19 0503 11/03/19 0618 11/04/19 0746 11/05/19 0525 11/06/19 0816  NA 136 135 135  135 136 136  K 4.3 4.3 4.3  4.2 4.1 4.4  CL 105 105 104  104 104 104  CO2 22 22 24  24 23 26   GLUCOSE 115* 94 130*  131* 135* 129*  BUN 36* 28* 21*  21* 17 14  CREATININE 1.56* 1.28* 1.04*  0.92 0.92 0.90  CALCIUM 8.3* 8.2* 8.3*  8.2* 8.5* 8.4*  MG 1.8 1.9 1.7 1.6* 1.7  PHOS 4.4 4.2 3.2 2.8 3.2   GFR: Estimated Creatinine  Clearance: 124 mL/min (by C-G formula based on SCr of 0.9 mg/dL). Liver Function Tests: Recent Labs  Lab 11/01/19 0526 11/02/19 0503 11/03/19 0618 11/04/19 0746 11/05/19 0525  AST 23 54* 31 25 19   ALT 25 43 33 34 25  ALKPHOS 83 178* 201* 274* 268*  BILITOT 0.6 0.7 0.3 0.4 0.3  PROT 6.1* 6.4* 6.1* 6.1* 6.3*  ALBUMIN 2.2* 2.1* 1.9* 1.9* 2.0*   Recent Labs  Lab 10/30/19 1748  LIPASE 23   No results for input(s): AMMONIA in the last 168 hours. Coagulation Profile: Recent Labs  Lab 10/31/19 1056 11/01/19 0526 11/02/19 0503 11/03/19 0618 11/04/19 0746  INR 1.4* 1.4* 1.3* 1.3* 1.2   Cardiac Enzymes: No results for input(s): CKTOTAL, CKMB, CKMBINDEX, TROPONINI in the last 168 hours. BNP (last 3 results) No results for input(s): PROBNP in the last 8760 hours. HbA1C: No results for input(s): HGBA1C in the last 72 hours. CBG: Recent Labs  Lab 11/05/19 2041 11/06/19 0021 11/06/19 0411 11/06/19 0748 11/06/19 1142  GLUCAP 138* 149* 151* 114* 147*   Lipid Profile: No results for input(s): CHOL, HDL, LDLCALC, TRIG, CHOLHDL, LDLDIRECT in the last 72  hours. Thyroid Function Tests: No results for input(s): TSH, T4TOTAL, FREET4, T3FREE, THYROIDAB in the last 72 hours. Anemia Panel: No results for input(s): VITAMINB12, FOLATE, FERRITIN, TIBC, IRON, RETICCTPCT in the last 72 hours. Sepsis Labs: Recent Labs  Lab 10/30/19 1916 10/31/19 0558  LATICACIDVEN 1.3 1.4    Recent Results (from the past 240 hour(s))  Respiratory Panel by RT PCR (Flu A&B, Covid) - Nasopharyngeal Swab     Status: None   Collection Time: 10/30/19  7:25 PM   Specimen: Nasopharyngeal Swab  Result Value Ref Range Status   SARS Coronavirus 2 by RT PCR NEGATIVE NEGATIVE Final    Comment: (NOTE) SARS-CoV-2 target nucleic acids are NOT DETECTED. The SARS-CoV-2 RNA is generally detectable in upper respiratoy specimens during the acute phase of infection. The lowest concentration of SARS-CoV-2 viral copies  this assay can detect is 131 copies/mL. A negative result does not preclude SARS-Cov-2 infection and should not be used as the sole basis for treatment or other patient management decisions. A negative result may occur with  improper specimen collection/handling, submission of specimen other than nasopharyngeal swab, presence of viral mutation(s) within the areas targeted by this assay, and inadequate number of viral copies (<131 copies/mL). A negative result must be combined with clinical observations, patient history, and epidemiological information. The expected result is Negative. Fact Sheet for Patients:  PinkCheek.be Fact Sheet for Healthcare Providers:  GravelBags.it This test is not yet ap proved or cleared by the [place] FDA and  has been authorized for detection and/or diagnosis of SARS-CoV-2 by FDA under an Emergency Use Authorization (EUA). This EUA will remain  in effect (meaning this test can be used) for the duration of the COVID-19 declaration under Section 564(b)(1) of the Act, 21 U.S.C. section 360bbb-3(b)(1), unless the authorization is terminated or revoked sooner.    Influenza A by PCR NEGATIVE NEGATIVE Final   Influenza B by PCR NEGATIVE NEGATIVE Final    Comment: (NOTE) The Xpert Xpress SARS-CoV-2/FLU/RSV assay is intended as an aid in  the diagnosis of influenza from Nasopharyngeal swab specimens and  should not be used as a sole basis for treatment. Nasal washings and  aspirates are unacceptable for Xpert Xpress SARS-CoV-2/FLU/RSV  testing. Fact Sheet for Patients: PinkCheek.be Fact Sheet for Healthcare Providers: GravelBags.it This test is not yet approved or cleared by the [place] FDA and  has been authorized for detection and/or diagnosis of SARS-CoV-2 by  FDA under an Emergency Use Authorization (EUA). This EUA will remain  in  effect (meaning this test can be used) for the duration of the  Covid-19 declaration under Section 564(b)(1) of the Act, 21  U.S.C. section 360bbb-3(b)(1), unless the authorization is  terminated or revoked. Performed at [facility], [place] [address] [place], [place] [address]   Culture, blood (routine x 2)     Status: None (Preliminary result)   Collection Time: 10/30/19  8:13 PM   Specimen: BLOOD  Result Value Ref Range Status   Specimen Description   Final    BLOOD LEFT HAND Performed at [place] [address], [place], [place] [address]    Special Requests   Final    BOTTLES DRAWN AEROBIC AND ANAEROBIC Blood Culture adequate volume Performed at [place] [address] [place], [place] [address]    Culture   Final    NO GROWTH 4 DAYS Performed at [facility], [place] [address], [place], [place] [address]  Report Status PENDING  Incomplete  Culture, blood (routine x 2)     Status: None (Preliminary result)   Collection Time: 10/30/19 11:00 PM   Specimen: BLOOD RIGHT HAND  Result Value Ref Range Status   Specimen Description   Final    BLOOD RIGHT HAND Performed at [place] [address], [place], [place] [address]    Special Requests   Final    BOTTLES DRAWN AEROBIC AND ANAEROBIC Blood Culture adequate volume Performed at [place] [address] [place], [place] [address]    Culture   Final    NO GROWTH 4 DAYS Performed at [facility], [place] [address], [place], [place] [address]    Report Status PENDING  Incomplete  C difficile quick scan w PCR reflex     Status: None   Collection Time: 10/31/19  8:16 AM   Specimen: STOOL  Result Value Ref Range Status   C Diff antigen NEGATIVE NEGATIVE Final   C Diff toxin NEGATIVE NEGATIVE Final   C Diff interpretation No C. difficile detected.  Final    Comment: Performed at [facility], [place] [address] [place], [place] [address]  Aerobic/Anaerobic Culture (surgical/deep wound)     Status: None   Collection Time: 10/31/19  4:11 PM   Specimen: Abscess  Result Value Ref Range Status   Specimen Description   Final    ABSCESS Performed at [place] [address] [place], [place] [address]    Special Requests   Final    1 LEFT ABDOMINAL LATERAL Performed at [facility], [place] [address], [place], [place] [address]    Gram Stain   Final    ABUNDANT WBC PRESENT, PREDOMINANTLY PMN ABUNDANT GRAM POSITIVE COCCI IN PAIRS IN CLUSTERS IN CHAINS FEW GRAM NEGATIVE COCCOBACILLI RARE GRAM POSITIVE RODS Performed at [facility], [place] [address] [place], [place] [address]    Culture   Final    MODERATE ESCHERICHIA COLI MIXED ANAEROBIC FLORA PRESENT.  CALL LAB IF FURTHER IID REQUIRED.    Report Status 11/04/2019 FINAL  Final   Organism ID, Bacteria ESCHERICHIA COLI  Final      Susceptibility   Escherichia coli - MIC*    AMPICILLIN >=32 RESISTANT Resistant     CEFAZOLIN <=4 SENSITIVE Sensitive     CEFEPIME <=0.12 SENSITIVE Sensitive     CEFTAZIDIME <=1 SENSITIVE Sensitive     CEFTRIAXONE <=0.25 SENSITIVE Sensitive     CIPROFLOXACIN >=4 RESISTANT Resistant     GENTAMICIN >=16 RESISTANT Resistant     IMIPENEM <=0.25 SENSITIVE Sensitive     TRIMETH/SULFA >=320 RESISTANT Resistant     AMPICILLIN/SULBACTAM >=32 RESISTANT Resistant     PIP/TAZO <=4 SENSITIVE Sensitive     * MODERATE ESCHERICHIA COLI  Aerobic/Anaerobic Culture (surgical/deep wound)     Status: None (Preliminary result)   Collection Time: 10/31/19  4:19 PM   Specimen: Abscess  Result Value Ref Range Status   Specimen Description   Final    ABSCESS Performed at [place] [address] [place],  [address]    Special Requests   Final    2 LEFT ABDOMEN MEDIAL Performed at [facility], [place] [address] [place], [place] [address]    Gram Stain   Final    FEW WBC PRESENT,BOTH PMN AND MONONUCLEAR ABUNDANT GRAM POSITIVE COCCI IN PAIRS IN CHAINS RARE GRAM POSITIVE RODS RARE  GRAM NEGATIVE RODS    Culture   Final    ABUNDANT ACTINOMYCES NEUII Standardized susceptibility testing for this organism is not available. HOLDING FOR POSSIBLE ANAEROBE Performed at [facility], [place] [address], [place], [place] [address]    Report Status PENDING  Incomplete         Radiology Studies: No results found.      Scheduled Meds: . atorvastatin  40 mg Oral Daily  . cholecalciferol  2,000 Units Oral Daily  . feeding supplement (PRO-STAT SUGAR FREE 64)  30 mL Oral BID  . insulin aspart  0-20 Units Subcutaneous Q4H  . insulin aspart  10 Units Subcutaneous TID WC  . insulin glargine  60 Units Subcutaneous QHS  . lip balm  1 application Topical BID  . mometasone-formoterol  2 puff Inhalation BID  . montelukast  10 mg Oral QHS  . nystatin  5 mL Oral QID  . polyethylene glycol  17 g Oral Daily  . Ensure Max Protein  11 oz Oral BID  . saccharomyces boulardii  250 mg Oral BID  . sertraline  200 mg Oral Daily  . sodium chloride flush  3 mL Intravenous Q12H  . sodium chloride flush  3 mL Intravenous Q12H  . sodium chloride flush  5 mL Intracatheter Q8H  . sodium chloride flush  5 mL Intracatheter Q8H   Continuous Infusions: . sodium chloride    . heparin 3,200 Units/hr (11/06/19 1234)  . lactated ringers    . methocarbamol (ROBAXIN) IV    . piperacillin-tazobactam (ZOSYN)  IV 3.375 g (11/06/19 0434)     LOS: 7 days     Georgette Shell, MD [facility]  If 7PM-7AM, please contact night-coverage [email or web address] Password [facility] 11/06/2019, 12:41 PM

## 2019-11-07 ENCOUNTER — Inpatient Hospital Stay (HOSPITAL_COMMUNITY): Payer: PPO

## 2019-11-07 DIAGNOSIS — Z221 Carrier of other intestinal infectious diseases: Secondary | ICD-10-CM

## 2019-11-07 DIAGNOSIS — Z1630 Resistance to unspecified antimicrobial drugs: Secondary | ICD-10-CM

## 2019-11-07 LAB — CULTURE, BLOOD (ROUTINE X 2)
Culture: NO GROWTH
Culture: NO GROWTH
Special Requests: ADEQUATE
Special Requests: ADEQUATE

## 2019-11-07 LAB — HEPARIN LEVEL (UNFRACTIONATED): Heparin Unfractionated: 0.36 IU/mL (ref 0.30–0.70)

## 2019-11-07 LAB — CBC
HCT: 30.5 % — ABNORMAL LOW (ref 36.0–46.0)
Hemoglobin: 8.9 g/dL — ABNORMAL LOW (ref 12.0–15.0)
MCH: 25.6 pg — ABNORMAL LOW (ref 26.0–34.0)
MCHC: 29.2 g/dL — ABNORMAL LOW (ref 30.0–36.0)
MCV: 87.9 fL (ref 80.0–100.0)
Platelets: 333 10*3/uL (ref 150–400)
RBC: 3.47 MIL/uL — ABNORMAL LOW (ref 3.87–5.11)
RDW: 15.2 % (ref 11.5–15.5)
WBC: 15.2 10*3/uL — ABNORMAL HIGH (ref 4.0–10.5)
nRBC: 0 % (ref 0.0–0.2)

## 2019-11-07 LAB — GLUCOSE, CAPILLARY
Glucose-Capillary: 122 mg/dL — ABNORMAL HIGH (ref 70–99)
Glucose-Capillary: 124 mg/dL — ABNORMAL HIGH (ref 70–99)
Glucose-Capillary: 140 mg/dL — ABNORMAL HIGH (ref 70–99)
Glucose-Capillary: 147 mg/dL — ABNORMAL HIGH (ref 70–99)
Glucose-Capillary: 168 mg/dL — ABNORMAL HIGH (ref 70–99)
Glucose-Capillary: 179 mg/dL — ABNORMAL HIGH (ref 70–99)
Glucose-Capillary: 186 mg/dL — ABNORMAL HIGH (ref 70–99)

## 2019-11-07 LAB — PHOSPHORUS: Phosphorus: 3.2 mg/dL (ref 2.5–4.6)

## 2019-11-07 LAB — MAGNESIUM: Magnesium: 1.7 mg/dL (ref 1.7–2.4)

## 2019-11-07 MED ORDER — ONDANSETRON HCL 4 MG/2ML IJ SOLN
4.0000 mg | INTRAMUSCULAR | Status: DC | PRN
  Administered 2019-11-07 – 2019-11-12 (×18): 4 mg via INTRAVENOUS
  Filled 2019-11-07 (×18): qty 2

## 2019-11-07 NOTE — Progress Notes (Signed)
ANTICOAGULATION CONSULT NOTE - follow up  Pharmacy Consult for Heparin Indication: H/O PE (on Eliquis PTA) - bridge therapy until oral anticoagulation can resume  Allergies  Allergen Reactions  . Cafergot Other (See Comments)    Chest pain; ergotamine-caffiene  . Glucophage [Metformin Hcl] Anaphylaxis  . Canagliflozin Swelling and Other (See Comments)    Legs Swell invokana Legs Swell    Patient Measurements: Height: 5\' 8"  (172.7 cm) Weight: (!) 387 lb 9.1 oz (175.8 kg) IBW/kg (Calculated) : 63.9 Heparin Dosing Weight: 108 kg  Vital Signs: Temp: 97.8 F (36.6 C) (01/17 0400) Temp Source: Oral (01/17 0400) BP: 111/62 (01/17 0400) Pulse Rate: 78 (01/17 0400)  Labs: Recent Labs    11/05/19 0525 11/05/19 1543 11/06/19 0816 11/06/19 1841 11/07/19 0550  HGB 9.3*  --  9.3*  --  8.9*  HCT 32.0*  --  31.1*  --  30.5*  PLT 337  --  322  --  333  HEPARINUNFRC 0.29*   < > 0.77* 0.58 0.36  CREATININE 0.92  --  0.90  --   --    < > = values in this interval not displayed.    Estimated Creatinine Clearance: 124 mL/min (by C-G formula based on SCr of 0.9 mg/dL).   Assessment:  70yr female admitted on 10/30/19 with performated sigmoid diverticulitis with abscess.  Patient on Eliquis PTA for h/o PE.  Eliquis held on admission pending potential procedures.  Abscess drains placed 1/10.    Pharmacy consulted to begin IV heparin without bolus for anticoagulation until cleared by general surgery for oral anticoagulation  Today, 11/07/19  Heparin level remains therapeutic at 0.36 with heparin infusing at 3200 units/hr  CBC: Hg low -8.9, pltc WNL  No reported bleeding or infusion problems noted per RN  Goal of Therapy:  Heparin level 0.3-0.7 units/ml Monitor platelets by anticoagulation protocol: Yes   Plan:   Continue IV heparin infusion at 3200 units/hr  Monitor daily heparin level & CBC  [name]  Kristen [name], PharmD, BCPS Clinical Pharmacist 11/07/2019 8:07 AM

## 2019-11-07 NOTE — Progress Notes (Signed)
PROGRESS NOTE    Kristen [name]  ZSW:[id number] DOB: 08-12-1966 DOA: 10/30/2019 PCP: Lawerance Cruel, MD    Brief Narrative: 54 year old Caucasian female with PMH of morbid obesity, type 2 diabetes mellitus, chronic pain on opioids, hypertension, depression, anxiety, asthma, s/p cholecystectomy, CVA with residual vision loss, PCOS, sleep apnea who was brought to the ED by EMS from home for worsening abdominal painassociated with diarrhea. Recently treated for UTI with no improvement in abd pain.[place] home and largely immobile. Reported chronicdepressionand sometimes has suicidal ideations but none now. ED Course: Afebrile,noted to be hypertensive, tachycardic.Found to have leukocytosis of 26K and blood sugar elevated in the 300s. CT abdomen pelvis without contrast (no contrast due to renal dysfunction)showed perforated sigmoid diverticulitis with a large 12 cm abscess in addition to complex fluid collections extending into the patient's pelvis. ED provider spoke to surgeon on call Dr. Gala Lewandowsky who indicated no surgical need at this time and recommended IR consult for drainage of the abscess. Hospital course: [facility] and IR consulted. Patient underwent CT guided drain x3 placement on 1/10.  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 One of the drains were taken out by IR 11/03/2019.Draining purulent material. C. difficile negative On  Zosyn Continue Dilaudid for pain control,she still believes she needs a Dilaudid for intense pain. Drains placed by IR Followed by surgery WBCdown to 15.2 from 19.0.  Advance diet per general surgery. CT scan was ordered for today.  #2 AKI on CKD stage III A-resolved with IV fluids.  Last creatinine we have is 0.90.    #3 type 2 diabetes with hyperglycemia on Lantus 60 units at bedtime with sliding scale insulin.  Blood sugar ranges from 1 86-1 47-1 24 and 140.  Continue Lantus 60 units at bedtime with SSI.  #4 essential hypertension-blood pressure 111/62 down from 165/82 after restarting ACE inhibitor.     #5 history of asthma stable continue inhalers and encourage incentive spirometry. Continue Singulair and Dulera.  #6 morbid obesity needs ongoing counseling regarding weight loss  #7 depression continue home meds Zoloft 200 mg daily.  #8 pyuria with rare bacteriuria initially treated for UTI.  #9 thrush-nystatin ordered   #10 history of PE was on Eliquis as an outpatient which is on hold for possible need for surgical intervention.  Continue IV heparin.   Nutrition Problem: Increased nutrient needs Etiology: acute illness     Signs/Symptoms: estimated needs    Interventions: Prostat(Ensure Max)  Estimated body mass index is 58.93 kg/m as calculated from the following:   Height as of this encounter: 5\' 8"  (1.727 m).   Weight as of this encounter: 175.8 kg.   DVT prophylaxis:Heparin Code Status:Full code discussed with patient Family Communication:Discussed with patient  disposition Planpending further clinical improvement patient still on clear liquids and has2drains in place followed by IR and surgery and need for SNF bed Patient still with increasing leukocytosis repeat CT to be done today.  Consultants:  IR and general surgery   Procedures:3 abdominal drains placed by IR  AntimicrobialsZosyn    Subjective: Still nauseous  on full liquid diet no vomiting but continues to be nauseous having bowel movements diarrhea better Objective: Vitals:   11/06/19 2005 11/06/19 2012 11/07/19 0400 11/07/19 0804  BP:  (!) 145/69 111/62   Pulse:  81 78   Resp:  20 20   Temp:  98.7 F (37.1 C) 97.8 F (36.6 C)   TempSrc:  Oral Oral   SpO2: 98% 98% 98% 95%  Weight:      Height:        Intake/Output Summary (Last 24 hours) at 11/07/2019 1302 Last data filed at 11/07/2019 5102 Gross per 24 hour  Intake 880 ml  Output 1270 ml  Net -390 ml   Filed Weights   10/31/19 0200  Weight: (!) 175.8 kg    Examination:  General exam: Appears calm and comfortable  Respiratory system: Clear to auscultation. Respiratory effort normal. Cardiovascular system: S1 & S2 heard, RRR. No JVD, murmurs, rubs, gallops or clicks. No pedal edema. Gastrointestinal system: Abdomen is nondistended, soft and nontender. No organomegaly or masses felt. Normal bowel sounds heard.  2 drains in place 1 on the left and 1 on the right side of the abdomen.  The right side of the drain is always draining more than the left.  The drainage on the right is yellow in color and drainage on the left is blood-tinged and mild yellow. Central nervous system: Alert and oriented. No focal neurological deficits. Extremities: Symmetric 5 x 5 power. Skin: No rashes, lesions or ulcers Psychiatry: Judgement and insight appear normal. Mood & affect appropriate.     Data Reviewed: I have personally reviewed following labs and imaging studies  CBC: Recent Labs  Lab 11/03/19 0618 11/04/19 0746 11/05/19 0525 11/06/19 0816 11/07/19 0550  WBC 20.8* 18.9* 21.7* 19.0* 15.2*  HGB 9.3* 9.5* 9.3* 9.3* 8.9*  HCT 31.4* 32.1* 32.0* 31.1* 30.5*  MCV 88.0 87.5 87.4 87.1 87.9  PLT 309 314 337 322 585   Basic Metabolic Panel: Recent Labs  Lab 11/02/19 0503 11/02/19 0503 11/03/19 0618 11/04/19 0746 11/05/19 0525 11/06/19 0816 11/07/19 0550  NA 136  --  135 135  135  136 136  --   K 4.3  --  4.3 4.3  4.2 4.1 4.4  --   CL 105  --  105 104  104 104 104  --   CO2 22  --  22 24  24 23 26   --   GLUCOSE 115*  --  94 130*  131* 135* 129*  --   BUN 36*  --  28* 21*  21* 17 14  --   CREATININE 1.56*  --  1.28* 1.04*  0.92 0.92 0.90  --   CALCIUM 8.3*  --  8.2* 8.3*  8.2* 8.5* 8.4*  --   MG 1.8   < > 1.9 1.7 1.6* 1.7 1.7  PHOS 4.4   < > 4.2 3.2 2.8 3.2 3.2   < > = values in this interval not displayed.   GFR: Estimated Creatinine Clearance: 124 mL/min (by C-G formula based on SCr of 0.9 mg/dL). Liver Function Tests: Recent Labs  Lab 11/01/19 0526 11/02/19 0503 11/03/19 0618 11/04/19 0746 11/05/19 0525  AST 23 54* 31 25 19   ALT 25 43 33 34 25  ALKPHOS 83 178* 201* 274* 268*  BILITOT 0.6 0.7 0.3 0.4 0.3  PROT 6.1* 6.4* 6.1*  6.1* 6.3*  ALBUMIN 2.2* 2.1* 1.9* 1.9* 2.0*   No results for input(s): LIPASE, AMYLASE in the last 168 hours. No results for input(s): AMMONIA in the last 168 hours. Coagulation Profile: Recent Labs  Lab 11/01/19 0526 11/02/19 0503 11/03/19 0618 11/04/19 0746  INR 1.4* 1.3* 1.3* 1.2   Cardiac Enzymes: No results for input(s): CKTOTAL, CKMB, CKMBINDEX, TROPONINI in the last 168 hours. BNP (last 3 results) No results for input(s): PROBNP in the last 8760 hours. HbA1C: No results for input(s): HGBA1C in the last 72 hours. CBG: Recent Labs  Lab 11/06/19 2014 11/07/19 0012 11/07/19 0403 11/07/19 0747 11/07/19 1205  GLUCAP 186* 186* 147* 124* 140*   Lipid Profile: No results for input(s): CHOL, HDL, LDLCALC, TRIG, CHOLHDL, LDLDIRECT in the last 72 hours. Thyroid Function Tests: No results for input(s): TSH, T4TOTAL, FREET4, T3FREE, THYROIDAB in the last 72 hours. Anemia Panel: No results for input(s): VITAMINB12, FOLATE, FERRITIN, TIBC, IRON, RETICCTPCT in the last 72 hours. Sepsis Labs: No results for input(s): PROCALCITON, LATICACIDVEN in the last 168 hours.  Recent Results (from the past 240 hour(s))   Respiratory Panel by RT PCR (Flu A&B, Covid) - Nasopharyngeal Swab     Status: None   Collection Time: 10/30/19  7:25 PM   Specimen: Nasopharyngeal Swab  Result Value Ref Range Status   SARS Coronavirus 2 by RT PCR NEGATIVE NEGATIVE Final    Comment: (NOTE) SARS-CoV-2 target nucleic acids are NOT DETECTED. The SARS-CoV-2 RNA is generally detectable in upper respiratoy specimens during the acute phase of infection. The lowest concentration of SARS-CoV-2 viral copies this assay can detect is 131 copies/mL. A negative result does not preclude SARS-Cov-2 infection and should not be used as the sole basis for treatment or other patient management decisions. A negative result may occur with  improper specimen collection/handling, submission of specimen other than nasopharyngeal swab, presence of viral mutation(s) within the areas targeted by this assay, and inadequate number of viral copies (<131 copies/mL). A negative result must be combined with clinical observations, patient history, and epidemiological information. The expected result is Negative. Fact Sheet for Patients:  PinkCheek.be Fact Sheet for Healthcare Providers:  GravelBags.it This test is not yet ap proved or cleared by the [place] FDA and  has been authorized for detection and/or diagnosis of SARS-CoV-2 by FDA under an Emergency Use Authorization (EUA). This EUA will remain  in effect (meaning this test can be used) for the duration of the COVID-19 declaration under Section 564(b)(1) of the Act, 21 U.S.C. section 360bbb-3(b)(1), unless the authorization is terminated or revoked sooner.    Influenza A by PCR NEGATIVE NEGATIVE Final   Influenza B by PCR NEGATIVE NEGATIVE Final    Comment: (NOTE) The Xpert Xpress SARS-CoV-2/FLU/RSV assay is intended as an aid in  the diagnosis of influenza from Nasopharyngeal swab specimens and  should not be used as a sole  basis for treatment. Nasal washings and  aspirates are unacceptable for Xpert Xpress SARS-CoV-2/FLU/RSV  testing. Fact Sheet for Patients: PinkCheek.be Fact Sheet for Healthcare Providers: GravelBags.it This test is not yet approved or cleared by the [place] FDA and  has been authorized for detection and/or diagnosis of SARS-CoV-2 by  FDA under an Emergency Use Authorization (EUA). This EUA will remain  in effect (meaning this test can be used) for the duration of the  Covid-19 declaration under Section 564(b)(1) of the Act, 21  U.S.C. section 360bbb-3(b)(1), unless the authorization is  terminated or revoked. Performed at  [facility], [place] [address], [place], [place] [address]   Culture, blood (routine x 2)     Status: None (Preliminary result)   Collection Time: 10/30/19  8:13 PM   Specimen: BLOOD  Result Value Ref Range Status   Specimen Description   Final    BLOOD LEFT HAND Performed at [place] [address], [place], [place] [address]    Special Requests   Final    BOTTLES DRAWN AEROBIC AND ANAEROBIC Blood Culture adequate volume Performed at [place] [address], [place], [place] [address]    Culture   Final    NO GROWTH 4 DAYS Performed at [facility], [place] [address] [place], [place] [address]    Report Status PENDING  Incomplete  Culture, blood (routine x 2)     Status: None (Preliminary result)   Collection Time: 10/30/19 11:00 PM   Specimen: BLOOD RIGHT HAND  Result Value Ref Range Status   Specimen Description   Final    BLOOD RIGHT HAND Performed at [place] [address] [place], [place] [address]    Special Requests   Final    BOTTLES DRAWN AEROBIC AND ANAEROBIC Blood Culture adequate volume Performed at [place] [address] [place], [place] [address]    Culture    Final    NO GROWTH 4 DAYS Performed at [facility], [place] [address], [place], [place] [address]    Report Status PENDING  Incomplete  C difficile quick scan w PCR reflex     Status: None   Collection Time: 10/31/19  8:16 AM   Specimen: STOOL  Result Value Ref Range Status   C Diff antigen NEGATIVE NEGATIVE Final   C Diff toxin NEGATIVE NEGATIVE Final   C Diff interpretation No C. difficile detected.  Final    Comment: Performed at [facility], [place] [address] [place], [place] [address]  Aerobic/Anaerobic Culture (surgical/deep wound)     Status: None   Collection Time: 10/31/19  4:11 PM   Specimen: Abscess  Result Value Ref Range Status   Specimen Description   Final    ABSCESS Performed at [place] [address], [place], [place] [address]    Special Requests   Final    1 LEFT ABDOMINAL LATERAL Performed at [facility], [place] [address], [place], [place] [address]    Gram Stain   Final    ABUNDANT WBC PRESENT, PREDOMINANTLY PMN ABUNDANT GRAM POSITIVE COCCI IN PAIRS IN CLUSTERS IN CHAINS FEW GRAM NEGATIVE COCCOBACILLI RARE GRAM POSITIVE RODS Performed at [facility], [place] [address], [place], [place] [address]    Culture   Final    MODERATE ESCHERICHIA COLI MIXED ANAEROBIC FLORA PRESENT.  CALL LAB IF FURTHER IID REQUIRED.    Report Status 11/04/2019 FINAL  Final   Organism ID, Bacteria ESCHERICHIA COLI  Final      Susceptibility   Escherichia coli - MIC*    AMPICILLIN >=32 RESISTANT Resistant     CEFAZOLIN <=4 SENSITIVE Sensitive     CEFEPIME <=0.12 SENSITIVE Sensitive     CEFTAZIDIME <=1 SENSITIVE Sensitive     CEFTRIAXONE <=0.25 SENSITIVE Sensitive     CIPROFLOXACIN >=4 RESISTANT Resistant     GENTAMICIN >=16 RESISTANT Resistant     IMIPENEM <=0.25 SENSITIVE Sensitive     TRIMETH/SULFA >=320 RESISTANT Resistant     AMPICILLIN/SULBACTAM >=  32 RESISTANT Resistant     PIP/TAZO <=4 SENSITIVE  Sensitive     * MODERATE ESCHERICHIA COLI  Aerobic/Anaerobic Culture (surgical/deep wound)     Status: None (Preliminary result)   Collection Time: 10/31/19  4:19 PM   Specimen: Abscess  Result Value Ref Range Status   Specimen Description   Final    ABSCESS Performed at [place] [address], [place], [place] [address]    Special Requests   Final    2 LEFT ABDOMEN MEDIAL Performed at [facility], [place] [address], [place], [place] [address]    Gram Stain   Final    FEW WBC PRESENT,BOTH PMN AND MONONUCLEAR ABUNDANT GRAM POSITIVE COCCI IN PAIRS IN CHAINS RARE GRAM POSITIVE RODS RARE GRAM NEGATIVE RODS    Culture   Final    ABUNDANT ACTINOMYCES NEUII Standardized susceptibility testing for this organism is not available. HOLDING FOR POSSIBLE ANAEROBE Performed at [facility], [place] [address] [place], [place] [address]    Report Status PENDING  Incomplete         Radiology Studies: No results found.      Scheduled Meds: . atorvastatin  40 mg Oral Daily  . cholecalciferol  2,000 Units Oral Daily  . feeding supplement (PRO-STAT SUGAR FREE 64)  30 mL Oral BID  . insulin aspart  0-20 Units Subcutaneous Q4H  . insulin aspart  10 Units Subcutaneous TID WC  . insulin glargine  60 Units Subcutaneous QHS  . lip balm  1 application Topical BID  . mometasone-formoterol  2 puff Inhalation BID  . montelukast  10 mg Oral QHS  . nystatin  5 mL Oral QID  . polyethylene glycol  17 g Oral Daily  . Ensure Max Protein  11 oz Oral BID  . saccharomyces boulardii  250 mg Oral BID  . sertraline  200 mg Oral Daily  . sodium chloride flush  3 mL Intravenous Q12H  . sodium chloride flush  3 mL Intravenous Q12H  . sodium chloride flush  5 mL Intracatheter Q8H  . sodium chloride flush  5 mL Intracatheter Q8H   Continuous Infusions: . sodium chloride    . heparin 3,200 Units/hr (11/07/19 0713)  . lactated ringers    . methocarbamol  (ROBAXIN) IV    . piperacillin-tazobactam (ZOSYN)  IV 3.375 g (11/07/19 0606)     LOS: 8 days     Georgette Shell, MD [facility]  If 7PM-7AM, please contact night-coverage [email or web address] Password [email or web address] 11/07/2019, 1:02 PM

## 2019-11-07 NOTE — Progress Notes (Addendum)
Referring Physician(s): Armandina Gemma  Supervising Physician: Arne Cleveland  Patient Status:  [facility] - In-pt  Chief Complaint: Intra-abdominal fluid collections  Subjective: Eating lunch at time of visit. No complaints.  States her abdominal pain is overall unchanged.   Allergies: Cafergot, Glucophage [metformin hcl], and Canagliflozin  Medications: Prior to Admission medications   Medication Sig Start Date End Date Taking? Authorizing Provider  acetaminophen (TYLENOL) 500 MG tablet Take 1,000 mg by mouth every 6 (six) hours as needed for moderate pain.   Yes [provider]  albuterol (ACCUNEB) 1.25 MG/3ML nebulizer solution Inhale 1 ampule into the lungs 3 (three) times daily as needed for wheezing or shortness of breath.  12/12/10  Yes [provider]  albuterol (VENTOLIN HFA) 108 (90 Base) MCG/ACT inhaler Inhale 2 puffs into the lungs every 4 (four) hours as needed for wheezing or shortness of breath.  01/04/14  Yes [provider]  amoxicillin-clavulanate (AUGMENTIN) 875-125 MG tablet Take 1 tablet by mouth 2 (two) times daily. 10/25/19  Yes [provider]  apixaban (ELIQUIS) 5 MG TABS tablet Take two tabs twice a day for 7 days, then take one tab twice a day for a year. Patient taking differently: Take 2.5 mg by mouth 2 (two) times daily.  03/24/17  Yes Florencia Reasons, MD  aspirin EC 81 MG tablet Take 81 mg by mouth daily.   Yes [provider]  atorvastatin (LIPITOR) 40 MG tablet Take 40 mg by mouth daily.   Yes [provider]  buPROPion (WELLBUTRIN XL) 150 MG 24 hr tablet Take 150 mg by mouth every morning. 09/13/19  Yes [provider]  carisoprodol (SOMA) 350 MG tablet TAKE 1 TABLET (350 MG TOTAL) BY MOUTH 3 (THREE) TIMES DAILY. 09/15/19  Yes Meredith Staggers, MD  Cholecalciferol (VITAMIN D3) 2000 units capsule Take 2,000 Units by mouth daily.    Yes [provider]  Fluticasone-Salmeterol (ADVAIR) 500-50  MCG/DOSE AEPB Inhale 1 puff into the lungs every 12 (twelve) hours.   Yes [provider]  insulin aspart (NOVOLOG FLEXPEN) 100 UNIT/ML FlexPen Inject 15 Units into the skin 3 (three) times daily with meals. Patient taking differently: Inject 44 Units into the skin 3 (three) times daily with meals.  03/24/17  Yes Florencia Reasons, MD  insulin glargine (LANTUS) 100 UNIT/ML injection Inject 80 Units into the skin at bedtime.    Yes [provider]  lisinopril (PRINIVIL,ZESTRIL) 20 MG tablet Take 1 tablet (20 mg total) by mouth daily. 03/24/17 10/30/19 Yes Florencia Reasons, MD  montelukast (SINGULAIR) 10 MG tablet Take 10 mg by mouth at bedtime.    Yes [provider]  oxybutynin (DITROPAN) 5 MG tablet Take 5 mg by mouth 2 (two) times daily.  03/13/16  Yes [provider]  Oxycodone HCl 10 MG TABS Take 1 tablet (10 mg total) by mouth every 8 (eight) hours as needed. Patient taking differently: Take 10 mg by mouth every 8 (eight) hours as needed (pain).  08/31/19  Yes Bayard Hugger, NP  promethazine (PHENERGAN) 25 MG tablet Take 25 mg by mouth every 6 (six) hours as needed for nausea or vomiting.    Yes [provider]  senna-docusate (SENOKOT-S) 8.6-50 MG tablet Take 1 tablet by mouth at bedtime. 03/24/17  Yes Florencia Reasons, MD  sertraline (ZOLOFT) 100 MG tablet Take 200 mg by mouth daily.    Yes [provider]  BD PEN NEEDLE NANO U/F 32G X 4 MM MISC 2 (  two) times daily. as directed 03/13/16   [provider]  nystatin cream (MYCOSTATIN) Apply 1 application topically 2 (two) times daily. Patient not taking: Reported on 10/31/2019 03/11/18   Huel Cote, NP     Vital Signs: BP (!) 131/48 (BP Location: Left Arm) Comment: kasia notified   Pulse 83   Temp 98.2 F (36.8 C) (Oral)   Resp (!) 22 Comment: kasia notified   Ht 5\' 8"  (1.727 m)   Wt (!) 387 lb 9.1 oz (175.8 kg)   SpO2 95%   BMI 58.93 kg/m   Physical Exam NAD, alert Abdomen: both bulbs emptied  right before my visit.  Appears right bulb with tan milky drainage,60 mL in past 24 hrs. Left drain with 10 mL recorded.  Both insertion sites appear intact.   Imaging: No results found.  Labs:  CBC: Recent Labs    11/04/19 0746 11/05/19 0525 11/06/19 0816 11/07/19 0550  WBC 18.9* 21.7* 19.0* 15.2*  HGB 9.5* 9.3* 9.3* 8.9*  HCT 32.1* 32.0* 31.1* 30.5*  PLT 314 337 322 333    COAGS: Recent Labs    11/01/19 0526 11/02/19 0503 11/03/19 0618 11/04/19 0746  INR 1.4* 1.3* 1.3* 1.2    BMP: Recent Labs    11/03/19 0618 11/04/19 0746 11/05/19 0525 11/06/19 0816  NA 135 135  135 136 136  K 4.3 4.3  4.2 4.1 4.4  CL 105 104  104 104 104  CO2 22 24  24 23 26   GLUCOSE 94 130*  131* 135* 129*  BUN 28* 21*  21* 17 14  CALCIUM 8.2* 8.3*  8.2* 8.5* 8.4*  CREATININE 1.28* 1.04*  0.92 0.92 0.90  GFRNONAA 48* >60  >60 >60 >60  GFRAA 55* >60  >60 >60 >60    LIVER FUNCTION TESTS: Recent Labs    11/02/19 0503 11/03/19 0618 11/04/19 0746 11/05/19 0525  BILITOT 0.7 0.3 0.4 0.3  AST 54* 31 25 19   ALT 43 33 34 25  ALKPHOS 178* 201* 274* 268*  PROT 6.4* 6.1* 6.1* 6.3*  ALBUMIN 2.1* 1.9* 1.9* 2.0*    Assessment and Plan: Diverticulitis s/p drain placements x3 2 drains remain in place  Patient overall improving.  Remains afebrile. Tolerating diet.  Her WBC seems to be trending back downward today-- now 15.2. Plan is for CT today to reassess collections.   Continue current management.  IR following.   Electronically Signed: Docia Barrier, PA 11/07/2019, 3:41 PM   I spent a total of 15 Minutes at the the patient's bedside AND on the patient's hospital floor or unit, greater than 50% of which was counseling/coordinating care for diverticulitis with abscess.

## 2019-11-07 NOTE — Progress Notes (Signed)
Kristen [name] [id number] 1966-08-24  CARE TEAM:  PCP: Lawerance Cruel, MD  Outpatient Care Team: Patient Care Team: Lawerance Cruel, MD as PCP - General (Family Medicine)  Inpatient Treatment Team: Treatment Team: Attending Provider: Georgette Shell, MD; Consulting Physician: Edison Pace, Md, MD; Rounding Team: Dorthy Cooler Radiology, MD; Social Worker: Merri Brunette; Registered Nurse: Thompson Grayer, RN; Rounding Team: Fatima Blank, MD; Registered Nurse: Marlowe Kays, RN; Technician: Berenice Bouton, NT; Registered Nurse: Mliss Sax, RN; Registered Nurse: Jorene Minors, RN   Problem List:   Principal Problem:   Diverticulitis of large intestine with abscess Active Problems:   Essential hypertension   Asthma   Morbid obesity with BMI of 50.0-59.9, adult ([place])   Type 2 diabetes mellitus with stage 3 chronic kidney disease ([place])   Diverticulitis   Abscess of sigmoid colon   History of pulmonary embolus (PE)   AKI (acute kidney injury) ([place])   Hyponatremia   Hyperglycemia      * No surgery found *      Assessment  C. difficile work-up for diarrhea Type 2 diabetes Hypertension Hx pulmonary embolism-chronic anticoagulation- Eliquis Hx of CVA with vision loss Chronic back pain - oxycodone/[facility] Asthma Sleep apnea BMI 58.93 Acute on chronic kidney disease; creatinine2.21>>1.98>>1.56>>1.28>>0.92  Acute diverticulitis with large abscess  Plan:  IR drain placement x3 on 10/31/19, Dr. Markus Daft: Drains #1 & #3 remain  WBC trending down  JIR:CVELF diet.  Mechanically softened is reasonable.  Heart healthy/diabetic   ID: Zosyn 1/9>> continue for now.  Consider stopping once leukocytosis has resolved and there are no evidence of any abscesses and patient on solid diet.  Possibly in the coming week.  DVT: Fully anticoagulated given history of stroke and pulmonary embolism.  IV heparin per  primary medicine service If CAT scan shows no evidence of any abscess and no need for any percutaneous drainage, most likely can transition back to oral anticoagulation  Follow-up:TBD.  Hopefully patient can transition to oral antibiotics and follow-up at drain clinic and surgery in the coming week if she continues to improve.  At some point the patient would benefit from colonoscopy and segmental colonic resection of area of diverticulitis to break the cycle of attacks.  Last colonoscopy in 2017 was a poor quality.  Ideally would want to this current attack and infection / abscess to be resolved before proceeding with colonoscopy and then colectomy 6-8 weeks from now.  Would need medical clearance given her history of pulmonary embolism.  Cardiac clearance by Dr. Harrington Challenger   Plan:     Continue antibiotics.  Continue drain care  Solid diet.  Diabetic control.  Follow-up CT scan today Sunday.  That way if there are concerns of undrained abscesses, percutaneous drainage or adjustment can happen on Monday.  (Hospital Stay = 8 days)     35  minutes spent in review, evaluation, examination, counseling, and coordination of care.  More than 50% of that time was spent in counseling.  11/07/2019    Subjective: (Chief complaint)  Pain less.  Cannot tolerate supplemental shakes as they are too sweet and sugary.  Had loose bowel movements and diarrhea but seems to be slowing down.  Appetite better.  Objective:  Vital signs:  Vitals:   11/06/19 1412 11/06/19 2005 11/06/19 2012 11/07/19 0400  BP: (!) 175/76  (!) 145/69 111/62  Pulse: 79  81 78  Resp: 16  20 20   Temp:  98.4 F (36.9 C)  98.7 F (37.1 C) 97.8 F (36.6 C)  TempSrc: Oral  Oral Oral  SpO2: 98% 98% 98% 98%  Weight:      Height:        Last BM Date: 11/05/19  Intake/Output   Yesterday:  01/16 0701 - 01/17 0700 In: 880 [P.O.:840; I.V.:10] Out: 1270 [Urine:1200; Drains:70] This shift:  No intake/output data  recorded.  Bowel function:  Flatus: YES  BM:  YES  Drain: Serous   Physical Exam:  General: Pt awake/alert/oriented x4 in no acute distress Eyes: PERRL, normal EOM.  Sclera clear.  No icterus Neuro: CN II-XII intact w/o focal sensory/motor deficits. Lymph: No head/neck/groin lymphadenopathy Psych:  No delerium/psychosis/paranoia HENT: Normocephalic, Mucus membranes moist.  No thrush Neck: Supple, No tracheal deviation Chest: No chest wall pain w good excursion CV:  Pulses intact.  Regular rhythm MS: Normal AROM mjr joints.  No obvious deformity  Abdomen: Soft.  Nondistended.  Tenderness at Percutaneous drain sites.  No evidence of peritonitis.  No incarcerated hernias.  Ext:   No deformity.  No mjr edema.  No cyanosis Skin: No petechiae / purpura  Results:   Cultures: Recent Results (from the past 720 hour(s))  Respiratory Panel by RT PCR (Flu A&B, Covid) - Nasopharyngeal Swab     Status: None   Collection Time: 10/30/19  7:25 PM   Specimen: Nasopharyngeal Swab  Result Value Ref Range Status   SARS Coronavirus 2 by RT PCR NEGATIVE NEGATIVE Final    Comment: (NOTE) SARS-CoV-2 target nucleic acids are NOT DETECTED. The SARS-CoV-2 RNA is generally detectable in upper respiratoy specimens during the acute phase of infection. The lowest concentration of SARS-CoV-2 viral copies this assay can detect is 131 copies/mL. A negative result does not preclude SARS-Cov-2 infection and should not be used as the sole basis for treatment or other patient management decisions. A negative result may occur with  improper specimen collection/handling, submission of specimen other than nasopharyngeal swab, presence of viral mutation(s) within the areas targeted by this assay, and inadequate number of viral copies (<131 copies/mL). A negative result must be combined with clinical observations, patient history, and epidemiological information. The expected result is Negative. Fact Sheet for  Patients:  PinkCheek.be Fact Sheet for Healthcare Providers:  GravelBags.it This test is not yet ap proved or cleared by the [place] FDA and  has been authorized for detection and/or diagnosis of SARS-CoV-2 by FDA under an Emergency Use Authorization (EUA). This EUA will remain  in effect (meaning this test can be used) for the duration of the COVID-19 declaration under Section 564(b)(1) of the Act, 21 U.S.C. section 360bbb-3(b)(1), unless the authorization is terminated or revoked sooner.    Influenza A by PCR NEGATIVE NEGATIVE Final   Influenza B by PCR NEGATIVE NEGATIVE Final    Comment: (NOTE) The Xpert Xpress SARS-CoV-2/FLU/RSV assay is intended as an aid in  the diagnosis of influenza from Nasopharyngeal swab specimens and  should not be used as a sole basis for treatment. Nasal washings and  aspirates are unacceptable for Xpert Xpress SARS-CoV-2/FLU/RSV  testing. Fact Sheet for Patients: PinkCheek.be Fact Sheet for Healthcare Providers: GravelBags.it This test is not yet approved or cleared by the [place] FDA and  has been authorized for detection and/or diagnosis of SARS-CoV-2 by  FDA under an Emergency Use Authorization (EUA). This EUA will remain  in effect (meaning this test can be used) for the duration of the  Covid-19 declaration under Section  564(b)(1) of the Act, 21  U.S.C. section 360bbb-3(b)(1), unless the authorization is  terminated or revoked. Performed at [name], [place] [address] [place], [place] [address]   Culture, blood (routine x 2)     Status: None (Preliminary result)   Collection Time: 10/30/19  8:13 PM   Specimen: BLOOD  Result Value Ref Range Status   Specimen Description   Final    BLOOD LEFT HAND Performed at [place] [address], [place], [place] [address]    Special  Requests   Final    BOTTLES DRAWN AEROBIC AND ANAEROBIC Blood Culture adequate volume Performed at [place] [address] [place], [place] [address]    Culture   Final    NO GROWTH 4 DAYS Performed at [facility], [place] [address], [place], [place] [address]    Report Status PENDING  Incomplete  Culture, blood (routine x 2)     Status: None (Preliminary result)   Collection Time: 10/30/19 11:00 PM   Specimen: BLOOD RIGHT HAND  Result Value Ref Range Status   Specimen Description   Final    BLOOD RIGHT HAND Performed at [place] [address], [place], [place] [address]    Special Requests   Final    BOTTLES DRAWN AEROBIC AND ANAEROBIC Blood Culture adequate volume Performed at [place] [address], [place], [place] [address]    Culture   Final    NO GROWTH 4 DAYS Performed at [facility], [place] [address], [place], [place] [address]    Report Status PENDING  Incomplete  C difficile quick scan w PCR reflex     Status: None   Collection Time: 10/31/19  8:16 AM   Specimen: STOOL  Result Value Ref Range Status   C Diff antigen NEGATIVE NEGATIVE Final   C Diff toxin NEGATIVE NEGATIVE Final   C Diff interpretation No C. difficile detected.  Final    Comment: Performed at [facility], [place] [address] [place], [place] [address]  Aerobic/Anaerobic Culture (surgical/deep wound)     Status: None   Collection Time: 10/31/19  4:11 PM   Specimen: Abscess  Result Value Ref Range Status   Specimen Description   Final    ABSCESS Performed at [place] [address], [place], [place] [address]    Special Requests   Final    1 LEFT ABDOMINAL LATERAL Performed at [facility], [place] [address] [place], [place] [address]    Gram Stain   Final    ABUNDANT WBC PRESENT, PREDOMINANTLY PMN ABUNDANT GRAM POSITIVE COCCI IN PAIRS IN CLUSTERS IN  CHAINS FEW GRAM NEGATIVE COCCOBACILLI RARE GRAM POSITIVE RODS Performed at [facility], [place] [address], [place], [place] [address]    Culture   Final    MODERATE ESCHERICHIA COLI MIXED ANAEROBIC FLORA PRESENT.  CALL LAB IF FURTHER IID REQUIRED.    Report Status 11/04/2019 FINAL  Final   Organism ID, Bacteria ESCHERICHIA COLI  Final      Susceptibility   Escherichia coli - MIC*    AMPICILLIN >=32 RESISTANT Resistant     CEFAZOLIN <=4 SENSITIVE Sensitive     CEFEPIME <=0.12 SENSITIVE Sensitive     CEFTAZIDIME <=1 SENSITIVE Sensitive     CEFTRIAXONE <=0.25 SENSITIVE Sensitive     CIPROFLOXACIN >=4 RESISTANT Resistant     GENTAMICIN >=16 RESISTANT Resistant  IMIPENEM <=0.25 SENSITIVE Sensitive     TRIMETH/SULFA >=320 RESISTANT Resistant     AMPICILLIN/SULBACTAM >=32 RESISTANT Resistant     PIP/TAZO <=4 SENSITIVE Sensitive     * MODERATE ESCHERICHIA COLI  Aerobic/Anaerobic Culture (surgical/deep wound)     Status: None (Preliminary result)   Collection Time: 10/31/19  4:19 PM   Specimen: Abscess  Result Value Ref Range Status   Specimen Description   Final    ABSCESS Performed at [place] [address], [place], [place] [address]    Special Requests   Final    2 LEFT ABDOMEN MEDIAL Performed at [name], [place] [address] [place], [place] [address]    Gram Stain   Final    FEW WBC PRESENT,BOTH PMN AND MONONUCLEAR ABUNDANT GRAM POSITIVE COCCI IN PAIRS IN CHAINS RARE GRAM POSITIVE RODS RARE GRAM NEGATIVE RODS    Culture   Final    ABUNDANT ACTINOMYCES NEUII Standardized susceptibility testing for this organism is not available. HOLDING FOR POSSIBLE ANAEROBE Performed at [facility], [place] [address], [place], [place] [address]    Report Status PENDING  Incomplete    Labs: Results for orders placed or performed during the hospital encounter of 10/30/19 (from the past 48 hour(s))  Glucose, capillary     Status:  Abnormal   Collection Time: 11/05/19  8:03 AM  Result Value Ref Range   Glucose-Capillary 117 (H) 70 - 99 mg/dL  Glucose, capillary     Status: Abnormal   Collection Time: 11/05/19 11:43 AM  Result Value Ref Range   Glucose-Capillary 180 (H) 70 - 99 mg/dL  Heparin level (unfractionated)     Status: Abnormal   Collection Time: 11/05/19  3:43 PM  Result Value Ref Range   Heparin Unfractionated 0.20 (L) 0.30 - 0.70 IU/mL    Comment: (NOTE) If heparin results are below expected values, and patient dosage has  been confirmed, suggest follow up testing of antithrombin III levels. Performed at [facility], [place] [address] [place], [place] [address]   Glucose, capillary     Status: Abnormal   Collection Time: 11/05/19  4:29 PM  Result Value Ref Range   Glucose-Capillary 146 (H) 70 - 99 mg/dL  Glucose, capillary     Status: Abnormal   Collection Time: 11/05/19  8:41 PM  Result Value Ref Range   Glucose-Capillary 138 (H) 70 - 99 mg/dL  Heparin level (unfractionated)     Status: None   Collection Time: 11/05/19 10:35 PM  Result Value Ref Range   Heparin Unfractionated 0.44 0.30 - 0.70 IU/mL    Comment: (NOTE) If heparin results are below expected values, and patient dosage has  been confirmed, suggest follow up testing of antithrombin III levels. Performed at [facility], [place] [address], [place], [place] [address]   Glucose, capillary     Status: Abnormal   Collection Time: 11/06/19 12:21 AM  Result Value Ref Range   Glucose-Capillary 149 (H) 70 - 99 mg/dL  Glucose, capillary     Status: Abnormal   Collection Time: 11/06/19  4:11 AM  Result Value Ref Range   Glucose-Capillary 151 (H) 70 - 99 mg/dL  Glucose, capillary     Status: Abnormal   Collection Time: 11/06/19  7:48 AM  Result Value Ref Range   Glucose-Capillary 114 (H) 70 - 99 mg/dL  Magnesium     Status: None   Collection Time: 11/06/19  8:16 AM  Result  Value Ref Range   Magnesium  1.7 1.7 - 2.4 mg/dL    Comment: Performed at [facility], [place] [address] [place], [place] [address]  Phosphorus     Status: None   Collection Time: 11/06/19  8:16 AM  Result Value Ref Range   Phosphorus 3.2 2.5 - 4.6 mg/dL    Comment: Performed at [facility], [place] [address], [place], [place] [address]  CBC     Status: Abnormal   Collection Time: 11/06/19  8:16 AM  Result Value Ref Range   WBC 19.0 (H) 4.0 - 10.5 K/uL   RBC 3.57 (L) 3.87 - 5.11 MIL/uL   Hemoglobin 9.3 (L) 12.0 - 15.0 g/dL   HCT 31.1 (L) 36.0 - 46.0 %   MCV 87.1 80.0 - 100.0 fL   MCH 26.1 26.0 - 34.0 pg   MCHC 29.9 (L) 30.0 - 36.0 g/dL   RDW 15.0 11.5 - 15.5 %   Platelets 322 150 - 400 K/uL   nRBC 0.0 0.0 - 0.2 %    Comment: Performed at [facility], [place] [address] [place], [place] [address]  Basic metabolic panel once in am     Status: Abnormal   Collection Time: 11/06/19  8:16 AM  Result Value Ref Range   Sodium 136 135 - 145 mmol/L   Potassium 4.4 3.5 - 5.1 mmol/L   Chloride 104 98 - 111 mmol/L   CO2 26 22 - 32 mmol/L   Glucose, Bld 129 (H) 70 - 99 mg/dL   BUN 14 6 - 20 mg/dL   Creatinine, Ser 0.90 0.44 - 1.00 mg/dL   Calcium 8.4 (L) 8.9 - 10.3 mg/dL   GFR calc non Af Amer >60 >60 mL/min   GFR calc Af Amer >60 >60 mL/min   Anion gap 6 5 - 15    Comment: Performed at [facility], [place] [address], [place], [place] [address]  Heparin level (unfractionated)     Status: Abnormal   Collection Time: 11/06/19  8:16 AM  Result Value Ref Range   Heparin Unfractionated 0.77 (H) 0.30 - 0.70 IU/mL    Comment: (NOTE) If heparin results are below expected values, and patient dosage has  been confirmed, suggest follow up testing of antithrombin III levels. Performed at [facility], [place] [address], [place], [place] [address]   Glucose, capillary     Status: Abnormal   Collection Time: 11/06/19 11:42 AM  Result Value  Ref Range   Glucose-Capillary 147 (H) 70 - 99 mg/dL  Glucose, capillary     Status: Abnormal   Collection Time: 11/06/19  4:46 PM  Result Value Ref Range   Glucose-Capillary 123 (H) 70 - 99 mg/dL  Heparin level (unfractionated)     Status: None   Collection Time: 11/06/19  6:41 PM  Result Value Ref Range   Heparin Unfractionated 0.58 0.30 - 0.70 IU/mL    Comment: (NOTE) If heparin results are below expected values, and patient dosage has  been confirmed, suggest follow up testing of antithrombin III levels. Performed at [facility], [place] [address], [place], [place] [address]   Glucose, capillary     Status: Abnormal   Collection Time: 11/06/19  8:14 PM  Result Value Ref Range   Glucose-Capillary 186 (H) 70 - 99 mg/dL  Glucose, capillary     Status: Abnormal   Collection Time: 11/07/19 12:12 AM  Result Value Ref Range   Glucose-Capillary  186 (H) 70 - 99 mg/dL  Glucose, capillary     Status: Abnormal   Collection Time: 11/07/19  4:03 AM  Result Value Ref Range   Glucose-Capillary 147 (H) 70 - 99 mg/dL  Magnesium     Status: None   Collection Time: 11/07/19  5:50 AM  Result Value Ref Range   Magnesium 1.7 1.7 - 2.4 mg/dL    Comment: Performed at [facility], [place] [address], [place], [place] [address]  Phosphorus     Status: None   Collection Time: 11/07/19  5:50 AM  Result Value Ref Range   Phosphorus 3.2 2.5 - 4.6 mg/dL    Comment: Performed at [name], [place] [address] [place], [place] [address]  CBC     Status: Abnormal   Collection Time: 11/07/19  5:50 AM  Result Value Ref Range   WBC 15.2 (H) 4.0 - 10.5 K/uL   RBC 3.47 (L) 3.87 - 5.11 MIL/uL   Hemoglobin 8.9 (L) 12.0 - 15.0 g/dL   HCT 30.5 (L) 36.0 - 46.0 %   MCV 87.9 80.0 - 100.0 fL   MCH 25.6 (L) 26.0 - 34.0 pg   MCHC 29.2 (L) 30.0 - 36.0 g/dL   RDW 15.2 11.5 - 15.5 %   Platelets 333 150 - 400 K/uL   nRBC 0.0 0.0 - 0.2 %    Comment: Performed at  [facility], [place] [address] [place], [place] [address]  Heparin level (unfractionated)     Status: None   Collection Time: 11/07/19  5:50 AM  Result Value Ref Range   Heparin Unfractionated 0.36 0.30 - 0.70 IU/mL    Comment: (NOTE) If heparin results are below expected values, and patient dosage has  been confirmed, suggest follow up testing of antithrombin III levels. Performed at [facility], [place] [address] [place], [place] [address]     Imaging / Studies: No results found.  Medications / Allergies: per chart  Antibiotics: Anti-infectives (From admission, onward)   Start     Dose/Rate Route Frequency Ordered Stop   10/30/19 2130  piperacillin-tazobactam (ZOSYN) IVPB 3.375 g     3.375 g 12.5 mL/hr over 240 Minutes Intravenous Every 8 hours 10/30/19 2100     10/30/19 2030  piperacillin-tazobactam (ZOSYN) IVPB 2.25 g  Status:  Discontinued     2.25 g 100 mL/hr over 30 Minutes Intravenous Every 6 hours 10/30/19 2024 10/30/19 2100   10/30/19 1930  cefTRIAXone (ROCEPHIN) 2 g in sodium chloride 0.9 % 100 mL IVPB  Status:  Discontinued     2 g 200 mL/hr over 30 Minutes Intravenous  Once 10/30/19 1916 10/30/19 2024   10/30/19 1930  metroNIDAZOLE (FLAGYL) IVPB 500 mg  Status:  Discontinued     500 mg 100 mL/hr over 60 Minutes Intravenous  Once 10/30/19 1916 10/30/19 2024        Note: Portions of this report may have been transcribed using voice recognition software. Every effort was made to ensure accuracy; however, inadvertent computerized transcription errors may be present.   Any transcriptional errors that result from this process are unintentional.     Adin Hector, MD, FACS, MASCRS Gastrointestinal and Minimally Invasive Surgery    [address], [place], [place] [address] [phone] Main / Paging [phone] Fax Please see Amion for pager number, especial 5pm - 7am.

## 2019-11-08 LAB — CBC
HCT: 30.2 % — ABNORMAL LOW (ref 36.0–46.0)
Hemoglobin: 8.9 g/dL — ABNORMAL LOW (ref 12.0–15.0)
MCH: 25.6 pg — ABNORMAL LOW (ref 26.0–34.0)
MCHC: 29.5 g/dL — ABNORMAL LOW (ref 30.0–36.0)
MCV: 87 fL (ref 80.0–100.0)
Platelets: 343 10*3/uL (ref 150–400)
RBC: 3.47 MIL/uL — ABNORMAL LOW (ref 3.87–5.11)
RDW: 15.1 % (ref 11.5–15.5)
WBC: 14.6 10*3/uL — ABNORMAL HIGH (ref 4.0–10.5)
nRBC: 0 % (ref 0.0–0.2)

## 2019-11-08 LAB — HEPARIN LEVEL (UNFRACTIONATED): Heparin Unfractionated: 0.3 IU/mL (ref 0.30–0.70)

## 2019-11-08 LAB — GLUCOSE, CAPILLARY
Glucose-Capillary: 154 mg/dL — ABNORMAL HIGH (ref 70–99)
Glucose-Capillary: 112 mg/dL — ABNORMAL HIGH (ref 70–99)
Glucose-Capillary: 127 mg/dL — ABNORMAL HIGH (ref 70–99)
Glucose-Capillary: 139 mg/dL — ABNORMAL HIGH (ref 70–99)
Glucose-Capillary: 301 mg/dL — ABNORMAL HIGH (ref 70–99)

## 2019-11-08 LAB — AEROBIC/ANAEROBIC CULTURE W GRAM STAIN (SURGICAL/DEEP WOUND): Special Requests: 2

## 2019-11-08 MED ORDER — METHOCARBAMOL 500 MG PO TABS
1000.0000 mg | ORAL_TABLET | Freq: Three times a day (TID) | ORAL | Status: DC
  Administered 2019-11-08 – 2019-11-12 (×13): 1000 mg via ORAL
  Filled 2019-11-08 (×13): qty 2

## 2019-11-08 MED ORDER — ACETAMINOPHEN 500 MG PO TABS
1000.0000 mg | ORAL_TABLET | Freq: Four times a day (QID) | ORAL | Status: DC
  Administered 2019-11-08 – 2019-11-12 (×15): 1000 mg via ORAL
  Filled 2019-11-08 (×15): qty 2

## 2019-11-08 MED ORDER — HYDROMORPHONE HCL 1 MG/ML IJ SOLN
0.5000 mg | INTRAMUSCULAR | Status: DC | PRN
  Administered 2019-11-09 – 2019-11-12 (×17): 1 mg via INTRAVENOUS
  Filled 2019-11-08 (×17): qty 1

## 2019-11-08 NOTE — Progress Notes (Signed)
Patient continues to decline nocturnal CPAP/BiPAP. 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 She also expresses noncompliance at home. Order changed to prn per RT protocol. RT will continue to follow.

## 2019-11-08 NOTE — Progress Notes (Signed)
PROGRESS NOTE    Kristen [name]  JQB:[id number] DOB: 06-30-1966 DOA: 10/30/2019 PCP: Lawerance Cruel, MD   Brief Narrative: 54 year old Caucasian female with PMH of morbid obesity, type 2 diabetes mellitus, chronic pain on opioids, hypertension, depression, anxiety, asthma, s/p cholecystectomy, CVA with residual vision loss, PCOS, sleep apnea who was brought to the ED by EMS from home for worsening abdominal painassociated with diarrhea. Recently treated for UTI with no improvement in abd pain.[place] home and largely immobile. Reported chronicdepressionand sometimes has suicidal ideations but none now. ED Course: Afebrile,noted to be hypertensive, tachycardic.Found to have leukocytosis of 26K and blood sugar elevated in the 300s. CT abdomen pelvis without contrast (no contrast due to renal dysfunction)showed perforated sigmoid diverticulitis with a large 12 cm abscess in addition to complex fluid collections extending into the patient's pelvis. ED provider spoke to surgeon on call Dr. Gala Lewandowsky who indicated no surgical need at this time and recommended IR consult for drainage of the abscess. Hospital course: Patient [name] for[facility]x and IR consulted. Patient underwent CT guided drain x3 placement on 1/10.  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 One of the drains were taken out by IR 11/03/2019.Draining purulent material. C. difficile negative On  Zosyn Continue Dilaudid for pain control,she still believes she needs a Dilaudid for intense pain. Drains placed by IR Followed by surgery WBCdown to 15.2 from 19.0.  Advance diet per general surgery. CT scan 11/07/2019 shows persistent abscess patient to have drain placed tomorrow.  #2 AKI on CKD stage III A-resolved with IV fluids.  Last creatinine we have is 0.90.    #3 type 2 diabetes with hyperglycemia -continue Lantus 60 units nightly with SSI. CBG (last 3)  Recent Labs    11/08/19 0432 11/08/19 0801 11/08/19 1152  GLUCAP 154* 112* 127*   #4 essential hypertension-blood pressure 130/66 improved since restarting ACE inhibitor.  Continue ACE.    #5 history of asthma stable continue inhalers and encourage incentive spirometry. Continue Singulair and Dulera.  #6 morbid obesity needs ongoing counseling regarding weight loss  #7 depression continue home meds Zoloft 200 mg daily.  #8 pyuria with rare bacteriuria initially treated for UTI.   #9 thrush-nystatin ordered   #10 history of PE was on Eliquis as an outpatient which is on hold for possible need for surgical intervention. Continue IV heparin.     Nutrition Problem: Increased nutrient needs Etiology: acute illness     Signs/Symptoms: estimated needs    Interventions: Prostat(Ensure Max)  Estimated body mass index is 58.93 kg/m as calculated from the following:   Height as of this encounter: 5\' 8"  (1.727 m).   Weight as of this encounter: 175.8 kg.  DVT prophylaxis:Heparin Code Status:Full code discussed with patient Family Communication:Discussed with patient  disposition Planpending further clinical improvement patient still on clear liquids and has2drains in place followed by IR and surgery and need for SNF bed Patient still with increasing leukocytosis repeat CT to be  done today.  Consultants:  IR and general surgery   Procedures:3 abdominal drains placed by  IR  AntimicrobialsZosyn Subjective: In bed complains of persistent nausea increase Zofran to every 4 as needed continues to have diarrhea and lower abdominal pain worse than yesterday.  Objective: Vitals:   11/07/19 2204 11/08/19 0430 11/08/19 0817 11/08/19 1520  BP: (!) 141/65 135/82  130/66  Pulse: 78 79  72  Resp: 20 20  15   Temp: 98.2 F (36.8 C) 98.7 F (37.1 C)  97.9 F (36.6 C)  TempSrc: Oral Oral  Oral  SpO2: 94% 94% 95% 94%  Weight:      Height:        Intake/Output Summary (Last 24 hours) at 11/08/2019 1601 Last data filed at 11/08/2019 1500 Gross per 24 hour  Intake 2491.99 ml  Output 350 ml  Net 2141.99 ml   Filed Weights   10/31/19 0200  Weight: (!) 175.8 kg    Examination:  General exam: Appears calm and comfortable  Respiratory system: Clear to auscultation. Respiratory effort normal. Cardiovascular system: S1 & S2 heard, RRR. No JVD, murmurs, rubs, gallops or clicks. No pedal edema. Gastrointestinal system: Abdomen is nondistended, soft and nontender. No organomegaly or masses felt. Normal bowel sounds heard.  2 drains in place 1 in the left and 1 on the right Central nervous system: Alert and oriented. No focal neurological deficits. Extremities: Symmetric 5 x 5 power. Skin: No rashes, lesions or ulcers Psychiatry: Judgement and insight appear normal. Mood & affect appropriate.     Data Reviewed: I have personally reviewed following labs and imaging studies  CBC: Recent Labs  Lab 11/04/19 0746 11/05/19 0525 11/06/19 0816 11/07/19 0550 11/08/19 0509  WBC 18.9* 21.7* 19.0* 15.2* 14.6*  HGB 9.5* 9.3* 9.3* 8.9* 8.9*  HCT 32.1* 32.0* 31.1* 30.5* 30.2*  MCV 87.5 87.4 87.1 87.9 87.0  PLT 314 337 322 333 182   Basic Metabolic Panel: Recent Labs  Lab 11/02/19 0503 11/02/19 0503 11/03/19 0618 11/04/19 0746 11/05/19 0525 11/06/19 0816 11/07/19 0550  NA 136  --  135 135  135 136 136  --   K 4.3  --  4.3 4.3  4.2 4.1 4.4  --   CL 105   --  105 104  104 104 104  --   CO2 22  --  22 24  24 23 26   --   GLUCOSE 115*  --  94 130*  131* 135* 129*  --   BUN 36*  --  28* 21*  21* 17 14  --   CREATININE 1.56*  --  1.28* 1.04*  0.92 0.92 0.90  --   CALCIUM 8.3*  --  8.2* 8.3*  8.2* 8.5* 8.4*  --   MG 1.8   < > 1.9 1.7 1.6* 1.7 1.7  PHOS 4.4   < > 4.2 3.2 2.8 3.2 3.2   < > = values in this interval not displayed.   GFR: Estimated Creatinine Clearance: 124 mL/min (by C-G formula based on SCr of 0.9 mg/dL). Liver Function Tests: Recent Labs  Lab 11/02/19 0503 11/03/19 0618 11/04/19 0746 11/05/19 0525  AST 54* 31 25 19   ALT 43 33 34 25  ALKPHOS 178* 201* 274* 268*  BILITOT 0.7 0.3 0.4 0.3  PROT 6.4* 6.1* 6.1* 6.3*  ALBUMIN 2.1* 1.9* 1.9* 2.0*   No results for input(s): LIPASE, AMYLASE in the last 168 hours. No results for input(s): AMMONIA in the last 168 hours. Coagulation Profile:  Recent Labs  Lab 11/02/19 0503 11/03/19 0618 11/04/19 0746  INR 1.3* 1.3* 1.2   Cardiac Enzymes: No results for input(s): CKTOTAL, CKMB, CKMBINDEX, TROPONINI in the last 168 hours. BNP (last 3 results) No results for input(s): PROBNP in the last 8760 hours. HbA1C: No results for input(s): HGBA1C in the last 72 hours. CBG: Recent Labs  Lab 11/07/19 2030 11/07/19 2347 11/08/19 0432 11/08/19 0801 11/08/19 1152  GLUCAP 179* 168* 154* 112* 127*   Lipid Profile: No results for input(s): CHOL, HDL, LDLCALC, TRIG, CHOLHDL, LDLDIRECT in the last 72 hours. Thyroid Function Tests: No results for input(s): TSH, T4TOTAL, FREET4, T3FREE, THYROIDAB in the last 72 hours. Anemia Panel: No results for input(s): VITAMINB12, FOLATE, FERRITIN, TIBC, IRON, RETICCTPCT in the last 72 hours. Sepsis Labs: No results for input(s): PROCALCITON, LATICACIDVEN in the last 168 hours.  Recent Results (from the past 240 hour(s))  Respiratory Panel by RT PCR (Flu A&B, Covid) - Nasopharyngeal Swab     Status: None   Collection Time: 10/30/19  7:25 PM    Specimen: Nasopharyngeal Swab  Result Value Ref Range Status   SARS Coronavirus 2 by RT PCR NEGATIVE NEGATIVE Final    Comment: (NOTE) SARS-CoV-2 target nucleic acids are NOT DETECTED. The SARS-CoV-2 RNA is generally detectable in upper respiratoy specimens during the acute phase of infection. The lowest concentration of SARS-CoV-2 viral copies this assay can detect is 131 copies/mL. A negative result does not preclude SARS-Cov-2 infection and should not be used as the sole basis for treatment or other patient management decisions. A negative result may occur with  improper specimen collection/handling, submission of specimen other than nasopharyngeal swab, presence of viral mutation(s) within the areas targeted by this assay, and inadequate number of viral copies (<131 copies/mL). A negative result must be combined with clinical observations, patient history, and epidemiological information. The expected result is Negative. Fact Sheet for Patients:  PinkCheek.be Fact Sheet for Healthcare Providers:  GravelBags.it This test is not yet ap proved or cleared by the [place] FDA and  has been authorized for detection and/or diagnosis of SARS-CoV-2 by FDA under an Emergency Use Authorization (EUA). This EUA will remain  in effect (meaning this test can be used) for the duration of the COVID-19 declaration under Section 564(b)(1) of the Act, 21 U.S.C. section 360bbb-3(b)(1), unless the authorization is terminated or revoked sooner.    Influenza A by PCR NEGATIVE NEGATIVE Final   Influenza B by PCR NEGATIVE NEGATIVE Final    Comment: (NOTE) The Xpert Xpress SARS-CoV-2/FLU/RSV assay is intended as an aid in  the diagnosis of influenza from Nasopharyngeal swab specimens and  should not be used as a sole basis for treatment. Nasal washings and  aspirates are unacceptable for Xpert Xpress SARS-CoV-2/FLU/RSV  testing. Fact  Sheet for Patients: PinkCheek.be Fact Sheet for Healthcare Providers: GravelBags.it This test is not yet approved or cleared by the [place] FDA and  has been authorized for detection and/or diagnosis of SARS-CoV-2 by  FDA under an Emergency Use Authorization (EUA). This EUA will remain  in effect (meaning this test can be used) for the duration of the  Covid-19 declaration under Section 564(b)(1) of the Act, 21  U.S.C. section 360bbb-3(b)(1), unless the authorization is  terminated or revoked. Performed at [facility], [place] [address] [place], [place] [address]   Culture, blood (routine x 2)     Status: None   Collection Time: 10/30/19  8:13 PM   Specimen: BLOOD  Result Value Ref Range Status   Specimen Description BLOOD LEFT HAND  Final   Special Requests   Final    BOTTLES DRAWN AEROBIC AND ANAEROBIC Blood Culture adequate volume Performed at [place] [address], [place], [place] [address]    Culture NO GROWTH 7 DAYS  Final   Report Status 11/07/2019 FINAL  Final  Culture, blood (routine x 2)     Status: None   Collection Time: 10/30/19 11:00 PM   Specimen: BLOOD RIGHT HAND  Result Value Ref Range Status   Specimen Description BLOOD RIGHT HAND  Final   Special Requests   Final    BOTTLES DRAWN AEROBIC AND ANAEROBIC Blood Culture adequate volume Performed at [place] [address] [place] [address], [place], [place] [address]    Culture NO GROWTH 7 DAYS  Final   Report Status 11/07/2019 FINAL  Final  C difficile quick scan w PCR reflex     Status: None   Collection Time: 10/31/19  8:16 AM   Specimen: STOOL  Result Value Ref Range Status   C Diff antigen NEGATIVE NEGATIVE Final   C Diff toxin NEGATIVE NEGATIVE Final   C Diff interpretation No C. difficile detected.  Final    Comment: Performed at [facility], [place] [address],  [place], [place] [address]  Aerobic/Anaerobic Culture (surgical/deep wound)     Status: None   Collection Time: 10/31/19  4:11 PM   Specimen: Abscess  Result Value Ref Range Status   Specimen Description   Final    ABSCESS Performed at [place] [address], [place], [place] [address]    Special Requests   Final    1 LEFT ABDOMINAL LATERAL Performed at [facility], [place] [address], [place], [place] [address]    Gram Stain   Final    ABUNDANT WBC PRESENT, PREDOMINANTLY PMN ABUNDANT GRAM POSITIVE COCCI IN PAIRS IN CLUSTERS IN CHAINS FEW GRAM NEGATIVE COCCOBACILLI RARE GRAM POSITIVE RODS Performed at [facility], [place] [address] [place], [place] [address]    Culture   Final    MODERATE ESCHERICHIA COLI MIXED ANAEROBIC FLORA PRESENT.  CALL LAB IF FURTHER IID REQUIRED.    Report Status 11/04/2019 FINAL  Final   Organism ID, Bacteria ESCHERICHIA COLI  Final      Susceptibility   Escherichia coli - MIC*    AMPICILLIN >=32 RESISTANT Resistant     CEFAZOLIN <=4 SENSITIVE Sensitive     CEFEPIME <=0.12 SENSITIVE Sensitive     CEFTAZIDIME <=1 SENSITIVE Sensitive     CEFTRIAXONE <=0.25 SENSITIVE Sensitive     CIPROFLOXACIN >=4 RESISTANT Resistant     GENTAMICIN >=16 RESISTANT Resistant     IMIPENEM <=0.25 SENSITIVE Sensitive     TRIMETH/SULFA >=320 RESISTANT Resistant     AMPICILLIN/SULBACTAM >=32 RESISTANT Resistant     PIP/TAZO <=4 SENSITIVE Sensitive     * MODERATE ESCHERICHIA COLI  Aerobic/Anaerobic Culture (surgical/deep wound)     Status: None   Collection Time: 10/31/19  4:19 PM   Specimen: Abscess  Result Value Ref Range Status   Specimen Description   Final    ABSCESS Performed at [place] [address] [place], [place] [address]    Special Requests   Final    2 LEFT ABDOMEN MEDIAL Performed at [facility], [place] [address] [place], [place] [address]    Gram Stain   Final  FEW  WBC PRESENT,BOTH PMN AND MONONUCLEAR ABUNDANT GRAM POSITIVE COCCI IN PAIRS IN CHAINS RARE GRAM POSITIVE RODS RARE GRAM NEGATIVE RODS    Culture   Final    ABUNDANT ACTINOMYCES NEUII ABUNDANT PREVOTELLA SPECIES BETA LACTAMASE POSITIVE Performed at [facility], [place] [address] [place] [address], [place], [place] [address]    Report Status 11/08/2019 FINAL  Final         Radiology Studies: CT ABDOMEN PELVIS WO CONTRAST  Result Date: 11/07/2019 CLINICAL DATA:  Right upper quadrant abdominal pain with fever. EXAM: CT ABDOMEN AND PELVIS WITHOUT CONTRAST TECHNIQUE: Multidetector CT imaging of the abdomen and pelvis was performed following the standard protocol without IV contrast. COMPARISON:  CT dated 10/30/2019 FINDINGS: Lower chest: There is a small right-sided pleural effusion. There is atelectasis at the lung bases.The heart size is borderline enlarged. The intracardiac blood pool is hypodense relative to the adjacent myocardium consistent with anemia. Hepatobiliary: The liver is enlarged with borderline hepatic steatosis. Status post cholecystectomy.There is no biliary ductal dilation. Pancreas: Normal contours without ductal dilatation. No peripancreatic fluid collection. Spleen: The spleen is enlarged measuring approximately 15 cm craniocaudad. Adrenals/Urinary Tract: --Adrenal glands: No adrenal hemorrhage. --Right kidney/ureter: No hydronephrosis or perinephric hematoma. --Left kidney/ureter: No hydronephrosis or perinephric hematoma. --Urinary bladder: Bladder is underdistended, however there is diffuse bladder wall thickening. Stomach/Bowel: --Stomach/Duodenum: No hiatal hernia or other gastric abnormality. Normal duodenal course and caliber. --Small bowel: There is a matted appearance of multiple small bowel loops in the lobe midline abdomen. Evaluation is limited by lack of IV and oral contrast. Developing fistula in this location would be difficult to exclude at this time. --Colon: Again noted are  findings concerning for perforated sigmoid diverticulitis. There is moderate wall thickening of the sigmoid colon. --Appendix: Not visualized. No right lower quadrant inflammation or free fluid. Vascular/Lymphatic: Atherosclerotic calcification is present within the non-aneurysmal abdominal aorta, without hemodynamically significant stenosis. --there are multiple enlarged retroperitoneal lymph nodes, favored to be reactive in etiology. --there prominent mesenteric lymph nodes, favored to be reactive in etiology. --prominent pelvic lymph nodes are noted, favored to be reactive. Reproductive: There is a masslike structure in the patient's left hemipelvis measuring approximately 3.8 x 2.8 cm. This is favored to represent the left ovary. Other: There is a well-formed fluid collection in the patient's pelvis measuring approximately 5.8 x 6.3 cm. This is favored to represent developing abscess. Extensive inflammatory changes are again noted in the patient's low midline abdomen/high pelvis. Two percutaneous drains are noted. The previously demonstrated fluid collection in the midline abdomen has substantially improved from prior study. There is a small residual collection along the patient's right hemipelvis measuring approximately 4.9 x 1.7 cm. Evaluation of additional collections is limited by lack of both oral and IV contrast. Musculoskeletal. No acute displaced fractures. IMPRESSION: 1. Evaluation limited by lack of oral and IV contrast. 2. New walled off fluid collection in the patient's pelvis concerning for a developing abscess. Currently, this is amenable to percutaneous drainage as clinically indicated. 3. Status post placement of 2 percutaneous drains with significant interval improvement in the previously demonstrated large abdominal abscesses. Residual inflammatory changes and small pockets of free fluid are noted in the patient's low midline abdomen/high pelvis. 4. Diffuse wall thickening of the urinary  bladder, favored to be reactive. Correlation with urinalysis is recommended to help exclude cystitis. 5. Small right-sided pleural effusion. 6. Hepatomegaly with probable underlying hepatic steatosis. 7. Nonspecific splenomegaly. 8. Enlarged retroperitoneal and mesenteric lymph nodes, presumably reactive. Aortic  Atherosclerosis (ICD10-I70.0). Electronically Signed   By: Constance Holster M.D.   On: 11/07/2019 21:28        Scheduled Meds: . acetaminophen  1,000 mg Oral Q6H  . atorvastatin  40 mg Oral Daily  . cholecalciferol  2,000 Units Oral Daily  . feeding supplement (PRO-STAT SUGAR FREE 64)  30 mL Oral BID  . insulin aspart  0-20 Units Subcutaneous Q4H  . insulin aspart  10 Units Subcutaneous TID WC  . insulin glargine  60 Units Subcutaneous QHS  . lip balm  1 application Topical BID  . methocarbamol  1,000 mg Oral TID  . mometasone-formoterol  2 puff Inhalation BID  . montelukast  10 mg Oral QHS  . nystatin  5 mL Oral QID  . polyethylene glycol  17 g Oral Daily  . Ensure Max Protein  11 oz Oral BID  . saccharomyces boulardii  250 mg Oral BID  . sertraline  200 mg Oral Daily  . sodium chloride flush  3 mL Intravenous Q12H  . sodium chloride flush  3 mL Intravenous Q12H  . sodium chloride flush  5 mL Intracatheter Q8H  . sodium chloride flush  5 mL Intracatheter Q8H   Continuous Infusions: . sodium chloride    . heparin 3,200 Units/hr (11/08/19 0919)  . piperacillin-tazobactam (ZOSYN)  IV 3.375 g (11/08/19 1310)     LOS: 9 days     Georgette Shell, MD [facility]  If 7PM-7AM, please contact night-coverage [email or web address] Password [facility] 11/08/2019, 4:01 PM

## 2019-11-08 NOTE — Progress Notes (Signed)
Patient transferred from ICU. RN agrees with previous nurse assessment. Alert and Oriented x 4. Vital signs stable. Placed on tele.

## 2019-11-08 NOTE — Progress Notes (Signed)
PT Cancellation Note  Patient Details Name: Kristen [name] MRN: [id number] DOB: May 25, 1966   Cancelled Treatment:    Reason Eval/Treat Not Completed: Pain limiting ability to participate  Pt c/o severe pain and unable to participate.  Pt to have further drainage of abdominal fluid in IR today, that will hopefully improve pain control per RN.  Will f/u after procedure as able.   Maggie Font, PT Acute Rehab Services Pager [phone] [facility] [phone]    Karlton Lemon 11/08/2019, 12:03 PM

## 2019-11-08 NOTE — Progress Notes (Signed)
Referring Physician(s): Connor,C  Supervising Physician: Aletta Edouard  Patient Status:  [name] - In-pt  Chief Complaint:  Abdominal pain/abscesses  Subjective: Patient continues to have some abdominal pain/indigestion and intermittent nausea as well as headache.  Allergies: Cafergot, Glucophage [metformin hcl], and Canagliflozin  Medications: Prior to Admission medications   Medication Sig Start Date End Date Taking? Authorizing Provider  acetaminophen (TYLENOL) 500 MG tablet Take 1,000 mg by mouth every 6 (six) hours as needed for moderate pain.   Yes [provider]  albuterol (ACCUNEB) 1.25 MG/3ML nebulizer solution Inhale 1 ampule into the lungs 3 (three) times daily as needed for wheezing or shortness of breath.  12/12/10  Yes [provider]  albuterol (VENTOLIN HFA) 108 (90 Base) MCG/ACT inhaler Inhale 2 puffs into the lungs every 4 (four) hours as needed for wheezing or shortness of breath.  01/04/14  Yes [provider]  amoxicillin-clavulanate (AUGMENTIN) 875-125 MG tablet Take 1 tablet by mouth 2 (two) times daily. 10/25/19  Yes [provider]  apixaban (ELIQUIS) 5 MG TABS tablet Take two tabs twice a day for 7 days, then take one tab twice a day for a year. Patient taking differently: Take 2.5 mg by mouth 2 (two) times daily.  03/24/17  Yes Kristen Reasons, MD  aspirin EC 81 MG tablet Take 81 mg by mouth daily.   Yes [provider]  atorvastatin (LIPITOR) 40 MG tablet Take 40 mg by mouth daily.   Yes [provider]  buPROPion (WELLBUTRIN XL) 150 MG 24 hr tablet Take 150 mg by mouth every morning. 09/13/19  Yes [provider]  carisoprodol (SOMA) 350 MG tablet TAKE 1 TABLET (350 MG TOTAL) BY MOUTH 3 (THREE) TIMES DAILY. 09/15/19  Yes Meredith Staggers, MD  Cholecalciferol (VITAMIN D3) 2000 units capsule Take 2,000 Units by mouth daily.    Yes [provider]  Fluticasone-Salmeterol (ADVAIR) 500-50 MCG/DOSE  AEPB Inhale 1 puff into the lungs every 12 (twelve) hours.   Yes [provider]  insulin aspart (NOVOLOG FLEXPEN) 100 UNIT/ML FlexPen Inject 15 Units into the skin 3 (three) times daily with meals. Patient taking differently: Inject 44 Units into the skin 3 (three) times daily with meals.  03/24/17  Yes Kristen Reasons, MD  insulin glargine (LANTUS) 100 UNIT/ML injection Inject 80 Units into the skin at bedtime.    Yes [provider]  lisinopril (PRINIVIL,ZESTRIL) 20 MG tablet Take 1 tablet (20 mg total) by mouth daily. 03/24/17 10/30/19 Yes Kristen Reasons, MD  montelukast (SINGULAIR) 10 MG tablet Take 10 mg by mouth at bedtime.    Yes [provider]  oxybutynin (DITROPAN) 5 MG tablet Take 5 mg by mouth 2 (two) times daily.  03/13/16  Yes [provider]  Oxycodone HCl 10 MG TABS Take 1 tablet (10 mg total) by mouth every 8 (eight) hours as needed. Patient taking differently: Take 10 mg by mouth every 8 (eight) hours as needed (pain).  08/31/19  Yes Bayard Hugger, NP  promethazine (PHENERGAN) 25 MG tablet Take 25 mg by mouth every 6 (six) hours as needed for nausea or vomiting.    Yes [provider]  senna-docusate (SENOKOT-S) 8.6-50 MG tablet Take 1 tablet by mouth at bedtime. 03/24/17  Yes Kristen Reasons, MD  sertraline (ZOLOFT) 100 MG tablet Take 200 mg by mouth daily.    Yes [provider]  BD PEN NEEDLE NANO U/F 32G X 4 MM MISC 2 (two) times daily. as  directed 03/13/16   [provider]  nystatin cream (MYCOSTATIN) Apply 1 application topically 2 (two) times daily. Patient not taking: Reported on 10/31/2019 03/11/18   Huel Cote, NP     Vital Signs: BP 135/82 (BP Location: Right Arm)   Pulse 79   Temp 98.7 F (37.1 C) (Oral)   Resp 20   Ht 5\' 8"  (1.727 m)   Wt (!) 387 lb 9.1 oz (175.8 kg)   SpO2 95%   BMI 58.93 kg/m   Physical Exam awake, alert. Chest with diminished breath sounds at bases. Heart with regular rate /rhythm. Abdomen obese,  soft, positive bowel sounds, left and right abdominal drains intact with output ranging from 10 to 40 cc of light brown fluid.  Imaging: CT ABDOMEN PELVIS WO CONTRAST  Result Date: 11/07/2019 CLINICAL DATA:  Right upper quadrant abdominal pain with fever. EXAM: CT ABDOMEN AND PELVIS WITHOUT CONTRAST TECHNIQUE: Multidetector CT imaging of the abdomen and pelvis was performed following the standard protocol without IV contrast. COMPARISON:  CT dated 10/30/2019 FINDINGS: Lower chest: There is a small right-sided pleural effusion. There is atelectasis at the lung bases.The heart size is borderline enlarged. The intracardiac blood pool is hypodense relative to the adjacent myocardium consistent with anemia. Hepatobiliary: The liver is enlarged with borderline hepatic steatosis. Status post cholecystectomy.There is no biliary ductal dilation. Pancreas: Normal contours without ductal dilatation. No peripancreatic fluid collection. Spleen: The spleen is enlarged measuring approximately 15 cm craniocaudad. Adrenals/Urinary Tract: --Adrenal glands: No adrenal hemorrhage. --Right kidney/ureter: No hydronephrosis or perinephric hematoma. --Left kidney/ureter: No hydronephrosis or perinephric hematoma. --Urinary bladder: Bladder is underdistended, however there is diffuse bladder wall thickening. Stomach/Bowel: --Stomach/Duodenum: No hiatal hernia or other gastric abnormality. Normal duodenal course and caliber. --Small bowel: There is a matted appearance of multiple small bowel loops in the lobe midline abdomen. Evaluation is limited by lack of IV and oral contrast. Developing fistula in this location would be difficult to exclude at this time. --Colon: Again noted are findings concerning for perforated sigmoid diverticulitis. There is moderate wall thickening of the sigmoid colon. --Appendix: Not visualized. No right lower quadrant inflammation or free fluid. Vascular/Lymphatic: Atherosclerotic calcification is present  within the non-aneurysmal abdominal aorta, without hemodynamically significant stenosis. --there are multiple enlarged retroperitoneal lymph nodes, favored to be reactive in etiology. --there prominent mesenteric lymph nodes, favored to be reactive in etiology. --prominent pelvic lymph nodes are noted, favored to be reactive. Reproductive: There is a masslike structure in the patient's left hemipelvis measuring approximately 3.8 x 2.8 cm. This is favored to represent the left ovary. Other: There is a well-formed fluid collection in the patient's pelvis measuring approximately 5.8 x 6.3 cm. This is favored to represent developing abscess. Extensive inflammatory changes are again noted in the patient's low midline abdomen/high pelvis. Two percutaneous drains are noted. The previously demonstrated fluid collection in the midline abdomen has substantially improved from prior study. There is a small residual collection along the patient's right hemipelvis measuring approximately 4.9 x 1.7 cm. Evaluation of additional collections is limited by lack of both oral and IV contrast. Musculoskeletal. No acute displaced fractures. IMPRESSION: 1. Evaluation limited by lack of oral and IV contrast. 2. New walled off fluid collection in the patient's pelvis concerning for a developing abscess. Currently, this is amenable to percutaneous drainage as clinically indicated. 3. Status post placement of 2 percutaneous drains with significant interval improvement in the previously demonstrated large abdominal abscesses. Residual inflammatory changes and small pockets of free  fluid are noted in the patient's low midline abdomen/high pelvis. 4. Diffuse wall thickening of the urinary bladder, favored to be reactive. Correlation with urinalysis is recommended to help exclude cystitis. 5. Small right-sided pleural effusion. 6. Hepatomegaly with probable underlying hepatic steatosis. 7. Nonspecific splenomegaly. 8. Enlarged retroperitoneal and  mesenteric lymph nodes, presumably reactive. Aortic Atherosclerosis (ICD10-I70.0). Electronically Signed   By: Constance Holster M.D.   On: 11/07/2019 21:28    Labs:  CBC: Recent Labs    11/05/19 0525 11/06/19 0816 11/07/19 0550 11/08/19 0509  WBC 21.7* 19.0* 15.2* 14.6*  HGB 9.3* 9.3* 8.9* 8.9*  HCT 32.0* 31.1* 30.5* 30.2*  PLT 337 322 333 343    COAGS: Recent Labs    11/01/19 0526 11/02/19 0503 11/03/19 0618 11/04/19 0746  INR 1.4* 1.3* 1.3* 1.2    BMP: Recent Labs    11/03/19 0618 11/04/19 0746 11/05/19 0525 11/06/19 0816  NA 135 135  135 136 136  K 4.3 4.3  4.2 4.1 4.4  CL 105 104  104 104 104  CO2 22 24  24 23 26   GLUCOSE 94 130*  131* 135* 129*  BUN 28* 21*  21* 17 14  CALCIUM 8.2* 8.3*  8.2* 8.5* 8.4*  CREATININE 1.28* 1.04*  0.92 0.92 0.90  GFRNONAA 48* >60  >60 >60 >60  GFRAA 55* >60  >60 >60 >60    LIVER FUNCTION TESTS: Recent Labs    11/02/19 0503 11/03/19 0618 11/04/19 0746 11/05/19 0525  BILITOT 0.7 0.3 0.4 0.3  AST 54* 31 25 19   ALT 43 33 34 25  ALKPHOS 178* 201* 274* 268*  PROT 6.4* 6.1* 6.1* 6.3*  ALBUMIN 2.1* 1.9* 1.9* 2.0*    Assessment and Plan: Diverticulitis s/p drain placements x3  10/31/19 2 drains remain in place  Afebrile, WBC 14.6 down slightly from 15.2, hemoglobin stable Follow-up CT yesterday showed walled off fluid collection in patient's pelvis concerning for developing abscess; significant interval improvement in previously demonstrated and drained large abdominal abscesses. Request received from surgery for drainage of posterior pelvic fluid collection. Patient updated with plans and consent signed. Procedure scheduled for 1/19. IV heparin will need to be stopped 3 hours prior to case.   Electronically Signed: D. Rowe Robert, PA-C 11/08/2019, 2:47 PM   I spent a total of 20 minutes at the the patient's bedside AND on the patient's hospital floor or unit, greater than 50% of which was  counseling/coordinating care for CT-guided pelvic fluid collection drainage    Patient ID: Kristen [name], female   DOB: 29-Oct-1965, 54 y.o.   MRN: [id number]

## 2019-11-08 NOTE — Progress Notes (Signed)
Subjective/Chief Complaint: Complains of indigestion   Objective: Vital signs in last 24 hours: Temp:  [98.2 F (36.8 C)-98.7 F (37.1 C)] 98.7 F (37.1 C) (01/18 0430) Pulse Rate:  [78-83] 79 (01/18 0430) Resp:  [20-22] 20 (01/18 0430) BP: (131-141)/(48-82) 135/82 (01/18 0430) SpO2:  [93 %-95 %] 95 % (01/18 0817) Last BM Date: 11/07/19  Intake/Output from previous day: 01/17 0701 - 01/18 0700 In: 2102.8 [P.O.:360; I.V.:1503; IV Piggyback:209.8] Out: 350 [Urine:300; Drains:50] Intake/Output this shift: No intake/output data recorded.  General appearance: alert and cooperative Resp: unlabored GI: obese, mildly diffusely tender. Drains x 2 with purulent output  Lab Results:  Recent Labs    11/07/19 0550 11/08/19 0509  WBC 15.2* 14.6*  HGB 8.9* 8.9*  HCT 30.5* 30.2*  PLT 333 343   BMET Recent Labs    11/06/19 0816  NA 136  K 4.4  CL 104  CO2 26  GLUCOSE 129*  BUN 14  CREATININE 0.90  CALCIUM 8.4*   PT/INR No results for input(s): LABPROT, INR in the last 72 hours. ABG No results for input(s): PHART, HCO3 in the last 72 hours.  Invalid input(s): PCO2, PO2  Studies/Results: CT ABDOMEN PELVIS WO CONTRAST  Result Date: 11/07/2019 CLINICAL DATA:  Right upper quadrant abdominal pain with fever. EXAM: CT ABDOMEN AND PELVIS WITHOUT CONTRAST TECHNIQUE: Multidetector CT imaging of the abdomen and pelvis was performed following the standard protocol without IV contrast. COMPARISON:  CT dated 10/30/2019 FINDINGS: Lower chest: There is a small right-sided pleural effusion. There is atelectasis at the lung bases.The heart size is borderline enlarged. The intracardiac blood pool is hypodense relative to the adjacent myocardium consistent with anemia. Hepatobiliary: The liver is enlarged with borderline hepatic steatosis. Status post cholecystectomy.There is no biliary ductal dilation. Pancreas: Normal contours without ductal dilatation. No peripancreatic fluid  collection. Spleen: The spleen is enlarged measuring approximately 15 cm craniocaudad. Adrenals/Urinary Tract: --Adrenal glands: No adrenal hemorrhage. --Right kidney/ureter: No hydronephrosis or perinephric hematoma. --Left kidney/ureter: No hydronephrosis or perinephric hematoma. --Urinary bladder: Bladder is underdistended, however there is diffuse bladder wall thickening. Stomach/Bowel: --Stomach/Duodenum: No hiatal hernia or other gastric abnormality. Normal duodenal course and caliber. --Small bowel: There is a matted appearance of multiple small bowel loops in the lobe midline abdomen. Evaluation is limited by lack of IV and oral contrast. Developing fistula in this location would be difficult to exclude at this time. --Colon: Again noted are findings concerning for perforated sigmoid diverticulitis. There is moderate wall thickening of the sigmoid colon. --Appendix: Not visualized. No right lower quadrant inflammation or free fluid. Vascular/Lymphatic: Atherosclerotic calcification is present within the non-aneurysmal abdominal aorta, without hemodynamically significant stenosis. --there are multiple enlarged retroperitoneal lymph nodes, favored to be reactive in etiology. --there prominent mesenteric lymph nodes, favored to be reactive in etiology. --prominent pelvic lymph nodes are noted, favored to be reactive. Reproductive: There is a masslike structure in the patient's left hemipelvis measuring approximately 3.8 x 2.8 cm. This is favored to represent the left ovary. Other: There is a well-formed fluid collection in the patient's pelvis measuring approximately 5.8 x 6.3 cm. This is favored to represent developing abscess. Extensive inflammatory changes are again noted in the patient's low midline abdomen/high pelvis. Two percutaneous drains are noted. The previously demonstrated fluid collection in the midline abdomen has substantially improved from prior study. There is a small residual collection along  the patient's right hemipelvis measuring approximately 4.9 x 1.7 cm. Evaluation of additional collections is limited by lack of  both oral and IV contrast. Musculoskeletal. No acute displaced fractures. IMPRESSION: 1. Evaluation limited by lack of oral and IV contrast. 2. New walled off fluid collection in the patient's pelvis concerning for a developing abscess. Currently, this is amenable to percutaneous drainage as clinically indicated. 3. Status post placement of 2 percutaneous drains with significant interval improvement in the previously demonstrated large abdominal abscesses. Residual inflammatory changes and small pockets of free fluid are noted in the patient's low midline abdomen/high pelvis. 4. Diffuse wall thickening of the urinary bladder, favored to be reactive. Correlation with urinalysis is recommended to help exclude cystitis. 5. Small right-sided pleural effusion. 6. Hepatomegaly with probable underlying hepatic steatosis. 7. Nonspecific splenomegaly. 8. Enlarged retroperitoneal and mesenteric lymph nodes, presumably reactive. Aortic Atherosclerosis (ICD10-I70.0). Electronically Signed   By: Constance Holster M.D.   On: 11/07/2019 21:28    Anti-infectives: Anti-infectives (From admission, onward)   Start     Dose/Rate Route Frequency Ordered Stop   10/30/19 2130  piperacillin-tazobactam (ZOSYN) IVPB 3.375 g     3.375 g 12.5 mL/hr over 240 Minutes Intravenous Every 8 hours 10/30/19 2100     10/30/19 2030  piperacillin-tazobactam (ZOSYN) IVPB 2.25 g  Status:  Discontinued     2.25 g 100 mL/hr over 30 Minutes Intravenous Every 6 hours 10/30/19 2024 10/30/19 2100   10/30/19 1930  cefTRIAXone (ROCEPHIN) 2 g in sodium chloride 0.9 % 100 mL IVPB  Status:  Discontinued     2 g 200 mL/hr over 30 Minutes Intravenous  Once 10/30/19 1916 10/30/19 2024   10/30/19 1930  metroNIDAZOLE (FLAGYL) IVPB 500 mg  Status:  Discontinued     500 mg 100 mL/hr over 60 Minutes Intravenous  Once 10/30/19 1916  10/30/19 2024      Assessment/Plan: Assessment  C. difficile work-up for diarrhea Type 2 diabetes Hypertension Hx pulmonary embolism-chronic anticoagulation- Eliquis, now on hep gtt Hx of CVA with vision loss Chronic back pain - oxycodone/[facility] Asthma Sleep apnea Severe obesity BMI 58.93 Acute on chronic kidney disease; creatinine2.21>>1.98>>1.56>>1.28>>0.92  Acute diverticulitis with large abscess  Plan:  IR drain placement x3 on 10/31/19, Dr. Markus Daft: Drains #1 & #3 remain  WBC trending down. Persistent abscess in pelvis noted on yesterday's CT.   TKW:IOXBD diet.  Mechanically softened is reasonable.  Heart healthy/diabetic   ID: Zosyn 1/9>> continue for now.  Consider stopping once leukocytosis has resolved and there are no evidence of any abscesses and patient on solid diet.  Possibly in the coming week.  DVT: Fully anticoagulated given history of stroke and pulmonary embolism.  IV heparin per primary medicine service   Follow-up:TBD.  Hopefully patient can transition to oral antibiotics and follow-up at drain clinic and surgery in the coming week if she continues to improve.  At some point the patient would benefit from colonoscopy and segmental colonic resection of area of diverticulitis to break the cycle of attacks.  Last colonoscopy in 2017 was a poor quality.  Ideally would want to this current attack and infection / abscess to be resolved before proceeding with colonoscopy and then colectomy 6-8 weeks from now.  Would need medical clearance given her history of pulmonary embolism.  Cardiac clearance by Dr. Harrington Challenger   Plan:     Continue antibiotics.  Continue drain care  Diabetic control.  IR evaluation for pelvic drain placement today. .  LOS: 9 days    Kristen [name] 11/08/2019

## 2019-11-08 NOTE — Progress Notes (Signed)
ANTICOAGULATION CONSULT NOTE - follow up  Pharmacy Consult for Heparin Indication: H/O PE (on Eliquis PTA) - bridge therapy until oral anticoagulation can resume  Allergies  Allergen Reactions  . Cafergot Other (See Comments)    Chest pain; ergotamine-caffiene  . Glucophage [Metformin Hcl] Anaphylaxis  . Canagliflozin Swelling and Other (See Comments)    Legs Swell invokana Legs Swell    Patient Measurements: Height: 5\' 8"  (172.7 cm) Weight: (!) 387 lb 9.1 oz (175.8 kg) IBW/kg (Calculated) : 63.9 Heparin Dosing Weight: 108 kg  Vital Signs: Temp: 98.7 F (37.1 C) (01/18 0430) Temp Source: Oral (01/18 0430) BP: 135/82 (01/18 0430) Pulse Rate: 79 (01/18 0430)  Labs: Recent Labs    11/06/19 0816 11/06/19 0816 11/06/19 1841 11/07/19 0550 11/08/19 0509  HGB 9.3*   < >  --  8.9* 8.9*  HCT 31.1*  --   --  30.5* 30.2*  PLT 322  --   --  333 343  HEPARINUNFRC 0.77*   < > 0.58 0.36 0.30  CREATININE 0.90  --   --   --   --    < > = values in this interval not displayed.    Estimated Creatinine Clearance: 124 mL/min (by C-G formula based on SCr of 0.9 mg/dL).   Assessment:  21yr female admitted on 10/30/19 with performated sigmoid diverticulitis with abscess.  Patient on Eliquis PTA for h/o PE.  Eliquis held on admission pending potential procedures.  Abscess drains placed 1/10.    Pharmacy consulted to begin IV heparin without bolus for anticoagulation until cleared by general surgery for oral anticoagulation  Today, 11/08/19  Heparin level remains therapeutic at 0.3 with heparin infusing at 3200 units/hr  CBC: Hg low -8.9, pltc WNL  No reported bleeding or infusion problems noted per RN  Goal of Therapy:  Heparin level 0.3-0.7 units/ml Monitor platelets by anticoagulation protocol: Yes   Plan:   Continue IV heparin infusion at 3200 units/hr  Monitor daily heparin level & CBC  Transition back to PTA apixaban when cleared by surgery.  Tylasia Fletchall P. Legrand Como,  PharmD, BCPS Clinical Pharmacist 11/08/2019 7:25 AM  11/08/2019 7:23 AM

## 2019-11-09 ENCOUNTER — Inpatient Hospital Stay (HOSPITAL_COMMUNITY): Payer: PPO

## 2019-11-09 LAB — CBC
HCT: 32.8 % — ABNORMAL LOW (ref 36.0–46.0)
Hemoglobin: 9.7 g/dL — ABNORMAL LOW (ref 12.0–15.0)
MCH: 26.1 pg (ref 26.0–34.0)
MCHC: 29.6 g/dL — ABNORMAL LOW (ref 30.0–36.0)
MCV: 88.4 fL (ref 80.0–100.0)
Platelets: 299 10*3/uL (ref 150–400)
RBC: 3.71 MIL/uL — ABNORMAL LOW (ref 3.87–5.11)
RDW: 15.1 % (ref 11.5–15.5)
WBC: 16 10*3/uL — ABNORMAL HIGH (ref 4.0–10.5)
nRBC: 0 % (ref 0.0–0.2)

## 2019-11-09 LAB — GLUCOSE, CAPILLARY
Glucose-Capillary: 102 mg/dL — ABNORMAL HIGH (ref 70–99)
Glucose-Capillary: 107 mg/dL — ABNORMAL HIGH (ref 70–99)
Glucose-Capillary: 136 mg/dL — ABNORMAL HIGH (ref 70–99)
Glucose-Capillary: 204 mg/dL — ABNORMAL HIGH (ref 70–99)
Glucose-Capillary: 78 mg/dL (ref 70–99)
Glucose-Capillary: 84 mg/dL (ref 70–99)

## 2019-11-09 LAB — HEPARIN LEVEL (UNFRACTIONATED): Heparin Unfractionated: 0.59 IU/mL (ref 0.30–0.70)

## 2019-11-09 MED ORDER — FENTANYL CITRATE (PF) 100 MCG/2ML IJ SOLN
INTRAMUSCULAR | Status: AC
  Filled 2019-11-09: qty 2

## 2019-11-09 MED ORDER — FENTANYL CITRATE (PF) 100 MCG/2ML IJ SOLN
INTRAMUSCULAR | Status: AC | PRN
  Administered 2019-11-09 (×2): 50 ug via INTRAVENOUS

## 2019-11-09 MED ORDER — HEPARIN (PORCINE) 25000 UT/250ML-% IV SOLN
3200.0000 [IU]/h | INTRAVENOUS | Status: DC
  Administered 2019-11-09 – 2019-11-11 (×4): 3200 [IU]/h via INTRAVENOUS
  Filled 2019-11-09 (×7): qty 250

## 2019-11-09 MED ORDER — SODIUM CHLORIDE 0.9% FLUSH
5.0000 mL | Freq: Three times a day (TID) | INTRAVENOUS | Status: DC
  Administered 2019-11-09 – 2019-11-12 (×9): 5 mL

## 2019-11-09 MED ORDER — LIDOCAINE HCL (PF) 1 % IJ SOLN
INTRAMUSCULAR | Status: AC | PRN
  Administered 2019-11-09: 10 mL

## 2019-11-09 MED ORDER — MIDAZOLAM HCL 2 MG/2ML IJ SOLN
INTRAMUSCULAR | Status: AC | PRN
  Administered 2019-11-09 (×4): 1 mg via INTRAVENOUS

## 2019-11-09 MED ORDER — MIDAZOLAM HCL 2 MG/2ML IJ SOLN
INTRAMUSCULAR | Status: AC
  Filled 2019-11-09: qty 4

## 2019-11-09 NOTE — Progress Notes (Signed)
ANTICOAGULATION CONSULT NOTE - follow up  Pharmacy Consult for Heparin Indication: H/O PE (on Eliquis PTA) - bridge therapy until oral anticoagulation can resume  Allergies  Allergen Reactions  . Cafergot Other (See Comments)    Chest pain; ergotamine-caffiene  . Glucophage [Metformin Hcl] Anaphylaxis  . Canagliflozin Swelling and Other (See Comments)    Legs Swell invokana Legs Swell    Patient Measurements: Height: 5\' 8"  (172.7 cm) Weight: (!) 387 lb 9.1 oz (175.8 kg) IBW/kg (Calculated) : 63.9 Heparin Dosing Weight: 108 kg  Vital Signs: Temp: 98 F (36.7 C) (01/19 0529) Temp Source: Oral (01/19 0529) BP: 140/62 (01/19 0529) Pulse Rate: 76 (01/19 0529)  Labs: Recent Labs    11/07/19 0550 11/07/19 0550 11/08/19 0509 11/09/19 0459  HGB 8.9*   < > 8.9* 9.7*  HCT 30.5*  --  30.2* 32.8*  PLT 333  --  343 299  HEPARINUNFRC 0.36  --  0.30 0.59   < > = values in this interval not displayed.    Estimated Creatinine Clearance: 124 mL/min (by C-G formula based on SCr of 0.9 mg/dL).   Assessment: 76yr female admitted on 10/30/19 with performated sigmoid diverticulitis with abscess.  Patient on Eliquis PTA for h/o PE.  Eliquis held on admission pending potential procedures, anticoagulation transitioned to IV heparin.    Significant Events: -1/10: 3 Abscess drains placed (2 remain as of 1/19) -1/19: Planning for IR drainage of posterior pelvic fluid collection  Today, 11/09/19  HL = 0.59 remains therapeutic on heparin infusion of 3200 units/hr  Confirmed with RN that heparin infusing at correct rate. No signs/symptoms of bleeding.  CBC: Hgb low but stable, Plt WNL  Goal of Therapy:  Heparin level 0.3-0.7 units/ml Monitor platelets by anticoagulation protocol: Yes   Plan:   Continue heparin at current rate of 3200 units/hr  Monitor daily heparin level & CBC  Planning for IR drainage of posterior pelvic fluid collection. Per notes, IR to hold heparin 3 hours  prior to procedure.  Follow along for when safe to resume heparin post-IR procedure.   Follow along for when safe to transition back to PTA apixaban when cleared by surgery.  Lenis Noon, PharmD 11/09/19 9:21 AM

## 2019-11-09 NOTE — Progress Notes (Signed)
[facility] Progress Note     Subjective: CC: abdominal pain  Patient reports RLQ abdominal pain this AM. Had a smear yesterday but no true BM since the weekend. Denies nausea. Discussed pathophysiology of diverticulosis and diverticulitis and patient voiced understanding.   Objective: Vital signs in last 24 hours: Temp:  [97.9 F (36.6 C)-98.3 F (36.8 C)] 98 F (36.7 C) (01/19 0529) Pulse Rate:  [72-76] 76 (01/19 0529) Resp:  [15-18] 18 (01/19 0529) BP: (130-148)/(62-66) 140/62 (01/19 0529) SpO2:  [94 %-96 %] 96 % (01/19 0839) Last BM Date: 11/08/19  Intake/Output from previous day: 01/18 0701 - 01/19 0700 In: 683.7 [I.V.:548.6; IV Piggyback:135.1] Out: 1005 [Urine:1000; Drains:5] Intake/Output this shift: No intake/output data recorded.  PE: Gen:  Alert, NAD, pleasant Card:  Regular rate and rhythm Pulm:  Normal effort, clear to auscultation bilaterally Abd: Soft, ttp to RLQ and periumbilically, non-distended, drains present with small amount purulent appearing material  Skin: warm and dry, no rashes  Psych: A&Ox3   Lab Results:  Recent Labs    11/08/19 0509 11/09/19 0459  WBC 14.6* 16.0*  HGB 8.9* 9.7*  HCT 30.2* 32.8*  PLT 343 299   BMET No results for input(s): NA, K, CL, CO2, GLUCOSE, BUN, CREATININE, CALCIUM in the last 72 hours. PT/INR No results for input(s): LABPROT, INR in the last 72 hours. CMP     Component Value Date/Time   NA 136 11/06/2019 0816   NA 138 04/14/2017 1348   K 4.4 11/06/2019 0816   K 5.1 04/14/2017 1348   CL 104 11/06/2019 0816   CO2 26 11/06/2019 0816   CO2 27 04/14/2017 1348   GLUCOSE 129 (H) 11/06/2019 0816   GLUCOSE 257 (H) 04/14/2017 1348   BUN 14 11/06/2019 0816   BUN 16.3 04/14/2017 1348   CREATININE 0.90 11/06/2019 0816   CREATININE 1.14 (H) 04/26/2019 1205   CREATININE 1.2 (H) 04/14/2017 1348   CALCIUM 8.4 (L) 11/06/2019 0816   CALCIUM 9.8 04/14/2017 1348   PROT 6.3 (L) 11/05/2019 0525   PROT 7.1  04/14/2017 1348   ALBUMIN 2.0 (L) 11/05/2019 0525   ALBUMIN 3.4 (L) 04/14/2017 1348   AST 19 11/05/2019 0525   AST 24 04/14/2017 1348   ALT 25 11/05/2019 0525   ALT 24 04/14/2017 1348   ALKPHOS 268 (H) 11/05/2019 0525   ALKPHOS 75 04/14/2017 1348   BILITOT 0.3 11/05/2019 0525   BILITOT 0.40 04/14/2017 1348   GFRNONAA >60 11/06/2019 0816   GFRNONAA 55 (L) 04/26/2019 1205   GFRAA >60 11/06/2019 0816   GFRAA >60 04/26/2019 1205   Lipase     Component Value Date/Time   LIPASE 23 10/30/2019 1748       Studies/Results: CT ABDOMEN PELVIS WO CONTRAST  Result Date: 11/07/2019 CLINICAL DATA:  Right upper quadrant abdominal pain with fever. EXAM: CT ABDOMEN AND PELVIS WITHOUT CONTRAST TECHNIQUE: Multidetector CT imaging of the abdomen and pelvis was performed following the standard protocol without IV contrast. COMPARISON:  CT dated 10/30/2019 FINDINGS: Lower chest: There is a small right-sided pleural effusion. There is atelectasis at the lung bases.The heart size is borderline enlarged. The intracardiac blood pool is hypodense relative to the adjacent myocardium consistent with anemia. Hepatobiliary: The liver is enlarged with borderline hepatic steatosis. Status post cholecystectomy.There is no biliary ductal dilation. Pancreas: Normal contours without ductal dilatation. No peripancreatic fluid collection. Spleen: The spleen is enlarged measuring approximately 15 cm craniocaudad. Adrenals/Urinary Tract: --Adrenal glands: No adrenal hemorrhage. --Right kidney/ureter: No  hydronephrosis or perinephric hematoma. --Left kidney/ureter: No hydronephrosis or perinephric hematoma. --Urinary bladder: Bladder is underdistended, however there is diffuse bladder wall thickening. Stomach/Bowel: --Stomach/Duodenum: No hiatal hernia or other gastric abnormality. Normal duodenal course and caliber. --Small bowel: There is a matted appearance of multiple small bowel loops in the lobe midline abdomen. Evaluation is  limited by lack of IV and oral contrast. Developing fistula in this location would be difficult to exclude at this time. --Colon: Again noted are findings concerning for perforated sigmoid diverticulitis. There is moderate wall thickening of the sigmoid colon. --Appendix: Not visualized. No right lower quadrant inflammation or free fluid. Vascular/Lymphatic: Atherosclerotic calcification is present within the non-aneurysmal abdominal aorta, without hemodynamically significant stenosis. --there are multiple enlarged retroperitoneal lymph nodes, favored to be reactive in etiology. --there prominent mesenteric lymph nodes, favored to be reactive in etiology. --prominent pelvic lymph nodes are noted, favored to be reactive. Reproductive: There is a masslike structure in the patient's left hemipelvis measuring approximately 3.8 x 2.8 cm. This is favored to represent the left ovary. Other: There is a well-formed fluid collection in the patient's pelvis measuring approximately 5.8 x 6.3 cm. This is favored to represent developing abscess. Extensive inflammatory changes are again noted in the patient's low midline abdomen/high pelvis. Two percutaneous drains are noted. The previously demonstrated fluid collection in the midline abdomen has substantially improved from prior study. There is a small residual collection along the patient's right hemipelvis measuring approximately 4.9 x 1.7 cm. Evaluation of additional collections is limited by lack of both oral and IV contrast. Musculoskeletal. No acute displaced fractures. IMPRESSION: 1. Evaluation limited by lack of oral and IV contrast. 2. New walled off fluid collection in the patient's pelvis concerning for a developing abscess. Currently, this is amenable to percutaneous drainage as clinically indicated. 3. Status post placement of 2 percutaneous drains with significant interval improvement in the previously demonstrated large abdominal abscesses. Residual inflammatory  changes and small pockets of free fluid are noted in the patient's low midline abdomen/high pelvis. 4. Diffuse wall thickening of the urinary bladder, favored to be reactive. Correlation with urinalysis is recommended to help exclude cystitis. 5. Small right-sided pleural effusion. 6. Hepatomegaly with probable underlying hepatic steatosis. 7. Nonspecific splenomegaly. 8. Enlarged retroperitoneal and mesenteric lymph nodes, presumably reactive. Aortic Atherosclerosis (ICD10-I70.0). Electronically Signed   By: Constance Holster M.D.   On: 11/07/2019 21:28    Anti-infectives: Anti-infectives (From admission, onward)   Start     Dose/Rate Route Frequency Ordered Stop   10/30/19 2130  piperacillin-tazobactam (ZOSYN) IVPB 3.375 g     3.375 g 12.5 mL/hr over 240 Minutes Intravenous Every 8 hours 10/30/19 2100     10/30/19 2030  piperacillin-tazobactam (ZOSYN) IVPB 2.25 g  Status:  Discontinued     2.25 g 100 mL/hr over 30 Minutes Intravenous Every 6 hours 10/30/19 2024 10/30/19 2100   10/30/19 1930  cefTRIAXone (ROCEPHIN) 2 g in sodium chloride 0.9 % 100 mL IVPB  Status:  Discontinued     2 g 200 mL/hr over 30 Minutes Intravenous  Once 10/30/19 1916 10/30/19 2024   10/30/19 1930  metroNIDAZOLE (FLAGYL) IVPB 500 mg  Status:  Discontinued     500 mg 100 mL/hr over 60 Minutes Intravenous  Once 10/30/19 1916 10/30/19 2024       Assessment/Plan Type 2 diabetes Hypertension Hx pulmonary embolism-chronic anticoagulation- Eliquis, now on hep gtt Hx of CVA with vision loss Chronic back pain - oxycodone/[facility] Asthma Sleep  apnea Severe obesity BMI 58.93 Acute on CKD  Acute diverticulitis with large abscess IR drain placement x3 on 10/31/19, Dr. Markus Daft - Drains #1 & #3 remain - CT 1/17 with new walled off collection in pelvis - plan for IR drain today  - WBC 16 from 14.6, afebrile - patient clinically still having some abdominal pain in RLQ - ok to resume soft  diet after procedure  LDJ:TTSVX diet.  Mechanically softened is reasonable.  Heart healthy/diabetic  ID: Zosyn 1/9>> continue for now.  Consider stopping once leukocytosis has resolved and there are no evidence of any abscesses and patient on solid diet.  Possibly in the coming week. DVT: heparin gtt per  Follow-up:TBD.     Plan: Hopefully patient can transition to oral antibiotics and follow-up at drain clinic and surgery in the coming week if she continues to improve.  At some point the patient would benefit from colonoscopy and segmental colonic resection of area of diverticulitis to break the cycle of attacks.  Last colonoscopy in 2017 was a poor quality.  Ideally would want to this current attack and infection / abscess to be resolved before proceeding with colonoscopy and then colectomy 6-8 weeks from now.  Would need medical clearance given her history of pulmonary embolism.  Cardiac clearance by Dr. Harrington Challenger  LOS: 10 days    Brigid Re , [facility] 11/09/2019, 9:23 AM Please see Amion for pager number during day hours 7:00am-4:30pm

## 2019-11-09 NOTE — Progress Notes (Signed)
PHARMACY - HEPARIN (brief note)  Patient with h/o PE on IV heparin for bridge therapy until oral anticoagulation resumed.  IV heparin held earlier today for IR procedure.  Contacted Dr Annamaria Boots and received orders to resume IV heparin at this time.    Plan: Resume IV heparin gtt @ 3200 units/hr Follow heparin level and CBC daily  Leone Haven, PharmD

## 2019-11-09 NOTE — Care Management Important Message (Signed)
Important Message  Patient Details IM Letter given to Roque Lias SW Case Manager to present to the Patient Name: Kristen [name] MRN: [id number] Date of Birth: March 24, 1966   Medicare Important Message Given:  Yes     Kerin Salen 11/09/2019, 12:40 PM

## 2019-11-09 NOTE — Progress Notes (Signed)
Pt's sister Izora Gala called for update. RN provided update. No further questions at this time.

## 2019-11-09 NOTE — Progress Notes (Addendum)
PROGRESS NOTE    Kristen [name]  ATF:[id number] DOB: 02-18-1966 DOA: 10/30/2019 PCP: Lawerance Cruel, MD   Brief Narrative:54 year old Caucasian female with PMH of morbid obesity, type 2 diabetes mellitus, chronic pain on opioids, hypertension, depression, anxiety, asthma, s/p cholecystectomy, CVA with residual vision loss, PCOS, sleep apnea who was brought to the ED by EMS from home for worsening abdominal painassociated with diarrhea. Recently treated for UTI with no improvement in abd pain.[place] home and largely immobile. Reported chronicdepressionand sometimes has suicidal ideations but none now. ED Course: Afebrile,noted to be hypertensive, tachycardic.Found to have leukocytosis of 26K and blood sugar elevated in the 300s. CT abdomen pelvis without contrast (no contrast due to renal dysfunction)showed perforated sigmoid diverticulitis with a large 12 cm abscess in addition to complex fluid collections extending into the patient's pelvis. ED provider spoke to surgeon on call Dr. Gala Lewandowsky who indicated no surgical need at this time and recommended IR consult for drainage of the abscess. Hospital course: Patient [facility] and IR consulted. Patient underwent CT guided drain x3 placement on 1/10.   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  Draining purulent material. C. difficile negative On Zosyn Continue Dilaudid for pain control,she still  believes she needs a Dilaudid for intense pain. Drains placed by IR Followed by surgery WBCtrending down slowly.  White count 16.0 today  Advance diet per general surgery. CT scan 11/07/2019 new abscess.  Patient to have drain placed by IR today.   #2 AKI on CKD stage III A-resolved with IV fluids. Last creatinine we have is 0.90.   #3 type 2 diabetes with hyperglycemia -continue Lantus 60 units nightly with SSI. Patient was n.p.o. part of the time yesterday for drain placement but unfortunately it was not done due to the fact she was still on heparin.  So her blood sugar is running low.  But her diet will be restarted today after the procedure by the IR.   CBG (last 3)  Recent Labs    11/09/19 0425 11/09/19 0756 11/09/19 1148  GLUCAP 78 102* 84     #4 essential hypertension-blood pressure 140/62.  Continue ACE inhibitor.   #5 history of asthma stable continue inhalers and encourage incentive spirometry. Continue Singulair and Dulera.  #6 morbid obesity needs ongoing counseling regarding weight loss  #7 depression continue home meds Zoloft 200 mg daily.  #8 pyuria with rare bacteriuria initially treated for UTI.   #9 thrush-nystatin ordered   #10 history of PE was on Eliquis as an outpatient which is on hold for possible need for surgical intervention. Continue IV heparin.    Nutrition Problem: Increased nutrient needs Etiology: acute illness     Signs/Symptoms: estimated needs    Interventions: Prostat(Ensure Max)  Estimated body mass index is 58.93 kg/m as calculated from the following:   Height as of this encounter: 5\' 8"  (1.727 m).   Weight as of this encounter: 175.8 kg.  DVT prophylaxis:Heparin Code  Status:Full code discussed with patient  Family Communication:Discussed with patient   disposition Plan-patient came from home.  She lives with her sister but according to her the sister worked all day.  She would be discharged to a  skilled nursing facility.  She is still on clear liquids not been able to tolerate a regular diet yet for safe discharge.  Patient will also need bariatric bed at the time of discharge.  Consultants:  IR and general surgery   Procedures:3 abdominal drains placed by IR  AntimicrobialsZosyn  Subjective: Resting in bed still nauseous and having abdominal pain Drains in place emptied last night  Objective: Vitals:   11/08/19 1948 11/08/19 2018 11/09/19 0529 11/09/19 0839  BP:  (!) 148/65 140/62   Pulse:  74 76   Resp:  18 18   Temp:  98.3 F (36.8 C) 98 F (36.7 C)   TempSrc:  Oral Oral   SpO2: 94% 94% 95% 96%  Weight:      Height:        Intake/Output Summary (Last 24 hours) at 11/09/2019 1148 Last data filed at 11/09/2019 1022 Gross per 24 hour  Intake 683.73 ml  Output 1705 ml  Net -1021.27 ml   Filed Weights   10/31/19 0200  Weight: (!) 175.8 kg    Examination:  General exam: Appears calm and comfortable  Respiratory system: Clear to auscultation. Respiratory effort normal. Cardiovascular system: S1 & S2 heard, RRR. No JVD, murmurs, rubs, gallops or clicks. No pedal edema. Gastrointestinal system: Abdomen is nondistended, soft and nontender. No organomegaly or masses felt. Normal bowel sounds heard.  2 drains in place both are empty. Central nervous system: Alert and oriented. No focal neurological deficits. Extremities: Symmetric 5 x 5 power. Skin: No rashes, lesions or ulcers Psychiatry: Judgement and insight appear normal. Mood & affect appropriate.     Data Reviewed: I have personally reviewed following labs and imaging studies  CBC: Recent Labs  Lab 11/05/19 0525 11/06/19 0816 11/07/19 0550 11/08/19 0509 11/09/19 0459  WBC 21.7* 19.0* 15.2* 14.6* 16.0*  HGB 9.3* 9.3* 8.9* 8.9* 9.7*  HCT 32.0* 31.1* 30.5* 30.2* 32.8*  MCV 87.4 87.1 87.9 87.0 88.4  PLT 337 322 333 343 585   Basic Metabolic Panel: Recent Labs  Lab 11/03/19 0618  11/04/19 0746 11/05/19 0525 11/06/19 0816 11/07/19 0550  NA 135 135   135 136 136  --   K 4.3 4.3   4.2 4.1 4.4  --   CL 105 104   104 104 104  --   CO2 22 24   24 23 26   --   GLUCOSE 94 130*   131* 135* 129*  --   BUN 28* 21*   21* 17 14  --   CREATININE 1.28* 1.04*   0.92 0.92 0.90  --   CALCIUM 8.2* 8.3*   8.2* 8.5* 8.4*  --   MG 1.9 1.7 1.6* 1.7 1.7  PHOS 4.2 3.2 2.8 3.2 3.2   GFR: Estimated Creatinine Clearance: 124 mL/min (by C-G formula based on SCr of 0.9 mg/dL). Liver Function Tests: Recent Labs  Lab 11/03/19 0618 11/04/19 0746 11/05/19 0525  AST 31 25 19   ALT 33 34 25  ALKPHOS 201* 274* 268*  BILITOT 0.3 0.4 0.3  PROT 6.1* 6.1* 6.3*  ALBUMIN 1.9* 1.9* 2.0*   No results for input(s): LIPASE, AMYLASE in the last 168 hours. No results for input(s): AMMONIA in the last 168 hours. Coagulation Profile: Recent Labs  Lab 11/03/19 0618 11/04/19 0746  INR 1.3* 1.2   Cardiac Enzymes: No results for input(s): CKTOTAL, CKMB, CKMBINDEX, TROPONINI in the last 168 hours. BNP (last 3 results) No results for input(s): PROBNP in the last 8760 hours. HbA1C: No results for input(s): HGBA1C in the last 72 hours. CBG: Recent Labs  Lab 11/08/19 1628 11/08/19 2102 11/09/19 0008 11/09/19 0425 11/09/19 0756  GLUCAP 139* 301* 136* 78 102*   Lipid Profile: No results for input(s): CHOL, HDL, LDLCALC, TRIG, CHOLHDL, LDLDIRECT in the last 72 hours. Thyroid Function Tests: No results for input(s): TSH, T4TOTAL, FREET4, T3FREE, THYROIDAB in the last 72 hours. Anemia Panel: No results for input(s): VITAMINB12, FOLATE, FERRITIN, TIBC, IRON, RETICCTPCT in the last 72 hours. Sepsis Labs: No results for input(s): PROCALCITON, LATICACIDVEN in the last 168 hours.  Recent Results (from the past 240 hour(s))  Respiratory Panel by RT PCR (Flu A&B, Covid) - Nasopharyngeal Swab     Status: None   Collection Time: 10/30/19  7:25 PM   Specimen: Nasopharyngeal Swab  Result Value Ref  Range Status   SARS Coronavirus 2 by RT PCR NEGATIVE NEGATIVE Final    Comment: (NOTE) SARS-CoV-2 target nucleic acids are NOT DETECTED. The SARS-CoV-2 RNA is generally detectable in upper respiratoy specimens during the acute phase of infection. The lowest concentration of SARS-CoV-2 viral copies this assay can detect is 131 copies/mL. A negative result does not preclude SARS-Cov-2 infection and should not be used as the sole basis for treatment or other patient management decisions. A negative result may occur with  improper specimen collection/handling, submission of specimen other than nasopharyngeal swab, presence of viral mutation(s) within the areas targeted by this assay, and inadequate number of viral copies (<131 copies/mL). A negative result must be combined with clinical observations, patient history, and epidemiological information. The expected result is Negative. Fact Sheet for Patients:  PinkCheek.be Fact Sheet for Healthcare Providers:  GravelBags.it This test is not yet ap proved or cleared by the [place] FDA and  has been authorized for detection and/or diagnosis of SARS-CoV-2 by FDA under an Emergency Use Authorization (EUA). This EUA will remain  in effect (meaning this test can be used) for the duration of the COVID-19 declaration under Section 564(b)(1) of the Act, 21 U.S.C. section 360bbb-3(b)(1), unless the authorization is terminated or revoked sooner.    Influenza A by PCR NEGATIVE NEGATIVE Final   Influenza B by PCR NEGATIVE NEGATIVE Final    Comment: (NOTE) The Xpert Xpress SARS-CoV-2/FLU/RSV assay is intended as an aid in  the diagnosis of influenza from Nasopharyngeal swab specimens and  should not be used as a sole basis for treatment. Nasal washings and  aspirates are unacceptable for Xpert Xpress SARS-CoV-2/FLU/RSV  testing. Fact Sheet for  Patients: PinkCheek.be Fact Sheet for Healthcare Providers: GravelBags.it This test is not yet approved or cleared by the [place] FDA and  has been authorized for detection and/or diagnosis of SARS-CoV-2 by  FDA under an Emergency Use Authorization (EUA). This EUA will remain  in effect (meaning this test can be used) for the duration of the  Covid-19 declaration under Section 564(b)(1) of the Act, 21  U.S.C. section 360bbb-3(b)(1), unless the authorization is  terminated or revoked. Performed at [facility], [place] [address], [place], [place] [address]   Culture, blood (routine x 2)     Status: None   Collection Time: 10/30/19  8:13 PM   Specimen: BLOOD  Result Value Ref Range Status  Specimen Description BLOOD LEFT HAND  Final   Special Requests   Final    BOTTLES DRAWN AEROBIC AND ANAEROBIC Blood Culture adequate volume Performed at [facility] [address] [place], [place] [address]    Culture NO GROWTH 7 DAYS  Final   Report Status 11/07/2019 FINAL  Final  Culture, blood (routine x 2)     Status: None   Collection Time: 10/30/19 11:00 PM   Specimen: BLOOD RIGHT HAND  Result Value Ref Range Status   Specimen Description BLOOD RIGHT HAND  Final   Special Requests   Final    BOTTLES DRAWN AEROBIC AND ANAEROBIC Blood Culture adequate volume Performed at [facility] [address], [place], [place] [address]    Culture NO GROWTH 7 DAYS  Final   Report Status 11/07/2019 FINAL  Final  C difficile quick scan w PCR reflex     Status: None   Collection Time: 10/31/19  8:16 AM   Specimen: STOOL  Result Value Ref Range Status   C Diff antigen NEGATIVE NEGATIVE Final   C Diff toxin NEGATIVE NEGATIVE Final   C Diff interpretation No C. difficile detected.  Final    Comment: Performed at [facility], [place] [address], [place], [place]  [address]  Aerobic/Anaerobic Culture (surgical/deep wound)     Status: None   Collection Time: 10/31/19  4:11 PM   Specimen: Abscess  Result Value Ref Range Status   Specimen Description   Final    ABSCESS Performed at [facility] [address] [place], [place] [address]    Special Requests   Final    1 LEFT ABDOMINAL LATERAL Performed at [facility], [place] [address], [place], [place] [address]    Gram Stain   Final    ABUNDANT WBC PRESENT, PREDOMINANTLY PMN ABUNDANT GRAM POSITIVE COCCI IN PAIRS IN CLUSTERS IN CHAINS FEW GRAM NEGATIVE COCCOBACILLI RARE GRAM POSITIVE RODS Performed at [facility], [place] [address], [place], [place] [address]    Culture   Final    MODERATE ESCHERICHIA COLI MIXED ANAEROBIC FLORA PRESENT.  CALL LAB IF FURTHER IID REQUIRED.    Report Status 11/04/2019 FINAL  Final   Organism ID, Bacteria ESCHERICHIA COLI  Final      Susceptibility   Escherichia coli - MIC*    AMPICILLIN >=32 RESISTANT Resistant     CEFAZOLIN <=4 SENSITIVE Sensitive     CEFEPIME <=0.12 SENSITIVE Sensitive     CEFTAZIDIME <=1 SENSITIVE Sensitive     CEFTRIAXONE <=0.25 SENSITIVE Sensitive     CIPROFLOXACIN >=4 RESISTANT Resistant     GENTAMICIN >=16 RESISTANT Resistant     IMIPENEM <=0.25 SENSITIVE Sensitive     TRIMETH/SULFA >=320 RESISTANT Resistant     AMPICILLIN/SULBACTAM >=32 RESISTANT Resistant     PIP/TAZO <=4 SENSITIVE Sensitive     * MODERATE ESCHERICHIA COLI  Aerobic/Anaerobic Culture (surgical/deep wound)     Status: None   Collection Time: 10/31/19  4:19 PM   Specimen: Abscess  Result Value Ref Range Status   Specimen Description   Final    ABSCESS Performed at [facility] [address] [place],  [address]    Special Requests   Final    2 LEFT ABDOMEN MEDIAL Performed at [name], [place] [address] [place], [place] [address]    Gram Stain   Final    FEW WBC  PRESENT,BOTH PMN AND MONONUCLEAR  ABUNDANT GRAM POSITIVE COCCI IN PAIRS IN CHAINS RARE GRAM POSITIVE RODS RARE GRAM NEGATIVE RODS    Culture   Final    ABUNDANT ACTINOMYCES NEUII ABUNDANT PREVOTELLA SPECIES BETA LACTAMASE POSITIVE Performed at [facility], [place] [address] [place],  [address]    Report Status 11/08/2019 FINAL  Final         Radiology Studies: CT ABDOMEN PELVIS WO CONTRAST  Result Date: 11/07/2019 CLINICAL DATA:  Right upper quadrant abdominal pain with fever. EXAM: CT ABDOMEN AND PELVIS WITHOUT CONTRAST TECHNIQUE: Multidetector CT imaging of the abdomen and pelvis was performed following the standard protocol without IV contrast. COMPARISON:  CT dated 10/30/2019 FINDINGS: Lower chest: There is a small right-sided pleural effusion. There is atelectasis at the lung bases.The heart size is borderline enlarged. The intracardiac blood pool is hypodense relative to the adjacent myocardium consistent with anemia. Hepatobiliary: The liver is enlarged with borderline hepatic steatosis. Status post cholecystectomy.There is no biliary ductal dilation. Pancreas: Normal contours without ductal dilatation. No peripancreatic fluid collection. Spleen: The spleen is enlarged measuring approximately 15 cm craniocaudad. Adrenals/Urinary Tract: --Adrenal glands: No adrenal hemorrhage. --Right kidney/ureter: No hydronephrosis or perinephric hematoma. --Left kidney/ureter: No hydronephrosis or perinephric hematoma. --Urinary bladder: Bladder is underdistended, however there is diffuse bladder wall thickening. Stomach/Bowel: --Stomach/Duodenum: No hiatal hernia or other gastric abnormality. Normal duodenal course and caliber. --Small bowel: There is a matted appearance of multiple small bowel loops in the lobe midline abdomen. Evaluation is limited by lack of IV and oral contrast. Developing fistula in this location would be difficult to exclude at this time. --Colon: Again noted are  findings concerning for perforated sigmoid diverticulitis. There is moderate wall thickening of the sigmoid colon. --Appendix: Not visualized. No right lower quadrant inflammation or free fluid. Vascular/Lymphatic: Atherosclerotic calcification is present within the non-aneurysmal abdominal aorta, without hemodynamically significant stenosis. --there are multiple enlarged retroperitoneal lymph nodes, favored to be reactive in etiology. --there prominent mesenteric lymph nodes, favored to be reactive in etiology. --prominent pelvic lymph nodes are noted, favored to be reactive. Reproductive: There is a masslike structure in the patient's left hemipelvis measuring approximately 3.8 x 2.8 cm. This is favored to represent the left ovary. Other: There is a well-formed fluid collection in the patient's pelvis measuring approximately 5.8 x 6.3 cm. This is favored to represent developing abscess. Extensive inflammatory changes are again noted in the patient's low midline abdomen/high pelvis. Two percutaneous drains are noted. The previously demonstrated fluid collection in the midline abdomen has substantially improved from prior study. There is a small residual collection along the patient's right hemipelvis measuring approximately 4.9 x 1.7 cm. Evaluation of additional collections is limited by lack of both oral and IV contrast. Musculoskeletal. No acute displaced fractures. IMPRESSION: 1. Evaluation limited by lack of oral and IV contrast. 2. New walled off fluid collection in the patient's pelvis concerning for a developing abscess. Currently, this is amenable to percutaneous drainage as clinically indicated. 3. Status post placement of 2 percutaneous drains with significant interval improvement in the previously demonstrated large abdominal abscesses. Residual inflammatory changes and small pockets of free fluid are noted in the patient's low midline abdomen/high pelvis. 4. Diffuse wall thickening of the urinary  bladder, favored to be reactive. Correlation with urinalysis is recommended to help exclude cystitis. 5. Small right-sided pleural effusion. 6. Hepatomegaly with probable underlying hepatic steatosis. 7. Nonspecific splenomegaly. 8. Enlarged retroperitoneal and mesenteric lymph nodes, presumably reactive. Aortic Atherosclerosis (ICD10-I70.0). Electronically Signed   By:  Constance Holster M.D.   On: 11/07/2019 21:28        Scheduled Meds:  acetaminophen  1,000 mg Oral Q6H   atorvastatin  40 mg Oral Daily   cholecalciferol  2,000 Units Oral Daily   feeding supplement (PRO-STAT SUGAR FREE 64)  30 mL Oral BID   insulin aspart  0-20 Units Subcutaneous Q4H   insulin aspart  10 Units Subcutaneous TID WC   insulin glargine  60 Units Subcutaneous QHS   lip balm  1 application Topical BID   methocarbamol  1,000 mg Oral TID   mometasone-formoterol  2 puff Inhalation BID   montelukast  10 mg Oral QHS   nystatin  5 mL Oral QID   polyethylene glycol  17 g Oral Daily   Ensure Max Protein  11 oz Oral BID   saccharomyces boulardii  250 mg Oral BID   sertraline  200 mg Oral Daily   sodium chloride flush  3 mL Intravenous Q12H   sodium chloride flush  3 mL Intravenous Q12H   sodium chloride flush  5 mL Intracatheter Q8H   sodium chloride flush  5 mL Intracatheter Q8H   Continuous Infusions:  sodium chloride     heparin Stopped (11/09/19 0930)   piperacillin-tazobactam (ZOSYN)  IV 3.375 g (11/09/19 0506)     LOS: 10 days     Georgette Shell, MD [facility]  If 7PM-7AM, please contact night-coverage [email or web address] Password [facility] 11/09/2019, 11:48 AM

## 2019-11-09 NOTE — Procedures (Signed)
Pelvic abscess  S/p CT drain No comp Stable ebl min 30cc pus asp, cx sent Full report in pacs

## 2019-11-09 NOTE — Progress Notes (Signed)
PT Cancellation Note  Patient Details Name: Kristen [name] MRN: [id number] DOB: May 01, 1966   Cancelled Treatment:    Reason Eval/Treat Not Completed: Pain limiting ability to participate Pt reports nausea and pain and to have procedure later today.  Pt politely declines today and hopeful for improvement in symptoms after procedure.   Dimitris Shanahan,KATHrine E 11/09/2019, 10:56 AM Jannette Spanner PT, DPT Acute Rehabilitation Services Office: [phone]

## 2019-11-10 LAB — GLUCOSE, CAPILLARY
Glucose-Capillary: 112 mg/dL — ABNORMAL HIGH (ref 70–99)
Glucose-Capillary: 120 mg/dL — ABNORMAL HIGH (ref 70–99)
Glucose-Capillary: 165 mg/dL — ABNORMAL HIGH (ref 70–99)
Glucose-Capillary: 198 mg/dL — ABNORMAL HIGH (ref 70–99)
Glucose-Capillary: 202 mg/dL — ABNORMAL HIGH (ref 70–99)
Glucose-Capillary: 224 mg/dL — ABNORMAL HIGH (ref 70–99)

## 2019-11-10 LAB — CBC
HCT: 31.5 % — ABNORMAL LOW (ref 36.0–46.0)
Hemoglobin: 9.3 g/dL — ABNORMAL LOW (ref 12.0–15.0)
MCH: 26 pg (ref 26.0–34.0)
MCHC: 29.5 g/dL — ABNORMAL LOW (ref 30.0–36.0)
MCV: 88 fL (ref 80.0–100.0)
Platelets: 301 10*3/uL (ref 150–400)
RBC: 3.58 MIL/uL — ABNORMAL LOW (ref 3.87–5.11)
RDW: 15.2 % (ref 11.5–15.5)
WBC: 14 10*3/uL — ABNORMAL HIGH (ref 4.0–10.5)
nRBC: 0 % (ref 0.0–0.2)

## 2019-11-10 LAB — BASIC METABOLIC PANEL
Anion gap: 8 (ref 5–15)
BUN: 14 mg/dL (ref 6–20)
CO2: 27 mmol/L (ref 22–32)
Calcium: 8.7 mg/dL — ABNORMAL LOW (ref 8.9–10.3)
Chloride: 103 mmol/L (ref 98–111)
Creatinine, Ser: 0.88 mg/dL (ref 0.44–1.00)
GFR calc Af Amer: >60 mL/min (ref >60)
GFR calc non Af Amer: >60 mL/min (ref >60)
Glucose, Bld: 118 mg/dL — ABNORMAL HIGH (ref 70–99)
Potassium: 4.2 mmol/L (ref 3.5–5.1)
Sodium: 138 mmol/L (ref 135–145)

## 2019-11-10 LAB — PREALBUMIN: Prealbumin: 7.8 mg/dL — ABNORMAL LOW (ref 18–38)

## 2019-11-10 LAB — MAGNESIUM: Magnesium: 1.8 mg/dL (ref 1.7–2.4)

## 2019-11-10 LAB — HEPARIN LEVEL (UNFRACTIONATED): Heparin Unfractionated: 0.53 IU/mL (ref 0.30–0.70)

## 2019-11-10 MED ORDER — MAGNESIUM SULFATE 2 GM/50ML IV SOLN
2.0000 g | Freq: Once | INTRAVENOUS | Status: AC
  Administered 2019-11-10: 2 g via INTRAVENOUS
  Filled 2019-11-10: qty 50

## 2019-11-10 NOTE — Progress Notes (Signed)
RN noted increased drainage from JP drain # 3 on right side of abdomen. Output >182mm bright red in color; previously cloudy purulent or yellowish. MD and surgeon following pt were notified.

## 2019-11-10 NOTE — Progress Notes (Signed)
Referring Physician(s): Connor,C  Supervising Physician: Corrie Mckusick  Patient Status:  Kristen [name] - In-pt  Chief Complaint:  Abdominal pain/abscesses  Subjective: Patient continues to have some abdominal pain but not quite as bad since latest pelvic drain placed.  Also with some occasional nausea.  She is passing gas.   Allergies: Cafergot, Glucophage [metformin hcl], and Canagliflozin  Medications: Prior to Admission medications   Medication Sig Start Date End Date Taking? Authorizing Provider  acetaminophen (TYLENOL) 500 MG tablet Take 1,000 mg by mouth every 6 (six) hours as needed for moderate pain.   Yes [provider]  albuterol (ACCUNEB) 1.25 MG/3ML nebulizer solution Inhale 1 ampule into the lungs 3 (three) times daily as needed for wheezing or shortness of breath.  12/12/10  Yes [provider]  albuterol (VENTOLIN HFA) 108 (90 Base) MCG/ACT inhaler Inhale 2 puffs into the lungs every 4 (four) hours as needed for wheezing or shortness of breath.  01/04/14  Yes [provider]  amoxicillin-clavulanate (AUGMENTIN) 875-125 MG tablet Take 1 tablet by mouth 2 (two) times daily. 10/25/19  Yes [provider]  apixaban (ELIQUIS) 5 MG TABS tablet Take two tabs twice a day for 7 days, then take one tab twice a day for a year. Patient taking differently: Take 2.5 mg by mouth 2 (two) times daily.  03/24/17  Yes Florencia Reasons, MD  aspirin EC 81 MG tablet Take 81 mg by mouth daily.   Yes [provider]  atorvastatin (LIPITOR) 40 MG tablet Take 40 mg by mouth daily.   Yes [provider]  buPROPion (WELLBUTRIN XL) 150 MG 24 hr tablet Take 150 mg by mouth every morning. 09/13/19  Yes [provider]  carisoprodol (SOMA) 350 MG tablet TAKE 1 TABLET (350 MG TOTAL) BY MOUTH 3 (THREE) TIMES DAILY. 09/15/19  Yes Meredith Staggers, MD  Cholecalciferol (VITAMIN D3) 2000 units capsule Take 2,000 Units by mouth daily.    Yes [provider]  Fluticasone-Salmeterol (ADVAIR) 500-50 MCG/DOSE AEPB Inhale 1 puff into the lungs every 12 (twelve) hours.   Yes [provider]  insulin aspart (NOVOLOG FLEXPEN) 100 UNIT/ML FlexPen Inject 15 Units into the skin 3 (three) times daily with meals. Patient taking differently: Inject 44 Units into the skin 3 (three) times daily with meals.  03/24/17  Yes Florencia Reasons, MD  insulin glargine (LANTUS) 100 UNIT/ML injection Inject 80 Units into the skin at bedtime.    Yes [provider]  lisinopril (PRINIVIL,ZESTRIL) 20 MG tablet Take 1 tablet (20 mg total) by mouth daily. 03/24/17 10/30/19 Yes Florencia Reasons, MD  montelukast (SINGULAIR) 10 MG tablet Take 10 mg by mouth at bedtime.    Yes [provider]  oxybutynin (DITROPAN) 5 MG tablet Take 5 mg by mouth 2 (two) times daily.  03/13/16  Yes [provider]  Oxycodone HCl 10 MG TABS Take 1 tablet (10 mg total) by mouth every 8 (eight) hours as needed. Patient taking differently: Take 10 mg by mouth every 8 (eight) hours as needed (pain).  08/31/19  Yes Bayard Hugger, NP  promethazine (PHENERGAN) 25 MG tablet Take 25 mg by mouth every 6 (six) hours as needed for nausea or vomiting.    Yes [provider]  senna-docusate (SENOKOT-S) 8.6-50 MG tablet Take 1 tablet by mouth at bedtime. 03/24/17  Yes Florencia Reasons, MD  sertraline (ZOLOFT) 100 MG tablet Take 200 mg by mouth daily.    Yes [provider]  BD PEN NEEDLE NANO U/F 32G X 4 MM MISC 2 (two) times daily. as directed 03/13/16   [provider]  nystatin cream (MYCOSTATIN) Apply 1 application topically 2 (two) times daily. Patient not taking: Reported on 10/31/2019 03/11/18   Huel Cote, NP     Vital Signs: BP 140/63 (BP Location: Right Arm)   Pulse 75   Temp 98.5 F (36.9 C)   Resp 18   Ht 5\' 8"  (1.727 m)   Wt (!) 387 lb 9.1 oz (175.8 kg)   SpO2 94%   BMI 58.93 kg/m   Physical Exam awake, alert.  Right ,mid and left transgluteal drains all  intact, output ranging from 10 to 120 cc of turbid cream-colored/beige fluid, insertion sites minimally tender.  Drains are being irrigated.  Imaging: CT ABDOMEN PELVIS WO CONTRAST  Result Date: 11/07/2019 CLINICAL DATA:  Right upper quadrant abdominal pain with fever. EXAM: CT ABDOMEN AND PELVIS WITHOUT CONTRAST TECHNIQUE: Multidetector CT imaging of the abdomen and pelvis was performed following the standard protocol without IV contrast. COMPARISON:  CT dated 10/30/2019 FINDINGS: Lower chest: There is a small right-sided pleural effusion. There is atelectasis at the lung bases.The heart size is borderline enlarged. The intracardiac blood pool is hypodense relative to the adjacent myocardium consistent with anemia. Hepatobiliary: The liver is enlarged with borderline hepatic steatosis. Status post cholecystectomy.There is no biliary ductal dilation. Pancreas: Normal contours without ductal dilatation. No peripancreatic fluid collection. Spleen: The spleen is enlarged measuring approximately 15 cm craniocaudad. Adrenals/Urinary Tract: --Adrenal glands: No adrenal hemorrhage. --Right kidney/ureter: No hydronephrosis or perinephric hematoma. --Left kidney/ureter: No hydronephrosis or perinephric hematoma. --Urinary bladder: Bladder is underdistended, however there is diffuse bladder wall thickening. Stomach/Bowel: --Stomach/Duodenum: No hiatal hernia or other gastric abnormality. Normal duodenal course and caliber. --Small bowel: There is a matted appearance of multiple small bowel loops in the lobe midline abdomen. Evaluation is limited by lack of IV and oral contrast. Developing fistula in this location would be difficult to exclude at this time. --Colon: Again noted are findings concerning for perforated sigmoid diverticulitis. There is moderate wall thickening of the sigmoid colon. --Appendix: Not visualized. No right lower quadrant inflammation or free fluid. Vascular/Lymphatic: Atherosclerotic calcification  is present within the non-aneurysmal abdominal aorta, without hemodynamically significant stenosis. --there are multiple enlarged retroperitoneal lymph nodes, favored to be reactive in etiology. --there prominent mesenteric lymph nodes, favored to be reactive in etiology. --prominent pelvic lymph nodes are noted, favored to be reactive. Reproductive: There is a masslike structure in the patient's left hemipelvis measuring approximately 3.8 x 2.8 cm. This is favored to represent the left ovary. Other: There is a well-formed fluid collection in the patient's pelvis measuring approximately 5.8 x 6.3 cm. This is favored to represent developing abscess. Extensive inflammatory changes are again noted in the patient's low midline abdomen/high pelvis. Two percutaneous drains are noted. The previously demonstrated fluid collection in the midline abdomen has substantially improved from prior study. There is a small residual collection along the patient's right hemipelvis measuring approximately 4.9 x 1.7 cm. Evaluation of additional collections is limited by lack of both oral and IV contrast. Musculoskeletal. No acute displaced fractures. IMPRESSION: 1. Evaluation limited by lack of oral and IV contrast. 2. New walled off fluid collection in the patient's pelvis concerning for a developing abscess. Currently, this is amenable to percutaneous drainage as clinically indicated. 3. Status post placement of 2 percutaneous drains with significant interval improvement in the previously demonstrated large abdominal abscesses. Residual  inflammatory changes and small pockets of free fluid are noted in the patient's low midline abdomen/high pelvis. 4. Diffuse wall thickening of the urinary bladder, favored to be reactive. Correlation with urinalysis is recommended to help exclude cystitis. 5. Small right-sided pleural effusion. 6. Hepatomegaly with probable underlying hepatic steatosis. 7. Nonspecific splenomegaly. 8. Enlarged  retroperitoneal and mesenteric lymph nodes, presumably reactive. Aortic Atherosclerosis (ICD10-I70.0). Electronically Signed   By: Constance Holster M.D.   On: 11/07/2019 21:28   CT IMAGE GUIDED DRAINAGE BY PERCUTANEOUS CATHETER  Result Date: 11/09/2019 INDICATION: Diverticulitis, abdominopelvic abscesses. EXAM: CT DRAINAGE PELVIC ABSCESS Date:  11/09/2019 11/09/2019 2:23 pm Radiologist:  M. Daryll Brod, MD Guidance:  CT FLUOROSCOPY TIME:  None. MEDICATIONS: 1% lidocaine local ANESTHESIA/SEDATION: Moderate (conscious) sedation was employed during this procedure. A total of Versed 4.0 mg and Fentanyl 100 mcg was administered intravenously. Moderate Sedation Time: 12 minutes. The patient's level of consciousness and vital signs were monitored continuously by radiology nursing throughout the procedure under my direct supervision. CONTRAST:  None. COMPLICATIONS: None immediate. PROCEDURE: Informed consent was obtained from the patient following explanation of the procedure, risks, benefits and alternatives. The patient understands, agrees and consents for the procedure. All questions were addressed. A time out was performed. Maximal barrier sterile technique utilized including caps, mask, sterile gowns, sterile gloves, large sterile drape, hand hygiene, and ChloraPrep. Previous imaging reviewed. Patient position prone. Noncontrast localization CT performed. The pelvic fluid collection in the cul-de-sac was localized and marked for a left trans gluteal approach. Under sterile conditions and local anesthesia, an 18 gauge 15 cm access needle was advanced percutaneously from a left trans gluteal approach to the fluid collection. Needle position confirmed with CT. Syringe aspiration yielded purulent fluid. Sample sent for culture. Guidewire inserted followed by tract dilatation to insert a 10 [place] drain. Drain catheter position confirmed with CT. Syringe aspiration yielded 30 cc purulent fluid. Catheter secured with  Prolene suture and connected to external suction bulb. Sterile dressing applied. No immediate complication. Patient tolerated the procedure well. IMPRESSION: Successful CT-guided trans gluteal pelvic abscess drain placement as above. Electronically Signed   By: Jerilynn Mages.  Shick M.D.   On: 11/09/2019 14:47    Labs:  CBC: Recent Labs    11/07/19 0550 11/08/19 0509 11/09/19 0459 11/10/19 0448  WBC 15.2* 14.6* 16.0* 14.0*  HGB 8.9* 8.9* 9.7* 9.3*  HCT 30.5* 30.2* 32.8* 31.5*  PLT 333 343 299 301    COAGS: Recent Labs    11/01/19 0526 11/02/19 0503 11/03/19 0618 11/04/19 0746  INR 1.4* 1.3* 1.3* 1.2    BMP: Recent Labs    11/04/19 0746 11/05/19 0525 11/06/19 0816 11/10/19 0448  NA 135  135 136 136 138  K 4.3  4.2 4.1 4.4 4.2  CL 104  104 104 104 103  CO2 24  24 23 26 27   GLUCOSE 130*  131* 135* 129* 118*  BUN 21*  21* 17 14 14   CALCIUM 8.3*  8.2* 8.5* 8.4* 8.7*  CREATININE 1.04*  0.92 0.92 0.90 0.88  GFRNONAA >60  >60 >60 >60 >60  GFRAA >60  >60 >60 >60 >60    LIVER FUNCTION TESTS: Recent Labs    11/02/19 0503 11/03/19 0618 11/04/19 0746 11/05/19 0525  BILITOT 0.7 0.3 0.4 0.3  AST 54* 31 25 19   ALT 43 33 34 25  ALKPHOS 178* 201* 274* 268*  PROT 6.4* 6.1* 6.1* 6.3*  ALBUMIN 2.1* 1.9* 1.9* 2.0*    Assessment and Plan: Patient with history  of diverticulitis and associated abdominal/pelvic abscesses; status post previously placed drains (x3) on 10/31/19; status post removal of left lower quadrant drain on 1/13; status post placement of a new pelvic abscess drain via left transgluteal approach on 1/19; afebrile, creatinine normal, WBC 14 down from 16, hemoglobin 9.3 down slightly from 9.7, new drain fluid cultures pending; continue current treatment/output monitoring/lab checks/drain irrigation; check follow-up CT within 1 week or sooner if clinical status worsens.   Electronically Signed: D. Rowe Robert, PA-C 11/10/2019, 11:00 AM   I spent a total of 15  minutes at the the patient's bedside AND on the patient's hospital floor or unit, greater than 50% of which was counseling/coordinating care for abdominal/pelvic abscess drains    Patient ID: Kristen [name], female   DOB: Feb 18, 1966, 54 y.o.   MRN: [id number]

## 2019-11-10 NOTE — Progress Notes (Signed)
Nutrition Follow-up  DOCUMENTATION CODES:   Morbid obesity  INTERVENTION:   -Ensure MAX Protein po BID, each supplement provides 150 kcal and 30 grams of protein -Prostat liquid protein PO 30 ml BID with meals, each supplement provides 100 kcal, 15 grams protein  NUTRITION DIAGNOSIS:   Increased nutrient needs related to acute illness as evidenced by estimated needs.  Ongoing.  GOAL:   Patient will meet greater than or equal to 90% of their needs  Progressing.  MONITOR:   PO intake, Supplement acceptance, Labs, Weight trends, I & O's  ASSESSMENT:   54 year old Caucasian female with PMH of morbid obesity, type 2 diabetes mellitus, chronic pain on opioids, hypertension, depression, anxiety, asthma, s/p cholecystectomy, CVA with residual vision loss, PCOS, sleep apnea who was brought to the ED by EMS from home for worsening abdominal pain associated with diarrhea. Recently treated for UTI with no improvement in abd pain.   1/10: s/p abdominal drain placement x 3 for perforated sigmoid diverticulitis   **RD working remotely**  Patient consumed 50% of breakfast this morning. Pt has been accepting Ensure Max and Prostat supplements.   Admission weight: 387 lbs. No new weights for this admission.  I/Os: +2.8L since admit UOP: 1.2L x 24 hrs Drains: 205 ml  Medications: Vitamin D tablet, Florastor capsule, IV Mg sulfate, IV Zofran Labs reviewed: CBGs: 112-165  Diet Order:   Diet Order            DIET DYS 3 Room service appropriate? Yes; Fluid consistency: Thin  Diet effective now              EDUCATION NEEDS:   No education needs have been identified at this time  Skin:  Skin Assessment: Reviewed RN Assessment  Last BM:  1/20  Height:   Ht Readings from Last 1 Encounters:  10/31/19 5\' 8"  (1.727 m)    Weight:   Wt Readings from Last 1 Encounters:  10/31/19 (!) 175.8 kg    Ideal Body Weight:  63.6 kg  BMI:  Body mass index is 58.93  kg/m.  Estimated Nutritional Needs:   Kcal:  2200-2400  Protein:  100-120g  Fluid:  2.2L/day   Clayton Bibles, MS, RD, LDN Inpatient Clinical Dietitian Pager: [phone] After Hours Pager: [phone]

## 2019-11-10 NOTE — Progress Notes (Signed)
PROGRESS NOTE    Kristen [name]  OFB:[id number] DOB: [birth year] DOA: 10/30/2019 PCP: Lawerance Cruel, MD  Brief Narrative: 54 year old Caucasian female with PMH of morbid obesity, type 2 diabetes mellitus, chronic pain on opioids, hypertension, depression, anxiety, asthma, s/p cholecystectomy, CVA with residual vision loss, PCOS, sleep apnea who was brought to the ED by EMS from home for worsening abdominal painassociated with diarrhea. Recently treated for UTI with no improvement in abd pain.[employer] home and largely immobile. Reported chronicdepressionand sometimes has suicidal ideations but none now. ED Course: Afebrile,noted to be hypertensive, tachycardic.Found to have leukocytosis of 26K and blood sugar elevated in the 300s. CT abdomen pelvis without contrast (no contrast due to renal dysfunction)showed perforated sigmoid diverticulitis with a large 12 cm abscess in addition to complex fluid collections extending into the patient's pelvis. ED provider spoke to surgeon on call Dr. Gala Lewandowsky who indicated no surgical need at this time and recommended IR consult for drainage of the abscess. Hospital course: [facility] and IR consulted. Patient underwent CT guided drain x3 placement and is on zosyn.tolerating a soft diet.  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  Third drain was placed 11/09/2019 for new abscess. C. difficile negative On  Zosyn Continue Dilaudid for pain control,she still believes she needs a Dilaudid for intense pain. Drains placed by IR Followed by surgery WBCtrending down slowly.  White count down to 14 from 16.  Currently tolerating a soft diet.  Advance diet per general surgery. CT scan1/17/2021 new abscess.   #2 AKI on CKD stage III A-resolved with IV fluids. Last creatinine we have is 0.90.   #3 type 2 diabetes with hyperglycemia-continue Lantus 60 units nightly with SSI. Blood sugar ranging 204, 224, 120, 112.  Continue Lantus and sliding scale insulin.  #4 essential hypertension-blood pressure is fairly stable 140/63.  Continue ACE inhibitor.   #5 history of asthma stable continue inhalers and encourage incentive spirometry. Continue Singulair and Dulera.  #6 morbid obesity needs ongoing counseling regarding weight loss  #7 depression continue home meds Zoloft 200 mg daily.  #8 pyuria with rare bacteriuria initially treated for UTI.   #9 thrush-nystatin ordered   #10 history of PE was on Eliquis as an outpatient which is on hold for possible need for surgical intervention.  Will need to switch her back to Eliquis once okayed by surgery.  Currently on heparin.   Nutrition Problem: Increased nutrient needs Etiology: acute illness     Signs/Symptoms: estimated needs    Interventions: Prostat(Ensure Max)  Estimated body mass index is 58.93 kg/m as calculated from the following:   Height as of this encounter: 5\' 8"  (1.727 m).   Weight as of this encounter: 175.8 kg.  DVT prophylaxis:Heparin Code Status:Full code discussed with patient  Family Communication:Discussed with patient   disposition Plan-patient came from home.  She lives with her sister but according to her the sister worked all day.  She would be discharged to a skilled nursing facility.need clearance from  Surgery for DC.  Will order Covid test for today if all well hopefully she Kristen be  discharged on Friday to a skilled nursing facility.  Discussed with case management.  Consultants:  IR and general surgery   Procedures:3 abdominal drains placed by IR  AntimicrobialsZosyn  Subjective: She is resting in bed still nauseous but pain somewhat better after drain placed yesterday.  Objective: Vitals:   11/09/19 1917 11/09/19 1953 11/10/19 0452 11/10/19 1042  BP:  (!) 152/69 140/63   Pulse:  87 75   Resp:  18 18   Temp:  98.2 F (36.8 C) 98.5 F (36.9 C)   TempSrc:  Oral    SpO2: 94% 95% 95% 94%  Weight:      Height:        Intake/Output Summary (Last 24 hours) at 11/10/2019 1110 Last data filed at 11/10/2019 0700 Gross per 24 hour  Intake 706.94 ml  Output 1850 ml  Net -1143.06 ml   Filed Weights   10/31/19 0200  Weight: (!) 175.8 kg    Examination:  General exam: Appears calm and comfortable  Respiratory system: Clear to auscultation. Respiratory effort normal. Cardiovascular system: S1 & S2 heard, RRR. No JVD, murmurs, rubs, gallops or clicks. No pedal edema. Gastrointestinal system: Abdomen is nondistended, soft and nontender. No organomegaly or masses felt. Normal bowel sounds heard.  3 drains in place with cream-colored drainage. Central nervous system: Alert and oriented. No focal neurological deficits. Extremities: Symmetric 5 x 5 power. Skin: No rashes, lesions or ulcers Psychiatry: Judgement and insight appear normal. Mood & affect appropriate.     Data Reviewed: I have personally reviewed following labs and imaging studies  CBC: Recent Labs  Lab 11/06/19 0816 11/07/19 0550 11/08/19 0509 11/09/19 0459 11/10/19 0448  WBC 19.0* 15.2* 14.6* 16.0* 14.0*  HGB 9.3* 8.9* 8.9* 9.7* 9.3*  HCT 31.1* 30.5* 30.2* 32.8* 31.5*  MCV 87.1 87.9 87.0 88.4 88.0  PLT 322 333 343 299 182   Basic Metabolic Panel: Recent Labs  Lab 11/04/19 0746 11/05/19 0525 11/06/19 0816 11/07/19 0550 11/10/19 0448  NA 135  135 136 136  --  138  K 4.3   4.2 4.1 4.4  --  4.2  CL 104  104 104 104  --  103  CO2 24  24 23 26   --  27  GLUCOSE 130*  131* 135* 129*  --  118*  BUN 21*  21* 17 14  --  14  CREATININE 1.04*  0.92 0.92 0.90  --  0.88  CALCIUM 8.3*  8.2* 8.5* 8.4*  --  8.7*  MG 1.7 1.6* 1.7 1.7 1.8  PHOS 3.2 2.8 3.2 3.2  --    GFR: Estimated Creatinine Clearance: 126.9 mL/min (by C-G formula based on SCr of 0.88 mg/dL). Liver Function Tests: Recent Labs  Lab 11/04/19 0746 11/05/19 0525  AST 25 19  ALT 34 25  ALKPHOS 274* 268*  BILITOT 0.4 0.3  PROT 6.1* 6.3*  ALBUMIN 1.9* 2.0*   No results for input(s): LIPASE, AMYLASE in the last 168 hours. No results for input(s): AMMONIA in the last 168 hours. Coagulation Profile: Recent Labs  Lab 11/04/19 0746  INR 1.2   Cardiac Enzymes: No results for input(s): CKTOTAL, CKMB, CKMBINDEX, TROPONINI in the last 168 hours. BNP (last 3 results) No results for input(s): PROBNP in the last 8760 hours. HbA1C: No results for input(s): HGBA1C in the  last 72 hours. CBG: Recent Labs  Lab 11/09/19 1626 11/09/19 2047 11/10/19 0030 11/10/19 0422 11/10/19 0734  GLUCAP 107* 204* 224* 120* 112*   Lipid Profile: No results for input(s): CHOL, HDL, LDLCALC, TRIG, CHOLHDL, LDLDIRECT in the last 72 hours. Thyroid Function Tests: No results for input(s): TSH, T4TOTAL, FREET4, T3FREE, THYROIDAB in the last 72 hours. Anemia Panel: No results for input(s): VITAMINB12, FOLATE, FERRITIN, TIBC, IRON, RETICCTPCT in the last 72 hours. Sepsis Labs: No results for input(s): PROCALCITON, LATICACIDVEN in the last 168 hours.  Recent Results (from the past 240 hour(s))  Aerobic/Anaerobic Culture (surgical/deep wound)     Status: None   Collection Time: 10/31/19  4:11 PM   Specimen: Abscess  Result Value Ref Range Status   Specimen Description   Final    ABSCESS Performed at [facility] [address] [place], [place] [address]    Special Requests   Final    1 LEFT  ABDOMINAL LATERAL Performed at [facility], [place] [address] [place], [place] [address]    Gram Stain   Final    ABUNDANT WBC PRESENT, PREDOMINANTLY PMN ABUNDANT GRAM POSITIVE COCCI IN PAIRS IN CLUSTERS IN CHAINS FEW GRAM NEGATIVE COCCOBACILLI RARE GRAM POSITIVE RODS Performed at [facility], [place] [address], [place], [place] [address]    Culture   Final    MODERATE ESCHERICHIA COLI MIXED ANAEROBIC FLORA PRESENT.  CALL LAB IF FURTHER IID REQUIRED.    Report Status 11/04/2019 FINAL  Final   Organism ID, Bacteria ESCHERICHIA COLI  Final      Susceptibility   Escherichia coli - MIC*    AMPICILLIN >=32 RESISTANT Resistant     CEFAZOLIN <=4 SENSITIVE Sensitive     CEFEPIME <=0.12 SENSITIVE Sensitive     CEFTAZIDIME <=1 SENSITIVE Sensitive     CEFTRIAXONE <=0.25 SENSITIVE Sensitive     CIPROFLOXACIN >=4 RESISTANT Resistant     GENTAMICIN >=16 RESISTANT Resistant     IMIPENEM <=0.25 SENSITIVE Sensitive     TRIMETH/SULFA >=320 RESISTANT Resistant     AMPICILLIN/SULBACTAM >=32 RESISTANT Resistant     PIP/TAZO <=4 SENSITIVE Sensitive     * MODERATE ESCHERICHIA COLI  Aerobic/Anaerobic Culture (surgical/deep wound)     Status: None   Collection Time: 10/31/19  4:19 PM   Specimen: Abscess  Result Value Ref Range Status   Specimen Description   Final    ABSCESS Performed at [facility] [address], [place], [place] [address]    Special Requests   Final    2 LEFT ABDOMEN MEDIAL Performed at [facility], [place] [address], [place], [place] [address]    Gram Stain   Final    FEW WBC PRESENT,BOTH PMN AND MONONUCLEAR ABUNDANT GRAM POSITIVE COCCI IN PAIRS IN CHAINS RARE GRAM POSITIVE RODS RARE GRAM NEGATIVE RODS    Culture   Final    ABUNDANT ACTINOMYCES NEUII ABUNDANT PREVOTELLA SPECIES BETA LACTAMASE POSITIVE Performed at [facility], [place] [address], [place], [place] [address]    Report Status 11/08/2019  FINAL  Final  Aerobic/Anaerobic Culture (surgical/deep wound)     Status: None (Preliminary result)   Collection Time: 11/09/19  1:55 PM   Specimen: Abscess  Result Value Ref Range Status   Specimen Description   Final    ABSCESS PELVIC Performed at [facility] [address], [place], [place] [address]    Special Requests   Final  Normal Performed at [name], [place] [address] [place], [place] [address]    Gram Stain   Final    ABUNDANT WBC PRESENT, PREDOMINANTLY PMN MODERATE GRAM POSITIVE COCCI    Culture   Final    CULTURE REINCUBATED FOR BETTER GROWTH Performed at [facility], [place] [address] [place],  [address]    Report Status PENDING  Incomplete         Radiology Studies: CT IMAGE GUIDED DRAINAGE BY PERCUTANEOUS CATHETER  Result Date: 11/09/2019 INDICATION: Diverticulitis, abdominopelvic abscesses. EXAM: CT DRAINAGE PELVIC ABSCESS Date:  11/09/2019 11/09/2019 2:23 pm Radiologist:  M. Daryll Brod, MD Guidance:  CT FLUOROSCOPY TIME:  None. MEDICATIONS: 1% lidocaine local ANESTHESIA/SEDATION: Moderate (conscious) sedation was employed during this procedure. A total of Versed 4.0 mg and Fentanyl 100 mcg was administered intravenously. Moderate Sedation Time: 12 minutes. The patient's level of consciousness and vital signs were monitored continuously by radiology nursing throughout the procedure under my direct supervision. CONTRAST:  None. COMPLICATIONS: None immediate. PROCEDURE: Informed consent was obtained from the patient following explanation of the procedure, risks, benefits and alternatives. The patient understands, agrees and consents for the procedure. All questions were addressed. A time out was performed. Maximal barrier sterile technique utilized including caps, mask, sterile gowns, sterile gloves, large sterile drape, hand hygiene, and ChloraPrep. Previous imaging reviewed. Patient position prone. Noncontrast  localization CT performed. The pelvic fluid collection in the cul-de-sac was localized and marked for a left trans gluteal approach. Under sterile conditions and local anesthesia, an 18 gauge 15 cm access needle was advanced percutaneously from a left trans gluteal approach to the fluid collection. Needle position confirmed with CT. Syringe aspiration yielded purulent fluid. Sample sent for culture. Guidewire inserted followed by tract dilatation to insert a 10 [place] drain. Drain catheter position confirmed with CT. Syringe aspiration yielded 30 cc purulent fluid. Catheter secured with Prolene suture and connected to external suction bulb. Sterile dressing applied. No immediate complication. Patient tolerated the procedure well. IMPRESSION: Successful CT-guided trans gluteal pelvic abscess drain placement as above. Electronically Signed   By: Jerilynn Mages.  Shick M.D.   On: 11/09/2019 14:47        Scheduled Meds: . acetaminophen  1,000 mg Oral Q6H  . atorvastatin  40 mg Oral Daily  . cholecalciferol  2,000 Units Oral Daily  . feeding supplement (PRO-STAT SUGAR FREE 64)  30 mL Oral BID  . insulin aspart  0-20 Units Subcutaneous Q4H  . insulin aspart  10 Units Subcutaneous TID WC  . insulin glargine  60 Units Subcutaneous QHS  . lip balm  1 application Topical BID  . methocarbamol  1,000 mg Oral TID  . mometasone-formoterol  2 puff Inhalation BID  . montelukast  10 mg Oral QHS  . nystatin  5 mL Oral QID  . polyethylene glycol  17 g Oral Daily  . Ensure Max Protein  11 oz Oral BID  . saccharomyces boulardii  250 mg Oral BID  . sertraline  200 mg Oral Daily  . sodium chloride flush  3 mL Intravenous Q12H  . sodium chloride flush  3 mL Intravenous Q12H  . sodium chloride flush  5 mL Intracatheter Q8H  . sodium chloride flush  5 mL Intracatheter Q8H  . sodium chloride flush  5 mL Intracatheter Q8H   Continuous Infusions: . sodium chloride    . heparin 3,200 Units/hr (11/10/19 1040)  . magnesium  sulfate bolus IVPB 2 g (11/10/19 1041)  .  piperacillin-tazobactam (ZOSYN)  IV 3.375 g (11/10/19 0620)     LOS: 11 days     Georgette Shell, MD [facility]  If 7PM-7AM, please contact night-coverage [email or web address] Password [email or web address] 11/10/2019, 11:10 AM

## 2019-11-10 NOTE — Progress Notes (Signed)
Patient ID: Kristen [name], female   DOB: 1966/10/14, 54 y.o.   MRN: [id number]       Subjective: Still with pain, but states it is a little better after this last drain.  Ate full dysphagia diet for breakfast and tolerated it ok until she began to move around in bed and then became somewhat nauseated.  ROS: See above, otherwise other systems negative  Objective: Vital signs in last 24 hours: Temp:  [97.6 F (36.4 C)-98.5 F (36.9 C)] 98.5 F (36.9 C) (01/20 0452) Pulse Rate:  [75-87] 75 (01/20 0452) Resp:  [16-22] 18 (01/20 0452) BP: (140-169)/(61-100) 140/63 (01/20 0452) SpO2:  [93 %-99 %] 95 % (01/20 0452) Last BM Date: 11/07/19  Intake/Output from previous day: 01/19 0701 - 01/20 0700 In: 706.9 [P.O.:240; I.V.:335.1; IV Piggyback:131.8] Out: 2550 [Urine:2400; Drains:150] Intake/Output this shift: No intake/output data recorded.  PE: Abd: soft, morbidly obese, all three drains with thin cloudy purulent output, still with some diffuse tenderness, but no peritonitis.  Lab Results:  Recent Labs    11/09/19 0459 11/10/19 0448  WBC 16.0* 14.0*  HGB 9.7* 9.3*  HCT 32.8* 31.5*  PLT 299 301   BMET Recent Labs    11/10/19 0448  NA 138  K 4.2  CL 103  CO2 27  GLUCOSE 118*  BUN 14  CREATININE 0.88  CALCIUM 8.7*   PT/INR No results for input(s): LABPROT, INR in the last 72 hours. CMP     Component Value Date/Time   NA 138 11/10/2019 0448   NA 138 04/14/2017 1348   K 4.2 11/10/2019 0448   K 5.1 04/14/2017 1348   CL 103 11/10/2019 0448   CO2 27 11/10/2019 0448   CO2 27 04/14/2017 1348   GLUCOSE 118 (H) 11/10/2019 0448   GLUCOSE 257 (H) 04/14/2017 1348   BUN 14 11/10/2019 0448   BUN 16.3 04/14/2017 1348   CREATININE 0.88 11/10/2019 0448   CREATININE 1.14 (H) 04/26/2019 1205   CREATININE 1.2 (H) 04/14/2017 1348   CALCIUM 8.7 (L) 11/10/2019 0448   CALCIUM 9.8 04/14/2017 1348   PROT 6.3 (L) 11/05/2019 0525   PROT 7.1 04/14/2017 1348   ALBUMIN 2.0 (L)  11/05/2019 0525   ALBUMIN 3.4 (L) 04/14/2017 1348   AST 19 11/05/2019 0525   AST 24 04/14/2017 1348   ALT 25 11/05/2019 0525   ALT 24 04/14/2017 1348   ALKPHOS 268 (H) 11/05/2019 0525   ALKPHOS 75 04/14/2017 1348   BILITOT 0.3 11/05/2019 0525   BILITOT 0.40 04/14/2017 1348   GFRNONAA >60 11/10/2019 0448   GFRNONAA 55 (L) 04/26/2019 1205   GFRAA >60 11/10/2019 0448   GFRAA >60 04/26/2019 1205   Lipase     Component Value Date/Time   LIPASE 23 10/30/2019 1748       Studies/Results: CT IMAGE GUIDED DRAINAGE BY PERCUTANEOUS CATHETER  Result Date: 11/09/2019 INDICATION: Diverticulitis, abdominopelvic abscesses. EXAM: CT DRAINAGE PELVIC ABSCESS Date:  11/09/2019 11/09/2019 2:23 pm Radiologist:  M. Daryll Brod, MD Guidance:  CT FLUOROSCOPY TIME:  None. MEDICATIONS: 1% lidocaine local ANESTHESIA/SEDATION: Moderate (conscious) sedation was employed during this procedure. A total of Versed 4.0 mg and Fentanyl 100 mcg was administered intravenously. Moderate Sedation Time: 12 minutes. The patient's level of consciousness and vital signs were monitored continuously by radiology nursing throughout the procedure under my direct supervision. CONTRAST:  None. COMPLICATIONS: None immediate. PROCEDURE: Informed consent was obtained from the patient following explanation of the procedure, risks, benefits and alternatives. The patient understands,  agrees and consents for the procedure. All questions were addressed. A time out was performed. Maximal barrier sterile technique utilized including caps, mask, sterile gowns, sterile gloves, large sterile drape, hand hygiene, and ChloraPrep. Previous imaging reviewed. Patient position prone. Noncontrast localization CT performed. The pelvic fluid collection in the cul-de-sac was localized and marked for a left trans gluteal approach. Under sterile conditions and local anesthesia, an 18 gauge 15 cm access needle was advanced percutaneously from a left trans gluteal  approach to the fluid collection. Needle position confirmed with CT. Syringe aspiration yielded purulent fluid. Sample sent for culture. Guidewire inserted followed by tract dilatation to insert a 10 [place] drain. Drain catheter position confirmed with CT. Syringe aspiration yielded 30 cc purulent fluid. Catheter secured with Prolene suture and connected to external suction bulb. Sterile dressing applied. No immediate complication. Patient tolerated the procedure well. IMPRESSION: Successful CT-guided trans gluteal pelvic abscess drain placement as above. Electronically Signed   By: Jerilynn Mages.  Shick M.D.   On: 11/09/2019 14:47    Anti-infectives: Anti-infectives (From admission, onward)   Start     Dose/Rate Route Frequency Ordered Stop   10/30/19 2130  piperacillin-tazobactam (ZOSYN) IVPB 3.375 g     3.375 g 12.5 mL/hr over 240 Minutes Intravenous Every 8 hours 10/30/19 2100     10/30/19 2030  piperacillin-tazobactam (ZOSYN) IVPB 2.25 g  Status:  Discontinued     2.25 g 100 mL/hr over 30 Minutes Intravenous Every 6 hours 10/30/19 2024 10/30/19 2100   10/30/19 1930  cefTRIAXone (ROCEPHIN) 2 g in sodium chloride 0.9 % 100 mL IVPB  Status:  Discontinued     2 g 200 mL/hr over 30 Minutes Intravenous  Once 10/30/19 1916 10/30/19 2024   10/30/19 1930  metroNIDAZOLE (FLAGYL) IVPB 500 mg  Status:  Discontinued     500 mg 100 mL/hr over 60 Minutes Intravenous  Once 10/30/19 1916 10/30/19 2024       Assessment/Plan Type 2 diabetes Hypertension Hx pulmonary embolism-chronic anticoagulation- Eliquis, now on hep gtt Hx of CVA with vision loss Chronic back pain - oxycodone/[facility] Asthma Sleep apnea Severe obesityBMI 58.93 Acute on CKD  Acute diverticulitis with large abscess IR drain placement x3 on 10/31/19, Dr. Markus Daft - Drains #1 & #3 remain, now along with new drain placed on 1/19 which is TG -WBC trending down to 14K today -cont diet for now along with abx  therapy and drains   FEN:D3 diet ID: Zosyn 1/9>>continue for now.  DVT: heparin gtt per medicine Follow-up:TBD.    Plan: "Hopefully patient can transition to oral antibiotics and follow-up at drain clinic and surgery in the coming week if she continues to improve. At some point the patient would benefit from colonoscopy and segmental colonic resection of area of diverticulitis to break the cycle of attacks. Last colonoscopy in 2017 was a poor quality. Ideally would want to this current attack and infection / abscess to be resolved before proceeding with colonoscopy and then colectomy 6-8 weeks from now. Would need medical clearance given her history of pulmonary embolism. Cardiac clearance by Dr. Harrington Challenger" Per Dr. Johney Maine   LOS: 11 days    Henreitta Cea , [name] Surgery 11/10/2019, 10:16 AM Please see Amion for pager number during day hours 7:00am-4:30pm or 7:00am -11:30am on weekends

## 2019-11-10 NOTE — Progress Notes (Signed)
ANTICOAGULATION CONSULT NOTE - follow up  Pharmacy Consult for Heparin Indication: H/O PE (on Eliquis PTA) - bridge therapy until oral anticoagulation can resume  Allergies  Allergen Reactions  . Cafergot Other (See Comments)    Chest pain; ergotamine-caffiene  . Glucophage [Metformin Hcl] Anaphylaxis  . Canagliflozin Swelling and Other (See Comments)    Legs Swell invokana Legs Swell    Patient Measurements: Height: 5\' 8"  (172.7 cm) Weight: (!) 387 lb 9.1 oz (175.8 kg) IBW/kg (Calculated) : 63.9 Heparin Dosing Weight: 108 kg  Vital Signs: Temp: 98.5 F (36.9 C) (01/20 0452) BP: 140/63 (01/20 0452) Pulse Rate: 75 (01/20 0452)  Labs: Recent Labs    11/08/19 0509 11/08/19 0509 11/09/19 0459 11/10/19 0448  HGB 8.9*   < > 9.7* 9.3*  HCT 30.2*  --  32.8* 31.5*  PLT 343  --  299 301  HEPARINUNFRC 0.30  --  0.59 0.53  CREATININE  --   --   --  0.88   < > = values in this interval not displayed.    Estimated Creatinine Clearance: 126.9 mL/min (by C-G formula based on SCr of 0.88 mg/dL).   Assessment: 16yr female admitted on 10/30/19 with performated sigmoid diverticulitis with abscess.  Patient on Eliquis PTA for h/o PE.  Eliquis held on admission pending potential procedures, anticoagulation transitioned to IV heparin.  Significant Events: -1/10: 3 Abscess drains placed (2 remain as of 1/19) -1/19: CT drain placed by IR for pelvic abscess  Today, 11/10/19  HL = 0.53 remains therapeutic on heparin infusion of 3200 units/hr  Confirmed with RN that heparin infusing at correct rate. No signs/symptoms of bleeding.  CBC: Hgb low but stable, Plt WNL & stable  Goal of Therapy:  Heparin level 0.3-0.7 units/ml Monitor platelets by anticoagulation protocol: Yes   Plan:   Continue heparin at current rate of 3200 units/hr  Monitor daily heparin level & CBC  Follow along for when safe to transition back to PTA apixaban when cleared by surgery and IR.   Lenis Noon,  PharmD 11/10/19 8:23 AM

## 2019-11-10 NOTE — TOC Progression Note (Signed)
Transition of Care [facility]) - Progression Note    Patient Details  Name: ANNTIONETTE MADKINS MRN: [id number] Date of Birth: 1965/11/08  [facility] of [facility] CM/SW [place], [place] Phone Number: 11/10/2019, 2:46 PM  Clinical Narrative:   Resubmitted request for authorization from insurance.  Also submitted transportation auth. Request in anticipation of Friday d/c.  [facility] has offered bed. TOC will continue to follow during the course of hospitalization.     Expected Discharge Plan: [place] Barriers to Discharge: No Barriers Identified  Expected Discharge Plan and Services Expected Discharge Plan: [place]   Discharge Planning Services: CM Consult Post Acute Care Choice: [place] Living arrangements for the past 2 months: Single Family Home                                       Social Determinants of Health (SDOH) Interventions    Readmission Risk Interventions No flowsheet data found.

## 2019-11-10 NOTE — Progress Notes (Signed)
Occupational Therapy Treatment Patient Details Name: Kristen [name] MRN: [id number] DOB: 1966-01-22 Today's Date: 11/10/2019    History of present illness 54 yo female admitted to ED on 1/9 with abdominal pain, found to have perforated sigmoid diverticulitis with large abscess. Pt s/p CT guided drainage of abdomino-pelvic fluid, placement of 3 JP drains. PMH includes anxiety, asthma, HTN, DM, PCOS, L optic nerve, CVA, CKD III.  Now with 2 JP drains   OT comments  Blue theraband issued  Follow Up Recommendations  SNF    Equipment Recommendations  Other (comment)(defer to next venue)    Recommendations for Other Services      Precautions / Restrictions Precautions Precautions: Fall              ADL either performed or assessed with clinical judgement   ADL Overall ADL's : Needs assistance/impaired                                       General ADL Comments: pt had just gotten back to bed after walking in the hall.  Pt agreed to BUE exercise.  OT provided blue theraband for pt and she did GREAT!  Education on shoulder flexion, abduction exercise as well as elbow flexion/extension.  Pt pleased and motivated to perform BUE exercise.     Vision Patient Visual Report: No change from baseline            Cognition Arousal/Alertness: Awake/alert Behavior During Therapy: WFL for tasks assessed/performed Overall Cognitive Status: Within Functional Limits for tasks assessed                                          Exercises General Exercises - Upper Extremity Shoulder Flexion: AROM;Theraband;20 reps;Both Theraband Level (Shoulder Flexion): Level 4 (Blue) Shoulder ABduction: AROM;20 reps;Supine;Theraband Shoulder ADduction: AROM;20 reps;Both;Theraband;Supine Theraband Level (Shoulder Adduction): Level 4 (Blue) Elbow Extension: AROM;Both;20 reps;Theraband Theraband Level (Elbow Extension): Level 4 (Blue)   Shoulder Instructions           Pertinent Vitals/ Pain       Pain Assessment: No/denies pain         Frequency  Min 2X/week        Progress Toward Goals  OT Goals(current goals can now be found in the care plan section)  Progress towards OT goals: Progressing toward goals     Plan Discharge plan remains appropriate       AM-PAC OT "6 Clicks" Daily Activity     Outcome Measure   Help from another person eating meals?: None Help from another person taking care of personal grooming?: A Little Help from another person toileting, which includes using toliet, bedpan, or urinal?: A Lot Help from another person bathing (including washing, rinsing, drying)?: A Lot Help from another person to put on and taking off regular upper body clothing?: A Little Help from another person to put on and taking off regular lower body clothing?: A Lot 6 Click Score: 16    End of Session Equipment Utilized During Treatment: Other (comment)(blue theraband)  OT Visit Diagnosis: Other abnormalities of gait and mobility (R26.89);Muscle weakness (generalized) (M62.81);Pain;Unsteadiness on feet (R26.81)   Activity Tolerance Patient tolerated treatment well   Patient Left in bed;with call bell/phone within reach   Nurse Communication Other (comment)(NT present throughout session)  Time: 2706-2376 OT Time Calculation (min): 22 min  Charges: OT General Charges $OT Visit: 1 Visit OT Treatments $Therapeutic Exercise: 8-22 mins  Kari Baars, [place] [phone] Office- [phone], Edwena Felty D 11/10/2019, 4:50 PM

## 2019-11-11 LAB — CBC
HCT: 30.4 % — ABNORMAL LOW (ref 36.0–46.0)
Hemoglobin: 8.8 g/dL — ABNORMAL LOW (ref 12.0–15.0)
MCH: 25.6 pg — ABNORMAL LOW (ref 26.0–34.0)
MCHC: 28.9 g/dL — ABNORMAL LOW (ref 30.0–36.0)
MCV: 88.4 fL (ref 80.0–100.0)
Platelets: 305 10*3/uL (ref 150–400)
RBC: 3.44 MIL/uL — ABNORMAL LOW (ref 3.87–5.11)
RDW: 15.4 % (ref 11.5–15.5)
WBC: 10.7 10*3/uL — ABNORMAL HIGH (ref 4.0–10.5)
nRBC: 0 % (ref 0.0–0.2)

## 2019-11-11 LAB — GLUCOSE, CAPILLARY
Glucose-Capillary: 138 mg/dL — ABNORMAL HIGH (ref 70–99)
Glucose-Capillary: 146 mg/dL — ABNORMAL HIGH (ref 70–99)
Glucose-Capillary: 147 mg/dL — ABNORMAL HIGH (ref 70–99)
Glucose-Capillary: 150 mg/dL — ABNORMAL HIGH (ref 70–99)
Glucose-Capillary: 158 mg/dL — ABNORMAL HIGH (ref 70–99)
Glucose-Capillary: 96 mg/dL (ref 70–99)

## 2019-11-11 LAB — SARS CORONAVIRUS 2 (TAT 6-24 HRS): SARS Coronavirus 2: NEGATIVE

## 2019-11-11 LAB — HEPARIN LEVEL (UNFRACTIONATED): Heparin Unfractionated: 0.67 IU/mL (ref 0.30–0.70)

## 2019-11-11 MED ORDER — POLYETHYLENE GLYCOL 3350 17 G PO PACK
17.0000 g | PACK | Freq: Two times a day (BID) | ORAL | Status: DC
  Administered 2019-11-11 – 2019-11-12 (×3): 17 g via ORAL
  Filled 2019-11-11 (×4): qty 1

## 2019-11-11 MED ORDER — APIXABAN 2.5 MG PO TABS
2.5000 mg | ORAL_TABLET | Freq: Two times a day (BID) | ORAL | Status: DC
  Administered 2019-11-11 – 2019-11-12 (×3): 2.5 mg via ORAL
  Filled 2019-11-11 (×3): qty 1

## 2019-11-11 NOTE — Progress Notes (Signed)
Patient ID: Kristen [name], female   DOB: 1965/11/28, 54 y.o.   MRN: [id number]       Subjective: Feeling better. Still some lower abdominal pain intermittently.   ROS: See above, otherwise other systems negative  Objective: Vital signs in last 24 hours: Temp:  [97.6 F (36.4 C)-98.5 F (36.9 C)] 98.2 F (36.8 C) (01/21 0434) Pulse Rate:  [77-80] 77 (01/21 0434) Resp:  [18-19] 19 (01/21 0434) BP: (143-153)/(60-79) 143/69 (01/21 0434) SpO2:  [94 %-98 %] 98 % (01/21 0434) Last BM Date: 11/07/19  Intake/Output from previous day: 01/20 0701 - 01/21 0700 In: 1104.6 [P.O.:840; I.V.:26; IV Piggyback:223.6] Out: 1900 [Urine:1700; Drains:200] Intake/Output this shift: No intake/output data recorded.  PE: Abd: soft, morbidly obese, all three drains with thin cloudy purulent output- looks to be clearing up slightly, still with mild diffuse tenderness.  Lab Results:  Recent Labs    11/10/19 0448 11/11/19 0555  WBC 14.0* 10.7*  HGB 9.3* 8.8*  HCT 31.5* 30.4*  PLT 301 305   BMET Recent Labs    11/10/19 0448  NA 138  K 4.2  CL 103  CO2 27  GLUCOSE 118*  BUN 14  CREATININE 0.88  CALCIUM 8.7*   PT/INR No results for input(s): LABPROT, INR in the last 72 hours. CMP     Component Value Date/Time   NA 138 11/10/2019 0448   NA 138 04/14/2017 1348   K 4.2 11/10/2019 0448   K 5.1 04/14/2017 1348   CL 103 11/10/2019 0448   CO2 27 11/10/2019 0448   CO2 27 04/14/2017 1348   GLUCOSE 118 (H) 11/10/2019 0448   GLUCOSE 257 (H) 04/14/2017 1348   BUN 14 11/10/2019 0448   BUN 16.3 04/14/2017 1348   CREATININE 0.88 11/10/2019 0448   CREATININE 1.14 (H) 04/26/2019 1205   CREATININE 1.2 (H) 04/14/2017 1348   CALCIUM 8.7 (L) 11/10/2019 0448   CALCIUM 9.8 04/14/2017 1348   PROT 6.3 (L) 11/05/2019 0525   PROT 7.1 04/14/2017 1348   ALBUMIN 2.0 (L) 11/05/2019 0525   ALBUMIN 3.4 (L) 04/14/2017 1348   AST 19 11/05/2019 0525   AST 24 04/14/2017 1348   ALT 25 11/05/2019 0525   ALT 24 04/14/2017 1348   ALKPHOS 268 (H) 11/05/2019 0525   ALKPHOS 75 04/14/2017 1348   BILITOT 0.3 11/05/2019 0525   BILITOT 0.40 04/14/2017 1348   GFRNONAA >60 11/10/2019 0448   GFRNONAA 55 (L) 04/26/2019 1205   GFRAA >60 11/10/2019 0448   GFRAA >60 04/26/2019 1205   Lipase     Component Value Date/Time   LIPASE 23 10/30/2019 1748       Studies/Results: CT IMAGE GUIDED DRAINAGE BY PERCUTANEOUS CATHETER  Result Date: 11/09/2019 INDICATION: Diverticulitis, abdominopelvic abscesses. EXAM: CT DRAINAGE PELVIC ABSCESS Date:  11/09/2019 11/09/2019 2:23 pm Radiologist:  M. Daryll Brod, MD Guidance:  CT FLUOROSCOPY TIME:  None. MEDICATIONS: 1% lidocaine local ANESTHESIA/SEDATION: Moderate (conscious) sedation was employed during this procedure. A total of Versed 4.0 mg and Fentanyl 100 mcg was administered intravenously. Moderate Sedation Time: 12 minutes. The patient's level of consciousness and vital signs were monitored continuously by radiology nursing throughout the procedure under my direct supervision. CONTRAST:  None. COMPLICATIONS: None immediate. PROCEDURE: Informed consent was obtained from the patient following explanation of the procedure, risks, benefits and alternatives. The patient understands, agrees and consents for the procedure. All questions were addressed. A time out was performed. Maximal barrier sterile technique utilized including caps, mask, sterile gowns, sterile gloves,  large sterile drape, hand hygiene, and ChloraPrep. Previous imaging reviewed. Patient position prone. Noncontrast localization CT performed. The pelvic fluid collection in the cul-de-sac was localized and marked for a left trans gluteal approach. Under sterile conditions and local anesthesia, an 18 gauge 15 cm access needle was advanced percutaneously from a left trans gluteal approach to the fluid collection. Needle position confirmed with CT. Syringe aspiration yielded purulent fluid. Sample sent for  culture. Guidewire inserted followed by tract dilatation to insert a 10 [place] drain. Drain catheter position confirmed with CT. Syringe aspiration yielded 30 cc purulent fluid. Catheter secured with Prolene suture and connected to external suction bulb. Sterile dressing applied. No immediate complication. Patient tolerated the procedure well. IMPRESSION: Successful CT-guided trans gluteal pelvic abscess drain placement as above. Electronically Signed   By: Jerilynn Mages.  Shick M.D.   On: 11/09/2019 14:47    Anti-infectives: Anti-infectives (From admission, onward)   Start     Dose/Rate Route Frequency Ordered Stop   10/30/19 2130  piperacillin-tazobactam (ZOSYN) IVPB 3.375 g     3.375 g 12.5 mL/hr over 240 Minutes Intravenous Every 8 hours 10/30/19 2100     10/30/19 2030  piperacillin-tazobactam (ZOSYN) IVPB 2.25 g  Status:  Discontinued     2.25 g 100 mL/hr over 30 Minutes Intravenous Every 6 hours 10/30/19 2024 10/30/19 2100   10/30/19 1930  cefTRIAXone (ROCEPHIN) 2 g in sodium chloride 0.9 % 100 mL IVPB  Status:  Discontinued     2 g 200 mL/hr over 30 Minutes Intravenous  Once 10/30/19 1916 10/30/19 2024   10/30/19 1930  metroNIDAZOLE (FLAGYL) IVPB 500 mg  Status:  Discontinued     500 mg 100 mL/hr over 60 Minutes Intravenous  Once 10/30/19 1916 10/30/19 2024       Assessment/Plan Type 2 diabetes Hypertension Hx pulmonary embolism-chronic anticoagulation- Eliquis, now on hep gtt Hx of CVA with vision loss Chronic back pain - oxycodone/[facility] Asthma Sleep apnea Severe obesityBMI 58.93 Acute on CKD- resolved  Acute diverticulitis with large abscess IR drain placement x3 on 10/31/19, Dr. Markus Daft - Drains #1 & #3 remain, now along with new drain placed on 1/19 which is TG -WBC trending down to 10.7 today, afebrile -cont diet for now along with abx therapy and drains   FEN:D3 diet ID: Zosyn 1/9>>continue for now.  DVT: heparin gtt per  medicine Follow-up:Colorectal surgery.    Plan: Continue supportive care, bowel regimen, consider transition to PO abx. She is nearing readiness for discharge to SNF if progress continues. At some point the patient would benefit from colonoscopy and segmental colonic resection of area of diverticulitis to break the cycle of attacks. Last colonoscopy in 2017 was a poor quality. Ideally this current attack and infection / abscess would be resolved before proceeding with colonoscopy and then colectomy 6-8 weeks from now. Would need medical clearance given her history of pulmonary embolism. Cardiac clearance by Dr. Harrington Challenger   LOS: 11 days    Clovis Riley , [facility] 11/11/2019, 7:52 AM Please see Amion for pager number during day hours 7:00am-4:30pm or 7:00am -11:30am on weekends

## 2019-11-11 NOTE — Progress Notes (Signed)
Physical Therapy Treatment Patient Details Name: Kristen [name] MRN: [id number] DOB: 1966/04/14 Today's Date: 11/11/2019    History of Present Illness 54 yo female admitted to ED on 1/9 with abdominal pain, found to have perforated sigmoid diverticulitis with large abscess. Pt s/p CT guided drainage of abdomino-pelvic fluid, placement of 3 JP drains. PMH includes anxiety, asthma, HTN, DM, PCOS, L optic nerve, CVA, CKD III.   Pt admitted for perforated sigmoid diverticulitis with complicated large abscess 12 cm-she now has 3 drains in place.  Third drain was placed 11/09/2019 for new abscess    PT Comments    Pt assisted with ambulating short distance in hallway and presents with generalized weakness and limited endurance.  Pt would benefit from SNF upon d/c prior to return home.   Follow Up Recommendations  SNF     Equipment Recommendations  Rolling walker with 5" wheels    Recommendations for Other Services       Precautions / Restrictions Precautions Precautions: Fall Precaution Comments: 3 JP drains    Mobility  Bed Mobility Overal bed mobility: Needs Assistance Bed Mobility: Supine to Sit     Supine to sit: Mod assist;HOB elevated     General bed mobility comments: assist for trunk upright  Transfers Overall transfer level: Needs assistance Equipment used: Rolling walker (2 wheeled) Transfers: Sit to/from Stand Sit to Stand: Min assist         General transfer comment: assist to rise and steady, good hand placement  Ambulation/Gait Ambulation/Gait assistance: Min assist Gait Distance (Feet): 80 Feet Assistive device: Rolling walker (2 wheeled) Gait Pattern/deviations: Step-through pattern;Decreased stride length;Trunk flexed;Wide base of support Gait velocity: decr   General Gait Details: assist to steady with one instance of LOB/fatigue; pt with 2-3 standing rest breaks due to fatigue   Stairs             Wheelchair Mobility    Modified  Rankin (Stroke Patients Only)       Balance                                            Cognition Arousal/Alertness: Awake/alert Behavior During Therapy: WFL for tasks assessed/performed Overall Cognitive Status: Within Functional Limits for tasks assessed                                        Exercises      General Comments        Pertinent Vitals/Pain Pain Assessment: Faces Faces Pain Scale: Hurts little more Pain Location: abdomen, sitting EOB and during mobility Pain Descriptors / Indicators: Sore;Discomfort;Guarding Pain Intervention(s): Monitored during session;Repositioned    Home Living                      Prior Function            PT Goals (current goals can now be found in the care plan section) Progress towards PT goals: Progressing toward goals    Frequency    Min 3X/week      PT Plan Current plan remains appropriate    Co-evaluation              AM-PAC PT "6 Clicks" Mobility   Outcome Measure  Help needed turning from your back to  your side while in a flat bed without using bedrails?: A Lot Help needed moving from lying on your back to sitting on the side of a flat bed without using bedrails?: A Lot Help needed moving to and from a bed to a chair (including a wheelchair)?: A Little Help needed standing up from a chair using your arms (e.g., wheelchair or bedside chair)?: A Little Help needed to walk in hospital room?: A Little Help needed climbing 3-5 steps with a railing? : A Lot 6 Click Score: 15    End of Session   Activity Tolerance: Patient tolerated treatment well Patient left: in chair;with call bell/phone within reach   PT Visit Diagnosis: Other abnormalities of gait and mobility (R26.89);Difficulty in walking, not elsewhere classified (R26.2);Muscle weakness (generalized) (M62.81)     Time: 1610-9604 PT Time Calculation (min) (ACUTE ONLY): 18 min  Charges:  $Gait Training:  8-22 mins                     Arlyce Dice, DPT Acute Rehabilitation Services Office: [phone]   Sonny Anthes,KATHrine E 11/11/2019, 11:02 AM

## 2019-11-11 NOTE — Progress Notes (Addendum)
PROGRESS NOTE    Kristen [name]  TDS:[id number] DOB: [birth year] DOA: 10/30/2019 PCP: Lawerance Cruel, MD  Brief Narrative: 54 year old Caucasian female with PMH of morbid obesity, type 2 diabetes mellitus, chronic pain on opioids, hypertension, depression, anxiety, asthma, s/p cholecystectomy, CVA with residual vision loss, PCOS, sleep apnea who was brought to the ED by EMS from home for worsening abdominal painassociated with diarrhea. Recently treated for UTI with no improvement in abd pain.[place] home and largely immobile. Reported chronicdepressionand sometimes has suicidal ideations but none now. ED Course: Afebrile,noted to be hypertensive, tachycardic.Found to have leukocytosis of 26K and blood sugar elevated in the 300s. CT abdomen pelvis without contrast (no contrast due to renal dysfunction)showed perforated sigmoid diverticulitis with a large 12 cm abscess in addition to complex fluid collections extending into the patient's pelvis. ED provider spoke to surgeon on call Dr. Gala Lewandowsky who indicated no surgical need at this time and recommended IR consult for drainage of the abscess. Hospital course: [facility] and IR consulted. 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  Third drain was placed 11/09/2019 for new abscess. C. difficile negative On  Zosyn Continue Dilaudid for pain control,she still believes she needs a Dilaudid for intense pain. Drains placed by IR Followed by surgery WBC down to 10 from 14 yesterday  From 16 previously.  Currently tolerating a soft diet. Advance diet per general surgery. CT scan1/17/2021new abscess.  IR recommends to repeat CT scan in 1 week which will be sometime middle of next week.  #2 AKI on CKD stage III A-resolved with IV fluids. Last creatinine we have is 0.90.   #3 type 2 diabetes with hyperglycemia-continue Lantus 60 units nightly with SSI.  Blood sugar 202, 198, 150, 158, 96. Continue Lantus and sliding scale insulin.#4 essential hypertension-blood pressure 143/69 on continue ACE inhibitor.    #5 history of asthma stable continue inhalers and encourage incentive spirometry. Continue Singulair and Dulera.  #6 morbid obesity needs ongoing counseling regarding weight loss  #7 depression continue home meds Zoloft 200 mg daily.  #8 pyuria with rare bacteriuria initially treated for UTI.   #9 thrush-nystatin ordered   #10 history of PE-will restart Eliquis and DC heparin as there are no plans for surgery  Nutrition Problem: Increased nutrient needs Etiology: acute illness     Signs/Symptoms: estimated needs    Interventions: Prostat(Ensure Max)  Estimated body mass index is 58.93 kg/m as calculated from the following:   Height as of this encounter: 5\' 8"  (1.727 m).   Weight as of this encounter: 175.8 kg. DVT prophylaxis:Heparin will need to change to Eliquis prior to discharge. Code Status:Full code discussed with patient  Family Communication:Discussed with patient   disposition Plan-patient came from home. She lives with her sister but according to her the sister worked all day. She would be discharged to a  skilled nursing facility.need clearance from  Surgery for DC.  Checked with interventional radiology they are okay with her being  discharged and follow-up in 1 week with their clinic to check the drains.  I will order Covid test today for discharge tomorrow to SNF.  Discussed with case management.  Consultants:  IR and general surgery   Procedures:3 abdominal drains placed by IR  AntimicrobialsZosyn  Subjective:  He is resting in bed no new complaints other than the nausea Objective: Vitals:   11/10/19 1348 11/10/19 1941 11/10/19 2112 11/11/19 0434  BP: (!) 153/79  (!) 145/60 (!) 143/69  Pulse: 80  79 77  Resp: 18  18 19   Temp: 97.6 F (36.4 C)  98.5 F (36.9 C) 98.2 F (36.8 C)  TempSrc: Oral  Oral Oral  SpO2: 95% 94% 95% 98%  Weight:      Height:        Intake/Output Summary (Last 24 hours) at 11/11/2019 1011 Last data filed at 11/11/2019 0516 Gross per 24 hour  Intake 1104.63 ml  Output 1900 ml  Net -795.37 ml   Filed Weights   10/31/19 0200  Weight: (!) 175.8 kg    Examination:  General exam: Appears calm and comfortable  Respiratory system: Clear to auscultation. Respiratory effort normal. Cardiovascular system: S1 & S2 heard, RRR. No JVD, murmurs, rubs, gallops or clicks. No pedal edema. Gastrointestinal system: Abdomen is nondistended, soft and nontender. No organomegaly or masses felt. Normal bowel sounds heard.  3 drains in place with yellow to slightly blood-tinged. Central nervous system: Alert and oriented. No focal neurological deficits. Extremities: Symmetric 5 x 5 power. Skin: No rashes, lesions or ulcers Psychiatry: Judgement and insight appear normal. Mood & affect appropriate.     Data Reviewed: I have personally reviewed following labs and imaging studies  CBC: Recent Labs  Lab 11/07/19 0550 11/08/19 0509 11/09/19 0459 11/10/19 0448 11/11/19 0555  WBC 15.2* 14.6* 16.0* 14.0* 10.7*  HGB 8.9* 8.9* 9.7* 9.3* 8.8*  HCT 30.5* 30.2* 32.8* 31.5* 30.4*  MCV 87.9 87.0 88.4 88.0 88.4  PLT 333 343 299 301 784   Basic Metabolic Panel: Recent Labs  Lab  11/05/19 0525 11/06/19 0816 11/07/19 0550 11/10/19 0448  NA 136 136  --  138  K 4.1 4.4  --  4.2  CL 104 104  --  103  CO2 23 26  --  27  GLUCOSE 135* 129*  --  118*  BUN 17 14  --  14  CREATININE 0.92 0.90  --  0.88  CALCIUM 8.5* 8.4*  --  8.7*  MG 1.6* 1.7 1.7 1.8  PHOS 2.8 3.2 3.2  --    GFR: Estimated Creatinine Clearance: 126.9 mL/min (by C-G formula based on SCr of 0.88 mg/dL). Liver Function Tests: Recent Labs  Lab 11/05/19 0525  AST 19  ALT 25  ALKPHOS 268*  BILITOT 0.3  PROT 6.3*  ALBUMIN 2.0*   No results for input(s): LIPASE, AMYLASE in the last 168 hours. No results for input(s): AMMONIA in the last 168 hours. Coagulation Profile: No results for input(s): INR, PROTIME in the last 168 hours. Cardiac Enzymes: No results for input(s): CKTOTAL, CKMB, CKMBINDEX, TROPONINI in the last 168 hours. BNP (last 3 results) No results for input(s): PROBNP in the last 8760 hours. HbA1C: No results for input(s): HGBA1C in the last 72 hours. CBG: Recent Labs  Lab 11/10/19 1612 11/10/19 2120 11/11/19 0010 11/11/19 0432 11/11/19 0803  GLUCAP 202* 198* 150* 158*  96   Lipid Profile: No results for input(s): CHOL, HDL, LDLCALC, TRIG, CHOLHDL, LDLDIRECT in the last 72 hours. Thyroid Function Tests: No results for input(s): TSH, T4TOTAL, FREET4, T3FREE, THYROIDAB in the last 72 hours. Anemia Panel: No results for input(s): VITAMINB12, FOLATE, FERRITIN, TIBC, IRON, RETICCTPCT in the last 72 hours. Sepsis Labs: No results for input(s): PROCALCITON, LATICACIDVEN in the last 168 hours.  Recent Results (from the past 240 hour(s))  Aerobic/Anaerobic Culture (surgical/deep wound)     Status: None (Preliminary result)   Collection Time: 11/09/19  1:55 PM   Specimen: Abscess  Result Value Ref Range Status   Specimen Description   Final    ABSCESS PELVIC Performed at [facility] [address], [place], [place] [address]    Special Requests   Final     Normal Performed at [name], [place] [address] [place], [place] [address]    Gram Stain   Final    ABUNDANT WBC PRESENT, PREDOMINANTLY PMN MODERATE GRAM POSITIVE COCCI    Culture   Final    CULTURE REINCUBATED FOR BETTER GROWTH Performed at [facility], [place] [address], [place], [place] [address]    Report Status PENDING  Incomplete         Radiology Studies: CT IMAGE GUIDED DRAINAGE BY PERCUTANEOUS CATHETER  Result Date: 11/09/2019 INDICATION: Diverticulitis, abdominopelvic abscesses. EXAM: CT DRAINAGE PELVIC ABSCESS Date:  11/09/2019 11/09/2019 2:23 pm Radiologist:  M. Daryll Brod, MD Guidance:  CT FLUOROSCOPY TIME:  None. MEDICATIONS: 1% lidocaine local ANESTHESIA/SEDATION: Moderate (conscious) sedation was employed during this procedure. A total of Versed 4.0 mg and Fentanyl 100 mcg was administered intravenously. Moderate Sedation Time: 12 minutes. The patient's level of consciousness and vital signs were monitored continuously by radiology nursing throughout the procedure under my direct supervision. CONTRAST:  None. COMPLICATIONS: None immediate. PROCEDURE: Informed consent was obtained from the patient following explanation of the procedure, risks, benefits and alternatives. The patient understands, agrees and consents for the procedure. All questions were addressed. A time out was performed. Maximal barrier sterile technique utilized including caps, mask, sterile gowns, sterile gloves, large sterile drape, hand hygiene, and ChloraPrep. Previous imaging reviewed. Patient position prone. Noncontrast localization CT performed. The pelvic fluid collection in the cul-de-sac was localized and marked for a left trans gluteal approach. Under sterile conditions and local anesthesia, an 18 gauge 15 cm access needle was advanced percutaneously from a left trans gluteal approach to the fluid collection. Needle position confirmed with CT. Syringe aspiration yielded  purulent fluid. Sample sent for culture. Guidewire inserted followed by tract dilatation to insert a 10 [place] drain. Drain catheter position confirmed with CT. Syringe aspiration yielded 30 cc purulent fluid. Catheter secured with Prolene suture and connected to external suction bulb. Sterile dressing applied. No immediate complication. Patient tolerated the procedure well. IMPRESSION: Successful CT-guided trans gluteal pelvic abscess drain placement as above. Electronically Signed   By: Jerilynn Mages.  Shick M.D.   On: 11/09/2019 14:47        Scheduled Meds:  acetaminophen  1,000 mg Oral Q6H   atorvastatin  40 mg Oral Daily   cholecalciferol  2,000 Units Oral Daily   feeding supplement (PRO-STAT SUGAR FREE 64)  30 mL Oral BID   insulin aspart  0-20 Units Subcutaneous Q4H   insulin aspart  10 Units Subcutaneous TID WC   insulin glargine  60 Units Subcutaneous QHS   lip balm  1 application Topical BID   methocarbamol  1,000 mg Oral TID   mometasone-formoterol  2 puff Inhalation BID   montelukast  10 mg Oral QHS   nystatin  5 mL Oral QID   polyethylene glycol  17 g Oral BID   Ensure Max Protein  11 oz Oral BID   saccharomyces boulardii  250 mg Oral BID   sertraline  200 mg Oral Daily   sodium chloride flush  3 mL Intravenous Q12H   sodium chloride flush  3 mL Intravenous Q12H   sodium chloride flush  5 mL Intracatheter Q8H   sodium chloride flush  5 mL Intracatheter Q8H   sodium chloride flush  5 mL Intracatheter Q8H   Continuous Infusions:  sodium chloride     heparin 3,200 Units/hr (11/11/19 0329)   piperacillin-tazobactam (ZOSYN)  IV 3.375 g (11/11/19 0501)     LOS: 12 days     Georgette Shell, MD [facility]  If 7PM-7AM, please contact night-coverage [email or web address] Password [facility] 11/11/2019, 10:11 AM

## 2019-11-11 NOTE — Progress Notes (Signed)
Referring Physician(s): Armandina Gemma  Supervising Physician: Markus Daft  Patient Status:  [facility] - In-pt  Chief Complaint: None  Subjective:  Multiple intra-abdominal fluid collections s/p RLQ and two LLQ drain placements in IR 10/31/2019 by Dr. Anselm Pancoast; s/p removal of one of the LLQ drains 0/45/4098; complicated by development of additional abdominopelvic fluid collection s/p left TG drain placement in IR 11/09/2019 by Dr. Annamaria Boots (patient has a total of 3 IR drains at this time- RLQ, mid abdominal, and left TG). Patient awake and alert laying in bed with no complaints at this time. RLQ, mid abdominal, and left TG drain sites c/d/i.   Allergies: Cafergot, Glucophage [metformin hcl], and Canagliflozin  Medications: Prior to Admission medications   Medication Sig Start Date End Date Taking? Authorizing Provider  acetaminophen (TYLENOL) 500 MG tablet Take 1,000 mg by mouth every 6 (six) hours as needed for moderate pain.   Yes [provider]  albuterol (ACCUNEB) 1.25 MG/3ML nebulizer solution Inhale 1 ampule into the lungs 3 (three) times daily as needed for wheezing or shortness of breath.  12/12/10  Yes [provider]  albuterol (VENTOLIN HFA) 108 (90 Base) MCG/ACT inhaler Inhale 2 puffs into the lungs every 4 (four) hours as needed for wheezing or shortness of breath.  01/04/14  Yes [provider]  amoxicillin-clavulanate (AUGMENTIN) 875-125 MG tablet Take 1 tablet by mouth 2 (two) times daily. 10/25/19  Yes [provider]  apixaban (ELIQUIS) 5 MG TABS tablet Take two tabs twice a day for 7 days, then take one tab twice a day for a year. Patient taking differently: Take 2.5 mg by mouth 2 (two) times daily.  03/24/17  Yes Florencia Reasons, MD  aspirin EC 81 MG tablet Take 81 mg by mouth daily.   Yes [provider]  atorvastatin (LIPITOR) 40 MG tablet Take 40 mg by mouth daily.   Yes [provider]  buPROPion (WELLBUTRIN XL) 150 MG 24 hr tablet  Take 150 mg by mouth every morning. 09/13/19  Yes [provider]  carisoprodol (SOMA) 350 MG tablet TAKE 1 TABLET (350 MG TOTAL) BY MOUTH 3 (THREE) TIMES DAILY. 09/15/19  Yes Meredith Staggers, MD  Cholecalciferol (VITAMIN D3) 2000 units capsule Take 2,000 Units by mouth daily.    Yes [provider]  Fluticasone-Salmeterol (ADVAIR) 500-50 MCG/DOSE AEPB Inhale 1 puff into the lungs every 12 (twelve) hours.   Yes [provider]  insulin aspart (NOVOLOG FLEXPEN) 100 UNIT/ML FlexPen Inject 15 Units into the skin 3 (three) times daily with meals. Patient taking differently: Inject 44 Units into the skin 3 (three) times daily with meals.  03/24/17  Yes Florencia Reasons, MD  insulin glargine (LANTUS) 100 UNIT/ML injection Inject 80 Units into the skin at bedtime.    Yes [provider]  lisinopril (PRINIVIL,ZESTRIL) 20 MG tablet Take 1 tablet (20 mg total) by mouth daily. 03/24/17 10/30/19 Yes Florencia Reasons, MD  montelukast (SINGULAIR) 10 MG tablet Take 10 mg by mouth at bedtime.    Yes [provider]  oxybutynin (DITROPAN) 5 MG tablet Take 5 mg by mouth 2 (two) times daily.  03/13/16  Yes [provider]  Oxycodone HCl 10 MG TABS Take 1 tablet (10 mg total) by mouth every 8 (eight) hours as needed. Patient taking differently: Take 10 mg by mouth every 8 (eight) hours as needed (pain).  08/31/19  Yes Bayard Hugger, NP  promethazine (PHENERGAN) 25 MG tablet Take 25 mg by mouth  every 6 (six) hours as needed for nausea or vomiting.    Yes [provider]  senna-docusate (SENOKOT-S) 8.6-50 MG tablet Take 1 tablet by mouth at bedtime. 03/24/17  Yes Florencia Reasons, MD  sertraline (ZOLOFT) 100 MG tablet Take 200 mg by mouth daily.    Yes [provider]  BD PEN NEEDLE NANO U/F 32G X 4 MM MISC 2 (two) times daily. as directed 03/13/16   [provider]  nystatin cream (MYCOSTATIN) Apply 1 application topically 2 (two) times daily. Patient not taking:  Reported on 10/31/2019 03/11/18   Huel Cote, NP     Vital Signs: BP (!) 143/69 (BP Location: Right Arm)   Pulse 77   Temp 98.2 F (36.8 C) (Oral)   Resp 19   Ht 5\' 8"  (1.727 m)   Wt (!) 387 lb 9.1 oz (175.8 kg)   SpO2 98%   BMI 58.93 kg/m   Physical Exam Vitals and nursing note reviewed.  Constitutional:      General: She is not in acute distress.    Appearance: Normal appearance.  Pulmonary:     Effort: Pulmonary effort is normal. No respiratory distress.  Abdominal:     Comments: RLQ, mid abdominal, and left TG drain sites without tenderness, erythema, drainage, or active bleeding; all drains flush/aspirate without resistance. RLQ drain with approximately 25 cc purulent fluid in suction bulb; Mid abdominal drain with approximately 10 cc purulent fluid in suction bulb; Left TG drain with approximately 10 cc purulent fluid in suction bulb.  Skin:    General: Skin is warm and dry.  Neurological:     Mental Status: She is alert and oriented to person, place, and time.  Psychiatric:        Mood and Affect: Mood normal.        Behavior: Behavior normal.     Imaging: CT ABDOMEN PELVIS WO CONTRAST  Result Date: 11/07/2019 CLINICAL DATA:  Right upper quadrant abdominal pain with fever. EXAM: CT ABDOMEN AND PELVIS WITHOUT CONTRAST TECHNIQUE: Multidetector CT imaging of the abdomen and pelvis was performed following the standard protocol without IV contrast. COMPARISON:  CT dated 10/30/2019 FINDINGS: Lower chest: There is a small right-sided pleural effusion. There is atelectasis at the lung bases.The heart size is borderline enlarged. The intracardiac blood pool is hypodense relative to the adjacent myocardium consistent with anemia. Hepatobiliary: The liver is enlarged with borderline hepatic steatosis. Status post cholecystectomy.There is no biliary ductal dilation. Pancreas: Normal contours without ductal dilatation. No peripancreatic fluid collection. Spleen: The spleen is  enlarged measuring approximately 15 cm craniocaudad. Adrenals/Urinary Tract: --Adrenal glands: No adrenal hemorrhage. --Right kidney/ureter: No hydronephrosis or perinephric hematoma. --Left kidney/ureter: No hydronephrosis or perinephric hematoma. --Urinary bladder: Bladder is underdistended, however there is diffuse bladder wall thickening. Stomach/Bowel: --Stomach/Duodenum: No hiatal hernia or other gastric abnormality. Normal duodenal course and caliber. --Small bowel: There is a matted appearance of multiple small bowel loops in the lobe midline abdomen. Evaluation is limited by lack of IV and oral contrast. Developing fistula in this location would be difficult to exclude at this time. --Colon: Again noted are findings concerning for perforated sigmoid diverticulitis. There is moderate wall thickening of the sigmoid colon. --Appendix: Not visualized. No right lower quadrant inflammation or free fluid. Vascular/Lymphatic: Atherosclerotic calcification is present within the non-aneurysmal abdominal aorta, without hemodynamically significant stenosis. --there are multiple enlarged retroperitoneal lymph nodes, favored to be reactive in etiology. --there prominent mesenteric lymph nodes, favored to be reactive in etiology. --  prominent pelvic lymph nodes are noted, favored to be reactive. Reproductive: There is a masslike structure in the patient's left hemipelvis measuring approximately 3.8 x 2.8 cm. This is favored to represent the left ovary. Other: There is a well-formed fluid collection in the patient's pelvis measuring approximately 5.8 x 6.3 cm. This is favored to represent developing abscess. Extensive inflammatory changes are again noted in the patient's low midline abdomen/high pelvis. Two percutaneous drains are noted. The previously demonstrated fluid collection in the midline abdomen has substantially improved from prior study. There is a small residual collection along the patient's right hemipelvis  measuring approximately 4.9 x 1.7 cm. Evaluation of additional collections is limited by lack of both oral and IV contrast. Musculoskeletal. No acute displaced fractures. IMPRESSION: 1. Evaluation limited by lack of oral and IV contrast. 2. New walled off fluid collection in the patient's pelvis concerning for a developing abscess. Currently, this is amenable to percutaneous drainage as clinically indicated. 3. Status post placement of 2 percutaneous drains with significant interval improvement in the previously demonstrated large abdominal abscesses. Residual inflammatory changes and small pockets of free fluid are noted in the patient's low midline abdomen/high pelvis. 4. Diffuse wall thickening of the urinary bladder, favored to be reactive. Correlation with urinalysis is recommended to help exclude cystitis. 5. Small right-sided pleural effusion. 6. Hepatomegaly with probable underlying hepatic steatosis. 7. Nonspecific splenomegaly. 8. Enlarged retroperitoneal and mesenteric lymph nodes, presumably reactive. Aortic Atherosclerosis (ICD10-I70.0). Electronically Signed   By: Constance Holster M.D.   On: 11/07/2019 21:28   CT IMAGE GUIDED DRAINAGE BY PERCUTANEOUS CATHETER  Result Date: 11/09/2019 INDICATION: Diverticulitis, abdominopelvic abscesses. EXAM: CT DRAINAGE PELVIC ABSCESS Date:  11/09/2019 11/09/2019 2:23 pm Radiologist:  M. Daryll Brod, MD Guidance:  CT FLUOROSCOPY TIME:  None. MEDICATIONS: 1% lidocaine local ANESTHESIA/SEDATION: Moderate (conscious) sedation was employed during this procedure. A total of Versed 4.0 mg and Fentanyl 100 mcg was administered intravenously. Moderate Sedation Time: 12 minutes. The patient's level of consciousness and vital signs were monitored continuously by radiology nursing throughout the procedure under my direct supervision. CONTRAST:  None. COMPLICATIONS: None immediate. PROCEDURE: Informed consent was obtained from the patient following explanation of the  procedure, risks, benefits and alternatives. The patient understands, agrees and consents for the procedure. All questions were addressed. A time out was performed. Maximal barrier sterile technique utilized including caps, mask, sterile gowns, sterile gloves, large sterile drape, hand hygiene, and ChloraPrep. Previous imaging reviewed. Patient position prone. Noncontrast localization CT performed. The pelvic fluid collection in the cul-de-sac was localized and marked for a left trans gluteal approach. Under sterile conditions and local anesthesia, an 18 gauge 15 cm access needle was advanced percutaneously from a left trans gluteal approach to the fluid collection. Needle position confirmed with CT. Syringe aspiration yielded purulent fluid. Sample sent for culture. Guidewire inserted followed by tract dilatation to insert a 10 [place] drain. Drain catheter position confirmed with CT. Syringe aspiration yielded 30 cc purulent fluid. Catheter secured with Prolene suture and connected to external suction bulb. Sterile dressing applied. No immediate complication. Patient tolerated the procedure well. IMPRESSION: Successful CT-guided trans gluteal pelvic abscess drain placement as above. Electronically Signed   By: Jerilynn Mages.  Shick M.D.   On: 11/09/2019 14:47    Labs:  CBC: Recent Labs    11/08/19 0509 11/09/19 0459 11/10/19 0448 11/11/19 0555  WBC 14.6* 16.0* 14.0* 10.7*  HGB 8.9* 9.7* 9.3* 8.8*  HCT 30.2* 32.8* 31.5* 30.4*  PLT 343 299 301 305  COAGS: Recent Labs    11/01/19 0526 11/02/19 0503 11/03/19 0618 11/04/19 0746  INR 1.4* 1.3* 1.3* 1.2    BMP: Recent Labs    11/04/19 0746 11/05/19 0525 11/06/19 0816 11/10/19 0448  NA 135  135 136 136 138  K 4.3  4.2 4.1 4.4 4.2  CL 104  104 104 104 103  CO2 24  24 23 26 27   GLUCOSE 130*  131* 135* 129* 118*  BUN 21*  21* 17 14 14   CALCIUM 8.3*  8.2* 8.5* 8.4* 8.7*  CREATININE 1.04*  0.92 0.92 0.90 0.88  GFRNONAA >60  >60 >60 >60  >60  GFRAA >60  >60 >60 >60 >60    LIVER FUNCTION TESTS: Recent Labs    11/02/19 0503 11/03/19 0618 11/04/19 0746 11/05/19 0525  BILITOT 0.7 0.3 0.4 0.3  AST 54* 31 25 19   ALT 43 33 34 25  ALKPHOS 178* 201* 274* 268*  PROT 6.4* 6.1* 6.1* 6.3*  ALBUMIN 2.1* 1.9* 1.9* 2.0*    Assessment and Plan:  Multiple intra-abdominal fluid collections s/p RLQ and two LLQ drain placements in IR 10/31/2019 by Dr. Anselm Pancoast; s/p removal of one of the LLQ drains 10/23/7251; complicated by development of additional abdominopelvic fluid collection s/p left TG drain placement in IR 11/09/2019 by Dr. Annamaria Boots (patient has a total of 3 IR drains at this time- RLQ, mid abdominal, and left TG). RLQ drain stable with approximately 25 cc purulent fluid in suction bulb (additional 145 cc output in past 24 hours per chart). Mid abdominal drain stable with approximately 10 cc purulent fluid in suction bulb (additional 15 cc output in past 24 hours per chart). Left TG drain stable with approximately 10 cc purulent fluid in suction bulb (additional 40 cc output in past 24 hours per chart). Continue current drain management- continue with Qshift flushes/monitor of output. Plan for repeat CT/possible drain injection when output <10 cc/day (assess for possible removal). Further plans perTRH/CCS- appreciate and agree with management.  If patient is to be discharged, below are discharge instructions: - Flush each drain once daily with 5-10 cc NS flush (patient will need an order for flushes). - Record output from each drain once daily. - Follow-up at drain clinic 10-14 days after discharge for CT/possible drain injection (assess for possible drain removal)- order placed to facilitate this.  IR to follow.   Electronically Signed: Earley Abide, PA-C 11/11/2019, 11:04 AM   I spent a total of 25 Minutes at the the patient's bedside AND on the patient's hospital floor or unit, greater than 50% of which was  counseling/coordinating care for intraabdominal fluid collections s/p drain placements x3.

## 2019-11-12 DIAGNOSIS — B370 Candidal stomatitis: Secondary | ICD-10-CM | POA: Diagnosis not present

## 2019-11-12 DIAGNOSIS — F331 Major depressive disorder, recurrent, moderate: Secondary | ICD-10-CM | POA: Diagnosis not present

## 2019-11-12 DIAGNOSIS — N1831 Chronic kidney disease, stage 3a: Secondary | ICD-10-CM | POA: Diagnosis not present

## 2019-11-12 DIAGNOSIS — F411 Generalized anxiety disorder: Secondary | ICD-10-CM | POA: Diagnosis not present

## 2019-11-12 DIAGNOSIS — K630 Abscess of intestine: Secondary | ICD-10-CM | POA: Diagnosis not present

## 2019-11-12 DIAGNOSIS — R739 Hyperglycemia, unspecified: Secondary | ICD-10-CM | POA: Diagnosis not present

## 2019-11-12 DIAGNOSIS — E1322 Other specified diabetes mellitus with diabetic chronic kidney disease: Secondary | ICD-10-CM | POA: Diagnosis not present

## 2019-11-12 DIAGNOSIS — G894 Chronic pain syndrome: Secondary | ICD-10-CM | POA: Diagnosis not present

## 2019-11-12 DIAGNOSIS — Z6841 Body Mass Index (BMI) 40.0 and over, adult: Secondary | ICD-10-CM | POA: Diagnosis not present

## 2019-11-12 DIAGNOSIS — K5792 Diverticulitis of intestine, part unspecified, without perforation or abscess without bleeding: Secondary | ICD-10-CM | POA: Diagnosis not present

## 2019-11-12 DIAGNOSIS — Z86711 Personal history of pulmonary embolism: Secondary | ICD-10-CM | POA: Diagnosis not present

## 2019-11-12 DIAGNOSIS — N739 Female pelvic inflammatory disease, unspecified: Secondary | ICD-10-CM | POA: Diagnosis not present

## 2019-11-12 DIAGNOSIS — E1122 Type 2 diabetes mellitus with diabetic chronic kidney disease: Secondary | ICD-10-CM | POA: Diagnosis not present

## 2019-11-12 DIAGNOSIS — K651 Peritoneal abscess: Secondary | ICD-10-CM | POA: Diagnosis not present

## 2019-11-12 DIAGNOSIS — R279 Unspecified lack of coordination: Secondary | ICD-10-CM | POA: Diagnosis not present

## 2019-11-12 DIAGNOSIS — M6281 Muscle weakness (generalized): Secondary | ICD-10-CM | POA: Diagnosis not present

## 2019-11-12 DIAGNOSIS — E1165 Type 2 diabetes mellitus with hyperglycemia: Secondary | ICD-10-CM | POA: Diagnosis not present

## 2019-11-12 DIAGNOSIS — G4730 Sleep apnea, unspecified: Secondary | ICD-10-CM | POA: Diagnosis not present

## 2019-11-12 DIAGNOSIS — R52 Pain, unspecified: Secondary | ICD-10-CM | POA: Diagnosis not present

## 2019-11-12 DIAGNOSIS — E1365 Other specified diabetes mellitus with hyperglycemia: Secondary | ICD-10-CM | POA: Diagnosis not present

## 2019-11-12 DIAGNOSIS — R3989 Other symptoms and signs involving the genitourinary system: Secondary | ICD-10-CM | POA: Diagnosis not present

## 2019-11-12 DIAGNOSIS — R1084 Generalized abdominal pain: Secondary | ICD-10-CM | POA: Diagnosis not present

## 2019-11-12 DIAGNOSIS — E7800 Pure hypercholesterolemia, unspecified: Secondary | ICD-10-CM | POA: Diagnosis not present

## 2019-11-12 DIAGNOSIS — R0602 Shortness of breath: Secondary | ICD-10-CM | POA: Diagnosis not present

## 2019-11-12 DIAGNOSIS — J45998 Other asthma: Secondary | ICD-10-CM | POA: Diagnosis not present

## 2019-11-12 DIAGNOSIS — N179 Acute kidney failure, unspecified: Secondary | ICD-10-CM | POA: Diagnosis not present

## 2019-11-12 DIAGNOSIS — Z743 Need for continuous supervision: Secondary | ICD-10-CM | POA: Diagnosis not present

## 2019-11-12 DIAGNOSIS — I69398 Other sequelae of cerebral infarction: Secondary | ICD-10-CM | POA: Diagnosis not present

## 2019-11-12 DIAGNOSIS — E1121 Type 2 diabetes mellitus with diabetic nephropathy: Secondary | ICD-10-CM | POA: Diagnosis not present

## 2019-11-12 DIAGNOSIS — M5136 Other intervertebral disc degeneration, lumbar region: Secondary | ICD-10-CM | POA: Diagnosis not present

## 2019-11-12 DIAGNOSIS — B9629 Other Escherichia coli [E. coli] as the cause of diseases classified elsewhere: Secondary | ICD-10-CM | POA: Diagnosis not present

## 2019-11-12 DIAGNOSIS — F324 Major depressive disorder, single episode, in partial remission: Secondary | ICD-10-CM | POA: Diagnosis not present

## 2019-11-12 DIAGNOSIS — I10 Essential (primary) hypertension: Secondary | ICD-10-CM | POA: Diagnosis not present

## 2019-11-12 DIAGNOSIS — J45909 Unspecified asthma, uncomplicated: Secondary | ICD-10-CM | POA: Diagnosis not present

## 2019-11-12 DIAGNOSIS — K5720 Diverticulitis of large intestine with perforation and abscess without bleeding: Secondary | ICD-10-CM | POA: Diagnosis not present

## 2019-11-12 DIAGNOSIS — E119 Type 2 diabetes mellitus without complications: Secondary | ICD-10-CM | POA: Diagnosis not present

## 2019-11-12 DIAGNOSIS — U071 COVID-19: Secondary | ICD-10-CM | POA: Diagnosis not present

## 2019-11-12 LAB — BASIC METABOLIC PANEL
Anion gap: 8 (ref 5–15)
BUN: 17 mg/dL (ref 6–20)
CO2: 28 mmol/L (ref 22–32)
Calcium: 8.7 mg/dL — ABNORMAL LOW (ref 8.9–10.3)
Chloride: 103 mmol/L (ref 98–111)
Creatinine, Ser: 1.01 mg/dL — ABNORMAL HIGH (ref 0.44–1.00)
GFR calc Af Amer: >60 mL/min (ref >60)
GFR calc non Af Amer: >60 mL/min (ref >60)
Glucose, Bld: 88 mg/dL (ref 70–99)
Potassium: 4.3 mmol/L (ref 3.5–5.1)
Sodium: 139 mmol/L (ref 135–145)

## 2019-11-12 LAB — GLUCOSE, CAPILLARY
Glucose-Capillary: 149 mg/dL — ABNORMAL HIGH (ref 70–99)
Glucose-Capillary: 151 mg/dL — ABNORMAL HIGH (ref 70–99)
Glucose-Capillary: 83 mg/dL (ref 70–99)
Glucose-Capillary: 92 mg/dL (ref 70–99)

## 2019-11-12 LAB — CBC
HCT: 30.9 % — ABNORMAL LOW (ref 36.0–46.0)
Hemoglobin: 8.8 g/dL — ABNORMAL LOW (ref 12.0–15.0)
MCH: 25.1 pg — ABNORMAL LOW (ref 26.0–34.0)
MCHC: 28.5 g/dL — ABNORMAL LOW (ref 30.0–36.0)
MCV: 88.3 fL (ref 80.0–100.0)
Platelets: 286 10*3/uL (ref 150–400)
RBC: 3.5 MIL/uL — ABNORMAL LOW (ref 3.87–5.11)
RDW: 15.3 % (ref 11.5–15.5)
WBC: 8.6 10*3/uL (ref 4.0–10.5)
nRBC: 0 % (ref 0.0–0.2)

## 2019-11-12 LAB — MAGNESIUM: Magnesium: 1.9 mg/dL (ref 1.7–2.4)

## 2019-11-12 MED ORDER — MENTHOL 3 MG MT LOZG: 1.0000 | LOZENGE | OROMUCOSAL | 12 refills | Status: DC | PRN

## 2019-11-12 MED ORDER — SACCHAROMYCES BOULARDII 250 MG PO CAPS: 250.0000 mg | ORAL_CAPSULE | Freq: Two times a day (BID) | ORAL | 0 refills | Status: DC

## 2019-11-12 MED ORDER — PHENOL 1.4 % MT LIQD: 1.0000 | OROMUCOSAL | 0 refills | Status: DC | PRN

## 2019-11-12 MED ORDER — INSULIN GLARGINE 100 UNIT/ML ~~LOC~~ SOLN: 60.0000 [IU] | Freq: Every day | SUBCUTANEOUS | 11 refills | Status: DC

## 2019-11-12 MED ORDER — LIP MEDEX EX OINT: 1.0000 "application " | TOPICAL_OINTMENT | Freq: Two times a day (BID) | CUTANEOUS | 0 refills | Status: DC

## 2019-11-12 MED ORDER — HYDROCORTISONE (PERIANAL) 2.5 % EX CREA: 1.0000 "application " | TOPICAL_CREAM | Freq: Four times a day (QID) | CUTANEOUS | 0 refills | Status: DC | PRN

## 2019-11-12 MED ORDER — POLYETHYLENE GLYCOL 3350 17 G PO PACK: 17.0000 g | PACK | Freq: Two times a day (BID) | ORAL | 0 refills | Status: DC

## 2019-11-12 MED ORDER — OXYCODONE HCL 10 MG PO TABS: 10.0000 mg | ORAL_TABLET | Freq: Three times a day (TID) | ORAL | 0 refills | Status: DC | PRN

## 2019-11-12 MED ORDER — HYDROCORTISONE 1 % EX CREA: 1.0000 "application " | TOPICAL_CREAM | Freq: Three times a day (TID) | CUTANEOUS | 0 refills | Status: DC | PRN

## 2019-11-12 MED ORDER — CEPHALEXIN 500 MG PO CAPS: 500.0000 mg | ORAL_CAPSULE | Freq: Four times a day (QID) | ORAL | 0 refills | Status: AC

## 2019-11-12 MED ORDER — METHOCARBAMOL 500 MG PO TABS: 1000.0000 mg | ORAL_TABLET | Freq: Three times a day (TID) | ORAL | 0 refills | Status: DC

## 2019-11-12 MED ORDER — METRONIDAZOLE 500 MG PO TABS: 500.0000 mg | ORAL_TABLET | Freq: Three times a day (TID) | ORAL | 0 refills | Status: AC

## 2019-11-12 MED ORDER — APIXABAN 2.5 MG PO TABS: 2.5000 mg | ORAL_TABLET | Freq: Two times a day (BID) | ORAL | 0 refills | Status: DC

## 2019-11-12 MED ORDER — AMOXICILLIN-POT CLAVULANATE 875-125 MG PO TABS: 1.0000 | ORAL_TABLET | Freq: Two times a day (BID) | ORAL | 0 refills | Status: DC

## 2019-11-12 MED ORDER — DOXYCYCLINE HYCLATE 50 MG PO CAPS: 100.0000 mg | ORAL_CAPSULE | Freq: Two times a day (BID) | ORAL | 0 refills | Status: AC

## 2019-11-12 MED ORDER — ENSURE MAX PROTEIN PO LIQD: 11.0000 [oz_av] | Freq: Two times a day (BID) | ORAL | 0 refills | Status: DC

## 2019-11-12 MED ORDER — AMOXICILLIN-POT CLAVULANATE 875-125 MG PO TABS
1.0000 | ORAL_TABLET | Freq: Two times a day (BID) | ORAL | Status: DC
  Administered 2019-11-12: 1 via ORAL
  Filled 2019-11-12: qty 1

## 2019-11-12 MED ORDER — NYSTATIN 100000 UNIT/ML MT SUSP: 5.0000 mL | Freq: Four times a day (QID) | OROMUCOSAL | 0 refills | Status: DC

## 2019-11-12 MED ORDER — PRO-STAT SUGAR FREE PO LIQD: 30.0000 mL | Freq: Two times a day (BID) | ORAL | 0 refills | Status: DC

## 2019-11-12 NOTE — Discharge Instructions (Signed)
-   Flush each drain once daily with 5-10 cc NS flush (patient will need an order for flushes). - Record output from each drain once daily. - Follow-up at drain clinic 10-14 days after discharge for CT/possible drain injection (assess for possible drain removal)- order placed to facilitate this.

## 2019-11-12 NOTE — Progress Notes (Signed)
Patient ID: Kristen [name], female   DOB: Dec 11, 1965, 54 y.o.   MRN: [id number]       Subjective: Continues to feel quite a bit better.  Eating better.  Pain seems improved.  ROS: See above, otherwise other systems negative  Objective: Vital signs in last 24 hours: Temp:  [97.7 F (36.5 C)-98.4 F (36.9 C)] 98.2 F (36.8 C) (01/22 0441) Pulse Rate:  [75-80] 80 (01/22 0441) Resp:  [18] 18 (01/22 0441) BP: (131-155)/(55-79) 155/79 (01/22 0441) SpO2:  [93 %-96 %] 95 % (01/22 0939) Last BM Date: 11/11/19  Intake/Output from previous day: 01/21 0701 - 01/22 0700 In: 192 [I.V.:15; IV Piggyback:147] Out: 8768 [Urine:1700; Drains:17] Intake/Output this shift: Total I/O In: 236 [P.O.:236] Out: 1000 [Urine:1000]  PE: Heart: regular Lungs: CTAB Abd: soft, less tender, all drains just emptied with no output noted in any of the 3 drains.  +BS, obese  Lab Results:  Recent Labs    11/11/19 0555 11/12/19 0528  WBC 10.7* 8.6  HGB 8.8* 8.8*  HCT 30.4* 30.9*  PLT 305 286   BMET Recent Labs    11/10/19 0448 11/12/19 0528  NA 138 139  K 4.2 4.3  CL 103 103  CO2 27 28  GLUCOSE 118* 88  BUN 14 17  CREATININE 0.88 1.01*  CALCIUM 8.7* 8.7*   PT/INR No results for input(s): LABPROT, INR in the last 72 hours. CMP     Component Value Date/Time   NA 139 11/12/2019 0528   NA 138 04/14/2017 1348   K 4.3 11/12/2019 0528   K 5.1 04/14/2017 1348   CL 103 11/12/2019 0528   CO2 28 11/12/2019 0528   CO2 27 04/14/2017 1348   GLUCOSE 88 11/12/2019 0528   GLUCOSE 257 (H) 04/14/2017 1348   BUN 17 11/12/2019 0528   BUN 16.3 04/14/2017 1348   CREATININE 1.01 (H) 11/12/2019 0528   CREATININE 1.14 (H) 04/26/2019 1205   CREATININE 1.2 (H) 04/14/2017 1348   CALCIUM 8.7 (L) 11/12/2019 0528   CALCIUM 9.8 04/14/2017 1348   PROT 6.3 (L) 11/05/2019 0525   PROT 7.1 04/14/2017 1348   ALBUMIN 2.0 (L) 11/05/2019 0525   ALBUMIN 3.4 (L) 04/14/2017 1348   AST 19 11/05/2019 0525   AST 24  04/14/2017 1348   ALT 25 11/05/2019 0525   ALT 24 04/14/2017 1348   ALKPHOS 268 (H) 11/05/2019 0525   ALKPHOS 75 04/14/2017 1348   BILITOT 0.3 11/05/2019 0525   BILITOT 0.40 04/14/2017 1348   GFRNONAA >60 11/12/2019 0528   GFRNONAA 55 (L) 04/26/2019 1205   GFRAA >60 11/12/2019 0528   GFRAA >60 04/26/2019 1205   Lipase     Component Value Date/Time   LIPASE 23 10/30/2019 1748       Studies/Results: No results found.  Anti-infectives: Anti-infectives (From admission, onward)   Start     Dose/Rate Route Frequency Ordered Stop   10/30/19 2130  piperacillin-tazobactam (ZOSYN) IVPB 3.375 g     3.375 g 12.5 mL/hr over 240 Minutes Intravenous Every 8 hours 10/30/19 2100     10/30/19 2030  piperacillin-tazobactam (ZOSYN) IVPB 2.25 g  Status:  Discontinued     2.25 g 100 mL/hr over 30 Minutes Intravenous Every 6 hours 10/30/19 2024 10/30/19 2100   10/30/19 1930  cefTRIAXone (ROCEPHIN) 2 g in sodium chloride 0.9 % 100 mL IVPB  Status:  Discontinued     2 g 200 mL/hr over 30 Minutes Intravenous  Once 10/30/19 1916 10/30/19 2024  10/30/19 1930  metroNIDAZOLE (FLAGYL) IVPB 500 mg  Status:  Discontinued     500 mg 100 mL/hr over 60 Minutes Intravenous  Once 10/30/19 1916 10/30/19 2024       Assessment/Plan Type 2 diabetes Hypertension Hx pulmonary embolism-chronic anticoagulation- Eliquis, now on hep gtt Hx of CVA with vision loss Chronic back pain - oxycodone/[facility] Asthma Sleep apnea Severe obesityBMI 58.93 Acute onCKD- resolved  Acute diverticulitis with large abscess IR drain placement x3 on 10/31/19, Dr. Markus Daft -Drains #1 & #3 remain, now along with new drain placed on 1/19 which is TG -WBC normal -patient is clinically stable for DC to SNF from surgical standpoint.  She is eating, pain is improved, and WBC normal and AF. -she has drain clinic appt and follow up in our office as well. -transition from zosyn to augmentin for 5  more days.  FEN:D3 diet ID: Zosyn 1/9>>1/22, Augmentin 1/22 --> [id number] Follow-up:Dr. Marcello Moores   LOS: 13 days    Henreitta Cea , [facility] 11/12/2019, 9:49 AM Please see Amion for pager number during day hours 7:00am-4:30pm or 7:00am -11:30am on weekends

## 2019-11-12 NOTE — Progress Notes (Signed)
RN phoned report to Precious @ 1456, Therapist, sports at [name]. PTAR has been set up, pt awaiting transport.

## 2019-11-12 NOTE — Progress Notes (Signed)
Pt is being discharged to SNF. Discharge instructions including medications and follow up appointments placed in packet for SNF. PT had no further questions at this time.

## 2019-11-12 NOTE — Discharge Summary (Addendum)
Physician Discharge Summary  Kristen [name] IOX:[id number] DOB: August 01, 1966 DOA: 10/30/2019  PCP: Lawerance Cruel, MD  Admit date: 10/30/2019 Discharge date: 11/12/2019  Recommendations for Outpatient Follow-up:  1. DC to snf with PT/OT 2. Follow up with [facility] as directed in 10-14 days. 3. Follow up with General Surgery as directed. 4. Flush drains with 50  q 8 hours. Output from drains should be recorded at the time of flushes and kept to be taken to follow up with General Surgery and drain clinic visit.   Contact information for follow-up providers    Leighton Ruff, MD. Go on 11/27/8339.   Specialty: General Surgery Why: Follow up appointment regarding diverticulitis scheduled for 11:20 AM. Please arrive 30 min prior to appointment time. Bring photo ID and insurance information with you.  Contact information: [place] [address] [phone]        Greggory Keen, MD Follow up.   Specialties: Interventional Radiology, Radiology Why: their office will call you with appointment date and time Contact information: [address] [place] [address] [phone]            Contact information for after-discharge care    [facility] .   Service: Skilled Nursing Contact information: [place] [phone]                 Discharge Diagnoses: Principal diagnosis is #1 1. Perforated sigmoid diverticulits with abscess 2. Percutaneous drains in abscesses 3. AKI on CKD III 4. DM II 5. HTN 6. History of Asthma 7. Morbid Obesity 8. Depression 9. Thrush 10. History of PE on eliquis  Discharge Condition: Fair  Disposition: SNF  Diet recommendation: Carbohydrate controlled/heart healthy diet  Filed Weights   10/31/19 0200  Weight: (!) 175.8 kg   History of present illness:  54 year old Caucasian female with PMH of morbid obesity,  type 2 diabetes mellitus, chronic pain on opioids, hypertension, depression, anxiety, asthma, s/p cholecystectomy, CVA with residual vision loss, PCOS, sleep apnea who was brought to the ED by EMS from home for worsening abdominal pain.  History obtained from the patient.  Reports abdominal pain started sometime second week of December and she was treated for a UTI then.  Denies any dysuria or increased urinary frequency at that time but says she was told her urine looked dirty.  It appears she was prescribed Macrobid but is unable to recollect the exact antibiotic.  Since her symptoms did not improve, she was again prescribed an antibiotic around Christmas which again transiently improved her symptoms.  Last week due to persistent abdominal pain, she had a televisit when she was prescribed Augmentin.  She took Augmentin for the last 1 week with no improvement in symptoms.  Notes worsening abdominal pain, poor appetite, nausea to the point that slight movement made the pain worse which prompted her to call EMS.   Generalized sharp, constant abdominal pain with most severe in the right lower quadrant, nonradiating associated with nausea and diarrhea.  Notes having 4-5 nonbloody loose watery stools for the last 3 to 4 days.  She took a stool softener few days ago thinking the pain may be due to constipation but has not taken any since then.  She had two loose stools after coming to the ED in the waiting room.  Denies any vomiting, fever.  Has history of headaches on and off, dyspnea on minimal  and cough exertion at baseline. Has chronic back pain which is worse since she has not taken her pain medications today.  Denies any dysuria or increased urinary frequency currently but says she is not passing much urine since she has not had anything to eat or drink except two bottles of water.  Denies prior history of C. difficile infection.  Last took Eliquis yesterday evening.  She has not taken any insulin today as  she did not eat.  She does not remember if she took Lantus yesterday evening.  At baseline, she is usually at home and largely immobile.  She has a sister but no other family members.  Reports feeling depressed and sometimes has suicidal ideations but none currently while in the ED.  ED Course: While in the ED, she was noted to be hypertensive, tachycardic but afebrile.  Found to have leukocytosis of 26,000 and blood sugar elevated in the 300s.  CT abdomen pelvis without contrast (no contrast due to renal dysfunction) showed perforated sigmoid diverticulitis with a large 12 cm abscess in addition to complex fluid collections extending into the patient's pelvis.  ED provider spoke to surgeon on call Dr. Gala Lewandowsky who indicated no surgical need at this time and recommended IR consult for drainage of the abscess.  1 L IV fluid bolus, Zosyn, Dilaudid for pain control has been ordered by the EDP.  Hospital Course:   54 year old Caucasian female with PMH of morbid obesity, type 2 diabetes mellitus, chronic pain on opioids, hypertension, depression, anxiety, asthma, s/p cholecystectomy, CVA with residual vision loss, PCOS, sleep apnea who was brought to the ED by EMS from home for worsening abdominal painassociated with diarrhea. Recently treated for UTI with no improvement in abd pain.[place] home and largely immobile. Reported chronicdepressionand sometimes has suicidal ideations but none now. ED Course: Afebrile,noted to be hypertensive, tachycardic.Found to have leukocytosis of 26K and blood sugar elevated in the 300s. CT abdomen pelvis without contrast (no contrast due to renal dysfunction)showed perforated sigmoid diverticulitis with a large 12 cm abscess in addition to complex fluid collections extending into the patient's pelvis. ED provider spoke to surgeon on call Dr. Gala Lewandowsky who indicated no surgical need at this time and recommended IR consult for drainage of the abscess. Hospital course:  [facility] and IR consulted. Patient underwent CT guided drain x3 placementand is on zosyn.tolerating a soft diet  Today the patient has been converted to PO Augmentin and has been cleared for discharge to SNF with instructions for q8 hours drain flushes and Q 8 hour recording of drain outputs that should be made available to the patient and taken with her to follow up in drain clinic. She will also follow up with general surgery as outpatient.  Today's assessment: S: The patient is resting comfortably. No new complaints. O: Vitals:  Vitals:   11/12/19 0441 11/12/19 0939  BP: (!) 155/79   Pulse: 80   Resp: 18   Temp: 98.2 F (36.8 C)   SpO2: 96% 95%   Exam:  Constitutional:  . The patient is awake, alert, and oriented x 3. No acute distress. Respiratory:  . No increased work of breathing. . No wheezes, rales, or rhonchi . No tactile fremitus Cardiovascular:  . Regular rate and rhythm . No murmurs, ectopy, or gallups. . No lateral PMI. No thrills. Abdomen:  . Abdomen is soft and non-distended . There remains mild tenderness in the Right and left lower quadrants . No hernias, masses, or organomegaly .  Bowel sounds are distant.  Musculoskeletal:  . No cyanosis, clubbing, or edema Skin:  . No rashes, lesions, ulcers . palpation of skin: no induration or nodules Neurologic:  . CN 2-12 intact . Sensation all 4 extremities intact Psychiatric:  . Mental status o Mood, affect appropriate o Orientation to person, place, time  . judgment and insight appear intact  Discharge Instructions  Discharge Instructions    Activity as tolerated - No restrictions   Complete by: As directed    Call MD for:  persistant nausea and vomiting   Complete by: As directed    Call MD for:  redness, tenderness, or signs of infection (pain, swelling, redness, odor or green/yellow discharge around incision site)   Complete by: As directed    Call MD for:  severe  uncontrolled pain   Complete by: As directed    Call MD for:  temperature >100.4   Complete by: As directed    Diet - low sodium heart healthy   Complete by: As directed    Discharge instructions   Complete by: As directed    DC to snf with PT/OT Follow up with [facility] as directed in 10-14 days. Follow up with General Surgery as directed. Flush drains with 50  q 8 hours. Output from drains should be recorded at the time of flushes and kept to be taken to follow up with General Surgery and drain clinic visit.   Increase activity slowly   Complete by: As directed      Allergies as of 11/12/2019      Reactions   Cafergot Other (See Comments)   Chest pain; ergotamine-caffiene   Glucophage [metformin Hcl] Anaphylaxis   Canagliflozin Swelling, Other (See Comments)   Legs Swell invokana Legs Swell      Medication List    STOP taking these medications   amoxicillin-clavulanate 875-125 MG tablet Commonly known as: AUGMENTIN   BD Pen Needle Nano U/F 32G X 4 MM Misc Generic drug: Insulin Pen Needle   carisoprodol 350 MG tablet Commonly known as: SOMA     TAKE these medications   acetaminophen 500 MG tablet Commonly known as: TYLENOL Take 1,000 mg by mouth every 6 (six) hours as needed for moderate pain.   albuterol 1.25 MG/3ML nebulizer solution Commonly known as: ACCUNEB Inhale 1 ampule into the lungs 3 (three) times daily as needed for wheezing or shortness of breath.   Ventolin HFA 108 (90 Base) MCG/ACT inhaler Generic drug: albuterol Inhale 2 puffs into the lungs every 4 (four) hours as needed for wheezing or shortness of breath.   apixaban 2.5 MG Tabs tablet Commonly known as: ELIQUIS Take 1 tablet (2.5 mg total) by mouth 2 (two) times daily. What changed:   medication strength  how much to take  how to take this  when to take this  additional instructions   aspirin EC 81 MG tablet Take 81 mg by mouth daily.   atorvastatin 40 MG tablet Commonly  known as: LIPITOR Take 40 mg by mouth daily.   buPROPion 150 MG 24 hr tablet Commonly known as: WELLBUTRIN XL Take 150 mg by mouth every morning.   cephALEXin 500 MG capsule Commonly known as: KEFLEX Take 1 capsule (500 mg total) by mouth 4 (four) times daily for 10 days.   doxycycline 50 MG capsule Commonly known as: VIBRAMYCIN Take 2 capsules (100 mg total) by mouth 2 (two) times daily for 5 days.   Ensure Max Protein Liqd Take 330 mLs (  11 oz total) by mouth 2 (two) times daily.   feeding supplement (PRO-STAT SUGAR FREE 64) Liqd Take 30 mLs by mouth 2 (two) times daily.   Fluticasone-Salmeterol 500-50 MCG/DOSE Aepb Commonly known as: ADVAIR Inhale 1 puff into the lungs every 12 (twelve) hours.   hydrocortisone 2.5 % rectal cream Commonly known as: ANUSOL-HC Apply 1 application topically 4 (four) times daily as needed for hemorrhoids.   hydrocortisone cream 1 % Apply 1 application topically 3 (three) times daily as needed for itching (minor skin irritation).   insulin aspart 100 UNIT/ML FlexPen Commonly known as: NovoLOG FlexPen Inject 15 Units into the skin 3 (three) times daily with meals. What changed: how much to take   insulin glargine 100 UNIT/ML injection Commonly known as: LANTUS Inject 0.6 mLs (60 Units total) into the skin at bedtime. What changed: how much to take   lip balm ointment Apply 1 application topically 2 (two) times daily.   lisinopril 20 MG tablet Commonly known as: ZESTRIL Take 1 tablet (20 mg total) by mouth daily.   menthol-cetylpyridinium 3 MG lozenge Commonly known as: CEPACOL Take 1 lozenge (3 mg total) by mouth as needed for sore throat (throat irritation / pain).   methocarbamol 500 MG tablet Commonly known as: ROBAXIN Take 2 tablets (1,000 mg total) by mouth 3 (three) times daily.   metroNIDAZOLE 500 MG tablet Commonly known as: Flagyl Take 1 tablet (500 mg total) by mouth 3 (three) times daily for 5 days.   montelukast 10  MG tablet Commonly known as: SINGULAIR Take 10 mg by mouth at bedtime.   nystatin 100000 UNIT/ML suspension Commonly known as: MYCOSTATIN Take 5 mLs (500,000 Units total) by mouth 4 (four) times daily.   nystatin cream Commonly known as: MYCOSTATIN Apply 1 application topically 2 (two) times daily.   oxybutynin 5 MG tablet Commonly known as: DITROPAN Take 5 mg by mouth 2 (two) times daily.   Oxycodone HCl 10 MG Tabs Take 1 tablet (10 mg total) by mouth every 8 (eight) hours as needed (pain).   phenol 1.4 % Liqd Commonly known as: CHLORASEPTIC Use as directed 1-2 sprays in the mouth or throat as needed for throat irritation / pain.   polyethylene glycol 17 g packet Commonly known as: MIRALAX / GLYCOLAX Take 17 g by mouth 2 (two) times daily.   promethazine 25 MG tablet Commonly known as: PHENERGAN Take 25 mg by mouth every 6 (six) hours as needed for nausea or vomiting.   saccharomyces boulardii 250 MG capsule Commonly known as: FLORASTOR Take 1 capsule (250 mg total) by mouth 2 (two) times daily.   senna-docusate 8.6-50 MG tablet Commonly known as: Senokot-S Take 1 tablet by mouth at bedtime.   sertraline 100 MG tablet Commonly known as: ZOLOFT Take 200 mg by mouth daily.   Vitamin D3 50 MCG (2000 UT) capsule Take 2,000 Units by mouth daily.      Allergies  Allergen Reactions  . Cafergot Other (See Comments)    Chest pain; ergotamine-caffiene  . Glucophage [Metformin Hcl] Anaphylaxis  . Canagliflozin Swelling and Other (See Comments)    Legs Swell invokana Legs Swell    The results of significant diagnostics from this hospitalization (including imaging, microbiology, ancillary and laboratory) are listed below for reference.    Significant Diagnostic Studies: CT ABDOMEN PELVIS WO CONTRAST  Result Date: 11/07/2019 CLINICAL DATA:  Right upper quadrant abdominal pain with fever. EXAM: CT ABDOMEN AND PELVIS WITHOUT CONTRAST TECHNIQUE: Multidetector CT imaging  of  the abdomen and pelvis was performed following the standard protocol without IV contrast. COMPARISON:  CT dated 10/30/2019 FINDINGS: Lower chest: There is a small right-sided pleural effusion. There is atelectasis at the lung bases.The heart size is borderline enlarged. The intracardiac blood pool is hypodense relative to the adjacent myocardium consistent with anemia. Hepatobiliary: The liver is enlarged with borderline hepatic steatosis. Status post cholecystectomy.There is no biliary ductal dilation. Pancreas: Normal contours without ductal dilatation. No peripancreatic fluid collection. Spleen: The spleen is enlarged measuring approximately 15 cm craniocaudad. Adrenals/Urinary Tract: --Adrenal glands: No adrenal hemorrhage. --Right kidney/ureter: No hydronephrosis or perinephric hematoma. --Left kidney/ureter: No hydronephrosis or perinephric hematoma. --Urinary bladder: Bladder is underdistended, however there is diffuse bladder wall thickening. Stomach/Bowel: --Stomach/Duodenum: No hiatal hernia or other gastric abnormality. Normal duodenal course and caliber. --Small bowel: There is a matted appearance of multiple small bowel loops in the lobe midline abdomen. Evaluation is limited by lack of IV and oral contrast. Developing fistula in this location would be difficult to exclude at this time. --Colon: Again noted are findings concerning for perforated sigmoid diverticulitis. There is moderate wall thickening of the sigmoid colon. --Appendix: Not visualized. No right lower quadrant inflammation or free fluid. Vascular/Lymphatic: Atherosclerotic calcification is present within the non-aneurysmal abdominal aorta, without hemodynamically significant stenosis. --there are multiple enlarged retroperitoneal lymph nodes, favored to be reactive in etiology. --there prominent mesenteric lymph nodes, favored to be reactive in etiology. --prominent pelvic lymph nodes are noted, favored to be reactive. Reproductive:  There is a masslike structure in the patient's left hemipelvis measuring approximately 3.8 x 2.8 cm. This is favored to represent the left ovary. Other: There is a well-formed fluid collection in the patient's pelvis measuring approximately 5.8 x 6.3 cm. This is favored to represent developing abscess. Extensive inflammatory changes are again noted in the patient's low midline abdomen/high pelvis. Two percutaneous drains are noted. The previously demonstrated fluid collection in the midline abdomen has substantially improved from prior study. There is a small residual collection along the patient's right hemipelvis measuring approximately 4.9 x 1.7 cm. Evaluation of additional collections is limited by lack of both oral and IV contrast. Musculoskeletal. No acute displaced fractures. IMPRESSION: 1. Evaluation limited by lack of oral and IV contrast. 2. New walled off fluid collection in the patient's pelvis concerning for a developing abscess. Currently, this is amenable to percutaneous drainage as clinically indicated. 3. Status post placement of 2 percutaneous drains with significant interval improvement in the previously demonstrated large abdominal abscesses. Residual inflammatory changes and small pockets of free fluid are noted in the patient's low midline abdomen/high pelvis. 4. Diffuse wall thickening of the urinary bladder, favored to be reactive. Correlation with urinalysis is recommended to help exclude cystitis. 5. Small right-sided pleural effusion. 6. Hepatomegaly with probable underlying hepatic steatosis. 7. Nonspecific splenomegaly. 8. Enlarged retroperitoneal and mesenteric lymph nodes, presumably reactive. Aortic Atherosclerosis (ICD10-I70.0). Electronically Signed   By: Constance Holster M.D.   On: 11/07/2019 21:28   CT Abdomen Pelvis Wo Contrast  Result Date: 10/30/2019 CLINICAL DATA:  Right lower quadrant abdominal pain. EXAM: CT ABDOMEN AND PELVIS WITHOUT CONTRAST TECHNIQUE: Multidetector  CT imaging of the abdomen and pelvis was performed following the standard protocol without IV contrast. COMPARISON:  March 21, 2017. FINDINGS: Lower chest: There are few small stable bibasilar pulmonary nodules.The heart size is normal. Hepatobiliary: There is decreased hepatic attenuation suggestive of hepatic steatosis. Status post cholecystectomy.There is no biliary ductal dilation. Pancreas: Normal contours without ductal dilatation. No  peripancreatic fluid collection. Spleen: No splenic laceration or hematoma. Adrenals/Urinary Tract: --Adrenal glands: No adrenal hemorrhage. --Right kidney/ureter: No hydronephrosis or perinephric hematoma. --Left kidney/ureter: No hydronephrosis or perinephric hematoma. --Urinary bladder: Unremarkable. Stomach/Bowel: --Stomach/Duodenum: No hiatal hernia or other gastric abnormality. Normal duodenal course and caliber. --Small bowel: No dilatation or inflammation. --Colon: Findings are concerning for perforated sigmoid colitis or diverticulitis. There is wall thickening of the sigmoid colon with a large adjacent abscess superiorly measuring approximately 12 x 8 by 9.5 cm. This collection appears to extend inferiorly and cross the patient's midline to an additional collection measuring approximately 5.4 x 3.4 cm. The patient's left ovary is not reliably identified and may abut these collections. --Appendix: Normal. Vascular/Lymphatic: Atherosclerotic calcification is present within the non-aneurysmal abdominal aorta, without hemodynamically significant stenosis. --there are mildly enlarged retroperitoneal lymph nodes, presumably reactive. --there are mildly enlarged mesenteric lymph nodes, presumably reactive. --there are mildly enlarged pelvic lymph nodes, presumably reactive. Reproductive: Unremarkable Other: No ascites or free air. There is a small volume of free fluid in the patient's pelvis. Musculoskeletal. No acute displaced fractures. IMPRESSION: 1. Findings are most  consistent with perforated sigmoid diverticulitis/colitis. There is a large 12 cm abscess as detailed above in addition to other complex fluid collections extending into the patient's pelvis. 2. Normal appendix in the right lower quadrant. 3. Status post cholecystectomy. Aortic Atherosclerosis (ICD10-I70.0). Electronically Signed   By: Constance Holster M.D.   On: 10/30/2019 20:07   DG CHEST PORT 1 VIEW  Result Date: 11/02/2019 CLINICAL DATA:  54 year old female with dyspnea EXAM: PORTABLE CHEST 1 VIEW COMPARISON:  Chest radiograph dated 07/03/2018. FINDINGS: Shallow inspiration. No focal consolidation, pleural effusion, pneumothorax. Mild cardiomegaly. No acute osseous pathology. IMPRESSION: 1. No acute cardiopulmonary process. 2. Stable mild cardiomegaly. Electronically Signed   By: Anner Crete M.D.   On: 11/02/2019 20:50   CT IMAGE GUIDED DRAINAGE BY PERCUTANEOUS CATHETER  Result Date: 11/09/2019 INDICATION: Diverticulitis, abdominopelvic abscesses. EXAM: CT DRAINAGE PELVIC ABSCESS Date:  11/09/2019 11/09/2019 2:23 pm Radiologist:  M. Daryll Brod, MD Guidance:  CT FLUOROSCOPY TIME:  None. MEDICATIONS: 1% lidocaine local ANESTHESIA/SEDATION: Moderate (conscious) sedation was employed during this procedure. A total of Versed 4.0 mg and Fentanyl 100 mcg was administered intravenously. Moderate Sedation Time: 12 minutes. The patient's level of consciousness and vital signs were monitored continuously by radiology nursing throughout the procedure under my direct supervision. CONTRAST:  None. COMPLICATIONS: None immediate. PROCEDURE: Informed consent was obtained from the patient following explanation of the procedure, risks, benefits and alternatives. The patient understands, agrees and consents for the procedure. All questions were addressed. A time out was performed. Maximal barrier sterile technique utilized including caps, mask, sterile gowns, sterile gloves, large sterile drape, hand hygiene, and  ChloraPrep. Previous imaging reviewed. Patient position prone. Noncontrast localization CT performed. The pelvic fluid collection in the cul-de-sac was localized and marked for a left trans gluteal approach. Under sterile conditions and local anesthesia, an 18 gauge 15 cm access needle was advanced percutaneously from a left trans gluteal approach to the fluid collection. Needle position confirmed with CT. Syringe aspiration yielded purulent fluid. Sample sent for culture. Guidewire inserted followed by tract dilatation to insert a 10 [place] drain. Drain catheter position confirmed with CT. Syringe aspiration yielded 30 cc purulent fluid. Catheter secured with Prolene suture and connected to external suction bulb. Sterile dressing applied. No immediate complication. Patient tolerated the procedure well. IMPRESSION: Successful CT-guided trans gluteal pelvic abscess drain placement as above. Electronically Signed  By: Eugenie Filler M.D.   On: 11/09/2019 14:47   CT IMAGE GUIDED DRAINAGE BY PERCUTANEOUS CATHETER  Result Date: 10/31/2019 INDICATION: 54 year old with intra-abdominal fluid collections and suspect colonic diverticular abscesses. EXAM: CT GUIDED DRAINAGE OF INTRA-ABDOMINAL ABSCESS X 3 MEDICATIONS: Moderate sedation ANESTHESIA/SEDATION: 4.0 mg IV Versed 200 mcg IV Fentanyl Moderate Sedation Time:  94 minutes The patient was continuously monitored during the procedure by the interventional radiology nurse under my direct supervision. COMPLICATIONS: None immediate. TECHNIQUE: The procedure was explained to the patient. The risks and benefits of the procedure were discussed and the patient's questions were addressed. Informed consent was obtained from the patient. Patient was placed supine on the CT scanner and CT images through the lower abdomen and pelvis were obtained. PROCEDURE: Collections in the left lower abdomen were identified and targeted. Left side of the abdomen was prepped with chlorhexidine and  sterile field was created. Skin and soft tissues were anesthetized with 1% lidocaine. Using CT guidance, an 18 gauge trocar needle was directed into the most lateral component of the left lower abdominal collection. Yellow purulent fluid was aspirated. Stiff Amplatz wire was placed and the tract was dilated to accommodate a 12 [place] multipurpose drain. Approximately 25 mL of yellow purulent fluid was removed from this collection. However, this collection appears to be separate from the adjacent air-fluid collection. This catheter was sutured to skin. Attention was directed to the left lower abdominal abscess containing gas that is medial to the recently placed drain. Skin was anesthetized more medial and an 18 gauge needle was directed into the air-fluid collection using CT guidance. Brown purulent fluid was aspirated. Stiff Amplatz wire was advanced into the collection and the tract was dilated to accommodate a 10.2 [place] multipurpose drain. Approximately 60 mL of brown purulent fluid was removed from this collection. Air was also aspirated from this collection. Catheter was sutured to skin. Attention was directed to the large collection in the right lower abdomen. The right side of the abdomen was prepped and draped in sterile fashion. Skin was anesthetized with 1% lidocaine. Using CT guidance, 18 gauge trocar needle was directed into the right lower abdominal collection. Cloudy yellow fluid was removed. Stiff Amplatz wire was advanced into the collection and the tract was dilated to accommodate a 12 [place] multipurpose drain. Approximately 530 mL mL cloudy yellow fluid was removed from the right lower abdominal drain. Catheter was sutured to skin. Dressings were placed on all the drains. FINDINGS: Complex air-fluid collection in the left lower abdomen adjacent to the sigmoid colon. Large air-fluid collection in the right lower abdomen that extends to the midline. Drain #1 was placed in the lateral component of  the left lower abdominal collection. This collection may be associated with the left adnexa or ovary. Although 25 mL of purulent fluid was removed from the lateral component, this collection did not appear to be contiguous with the adjacent air-fluid collection. Therefore, Drain #2 was placed within the air-fluid collection in the left lower abdominal collection. 60 mL of brown purulent fluid was removed from this drain. Large amount of cloudy yellow fluid was removed from Drain #3 in the right lower abdomen. IMPRESSION: Successful placement of 3 drains within the lower abdominal abscess collections using CT guidance. Electronically Signed   By: Markus Daft M.D.   On: 10/31/2019 17:34   CT IMAGE GUIDED DRAINAGE BY PERCUTANEOUS CATHETER  Result Date: 10/31/2019 INDICATION: 54 year old with intra-abdominal fluid collections and suspect colonic diverticular abscesses. EXAM: CT GUIDED DRAINAGE  OF INTRA-ABDOMINAL ABSCESS X 3 MEDICATIONS: Moderate sedation ANESTHESIA/SEDATION: 4.0 mg IV Versed 200 mcg IV Fentanyl Moderate Sedation Time:  94 minutes The patient was continuously monitored during the procedure by the interventional radiology nurse under my direct supervision. COMPLICATIONS: None immediate. TECHNIQUE: The procedure was explained to the patient. The risks and benefits of the procedure were discussed and the patient's questions were addressed. Informed consent was obtained from the patient. Patient was placed supine on the CT scanner and CT images through the lower abdomen and pelvis were obtained. PROCEDURE: Collections in the left lower abdomen were identified and targeted. Left side of the abdomen was prepped with chlorhexidine and sterile field was created. Skin and soft tissues were anesthetized with 1% lidocaine. Using CT guidance, an 18 gauge trocar needle was directed into the most lateral component of the left lower abdominal collection. Yellow purulent fluid was aspirated. Stiff Amplatz wire was  placed and the tract was dilated to accommodate a 12 [place] multipurpose drain. Approximately 25 mL of yellow purulent fluid was removed from this collection. However, this collection appears to be separate from the adjacent air-fluid collection. This catheter was sutured to skin. Attention was directed to the left lower abdominal abscess containing gas that is medial to the recently placed drain. Skin was anesthetized more medial and an 18 gauge needle was directed into the air-fluid collection using CT guidance. Brown purulent fluid was aspirated. Stiff Amplatz wire was advanced into the collection and the tract was dilated to accommodate a 10.2 [place] multipurpose drain. Approximately 60 mL of brown purulent fluid was removed from this collection. Air was also aspirated from this collection. Catheter was sutured to skin. Attention was directed to the large collection in the right lower abdomen. The right side of the abdomen was prepped and draped in sterile fashion. Skin was anesthetized with 1% lidocaine. Using CT guidance, 18 gauge trocar needle was directed into the right lower abdominal collection. Cloudy yellow fluid was removed. Stiff Amplatz wire was advanced into the collection and the tract was dilated to accommodate a 12 [place] multipurpose drain. Approximately 530 mL mL cloudy yellow fluid was removed from the right lower abdominal drain. Catheter was sutured to skin. Dressings were placed on all the drains. FINDINGS: Complex air-fluid collection in the left lower abdomen adjacent to the sigmoid colon. Large air-fluid collection in the right lower abdomen that extends to the midline. Drain #1 was placed in the lateral component of the left lower abdominal collection. This collection may be associated with the left adnexa or ovary. Although 25 mL of purulent fluid was removed from the lateral component, this collection did not appear to be contiguous with the adjacent air-fluid collection. Therefore,  Drain #2 was placed within the air-fluid collection in the left lower abdominal collection. 60 mL of brown purulent fluid was removed from this drain. Large amount of cloudy yellow fluid was removed from Drain #3 in the right lower abdomen. IMPRESSION: Successful placement of 3 drains within the lower abdominal abscess collections using CT guidance. Electronically Signed   By: Markus Daft M.D.   On: 10/31/2019 17:34   CT IMAGE GUIDED DRAINAGE BY PERCUTANEOUS CATHETER  Result Date: 10/31/2019 INDICATION: 54 year old with intra-abdominal fluid collections and suspect colonic diverticular abscesses. EXAM: CT GUIDED DRAINAGE OF INTRA-ABDOMINAL ABSCESS X 3 MEDICATIONS: Moderate sedation ANESTHESIA/SEDATION: 4.0 mg IV Versed 200 mcg IV Fentanyl Moderate Sedation Time:  94 minutes The patient was continuously monitored during the procedure by the interventional radiology nurse under my  direct supervision. COMPLICATIONS: None immediate. TECHNIQUE: The procedure was explained to the patient. The risks and benefits of the procedure were discussed and the patient's questions were addressed. Informed consent was obtained from the patient. Patient was placed supine on the CT scanner and CT images through the lower abdomen and pelvis were obtained. PROCEDURE: Collections in the left lower abdomen were identified and targeted. Left side of the abdomen was prepped with chlorhexidine and sterile field was created. Skin and soft tissues were anesthetized with 1% lidocaine. Using CT guidance, an 18 gauge trocar needle was directed into the most lateral component of the left lower abdominal collection. Yellow purulent fluid was aspirated. Stiff Amplatz wire was placed and the tract was dilated to accommodate a 12 [place] multipurpose drain. Approximately 25 mL of yellow purulent fluid was removed from this collection. However, this collection appears to be separate from the adjacent air-fluid collection. This catheter was sutured to  skin. Attention was directed to the left lower abdominal abscess containing gas that is medial to the recently placed drain. Skin was anesthetized more medial and an 18 gauge needle was directed into the air-fluid collection using CT guidance. Brown purulent fluid was aspirated. Stiff Amplatz wire was advanced into the collection and the tract was dilated to accommodate a 10.2 [place] multipurpose drain. Approximately 60 mL of brown purulent fluid was removed from this collection. Air was also aspirated from this collection. Catheter was sutured to skin. Attention was directed to the large collection in the right lower abdomen. The right side of the abdomen was prepped and draped in sterile fashion. Skin was anesthetized with 1% lidocaine. Using CT guidance, 18 gauge trocar needle was directed into the right lower abdominal collection. Cloudy yellow fluid was removed. Stiff Amplatz wire was advanced into the collection and the tract was dilated to accommodate a 12 [place] multipurpose drain. Approximately 530 mL mL cloudy yellow fluid was removed from the right lower abdominal drain. Catheter was sutured to skin. Dressings were placed on all the drains. FINDINGS: Complex air-fluid collection in the left lower abdomen adjacent to the sigmoid colon. Large air-fluid collection in the right lower abdomen that extends to the midline. Drain #1 was placed in the lateral component of the left lower abdominal collection. This collection may be associated with the left adnexa or ovary. Although 25 mL of purulent fluid was removed from the lateral component, this collection did not appear to be contiguous with the adjacent air-fluid collection. Therefore, Drain #2 was placed within the air-fluid collection in the left lower abdominal collection. 60 mL of brown purulent fluid was removed from this drain. Large amount of cloudy yellow fluid was removed from Drain #3 in the right lower abdomen. IMPRESSION: Successful placement of 3  drains within the lower abdominal abscess collections using CT guidance. Electronically Signed   By: Markus Daft M.D.   On: 10/31/2019 17:34    Microbiology: Recent Results (from the past 240 hour(s))  Aerobic/Anaerobic Culture (surgical/deep wound)     Status: None (Preliminary result)   Collection Time: 11/09/19  1:55 PM   Specimen: Abscess  Result Value Ref Range Status   Specimen Description ABSCESS PELVIC  Final   Special Requests Normal  Final   Gram Stain   Final    ABUNDANT WBC PRESENT, PREDOMINANTLY PMN MODERATE GRAM POSITIVE COCCI    Culture   Final    FEW ESCHERICHIA COLI CULTURE REINCUBATED FOR BETTER GROWTH HOLDING FOR POSSIBLE ANAEROBE    Report Status PENDING  Incomplete   Organism ID, Bacteria ESCHERICHIA COLI  Final      Susceptibility   Escherichia coli - MIC*    AMPICILLIN >=32 RESISTANT Resistant     CEFAZOLIN <=4 SENSITIVE Sensitive     CEFEPIME <=0.12 SENSITIVE Sensitive     CEFTAZIDIME <=1 SENSITIVE Sensitive     CEFTRIAXONE <=0.25 SENSITIVE Sensitive     CIPROFLOXACIN >=4 RESISTANT Resistant     GENTAMICIN >=16 RESISTANT Resistant     IMIPENEM <=0.25 SENSITIVE Sensitive     TRIMETH/SULFA >=320 RESISTANT Resistant     AMPICILLIN/SULBACTAM >=32 RESISTANT Resistant     PIP/TAZO Value in next row Sensitive      <=4 SENSITIVEPerformed at [facility] [address] [place], [place] [address]    * FEW ESCHERICHIA COLI  SARS CORONAVIRUS 2 (TAT 6-24 HRS) Nasopharyngeal Nasopharyngeal Swab     Status: None   Collection Time: 11/11/19  5:44 PM   Specimen: Nasopharyngeal Swab  Result Value Ref Range Status   SARS Coronavirus 2 NEGATIVE NEGATIVE Final    Comment: (NOTE) SARS-CoV-2 target nucleic acids are NOT DETECTED. The SARS-CoV-2 RNA is generally detectable in upper and lower respiratory specimens during the acute phase of infection. Negative results do not preclude SARS-CoV-2 infection, do not rule out co-infections with other pathogens, and  should not be used as the sole basis for treatment or other patient management decisions. Negative results must be combined with clinical observations, patient history, and epidemiological information. The expected result is Negative. Fact Sheet for Patients: SugarRoll.be Fact Sheet for Healthcare Providers: https://www.woods-mathews.com/ This test is not yet approved or cleared by the [place] FDA and  has been authorized for detection and/or diagnosis of SARS-CoV-2 by FDA under an Emergency Use Authorization (EUA). This EUA will remain  in effect (meaning this test can be used) for the duration of the COVID-19 declaration under Section 56 4(b)(1) of the Act, 21 U.S.C. section 360bbb-3(b)(1), unless the authorization is terminated or revoked sooner. Performed at [facility], [place] [address] [place], [place] [address]      Labs: Basic Metabolic Panel: Recent Labs  Lab 11/06/19 0816 11/07/19 0550 11/10/19 0448 11/12/19 0528  NA 136  --  138 139  K 4.4  --  4.2 4.3  CL 104  --  103 103  CO2 26  --  27 28  GLUCOSE 129*  --  118* 88  BUN 14  --  14 17  CREATININE 0.90  --  0.88 1.01*  CALCIUM 8.4*  --  8.7* 8.7*  MG 1.7 1.7 1.8 1.9  PHOS 3.2 3.2  --   --    Liver Function Tests: No results for input(s): AST, ALT, ALKPHOS, BILITOT, PROT, ALBUMIN in the last 168 hours. No results for input(s): LIPASE, AMYLASE in the last 168 hours. No results for input(s): AMMONIA in the last 168 hours. CBC: Recent Labs  Lab 11/08/19 0509 11/09/19 0459 11/10/19 0448 11/11/19 0555 11/12/19 0528  WBC 14.6* 16.0* 14.0* 10.7* 8.6  HGB 8.9* 9.7* 9.3* 8.8* 8.8*  HCT 30.2* 32.8* 31.5* 30.4* 30.9*  MCV 87.0 88.4 88.0 88.4 88.3  PLT 343 299 301 305 286   Cardiac Enzymes: No results for input(s): CKTOTAL, CKMB, CKMBINDEX, TROPONINI in the last 168 hours. BNP: BNP (last 3 results) No results for input(s): BNP in the last 8760  hours.  ProBNP (last 3 results) No results for input(s): PROBNP in the last 8760 hours.  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  Signed:        Gisele Pack, DO [facility]  11/12/2019, 12:41 PM

## 2019-11-12 NOTE — TOC Transition Note (Addendum)
Transition of Care [facility]) - CM/SW Discharge Note   Patient Details  Name: Kristen [name] MRN: [id number] Date of Birth: 1965-12-08  Transition of Care [facility]) CM/SW Contact:  Trish Mage, LCSW Phone Number: 11/12/2019, 12:40 PM   Clinical Narrative:   Patient to discharge to [name] today.  Auth for transportation is [phone].  Auth for 5 days of SNF stay is [phone]. PASSR # in updated FL2.  PTAR arranged.  Nursing, please call report to [phone], room 1A.  TOC sign off.    Final next level of care: Skilled Nursing Facility Barriers to Discharge: No Barriers Identified   Patient Goals and CMS Choice Patient states their goals for this hospitalization and ongoing recovery are:: "I don't think my sister is able to care for me at home." CMS Medicare.gov Compare Post Acute Care list provided to:: Patient Choice offered to / list presented to : Patient  Discharge Placement                       Discharge Plan and Services   Discharge Planning Services: CM Consult Post Acute Care Choice: [place]                               Social Determinants of Health (SDOH) Interventions     Readmission Risk Interventions No flowsheet data found.

## 2019-11-12 NOTE — [place] FL2 (Signed)
[place] MEDICAID FL2 LEVEL OF CARE SCREENING TOOL     IDENTIFICATION  Patient Name: Kristen [name] Birthdate: 1966-03-04 Sex: female Admission Date (Current Location): 10/30/2019  [facility] and [place] Number:  Herbalist and Address:  [facility],  [place] [address] [place]      Provider Number: [id number]  Attending Physician Name and Address:  Karie Kirks, DO  Relative Name and Phone Number:       Current Level of Care: Hospital Recommended Level of Care: [place] Prior Approval Number:    Date Approved/Denied:   PASRR Number: [id number]  Discharge Plan: SNF    Current Diagnoses: Patient Active Problem List   Diagnosis Date Noted  . 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 Diverticulitis of large intestine with abscess 10/31/2019  . Diverticulitis 10/30/2019  . Abscess of sigmoid colon 10/30/2019  . Diabetic nephropathy ([place]) 10/30/2019  . 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 LBP (low back pain) 10/30/2019  . Degeneration of intervertebral disc of lumbosacral region 10/30/2019  . Atypical migraine 10/30/2019  . 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 History of pulmonary embolus (PE) 10/30/2019  . AKI (acute kidney injury) ([place]) 10/30/2019  . Hyponatremia 10/30/2019  . Hyperglycemia 10/30/2019  . Hyperkalemia 04/27/2019  . Poor vision 04/27/2019  . Left knee pain 01/06/2019  . CKD (chronic kidney disease), stage III 04/20/2018  . Right shoulder tendonitis 03/30/2018  . Myofascial muscle pain 05/21/2017  . Pulmonary embolism ([place]) 03/21/2017  . Pulmonary emboli ([place]) 03/21/2017  . Cellulitis 12/22/2016  . Cellulitis of left lower extremity   . Reactive depression 11/27/2016  . Spondylosis of lumbar region without myelopathy or radiculopathy 07/01/2016  . Lumbar facet arthropathy 07/01/2016  . Dizziness 06/15/2015  . Morbid  obesity with BMI of 50.0-59.9, adult ([place]) 06/15/2015  . Type 2 diabetes mellitus with stage 3 chronic kidney disease ([place]) 06/15/2015  . Hyperlipidemia 06/15/2015  . Chronic pain syndrome 06/15/2015  . Amaurosis fugax 11/03/2012  . Orthostatic hypotension 10/30/2012  . Headache(784.0) 10/30/2012  . 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 Hypercholesteremia 10/01/2012  . 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 Essential hypertension 06/18/2012  . Asthma 06/18/2012    Orientation RESPIRATION BLADDER Height & Weight     Self, Time, Situation, Place  O2(1 L nasal cannula) External catheter Weight: (!) 175.8 kg Height:  5\' 8"  (172.7 cm)  BEHAVIORAL SYMPTOMS/MOOD NEUROLOGICAL BOWEL NUTRITION STATUS  (none) (none) Incontinent Diet(see d/c report)  AMBULATORY STATUS COMMUNICATION OF NEEDS Skin   Extensive Assist Verbally Other (Comment)(3 abdominal drains)                       Personal Care Assistance Level of Assistance  Bathing, Feeding, Dressing Bathing Assistance: Maximum assistance Feeding assistance: Independent Dressing Assistance: Limited assistance     Functional Limitations Info  Sight, Hearing, Speech Sight Info: Adequate(Blind in left eye, compensation fully with R eye) Hearing Info: Adequate Speech Info: Adequate    SPECIAL CARE FACTORS FREQUENCY  PT (By licensed PT)     PT Frequency: 5X/W              Contractures Contractures Info: Not present    Additional Factors Info  Code Status, Allergies Code Status Info: full Allergies Info: Cafergot, Metformin, Canagliflozin           Current Medications (11/12/2019):  This is  the current hospital active medication list Current Facility-Administered Medications  Medication Dose Route Frequency Provider Last Rate Last Admin  . 0.9 %  sodium chloride infusion  250 mL Intravenous PRN Michael Boston, MD      . acetaminophen (TYLENOL) tablet 1,000 mg  1,000 mg Oral Q6H Saverio Danker, PA-C    1,000 mg at 11/12/19 0539  . albuterol (PROVENTIL) (2.5 MG/3ML) 0.083% nebulizer solution 3 mL  3 mL Nebulization TID PRN Lucky Cowboy, MD      . alum & mag hydroxide-simeth (MAALOX/MYLANTA) 200-200-20 MG/5ML suspension 30 mL  30 mL Oral Q6H PRN Michael Boston, MD      . amoxicillin-clavulanate (AUGMENTIN) 875-125 MG per tablet 1 tablet  1 tablet Oral Q12H Saverio Danker, PA-C   1 tablet at 11/12/19 1052  . apixaban (ELIQUIS) tablet 2.5 mg  2.5 mg Oral BID Georgette Shell, MD   2.5 mg at 11/12/19 0941  . atorvastatin (LIPITOR) tablet 40 mg  40 mg Oral Daily Lucky Cowboy, MD   40 mg at 11/12/19 0941  . bisacodyl (DULCOLAX) suppository 10 mg  10 mg Rectal Q12H PRN Michael Boston, MD      . cholecalciferol (VITAMIN D) tablet 2,000 Units  2,000 Units Oral Daily Lucky Cowboy, MD   2,000 Units at 11/12/19 [phone]  . feeding supplement (PRO-STAT SUGAR FREE 64) liquid 30 mL  30 mL Oral BID Georgette Shell, MD   30 mL at 11/12/19 0940  . guaiFENesin-dextromethorphan (ROBITUSSIN DM) 100-10 MG/5ML syrup 10 mL  10 mL Oral Q4H PRN Michael Boston, MD   10 mL at 11/06/19 2231  . hydrocortisone (ANUSOL-HC) 2.5 % rectal cream 1 application  1 application Topical QID PRN Michael Boston, MD      . hydrocortisone cream 1 % 1 application  1 application Topical TID PRN Michael Boston, MD      . HYDROmorphone (DILAUDID) injection 0.5-1 mg  0.5-1 mg Intravenous Q2H PRN Saverio Danker, PA-C   1 mg at 11/12/19 1001  . insulin aspart (novoLOG) injection 0-20 Units  0-20 Units Subcutaneous Q4H Lucky Cowboy, MD   3 Units at 11/12/19 0057  . insulin aspart (novoLOG) injection 10 Units  10 Units Subcutaneous TID WC Arma Heading, MD   10 Units at 11/12/19 0940  . insulin glargine (LANTUS) injection 60 Units  60 Units Subcutaneous QHS Lucky Cowboy, MD   60 Units at 11/11/19 2138  . lip balm (CARMEX) ointment 1 application  1 application Topical BID Michael Boston, MD   1 application at 16/60/63 [phone]  . magic mouthwash   15 mL Oral QID PRN Michael Boston, MD      . menthol-cetylpyridinium (CEPACOL) lozenge 3 mg  1 lozenge Oral PRN Michael Boston, MD      . methocarbamol (ROBAXIN) tablet 1,000 mg  1,000 mg Oral TID Saverio Danker, PA-C   1,000 mg at 11/12/19 0939  . mometasone-formoterol (DULERA) 200-5 MCG/ACT inhaler 2 puff  2 puff Inhalation BID Lucky Cowboy, MD   2 puff at 11/12/19 [phone]  . montelukast (SINGULAIR) tablet 10 mg  10 mg Oral QHS Lucky Cowboy, MD   10 mg at 11/11/19 2139  . nystatin (MYCOSTATIN) 100000 UNIT/ML suspension 500,000 Units  5 mL Oral QID Georgette Shell, MD   500,000 Units at 11/12/19 0940  . ondansetron (ZOFRAN) injection 4 mg  4 mg Intravenous Q4H PRN Georgette Shell, MD   4 mg at 11/12/19 1052  .  oxyCODONE (Oxy IR/ROXICODONE) immediate release tablet 5-10 mg  5-10 mg Oral Q4H PRN Earnstine Regal, PA-C   10 mg at 11/09/19 2229  . phenol (CHLORASEPTIC) mouth spray 1-2 spray  1-2 spray Mouth/Throat PRN Michael Boston, MD      . polyethylene glycol (MIRALAX / GLYCOLAX) packet 17 g  17 g Oral BID Romana Juniper A, MD   17 g at 11/12/19 0940  . protein supplement (ENSURE MAX) liquid  11 oz Oral BID Georgette Shell, MD   11 oz at 11/12/19 [phone]  . saccharomyces boulardii (FLORASTOR) capsule 250 mg  250 mg Oral BID Earnstine Regal, PA-C   250 mg at 11/12/19 9021  . sertraline (ZOLOFT) tablet 200 mg  200 mg Oral Daily Lucky Cowboy, MD   200 mg at 11/12/19 0941  . sodium chloride flush (NS) 0.9 % injection 3 mL  3 mL Intravenous Q12H Lucky Cowboy, MD   3 mL at 11/12/19 0943  . sodium chloride flush (NS) 0.9 % injection 3 mL  3 mL Intravenous Gorden Harms, MD   3 mL at 11/12/19 0942  . sodium chloride flush (NS) 0.9 % injection 3 mL  3 mL Intravenous PRN Michael Boston, MD      . sodium chloride flush (NS) 0.9 % injection 5 mL  5 mL Intracatheter Q8H Markus Daft, MD   5 mL at 11/12/19 0541  . sodium chloride flush (NS) 0.9 % injection 5 mL  5 mL Intracatheter Q8H Markus Daft, MD   5 mL at 11/12/19 0541  . sodium chloride flush (NS) 0.9 % injection 5 mL  5 mL Intracatheter Q8H Greggory Keen, MD   5 mL at 11/12/19 0540     Discharge Medications: Please see discharge summary for a list of discharge medications.  Relevant Imaging Results:  Relevant Lab Results:   Additional Information [place], [place]

## 2019-11-14 LAB — AEROBIC/ANAEROBIC CULTURE W GRAM STAIN (SURGICAL/DEEP WOUND): Special Requests: NORMAL

## 2019-11-15 ENCOUNTER — Other Ambulatory Visit (HOSPITAL_COMMUNITY): Payer: Self-pay | Admitting: Student

## 2019-11-15 ENCOUNTER — Other Ambulatory Visit: Payer: Self-pay | Admitting: General Surgery

## 2019-11-15 DIAGNOSIS — E1365 Other specified diabetes mellitus with hyperglycemia: Secondary | ICD-10-CM | POA: Diagnosis not present

## 2019-11-15 DIAGNOSIS — E1322 Other specified diabetes mellitus with diabetic chronic kidney disease: Secondary | ICD-10-CM | POA: Diagnosis not present

## 2019-11-15 DIAGNOSIS — F331 Major depressive disorder, recurrent, moderate: Secondary | ICD-10-CM | POA: Diagnosis not present

## 2019-11-15 DIAGNOSIS — K651 Peritoneal abscess: Secondary | ICD-10-CM

## 2019-11-16 ENCOUNTER — Ambulatory Visit: Payer: PPO

## 2019-11-16 DIAGNOSIS — E7800 Pure hypercholesterolemia, unspecified: Secondary | ICD-10-CM | POA: Diagnosis not present

## 2019-11-16 DIAGNOSIS — E1122 Type 2 diabetes mellitus with diabetic chronic kidney disease: Secondary | ICD-10-CM | POA: Diagnosis not present

## 2019-11-16 DIAGNOSIS — F324 Major depressive disorder, single episode, in partial remission: Secondary | ICD-10-CM | POA: Diagnosis not present

## 2019-11-16 DIAGNOSIS — J45909 Unspecified asthma, uncomplicated: Secondary | ICD-10-CM | POA: Diagnosis not present

## 2019-11-16 DIAGNOSIS — I10 Essential (primary) hypertension: Secondary | ICD-10-CM | POA: Diagnosis not present

## 2019-11-16 DIAGNOSIS — E1165 Type 2 diabetes mellitus with hyperglycemia: Secondary | ICD-10-CM | POA: Diagnosis not present

## 2019-11-23 DIAGNOSIS — K5720 Diverticulitis of large intestine with perforation and abscess without bleeding: Secondary | ICD-10-CM | POA: Diagnosis not present

## 2019-11-23 DIAGNOSIS — E1322 Other specified diabetes mellitus with diabetic chronic kidney disease: Secondary | ICD-10-CM | POA: Diagnosis not present

## 2019-11-23 DIAGNOSIS — N739 Female pelvic inflammatory disease, unspecified: Secondary | ICD-10-CM | POA: Diagnosis not present

## 2019-11-23 DIAGNOSIS — K651 Peritoneal abscess: Secondary | ICD-10-CM | POA: Diagnosis not present

## 2019-11-24 ENCOUNTER — Inpatient Hospital Stay: Admission: RE | Admit: 2019-11-24 | Payer: PPO | Source: Ambulatory Visit

## 2019-11-24 ENCOUNTER — Other Ambulatory Visit: Payer: PPO

## 2019-12-01 DIAGNOSIS — E1322 Other specified diabetes mellitus with diabetic chronic kidney disease: Secondary | ICD-10-CM | POA: Diagnosis not present

## 2019-12-01 DIAGNOSIS — N1831 Chronic kidney disease, stage 3a: Secondary | ICD-10-CM | POA: Diagnosis not present

## 2019-12-01 DIAGNOSIS — F331 Major depressive disorder, recurrent, moderate: Secondary | ICD-10-CM | POA: Diagnosis not present

## 2019-12-04 DIAGNOSIS — G4730 Sleep apnea, unspecified: Secondary | ICD-10-CM | POA: Diagnosis not present

## 2019-12-04 DIAGNOSIS — E1122 Type 2 diabetes mellitus with diabetic chronic kidney disease: Secondary | ICD-10-CM | POA: Diagnosis not present

## 2019-12-04 DIAGNOSIS — Z6841 Body Mass Index (BMI) 40.0 and over, adult: Secondary | ICD-10-CM | POA: Diagnosis not present

## 2019-12-04 DIAGNOSIS — Z794 Long term (current) use of insulin: Secondary | ICD-10-CM | POA: Diagnosis not present

## 2019-12-04 DIAGNOSIS — E1165 Type 2 diabetes mellitus with hyperglycemia: Secondary | ICD-10-CM | POA: Diagnosis not present

## 2019-12-04 DIAGNOSIS — Z7901 Long term (current) use of anticoagulants: Secondary | ICD-10-CM | POA: Diagnosis not present

## 2019-12-04 DIAGNOSIS — G894 Chronic pain syndrome: Secondary | ICD-10-CM | POA: Diagnosis not present

## 2019-12-04 DIAGNOSIS — M5136 Other intervertebral disc degeneration, lumbar region: Secondary | ICD-10-CM | POA: Diagnosis not present

## 2019-12-04 DIAGNOSIS — F411 Generalized anxiety disorder: Secondary | ICD-10-CM | POA: Diagnosis not present

## 2019-12-04 DIAGNOSIS — Z8616 Personal history of COVID-19: Secondary | ICD-10-CM | POA: Diagnosis not present

## 2019-12-04 DIAGNOSIS — K5720 Diverticulitis of large intestine with perforation and abscess without bleeding: Secondary | ICD-10-CM | POA: Diagnosis not present

## 2019-12-04 DIAGNOSIS — F331 Major depressive disorder, recurrent, moderate: Secondary | ICD-10-CM | POA: Diagnosis not present

## 2019-12-04 DIAGNOSIS — J45909 Unspecified asthma, uncomplicated: Secondary | ICD-10-CM | POA: Diagnosis not present

## 2019-12-04 DIAGNOSIS — N1831 Chronic kidney disease, stage 3a: Secondary | ICD-10-CM | POA: Diagnosis not present

## 2019-12-04 DIAGNOSIS — I129 Hypertensive chronic kidney disease with stage 1 through stage 4 chronic kidney disease, or unspecified chronic kidney disease: Secondary | ICD-10-CM | POA: Diagnosis not present

## 2019-12-04 DIAGNOSIS — Z87440 Personal history of urinary (tract) infections: Secondary | ICD-10-CM | POA: Diagnosis not present

## 2019-12-04 DIAGNOSIS — Z86711 Personal history of pulmonary embolism: Secondary | ICD-10-CM | POA: Diagnosis not present

## 2019-12-07 ENCOUNTER — Other Ambulatory Visit: Payer: PPO

## 2019-12-14 ENCOUNTER — Other Ambulatory Visit: Payer: PPO

## 2019-12-14 ENCOUNTER — Ambulatory Visit: Payer: PPO | Admitting: Registered Nurse

## 2019-12-14 ENCOUNTER — Ambulatory Visit
Admission: RE | Admit: 2019-12-14 | Discharge: 2019-12-14 | Disposition: A | Discharge status: Home / Self Care | Payer: PPO | Source: Ambulatory Visit | Attending: General Surgery | Admitting: General Surgery

## 2019-12-14 ENCOUNTER — Encounter: Payer: Self-pay | Admitting: *Deleted

## 2019-12-14 ENCOUNTER — Ambulatory Visit
Admission: RE | Admit: 2019-12-14 | Discharge: 2019-12-14 | Disposition: A | Discharge status: Home / Self Care | Payer: PPO | Source: Ambulatory Visit | Attending: Student | Admitting: Student

## 2019-12-14 DIAGNOSIS — K651 Peritoneal abscess: Secondary | ICD-10-CM

## 2019-12-14 DIAGNOSIS — K5792 Diverticulitis of intestine, part unspecified, without perforation or abscess without bleeding: Secondary | ICD-10-CM | POA: Diagnosis not present

## 2019-12-14 DIAGNOSIS — K5732 Diverticulitis of large intestine without perforation or abscess without bleeding: Secondary | ICD-10-CM | POA: Diagnosis not present

## 2019-12-14 HISTORY — PX: IR RADIOLOGIST EVAL & MGMT: IMG5224

## 2019-12-14 NOTE — Progress Notes (Signed)
Referring Physician(s): Dr Joyice Faster  Chief Complaint: The patient is seen in follow up today s/p diverticular abscess drains placed 1/10 and 11/09/19   History of present illness:  Multiple intra-abdominal fluid collections s/p RLQ and two LLQ drain placements in IR 10/31/2019 by Dr. Anselm Pancoast; s/p removal of one of the LLQ drains 03/06/16; complicated by development of additional abdominopelvic fluid collection s/p left TG drain placement in IR 11/09/2019 by Dr. Annamaria Boots (patient has a total of 3 IR drains at this time- RLQ, mid abdominal, and left TG).  Here today for re CT and follow up  Pt states very little OP from all 3 drains Flushes 5 cc each every other day Finished antibiotics several days ago Denies fever or chills  Was seen by Dr Leighton Ruff this am Was instructed to keep appt with IR Clinic today CT done Reviewed with Dr Earleen Newport Abscesses all look resolved   Past Medical History:  Diagnosis Date  . Anxiety   . Asthma   . 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 Depression   . Diabetes mellitus   . History of pulmonary embolus (PE)   . Hypertension   . Migraines   . Near syncope   . Obesity   . PCOS (polycystic ovarian syndrome)   . Peripheral edema   . Sleep apnea    uses cpap set on 10  . 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 ABDOMINAL SURGERY     hernia repair  . CHOLECYSTECTOMY  [id number]  . COLONOSCOPY WITH PROPOFOL N/A 07/31/2016   Procedure: COLONOSCOPY WITH PROPOFOL;  Surgeon: Arta Silence, MD;  Location: [facility];  Service: Endoscopy;  Laterality: N/A;  . FOOT SURGERY Right 06/2007  . KNEE ARTHROSCOPY  09/2008   left  . KNEE ARTHROSCOPY  april 2011   left  . NASAL SINUS SURGERY    . NASAL SINUS SURGERY  1990  . SHOULDER SURGERY  2004   left    Allergies: Cafergot, Glucophage [metformin hcl], and Canagliflozin  Medications: Prior to Admission medications     Medication Sig Start Date End Date Taking? Authorizing Provider  acetaminophen (TYLENOL) 500 MG tablet Take 1,000 mg by mouth every 6 (six) hours as needed for moderate pain.    [provider]  albuterol (ACCUNEB) 1.25 MG/3ML nebulizer solution Inhale 1 ampule into the lungs 3 (three) times daily as needed for wheezing or shortness of breath.  12/12/10   [provider]  albuterol (VENTOLIN HFA) 108 (90 Base) MCG/ACT inhaler Inhale 2 puffs into the lungs every 4 (four) hours as needed for wheezing or shortness of breath.  01/04/14   [provider]  Amino Acids-Protein Hydrolys (FEEDING SUPPLEMENT, PRO-STAT SUGAR FREE 64,) LIQD Take 30 mLs by mouth 2 (two) times daily. 11/12/19   Swayze, Ava, DO  apixaban (ELIQUIS) 2.5 MG TABS tablet Take 1 tablet (2.5 mg total) by mouth 2 (two) times daily. 11/12/19   Swayze, Ava, DO  aspirin EC 81 MG tablet Take 81 mg by mouth daily.    [provider]  atorvastatin (LIPITOR) 40 MG tablet Take 40 mg by mouth daily.    [provider]  buPROPion (WELLBUTRIN XL) 150 MG 24 hr tablet Take 150 mg by mouth every morning. 09/13/19   [provider]  Cholecalciferol (VITAMIN D3) 2000 units capsule Take 2,000 Units by mouth daily.  [provider]  Ensure Max Protein (ENSURE MAX PROTEIN) LIQD Take 330 mLs (11 oz total) by mouth 2 (two) times daily. 11/12/19   Swayze, Ava, DO  Fluticasone-Salmeterol (ADVAIR) 500-50 MCG/DOSE AEPB Inhale 1 puff into the lungs every 12 (twelve) hours.    [provider]  hydrocortisone (ANUSOL-HC) 2.5 % rectal cream Apply 1 application topically 4 (four) times daily as needed for hemorrhoids. 11/12/19   Swayze, Ava, DO  hydrocortisone cream 1 % Apply 1 application topically 3 (three) times daily as needed for itching (minor skin irritation). 11/12/19   Swayze, Ava, DO  insulin aspart (NOVOLOG FLEXPEN) 100 UNIT/ML FlexPen Inject 15 Units into the skin 3 (three) times daily with  meals. Patient taking differently: Inject 44 Units into the skin 3 (three) times daily with meals.  03/24/17   Florencia Reasons, MD  insulin glargine (LANTUS) 100 UNIT/ML injection Inject 0.6 mLs (60 Units total) into the skin at bedtime. 11/12/19   Swayze, Ava, DO  lip balm (CARMEX) ointment Apply 1 application topically 2 (two) times daily. 11/12/19   Swayze, Ava, DO  lisinopril (PRINIVIL,ZESTRIL) 20 MG tablet Take 1 tablet (20 mg total) by mouth daily. 03/24/17 10/30/19  Florencia Reasons, MD  menthol-cetylpyridinium (CEPACOL) 3 MG lozenge Take 1 lozenge (3 mg total) by mouth as needed for sore throat (throat irritation / pain). 11/12/19   Swayze, Ava, DO  methocarbamol (ROBAXIN) 500 MG tablet Take 2 tablets (1,000 mg total) by mouth 3 (three) times daily. 11/12/19   Swayze, Ava, DO  montelukast (SINGULAIR) 10 MG tablet Take 10 mg by mouth at bedtime.     [provider]  nystatin (MYCOSTATIN) 100000 UNIT/ML suspension Take 5 mLs (500,000 Units total) by mouth 4 (four) times daily. 11/12/19   Swayze, Ava, DO  nystatin cream (MYCOSTATIN) Apply 1 application topically 2 (two) times daily. Patient not taking: Reported on 10/31/2019 03/11/18   Huel Cote, NP  oxybutynin (DITROPAN) 5 MG tablet Take 5 mg by mouth 2 (two) times daily.  03/13/16   [provider]  Oxycodone HCl 10 MG TABS Take 1 tablet (10 mg total) by mouth every 8 (eight) hours as needed (pain). 11/12/19   Swayze, Ava, DO  phenol (CHLORASEPTIC) 1.4 % LIQD Use as directed 1-2 sprays in the mouth or throat as needed for throat irritation / pain. 11/12/19   Swayze, Ava, DO  polyethylene glycol (MIRALAX / GLYCOLAX) 17 g packet Take 17 g by mouth 2 (two) times daily. 11/12/19   Swayze, Ava, DO  promethazine (PHENERGAN) 25 MG tablet Take 25 mg by mouth every 6 (six) hours as needed for nausea or vomiting.     [provider]  saccharomyces boulardii (FLORASTOR) 250 MG capsule Take 1 capsule (250 mg total) by mouth 2 (two) times daily. 11/12/19    Swayze, Ava, DO  senna-docusate (SENOKOT-S) 8.6-50 MG tablet Take 1 tablet by mouth at bedtime. 03/24/17   Florencia Reasons, MD  sertraline (ZOLOFT) 100 MG tablet Take 200 mg by mouth daily.     [provider]     Family History  Problem Relation Age of Onset  . Hypertension Maternal Grandmother   . Stroke Maternal Grandmother   . Diabetes Maternal Grandfather   . Cancer Mother        lung  . Cancer Father   . Diabetes Sister   . Breast cancer Cousin   . Heart attack Neg Hx     Social History   Socioeconomic History  .  Marital status: Single    Spouse name: Not on file  . Number of children: Not on file  . Years of education: Not on file  . Highest education level: Not on file  Occupational History  . Not on file  Tobacco Use  . Smoking status: Never Smoker  . Smokeless tobacco: Never Used  Substance and Sexual Activity  . Alcohol use: No    Alcohol/week: 0.0 standard drinks  . Drug use: No  . Sexual activity: Not Currently    Comment: 1st intercourse 43 yo-1 partner  Other Topics Concern  . Not on file  Social History Narrative  . Not on file   Social Determinants of Health   Financial Resource Strain:   . Difficulty of Paying Living Expenses: Not on file  Food Insecurity:   . Worried About Charity fundraiser in the Last Year: Not on file  . Ran Out of Food in the Last Year: Not on file  Transportation Needs:   . Lack of Transportation (Medical): Not on file  . Lack of Transportation (Non-Medical): Not on file  Physical Activity:   . Days of Exercise per Week: Not on file  . Minutes of Exercise per Session: Not on file  Stress:   . Feeling of Stress : Not on file  Social Connections:   . Frequency of Communication with Friends and Family: Not on file  . Frequency of Social Gatherings with Friends and Family: Not on file  . Attends Religious Services: Not on file  . Active Member of Clubs or Organizations: Not on file  . Attends Archivist  Meetings: Not on file  . Marital Status: Not on file     Vital Signs: There were no vitals taken for this visit.  Physical Exam Skin:    General: Skin is warm and dry.     Comments: Abd abscess sites with minimal redness noted NT no bleeding Drains removed without complication with new dressings placed  TG drain site is red; with granulated tissue noted More tender to touch than other sites No pus or infection noted Drain removed without complication Dressing placed      Imaging: No results found.  Labs:  CBC: Recent Labs    11/09/19 0459 11/10/19 0448 11/11/19 0555 11/12/19 0528  WBC 16.0* 14.0* 10.7* 8.6  HGB 9.7* 9.3* 8.8* 8.8*  HCT 32.8* 31.5* 30.4* 30.9*  PLT 299 301 305 286    COAGS: Recent Labs    11/01/19 0526 11/02/19 0503 11/03/19 0618 11/04/19 0746  INR 1.4* 1.3* 1.3* 1.2    BMP: Recent Labs    11/05/19 0525 11/06/19 0816 11/10/19 0448 11/12/19 0528  NA 136 136 138 139  K 4.1 4.4 4.2 4.3  CL 104 104 103 103  CO2 23 26 27 28   GLUCOSE 135* 129* 118* 88  BUN 17 14 14 17   CALCIUM 8.5* 8.4* 8.7* 8.7*  CREATININE 0.92 0.90 0.88 1.01*  GFRNONAA >60 >60 >60 >60  GFRAA >60 >60 >60 >60    LIVER FUNCTION TESTS: Recent Labs    11/02/19 0503 11/03/19 0618 11/04/19 0746 11/05/19 0525  BILITOT 0.7 0.3 0.4 0.3  AST 54* 31 25 19   ALT 43 33 34 25  ALKPHOS 178* 201* 274* 268*  PROT 6.4* 6.1* 6.1* 6.3*  ALBUMIN 2.1* 1.9* 1.9* 2.0*    Assessment:  Diverticular abscesses CT today shows resolution of abscesses All drains removed per Dr Earleen Newport order Pt to follow with Dr Loni Muse  Thomas in 4 weeks   Signed: Lavonia Drafts, PA-C 12/14/2019, 2:34 PM   Please refer to Dr. Earleen Newport attestation of this note for management and plan.

## 2019-12-15 ENCOUNTER — Other Ambulatory Visit: Payer: Self-pay

## 2019-12-15 ENCOUNTER — Encounter: Payer: PPO | Attending: Registered Nurse | Admitting: Registered Nurse

## 2019-12-15 ENCOUNTER — Encounter: Payer: Self-pay | Admitting: Registered Nurse

## 2019-12-15 VITALS — BP 133/88 | HR 94 | Temp 97.9°F | Ht 68.0 in | Wt 364.0 lb

## 2019-12-15 DIAGNOSIS — R899 Unspecified abnormal finding in specimens from other organs, systems and tissues: Secondary | ICD-10-CM | POA: Diagnosis not present

## 2019-12-15 DIAGNOSIS — R110 Nausea: Secondary | ICD-10-CM | POA: Diagnosis not present

## 2019-12-15 DIAGNOSIS — M546 Pain in thoracic spine: Secondary | ICD-10-CM | POA: Insufficient documentation

## 2019-12-15 DIAGNOSIS — M47816 Spondylosis without myelopathy or radiculopathy, lumbar region: Secondary | ICD-10-CM | POA: Insufficient documentation

## 2019-12-15 DIAGNOSIS — Z5181 Encounter for therapeutic drug level monitoring: Secondary | ICD-10-CM | POA: Diagnosis not present

## 2019-12-15 DIAGNOSIS — K651 Peritoneal abscess: Secondary | ICD-10-CM | POA: Diagnosis not present

## 2019-12-15 DIAGNOSIS — I10 Essential (primary) hypertension: Secondary | ICD-10-CM | POA: Diagnosis not present

## 2019-12-15 DIAGNOSIS — G894 Chronic pain syndrome: Secondary | ICD-10-CM | POA: Diagnosis not present

## 2019-12-15 DIAGNOSIS — Z79891 Long term (current) use of opiate analgesic: Secondary | ICD-10-CM | POA: Insufficient documentation

## 2019-12-15 DIAGNOSIS — G8929 Other chronic pain: Secondary | ICD-10-CM | POA: Diagnosis not present

## 2019-12-15 DIAGNOSIS — E1122 Type 2 diabetes mellitus with diabetic chronic kidney disease: Secondary | ICD-10-CM | POA: Diagnosis not present

## 2019-12-15 DIAGNOSIS — Z09 Encounter for follow-up examination after completed treatment for conditions other than malignant neoplasm: Secondary | ICD-10-CM | POA: Diagnosis not present

## 2019-12-15 DIAGNOSIS — K5780 Diverticulitis of intestine, part unspecified, with perforation and abscess without bleeding: Secondary | ICD-10-CM | POA: Diagnosis not present

## 2019-12-15 MED ORDER — OXYCODONE HCL 10 MG PO TABS: 10.0000 mg | ORAL_TABLET | Freq: Three times a day (TID) | ORAL | 0 refills | Status: DC | PRN

## 2019-12-15 NOTE — Progress Notes (Signed)
Subjective:    Patient ID: Kristen [name], female    DOB: March 23, 1966, 54 y.o.   MRN: [id number]  HPI: Kristen [name] is a 54 y.o. female who returns for follow up appointment for chronic pain and medication refill. She states her pain is located in her mid- low back pain. Also reports abdominal discomfort post abdominal drains removal. She rates her pain 7. Her current exercise regime is walking.  [name] was hospitalized in [place] 11/09/2019 for Perforated Sigmoid  Diverticulitis with abscess and Discharged to [facility] on 11/12/2019 and Discharged from [facility] on 12/02/2019, she reports.  [name] Morphine equivalent is  45.00 MME.  Oral Swab Performed Today.   Pain Inventory Average Pain 8 Pain Right Now 7 My pain is sharp and dull  In the last 24 hours, has pain interfered with the following? General activity 8 Relation with others 10 Enjoyment of life 9 What TIME of day is your pain at its worst? evening and night Sleep (in general) Poor  Pain is worse with: walking, bending and standing Pain improves with: rest and medication Relief from Meds: 8  Mobility walk without assistance how many minutes can you walk? 10  ability to climb steps?  no do you drive?  yes  Function disabled: date disabled 6/13 I need assistance with the following:  household duties and shopping  Neuro/Psych bladder control problems depression anxiety  Prior Studies Any changes since last visit?  no  Physicians involved in your care Primary care . Marland Kitchen   Family History  Problem Relation Age of Onset  . Hypertension Maternal Grandmother   . Stroke Maternal Grandmother   . Diabetes Maternal Grandfather   . Cancer Mother        lung  . Cancer Father   . Diabetes Sister   . Breast cancer Cousin   . Heart attack Neg Hx    Social History   Socioeconomic History  . Marital status: Single    Spouse name: Not on file  . Number of  children: Not on file  . Years of education: Not on file  . Highest education level: Not on file  Occupational History  . Not on file  Tobacco Use  . Smoking status: Never Smoker  . Smokeless tobacco: Never Used  Substance and Sexual Activity  . Alcohol use: No    Alcohol/week: 0.0 standard drinks  . Drug use: No  . Sexual activity: Not Currently    Comment: 1st intercourse 62 yo-1 partner  Other Topics Concern  . Not on file  Social History Narrative  . Not on file   Social Determinants of Health   Financial Resource Strain:   . Difficulty of Paying Living Expenses: Not on file  Food Insecurity:   . Worried About Charity fundraiser in the Last Year: Not on file  . Ran Out of Food in the Last Year: Not on file  Transportation Needs:   . Lack of Transportation (Medical): Not on file  . Lack of Transportation (Non-Medical): Not on file  Physical Activity:   . Days of Exercise per Week: Not on file  . Minutes of Exercise per Session: Not on file  Stress:   . Feeling of Stress : Not on file  Social Connections:   . Frequency of Communication with Friends and Family: Not on file  . Frequency of Social Gatherings with Friends and Family: Not on file  . Attends Religious Services:  Not on file  . Active Member of Clubs or Organizations: Not on file  . Attends Archivist Meetings: Not on file  . Marital Status: Not on file   Past Surgical History:  Procedure Laterality Date  . ABDOMINAL SURGERY     hernia repair  . CHOLECYSTECTOMY  [id number]  . COLONOSCOPY WITH PROPOFOL N/A 07/31/2016   Procedure: COLONOSCOPY WITH PROPOFOL;  Surgeon: Arta Silence, MD;  Location: [facility];  Service: Endoscopy;  Laterality: N/A;  . FOOT SURGERY Right 06/2007  . IR RADIOLOGIST EVAL & MGMT  12/14/2019  . KNEE ARTHROSCOPY  09/2008   left  . KNEE ARTHROSCOPY  april 2011   left  . NASAL SINUS SURGERY    . NASAL SINUS SURGERY  1990  . SHOULDER SURGERY  2004   left   Past  Medical History:  Diagnosis Date  . Anxiety   . Asthma   . 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 Depression   . Diabetes mellitus   . History of pulmonary embolus (PE)   . Hypertension   . Migraines   . Near syncope   . Obesity   . PCOS (polycystic ovarian syndrome)   . Peripheral edema   . Sleep apnea    uses cpap set on 10  . Stroke ([place]) 12/2011   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    BP 133/88   Pulse 94   Temp 97.9 F (36.6 C)   Ht 5\' 8"  (1.727 m)   Wt (!) 364 lb (165.1 kg)   SpO2 91%   BMI 55.35 kg/m   Opioid Risk Score:   Fall Risk Score:  `1  Depression screen PHQ 2/9  Depression screen [facility] 2/9 03/30/2018 02/20/2018 01/26/2018 12/05/2017 12/25/2016 07/01/2016  Decreased Interest 2 0 3 3 3 2   Down, Depressed, Hopeless 2 0 3 3 3 3   PHQ - 2 Score 4 0 6 6 6 5   Altered sleeping - - 0 - - 3  Tired, decreased energy - - 2 - - 3  Change in appetite - - 0 - - 1  Feeling bad or failure about yourself  - - 3 - - 3  Trouble concentrating - - 0 - - 1  Moving slowly or fidgety/restless - - 0 - - 2  Suicidal thoughts - - 0 - - 1  PHQ-9 Score - - 11 - - 19  Difficult doing work/chores - - Very difficult - - -    Review of Systems  Constitutional: Positive for unexpected weight change.  Respiratory: Positive for cough.   Gastrointestinal: Positive for abdominal pain and nausea.  Psychiatric/Behavioral: Positive for dysphoric mood. The patient is nervous/anxious.   All other systems reviewed and are negative.      Objective:   Physical Exam Vitals and nursing note reviewed.  Constitutional:      Appearance: Normal appearance. She is obese.  Cardiovascular:     Rate and Rhythm: Normal rate and regular rhythm.     Pulses: Normal pulses.     Heart sounds: Normal heart sounds.  Pulmonary:     Effort: Pulmonary effort is normal.     Breath sounds: Normal breath sounds.  Abdominal:     Palpations: Abdomen is soft.     Comments: Bowel Sounds Sluggish Abdominal Dressing  intact  Musculoskeletal:     Cervical back: Normal range of motion and neck supple.  Neurological:     Mental Status: She is alert.  Assessment & Plan:  1.Spondylosisof Lumbar Region/ Lumbar Facet arthropathy:12/15/2019. Refilled: Oxycodone 10 mg one tablet every 8 hours as needed #90. Second script e-scribefor the following month. We will continue the opioid monitoring program, this consists of regular clinic visits, examinations, urine drug screen, pill counts as well as use of [place] Controlled Substance Reporting System.  2. Morbid obesity: Continue Healthy Diet Regime and HEP.12/15/2019. 3. Type II diabetes: Dr. Buddy Duty following.12/15/2019. 4. Reactive Depression: Continue Zoloft.12/15/2019. 5. Myofascial Muscle Pain: Continuecurrent medication regimen withSoma.12/15/2019 6.LeftChronic Knee Pain: No complaints today. Continue HEP as Tolerated. Continue to Monitor.12/15/2019  70minutes of face to face patient care time was spent during this visit. All questions were encouraged and answered.  F/U in71months

## 2019-12-21 LAB — DRUG TOX MONITOR 1 W/CONF, ORAL FLD
Amphetamines: NEGATIVE ng/mL (ref <10)
Barbiturates: NEGATIVE ng/mL (ref <10)
Benzodiazepines: NEGATIVE ng/mL (ref <0.50)
Buprenorphine: NEGATIVE ng/mL (ref <0.10)
Carisoprodol: >250 ng/mL — ABNORMAL HIGH (ref <2.5)
Cocaine: NEGATIVE ng/mL (ref <5.0)
Codeine: NEGATIVE ng/mL (ref <2.5)
Dihydrocodeine: NEGATIVE ng/mL (ref <2.5)
Fentanyl: NEGATIVE ng/mL (ref <0.10)
Heroin Metabolite: NEGATIVE ng/mL (ref <1.0)
Hydrocodone: NEGATIVE ng/mL (ref <2.5)
Hydromorphone: NEGATIVE ng/mL (ref <2.5)
MARIJUANA: NEGATIVE ng/mL (ref <2.5)
MDMA: NEGATIVE ng/mL (ref <10)
Meprobamate: >250 ng/mL — ABNORMAL HIGH (ref <2.5)
Meprobamate: POSITIVE ng/mL — AB (ref <2.5)
Methadone: NEGATIVE ng/mL (ref <5.0)
Morphine: NEGATIVE ng/mL (ref <2.5)
Nicotine Metabolite: NEGATIVE ng/mL (ref <5.0)
Norhydrocodone: NEGATIVE ng/mL (ref <2.5)
Noroxycodone: 146.6 ng/mL — ABNORMAL HIGH (ref <2.5)
Opiates: POSITIVE ng/mL — AB (ref <2.5)
Oxycodone: 203 ng/mL — ABNORMAL HIGH (ref <2.5)
Oxymorphone: NEGATIVE ng/mL (ref <2.5)
Phencyclidine: NEGATIVE ng/mL (ref <10)
Tapentadol: NEGATIVE ng/mL (ref <5.0)
Tramadol: NEGATIVE ng/mL (ref <5.0)
Zolpidem: NEGATIVE ng/mL (ref <5.0)

## 2019-12-21 LAB — DRUG TOX ALC METAB W/CON, ORAL FLD: Alcohol Metabolite: NEGATIVE ng/mL (ref <25)

## 2019-12-27 ENCOUNTER — Telehealth: Payer: Self-pay

## 2019-12-27 NOTE — Telephone Encounter (Signed)
Oral swab drug screen was consistent for prescribed medications.  ?

## 2020-01-11 DIAGNOSIS — K5792 Diverticulitis of intestine, part unspecified, without perforation or abscess without bleeding: Secondary | ICD-10-CM | POA: Diagnosis not present

## 2020-01-13 ENCOUNTER — Ambulatory Visit
Admission: RE | Admit: 2020-01-13 | Discharge: 2020-01-13 | Disposition: A | Discharge status: Home / Self Care | Payer: PPO | Source: Ambulatory Visit | Attending: Women's Health | Admitting: Women's Health

## 2020-01-13 ENCOUNTER — Other Ambulatory Visit: Payer: Self-pay

## 2020-01-13 ENCOUNTER — Other Ambulatory Visit: Payer: Self-pay | Admitting: Women's Health

## 2020-01-13 DIAGNOSIS — Z1231 Encounter for screening mammogram for malignant neoplasm of breast: Secondary | ICD-10-CM

## 2020-02-08 ENCOUNTER — Encounter: Payer: PPO | Attending: Registered Nurse | Admitting: Registered Nurse

## 2020-02-08 ENCOUNTER — Encounter: Payer: Self-pay | Admitting: Registered Nurse

## 2020-02-08 ENCOUNTER — Other Ambulatory Visit: Payer: Self-pay

## 2020-02-08 VITALS — BP 101/60 | HR 87 | Temp 97.5°F | Ht 68.0 in | Wt 363.6 lb

## 2020-02-08 DIAGNOSIS — Z5181 Encounter for therapeutic drug level monitoring: Secondary | ICD-10-CM | POA: Diagnosis not present

## 2020-02-08 DIAGNOSIS — M7918 Myalgia, other site: Secondary | ICD-10-CM

## 2020-02-08 DIAGNOSIS — G894 Chronic pain syndrome: Secondary | ICD-10-CM | POA: Diagnosis not present

## 2020-02-08 DIAGNOSIS — M47816 Spondylosis without myelopathy or radiculopathy, lumbar region: Secondary | ICD-10-CM | POA: Diagnosis not present

## 2020-02-08 DIAGNOSIS — G8929 Other chronic pain: Secondary | ICD-10-CM | POA: Diagnosis not present

## 2020-02-08 DIAGNOSIS — M25562 Pain in left knee: Secondary | ICD-10-CM

## 2020-02-08 DIAGNOSIS — Z79891 Long term (current) use of opiate analgesic: Secondary | ICD-10-CM

## 2020-02-08 DIAGNOSIS — M546 Pain in thoracic spine: Secondary | ICD-10-CM | POA: Insufficient documentation

## 2020-02-08 MED ORDER — OXYCODONE HCL 10 MG PO TABS: 10.0000 mg | ORAL_TABLET | Freq: Three times a day (TID) | ORAL | 0 refills | Status: DC | PRN

## 2020-02-08 NOTE — Progress Notes (Signed)
Subjective:    Patient ID: Kristen [name], female    DOB: May 30, 1966, 54 y.o.   MRN: [id number]  HPI: Kristen [name] is a 54 y.o. female who returns for follow up appointment for chronic pain and medication refill. She states her pain is located in her lower back and left knee pain. She rates her pain 7. Her current exercise regime is walking and performing stretching exercises using the bands she reports.   Kristen [name] Morphine equivalent is 45.00  MME.  Last Oral Swab was Performed on 12/15/2019, it was consistent.   Pain Inventory Average Pain 7 Pain Right Now 7 My pain is sharp, stabbing and aching  In the last 24 hours, has pain interfered with the following? General activity 6 Relation with others 7 Enjoyment of life 9 What TIME of day is your pain at its worst? all Sleep (in general) Poor  Pain is worse with: walking, bending and standing Pain improves with: rest, heat/ice and medication Relief from Meds: 7  Mobility walk without assistance  Function not employed: date last employed 03/31/2012  Neuro/Psych bladder control problems weakness depression anxiety  Prior Studies Any changes since last visit?  no  Physicians involved in your care Any changes since last visit?  no   Family History  Problem Relation Age of Onset  . Hypertension Maternal Grandmother   . Stroke Maternal Grandmother   . Diabetes Maternal Grandfather   . Cancer Mother        lung  . Cancer Father   . Diabetes Sister   . Breast cancer Cousin   . Heart attack Neg Hx    Social History   Socioeconomic History  . Marital status: Single    Spouse name: Not on file  . Number of children: Not on file  . Years of education: Not on file  . Highest education level: Not on file  Occupational History  . Not on file  Tobacco Use  . Smoking status: Never Smoker  . Smokeless tobacco: Never Used  Substance and Sexual Activity  . Alcohol use: No    Alcohol/week: 0.0 standard  drinks  . Drug use: No  . Sexual activity: Not Currently    Comment: 1st intercourse 67 yo-1 partner  Other Topics Concern  . Not on file  Social History Narrative  . Not on file   Social Determinants of Health   Financial Resource Strain:   . Difficulty of Paying Living Expenses:   Food Insecurity:   . Worried About Charity fundraiser in the Last Year:   . Arboriculturist in the Last Year:   Transportation Needs:   . Film/video editor (Medical):   Marland Kitchen Lack of Transportation (Non-Medical):   Physical Activity:   . Days of Exercise per Week:   . Minutes of Exercise per Session:   Stress:   . Feeling of Stress :   Social Connections:   . Frequency of Communication with Friends and Family:   . Frequency of Social Gatherings with Friends and Family:   . Attends Religious Services:   . Active Member of Clubs or Organizations:   . Attends Archivist Meetings:   Marland Kitchen Marital Status:    Past Surgical History:  Procedure Laterality Date  . ABDOMINAL SURGERY     hernia repair  . CHOLECYSTECTOMY  [id number]  . COLONOSCOPY WITH PROPOFOL N/A 07/31/2016   Procedure: COLONOSCOPY WITH PROPOFOL;  Surgeon: Arta Silence, MD;  Location: [facility];  Service: Endoscopy;  Laterality: N/A;  . FOOT SURGERY Right 06/2007  . IR RADIOLOGIST EVAL & MGMT  12/14/2019  . KNEE ARTHROSCOPY  09/2008   left  . KNEE ARTHROSCOPY  april 2011   left  . NASAL SINUS SURGERY    . NASAL SINUS SURGERY  1990  . SHOULDER SURGERY  2004   left   Past Medical History:  Diagnosis Date  . Anxiety   . Asthma   . 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 Depression   . Diabetes mellitus   . History of pulmonary embolus (PE)   . Hypertension   . Migraines   . Near syncope   . Obesity   . PCOS (polycystic ovarian syndrome)   . Peripheral edema   . Sleep apnea    uses cpap set on 10  . Stroke ([place]) 12/2011   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    BP 101/60   Pulse 87   Temp (!) 97.5 F (36.4 C)    Ht 5\' 8"  (1.727 m)   Wt (!) 363 lb 9.6 oz (164.9 kg)   SpO2 96%   BMI 55.29 kg/m   Opioid Risk Score:   Fall Risk Score:  `1  Depression screen PHQ 2/9  Depression screen [facility] 2/9 03/30/2018 02/20/2018 01/26/2018 12/05/2017 12/25/2016 07/01/2016  Decreased Interest 2 0 3 3 3 2   Down, Depressed, Hopeless 2 0 3 3 3 3   PHQ - 2 Score 4 0 6 6 6 5   Altered sleeping - - 0 - - 3  Tired, decreased energy - - 2 - - 3  Change in appetite - - 0 - - 1  Feeling bad or failure about yourself  - - 3 - - 3  Trouble concentrating - - 0 - - 1  Moving slowly or fidgety/restless - - 0 - - 2  Suicidal thoughts - - 0 - - 1  PHQ-9 Score - - 11 - - 19  Difficult doing work/chores - - Very difficult - - -   Review of Systems  Constitutional: Negative.   HENT: Negative.   Eyes: Negative.   Respiratory: Positive for cough and shortness of breath.   Cardiovascular: Negative.   Gastrointestinal: Positive for nausea.  Endocrine:       High blood sugars  Genitourinary:       Bladder control  Musculoskeletal: Negative.   Skin: Negative.   Allergic/Immunologic: Negative.   Neurological: Positive for weakness.  Hematological: Bruises/bleeds easily.  Psychiatric/Behavioral: Positive for hallucinations. The patient is nervous/anxious.   All other systems reviewed and are negative.      Objective:   Physical Exam Vitals and nursing note reviewed.  Constitutional:      Appearance: Normal appearance. She is obese.  Cardiovascular:     Rate and Rhythm: Normal rate and regular rhythm.     Pulses: Normal pulses.     Heart sounds: Normal heart sounds.  Pulmonary:     Effort: Pulmonary effort is normal.     Breath sounds: Normal breath sounds.  Musculoskeletal:     Cervical back: Normal range of motion and neck supple.     Comments: Normal Muscle Bulk and Muscle Testing Reveals:  Upper Extremities: Full ROM and Muscle Strength  5/5 Lumbar Paraspinal Tenderness: L-3-L-5 Lower Extremities: Full ROM and Muscle  Strength 5/5 Left Lower Extremity Flexion Produces Pain into Left Patella Arises from Table Slowly Narrow Based Gait   Skin:  General: Skin is warm and dry.  Neurological:     Mental Status: She is alert and oriented to person, place, and time.  Psychiatric:        Mood and Affect: Mood normal.        Behavior: Behavior normal.           Assessment & Plan:  1.Spondylosisof Lumbar Region/ Lumbar Facet arthropathy:02/08/2020. Refilled: Oxycodone 10 mg one tablet every 8 hours as needed #90. Second script e-scribefor the following month. We will continue the opioid monitoring program, this consists of regular clinic visits, examinations, urine drug screen, pill counts as well as use of [place] Controlled Substance Reporting System.  2. Morbid obesity: Continue Healthy Diet Regime and HEP.02/08/2020. 3. Type II diabetes: Dr. Buddy Duty following.02/08/2020. 4. Reactive Depression: Continue Zoloft.02/08/2020. 5. Myofascial Muscle Pain: Continuecurrent medication regimen withSoma.02/08/2020 6.LeftChronic Knee Pain: Continue HEP as Tolerated. Continue to Monitor.02/08/2020  72minutes of face to face patient care time was spent during this visit. All questions were encouraged and answered.  F/U in54months

## 2020-02-21 DIAGNOSIS — M549 Dorsalgia, unspecified: Secondary | ICD-10-CM | POA: Diagnosis not present

## 2020-02-21 DIAGNOSIS — R109 Unspecified abdominal pain: Secondary | ICD-10-CM | POA: Diagnosis not present

## 2020-02-21 DIAGNOSIS — R300 Dysuria: Secondary | ICD-10-CM | POA: Diagnosis not present

## 2020-02-24 ENCOUNTER — Other Ambulatory Visit: Payer: Self-pay | Admitting: Gastroenterology

## 2020-02-24 DIAGNOSIS — R809 Proteinuria, unspecified: Secondary | ICD-10-CM | POA: Diagnosis not present

## 2020-02-24 DIAGNOSIS — N3941 Urge incontinence: Secondary | ICD-10-CM | POA: Diagnosis not present

## 2020-02-24 DIAGNOSIS — K5792 Diverticulitis of intestine, part unspecified, without perforation or abscess without bleeding: Secondary | ICD-10-CM | POA: Diagnosis not present

## 2020-02-29 DIAGNOSIS — N3941 Urge incontinence: Secondary | ICD-10-CM | POA: Diagnosis not present

## 2020-03-02 DIAGNOSIS — N3941 Urge incontinence: Secondary | ICD-10-CM | POA: Diagnosis not present

## 2020-03-04 IMAGING — CT CT ABD-PELV W/O CM
2 of 4 series · 16 of 46 positions shown, 18 images · non-contrast
Comparison: 11/07/2019

CLINICAL DATA: 54-year-old female with a history of prior abdominal
drainage performed for multiple abscess. History of diverticulitis.

EXAM:
CT ABDOMEN AND PELVIS WITHOUT CONTRAST
TECHNIQUE: Multidetector CT imaging of the abdomen and pelvis was performed
following the standard protocol without IV contrast.

[Series 2: routine abdomen pelvis without 5.00 br40 s3 axial · axial · non-contrast · 0.98mm/px · z∈[+1290,+1790]mm · 13 of 110 slices shown, 15 images]
[im 5/110  soft-tissue]
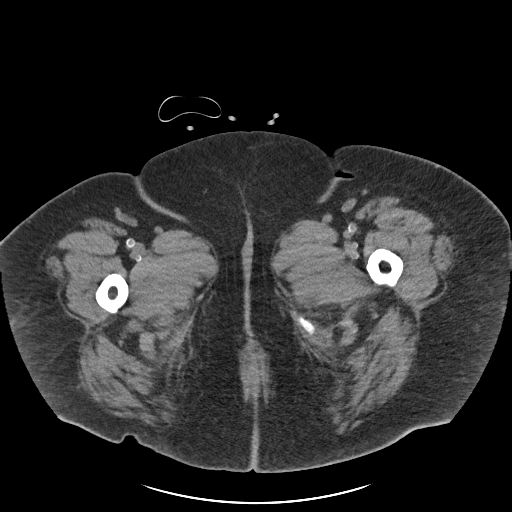
[im 5/110  bone]
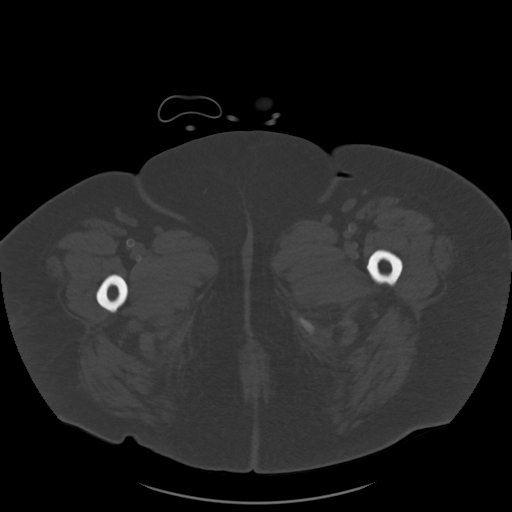
[im 14/110  soft-tissue]
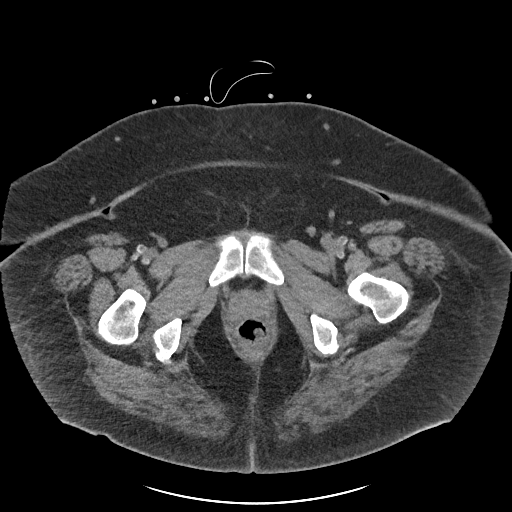
[im 22/110  soft-tissue]
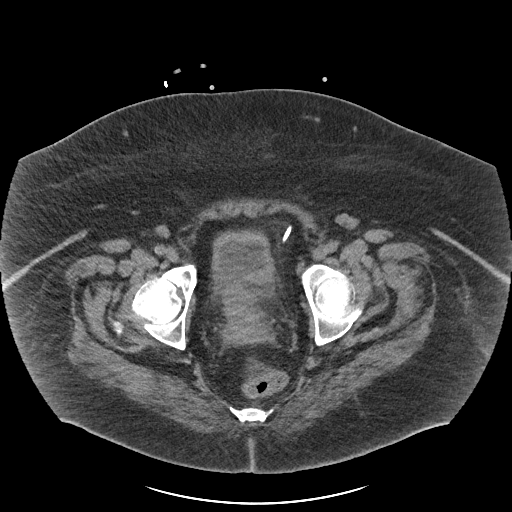
[im 31/110  soft-tissue]
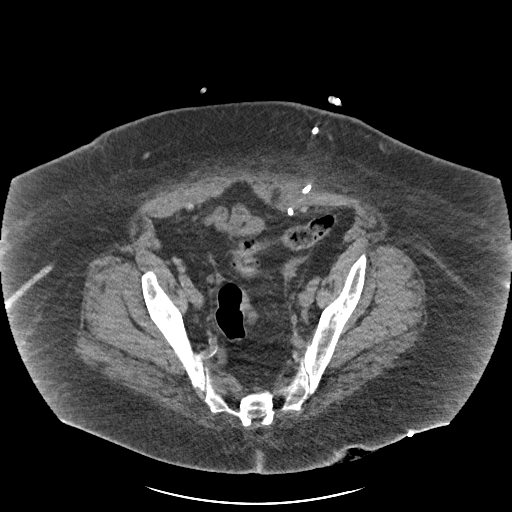
[im 40/110  soft-tissue]
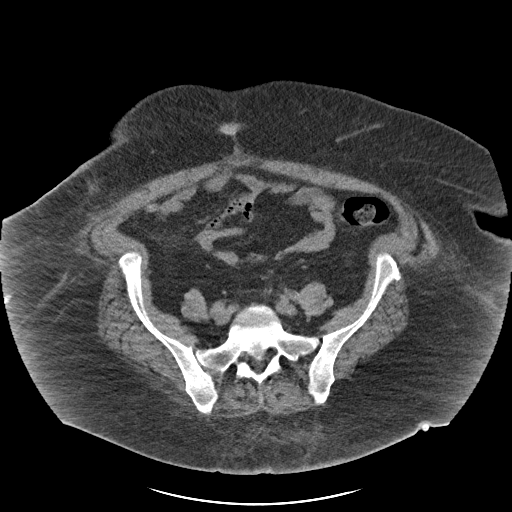
[im 48/110  soft-tissue]
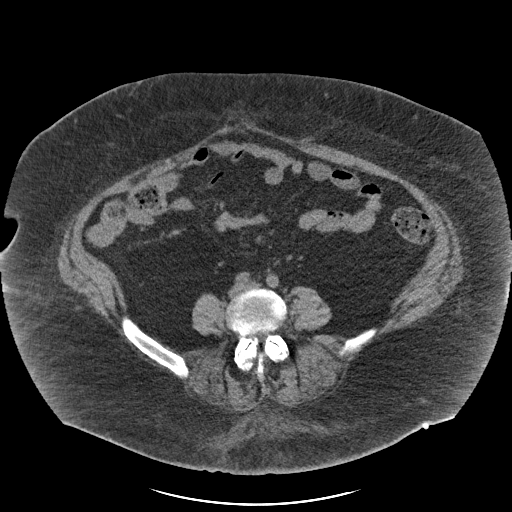
[im 57/110  soft-tissue]
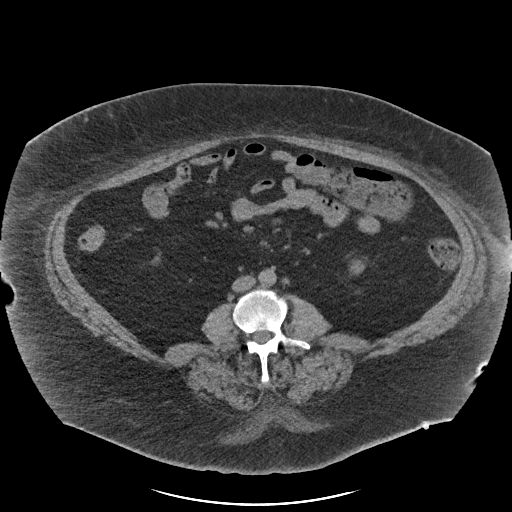
[im 62/110  soft-tissue]
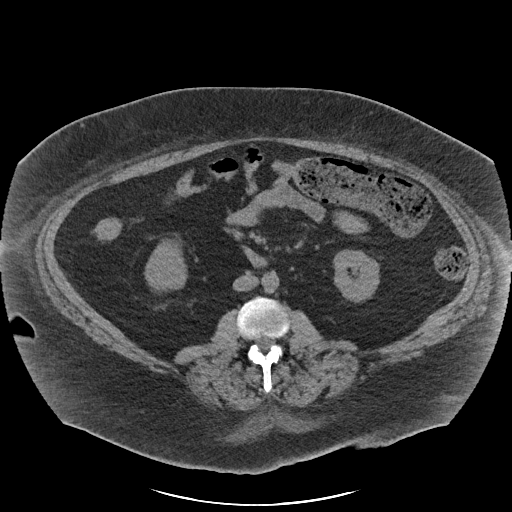
[im 70/110  soft-tissue]
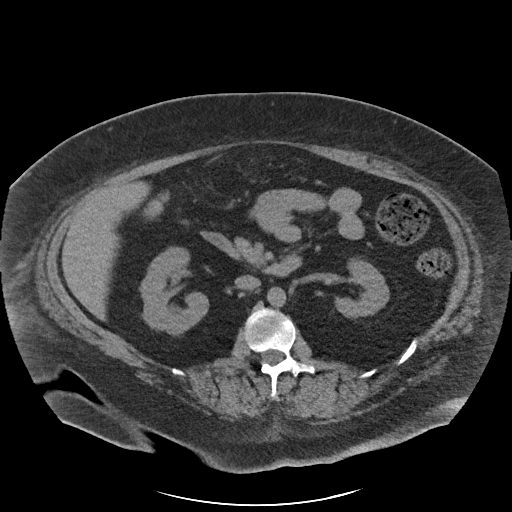
[im 70/110  bone]
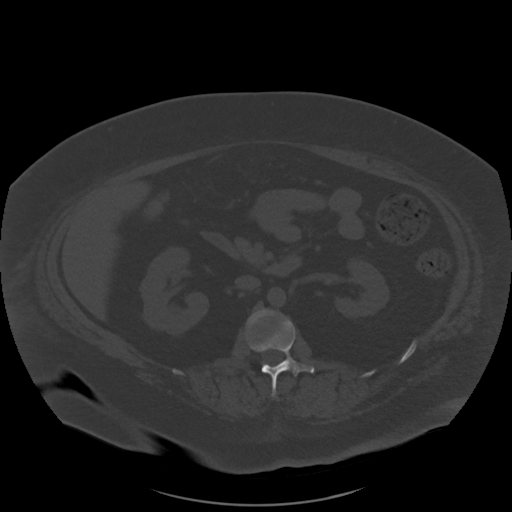
[im 79/110  soft-tissue]
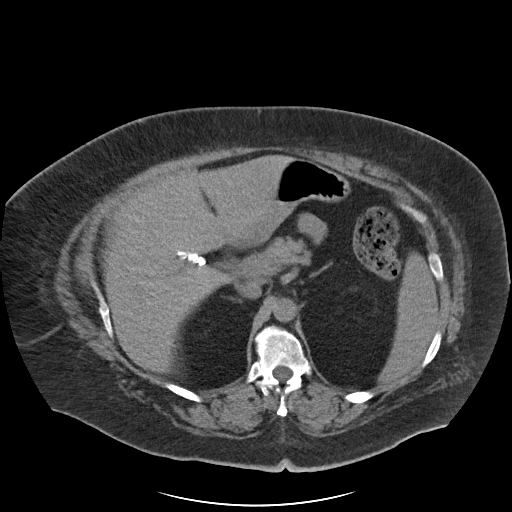
[im 88/110  soft-tissue]
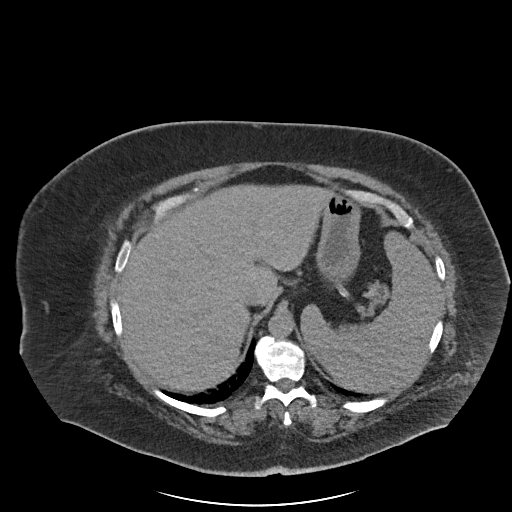
[im 96/110  soft-tissue]
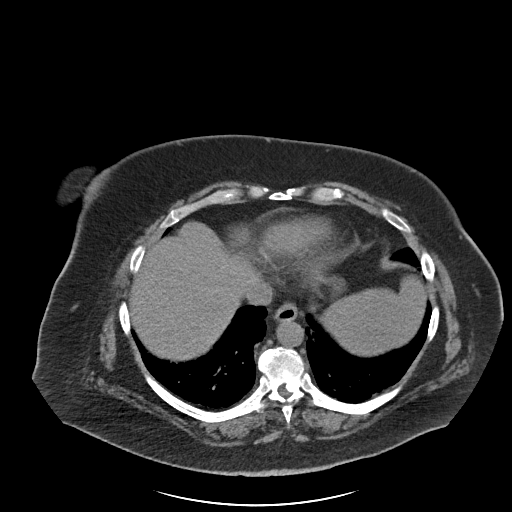
[im 105/110  soft-tissue]
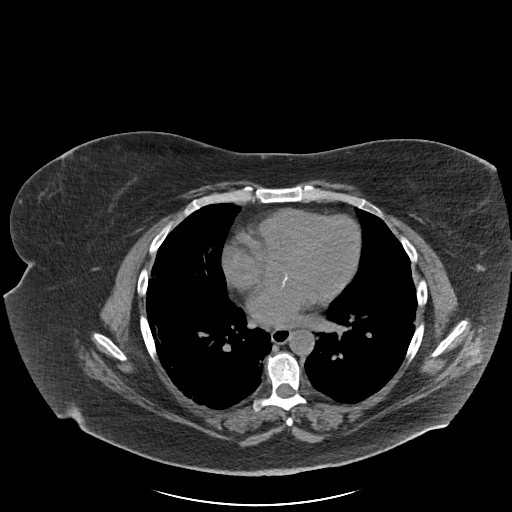

[Series 4: routine abdomen pelvis without 2.00 br40 s3 cor · coronal · non-contrast · 0.98mm/px · 3 of 249 slices shown]
[im 83/249  soft-tissue]
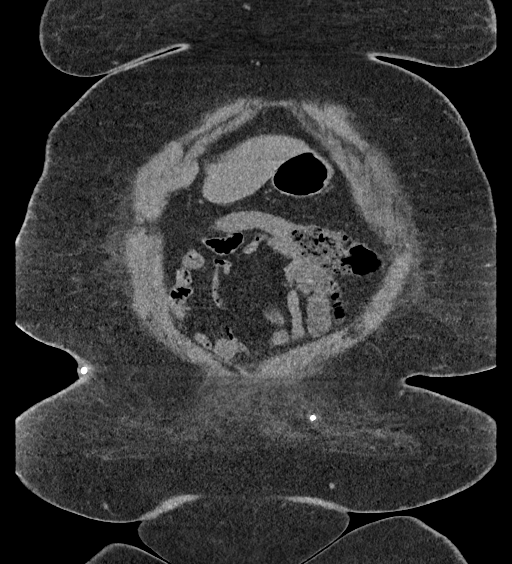
[im 111/249  soft-tissue]
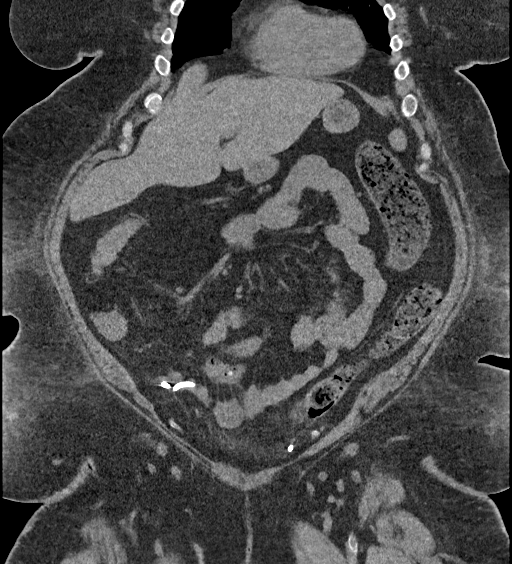
[im 138/249  soft-tissue]
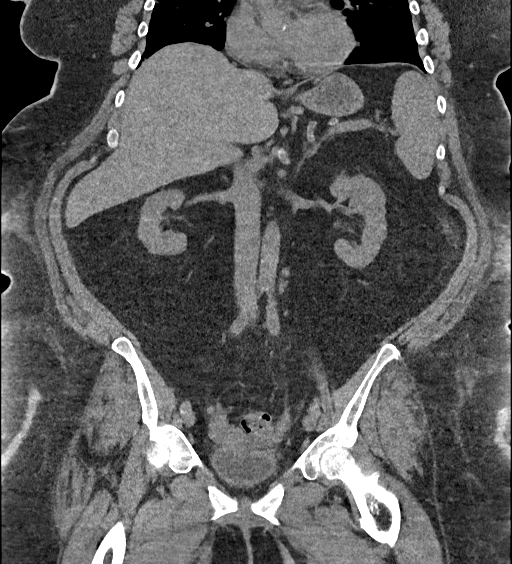

[16 of 46 positions shown; findings below may reference images not displayed]

FINDINGS: Lower chest: Mild pattern of subpleural/peripheral linear opacities
and nodular opacities without confluent airspace disease. Minimal
tree-in-bud opacification in the dependent lung bases. No pleural
effusions.

Hepatobiliary: Cholecystectomy.  Unremarkable liver.

Pancreas: Unremarkable

Spleen: Similar size of the spleen, measuring enlarged at
approximately 15 cm.

Adrenals/Urinary Tract:

- Right adrenal gland:  Unremarkable

- Left adrenal gland: Unremarkable.

- Right kidney: No hydronephrosis, nephrolithiasis, inflammation, or
ureteral dilation. No focal lesion.

- Left Kidney: No hydronephrosis, nephrolithiasis, inflammation, or
ureteral dilation. No focal lesion.

- Urinary Bladder: Urinary bladder decompressed.

Stomach/Bowel:

- Stomach: Unremarkable.

- Small bowel: Unremarkable

- Appendix: Normal.

- Colon: Mild stool burden of the splenic flexure and descending
colon. Diverticular change remains present. Interval resolution of
the inflammatory changes through the sigmoid colon as well as the
adjacent inflammatory changes within the peritoneal fat and
mesentery.

Pigtail drainage catheter within the right lower abdomen, left lower
abdomen/pelvis, and via a left transgluteal approach all remain in
position. There is no residual fluid at any of the drain sites.

Vascular/Lymphatic: Vascular calcifications of the proximal
femoropopliteal vessels and minimally within pelvic arteries. No
aneurysm or significant aortic atherosclerosis.

Small lymph nodes in the retroperitoneum, which are decreasing in
size and conspicuity when compared to the prior CT scan.

Significant reduction in inflammatory changes within the fat of the
abdomen/mesentery.

Reproductive: Unremarkable uterus/adnexa.

Other: Surgical changes of the midline abdomen of a prior hernia
repair.

Musculoskeletal: No acute displaced fracture. Degenerative changes
of the visualized thoracolumbar spine. No bony canal narrowing.
Lucent lesion of L1 favored to represent atypical hemangioma given
the internal architecture.
IMPRESSION: Interval resolution of changes of diverticulitis with resolution of
the associated inflammatory changes of the abdominal mesentery, and
improving retroperitoneum adenopathy.

Three separate pigtail drainage catheters within the abdomen with no
residual fluid collections.

Additional ancillary findings as above.

## 2020-03-06 ENCOUNTER — Other Ambulatory Visit (HOSPITAL_COMMUNITY)
Admission: RE | Admit: 2020-03-06 | Discharge: 2020-03-06 | Disposition: A | Discharge status: Home / Self Care | Payer: PPO | Source: Ambulatory Visit | Attending: Gastroenterology | Admitting: Gastroenterology

## 2020-03-06 DIAGNOSIS — Z01812 Encounter for preprocedural laboratory examination: Secondary | ICD-10-CM | POA: Diagnosis not present

## 2020-03-06 DIAGNOSIS — Z20822 Contact with and (suspected) exposure to covid-19: Secondary | ICD-10-CM | POA: Diagnosis not present

## 2020-03-06 LAB — SARS CORONAVIRUS 2 (TAT 6-24 HRS): SARS Coronavirus 2: NEGATIVE

## 2020-03-08 ENCOUNTER — Other Ambulatory Visit: Payer: Self-pay

## 2020-03-08 ENCOUNTER — Encounter (HOSPITAL_COMMUNITY)
Admission: RE | Disposition: A | Discharge status: Home / Self Care | Payer: Self-pay | Source: Home / Self Care | Attending: Gastroenterology

## 2020-03-08 ENCOUNTER — Ambulatory Visit (HOSPITAL_COMMUNITY): Payer: PPO | Admitting: Anesthesiology

## 2020-03-08 ENCOUNTER — Ambulatory Visit (HOSPITAL_COMMUNITY)
Admission: RE | Admit: 2020-03-08 | Discharge: 2020-03-08 | Disposition: A | Discharge status: Home / Self Care | Payer: PPO | Attending: Gastroenterology | Admitting: Gastroenterology

## 2020-03-08 DIAGNOSIS — Z6841 Body Mass Index (BMI) 40.0 and over, adult: Secondary | ICD-10-CM | POA: Insufficient documentation

## 2020-03-08 DIAGNOSIS — Z8719 Personal history of other diseases of the digestive system: Secondary | ICD-10-CM | POA: Insufficient documentation

## 2020-03-08 DIAGNOSIS — R933 Abnormal findings on diagnostic imaging of other parts of digestive tract: Secondary | ICD-10-CM | POA: Diagnosis present

## 2020-03-08 DIAGNOSIS — I129 Hypertensive chronic kidney disease with stage 1 through stage 4 chronic kidney disease, or unspecified chronic kidney disease: Secondary | ICD-10-CM | POA: Diagnosis not present

## 2020-03-08 DIAGNOSIS — E282 Polycystic ovarian syndrome: Secondary | ICD-10-CM | POA: Insufficient documentation

## 2020-03-08 DIAGNOSIS — Z8673 Personal history of transient ischemic attack (TIA), and cerebral infarction without residual deficits: Secondary | ICD-10-CM | POA: Diagnosis not present

## 2020-03-08 DIAGNOSIS — Z7982 Long term (current) use of aspirin: Secondary | ICD-10-CM | POA: Insufficient documentation

## 2020-03-08 DIAGNOSIS — Z79899 Other long term (current) drug therapy: Secondary | ICD-10-CM | POA: Insufficient documentation

## 2020-03-08 DIAGNOSIS — I10 Essential (primary) hypertension: Secondary | ICD-10-CM | POA: Diagnosis not present

## 2020-03-08 DIAGNOSIS — D120 Benign neoplasm of cecum: Secondary | ICD-10-CM | POA: Insufficient documentation

## 2020-03-08 DIAGNOSIS — N189 Chronic kidney disease, unspecified: Secondary | ICD-10-CM | POA: Diagnosis not present

## 2020-03-08 DIAGNOSIS — J4550 Severe persistent asthma, uncomplicated: Secondary | ICD-10-CM | POA: Insufficient documentation

## 2020-03-08 DIAGNOSIS — Z794 Long term (current) use of insulin: Secondary | ICD-10-CM | POA: Insufficient documentation

## 2020-03-08 DIAGNOSIS — E1122 Type 2 diabetes mellitus with diabetic chronic kidney disease: Secondary | ICD-10-CM | POA: Insufficient documentation

## 2020-03-08 DIAGNOSIS — G4733 Obstructive sleep apnea (adult) (pediatric): Secondary | ICD-10-CM | POA: Insufficient documentation

## 2020-03-08 DIAGNOSIS — Z7951 Long term (current) use of inhaled steroids: Secondary | ICD-10-CM | POA: Diagnosis not present

## 2020-03-08 DIAGNOSIS — Z86718 Personal history of other venous thrombosis and embolism: Secondary | ICD-10-CM | POA: Insufficient documentation

## 2020-03-08 DIAGNOSIS — K635 Polyp of colon: Secondary | ICD-10-CM | POA: Diagnosis not present

## 2020-03-08 DIAGNOSIS — Z86711 Personal history of pulmonary embolism: Secondary | ICD-10-CM | POA: Diagnosis not present

## 2020-03-08 DIAGNOSIS — Z7901 Long term (current) use of anticoagulants: Secondary | ICD-10-CM | POA: Insufficient documentation

## 2020-03-08 DIAGNOSIS — E785 Hyperlipidemia, unspecified: Secondary | ICD-10-CM | POA: Diagnosis not present

## 2020-03-08 DIAGNOSIS — K5730 Diverticulosis of large intestine without perforation or abscess without bleeding: Secondary | ICD-10-CM | POA: Diagnosis not present

## 2020-03-08 HISTORY — PX: POLYPECTOMY: SHX5525

## 2020-03-08 HISTORY — PX: COLONOSCOPY WITH PROPOFOL: SHX5780

## 2020-03-08 LAB — GLUCOSE, CAPILLARY: Glucose-Capillary: 125 mg/dL — ABNORMAL HIGH (ref 70–99)

## 2020-03-08 SURGERY — COLONOSCOPY WITH PROPOFOL
Anesthesia: Monitor Anesthesia Care

## 2020-03-08 MED ORDER — SODIUM CHLORIDE 0.9 % IV SOLN: INTRAVENOUS | Status: DC

## 2020-03-08 MED ORDER — APIXABAN 2.5 MG PO TABS: 2.5000 mg | ORAL_TABLET | Freq: Two times a day (BID) | ORAL | 0 refills | Status: DC

## 2020-03-08 MED ORDER — PROPOFOL 500 MG/50ML IV EMUL
INTRAVENOUS | Status: DC | PRN
  Administered 2020-03-08: 75 ug/kg/min via INTRAVENOUS

## 2020-03-08 MED ORDER — PROMETHAZINE HCL 25 MG/ML IJ SOLN: 6.2500 mg | INTRAMUSCULAR | Status: DC | PRN

## 2020-03-08 MED ORDER — PROPOFOL 10 MG/ML IV BOLUS
INTRAVENOUS | Status: DC | PRN
  Administered 2020-03-08: 20 mg via INTRAVENOUS
  Administered 2020-03-08 (×4): 10 mg via INTRAVENOUS

## 2020-03-08 MED ORDER — LACTATED RINGERS IV SOLN: INTRAVENOUS | Status: DC

## 2020-03-08 SURGICAL SUPPLY — 22 items

## 2020-03-08 NOTE — Anesthesia Postprocedure Evaluation (Signed)
Anesthesia Post Note  Patient: Kristen [name]  Procedure(s) Performed: COLONOSCOPY WITH PROPOFOL (N/A ) POLYPECTOMY     Patient location during evaluation: PACU Anesthesia Type: MAC Level of consciousness: awake and alert and oriented Pain management: pain level controlled Vital Signs Assessment: post-procedure vital signs reviewed and stable Respiratory status: spontaneous breathing, nonlabored ventilation and respiratory function stable Cardiovascular status: blood pressure returned to baseline Postop Assessment: no apparent nausea or vomiting Anesthetic complications: no    Last Vitals:  Vitals:   03/08/20 1025 03/08/20 1030  BP: (!) 135/56 (!) 151/58  Pulse: 74 68  Resp: 15 16  Temp:    SpO2: 99% 100%    Last Pain:  Vitals:   03/08/20 1014  TempSrc: Axillary  PainSc: 0-No pain                 Brennan Bailey

## 2020-03-08 NOTE — Anesthesia Procedure Notes (Signed)
Procedure Name: MAC Date/Time: 03/08/2020 9:42 AM Performed by: Niel Hummer, CRNA Pre-anesthesia Checklist: Patient identified, Emergency Drugs available, Suction available and Patient being monitored Oxygen Delivery Method: Simple face mask

## 2020-03-08 NOTE — Anesthesia Preprocedure Evaluation (Addendum)
Anesthesia Evaluation  Patient identified by MRN, date of birth, ID band Patient awake    Reviewed: Allergy & Precautions, NPO status , Patient's Chart, lab work & pertinent test results  History of Anesthesia Complications Negative for: history of anesthetic complications  Airway Mallampati: II  TM Distance: >3 FB Neck ROM: Full    Dental  (+) Missing,    Pulmonary asthma , sleep apnea and Continuous Positive Airway Pressure Ventilation , PE (2018)   Pulmonary exam normal        Cardiovascular hypertension, Pt. on medications Normal cardiovascular exam     Neuro/Psych  Headaches, Anxiety Depression CVA (2013)    GI/Hepatic negative GI ROS, Neg liver ROS,   Endo/Other  diabetes, Type 2, Insulin DependentMorbid obesity (BMI 57)PCOS  Renal/GU negative Renal ROS  negative genitourinary   Musculoskeletal  (+) Arthritis ,   Abdominal   Peds  Hematology negative hematology ROS (+)   Anesthesia Other Findings Day of surgery medications reviewed with patient.  Reproductive/Obstetrics negative OB ROS                            Anesthesia Physical Anesthesia Plan  ASA: III  Anesthesia Plan: MAC   Post-op Pain Management:    Induction:   PONV Risk Score and Plan: 2 and Treatment may vary due to age or medical condition and Propofol infusion  Airway Management Planned: Natural Airway and Nasal Cannula  Additional Equipment:   Intra-op Plan:   Post-operative Plan:   Informed Consent: I have reviewed the patients History and Physical, chart, labs and discussed the procedure including the risks, benefits and alternatives for the proposed anesthesia with the patient or authorized representative who has indicated his/her understanding and acceptance.       Plan Discussed with: CRNA  Anesthesia Plan Comments:        Anesthesia Quick Evaluation

## 2020-03-08 NOTE — Discharge Instructions (Signed)

## 2020-03-08 NOTE — Transfer of Care (Signed)
Immediate Anesthesia Transfer of Care Note  Patient: Kristen [name]  Procedure(s) Performed: COLONOSCOPY WITH PROPOFOL (N/A ) POLYPECTOMY  Patient Location: PACU  Anesthesia Type:MAC  Level of Consciousness: awake, alert  and oriented  Airway & Oxygen Therapy: Patient Spontanous Breathing and Patient connected to face mask oxygen  Post-op Assessment: Report given to RN, Post -op Vital signs reviewed and stable and Patient moving all extremities X 4  Post vital signs: Reviewed and stable  Last Vitals:  Vitals Value Taken Time  BP    Temp    Pulse    Resp    SpO2      Last Pain:  Vitals:   03/08/20 0915  TempSrc: Oral  PainSc: 0-No pain         Complications: No apparent anesthesia complications

## 2020-03-08 NOTE — Op Note (Signed)
[facility] Patient Name: Kristen [name] Procedure Date: 03/08/2020 MRN: [id number] Attending MD: Arta Silence , MD Date of Birth: [birth year] CSN: [id number] Age: 54 Admit Type: Outpatient Procedure:                Colonoscopy Indications:              Last colonoscopy: 2017, Abnormal CT of the GI tract                            (perforated diverticulitis with abscess; drains                            placed (removed couple months ago)) Providers:                Arta Silence, MD, Cleda Daub, RN, Marguerita Merles, Technician Referring MD:             Dr. Leighton Ruff [name] Surgery) Medicines:                Monitored Anesthesia Care Complications:            No immediate complications. Estimated Blood Loss:     Estimated blood loss: none. Procedure:                Pre-Anesthesia Assessment:                           - Prior to the procedure, a History and Physical                            was performed, and patient medications and                            allergies were reviewed. The patient's tolerance of                            previous anesthesia was also reviewed. The risks                            and benefits of the procedure and the sedation                            options and risks were discussed with the patient.                            All questions were answered, and informed consent                            was obtained. Prior Anticoagulants: The patient has                            taken Eliquis (apixaban), last dose was 3 days  prior to procedure. ASA Grade Assessment: III - A                            patient with severe systemic disease. After                            reviewing the risks and benefits, the patient was                            deemed in satisfactory condition to undergo the                            procedure.  After obtaining informed consent, the colonoscope                            was passed under direct vision. Throughout the                            procedure, the patient's blood pressure, pulse, and                            oxygen saturations were monitored continuously. The                            CF-HQ190L ([id number]) Olympus colonoscope was                            introduced through the anus and advanced to the the                            cecum, identified by appendiceal orifice and                            ileocecal valve. The ileocecal valve, appendiceal                            orifice, and rectum were photographed. The entire                            colon was examined. The colonoscopy was performed                            without difficulty. The patient tolerated the                            procedure well. The quality of the bowel                            preparation was good. Scope In: 9:51:29 AM Scope Out: 10:07:20 AM Scope Withdrawal Time: 0 hours 10 minutes 43 seconds  Total Procedure Duration: 0 hours 15 minutes 51 seconds  Findings:      The perianal and digital rectal examinations were normal.      A few small-mouthed diverticula were found in  the sigmoid colon.      A 5 mm polyp was found in the cecum. The polyp was sessile. The polyp       was removed with a cold snare. Resection and retrieval were complete.      Colon otherwise normal; no other polyps, masses, vascular ectasias, or       inflammatory changes were seen.      The retroflexed view of the distal rectum and anal verge was normal and       showed no anal or rectal abnormalities. Impression:               - Diverticulosis in the sigmoid colon.                           - One 5 mm polyp in the cecum, removed with a cold                            snare. Resected and retrieved.                           - The distal rectum and anal verge are normal on                             retroflexion view. Moderate Sedation:      None Recommendation:           - Patient has a contact number available for                            emergencies. The signs and symptoms of potential                            delayed complications were discussed with the                            patient. Return to normal activities tomorrow.                            Written discharge instructions were provided to the                            patient.                           - Discharge patient to home (ambulatory).                           - Resume previous diet today.                           - Continue present medications.                           - Resume Eliquis (apixaban) at prior dose tomorrow.                           - Await pathology results.                           -  Repeat colonoscopy (date not yet determined) for                            surveillance based on pathology results.                           - Dr. Marcello Moores, based on this colonoscopy report, to                            decide whether or not to do sigmoid colectomy                            versus continued medical observation.                           - Return to GI clinic in 4 months.                           - Return to referring physician as previously                            scheduled. Procedure Code(s):        --- Professional ---                           [phone], Colonoscopy, flexible; with removal of                            tumor(s), polyp(s), or other lesion(s) by snare                            technique Diagnosis Code(s):        --- Professional ---                           K63.5, Polyp of colon                           K57.30, Diverticulosis of large intestine without                            perforation or abscess without bleeding                           R93.3, Abnormal findings on diagnostic imaging of                            other parts of digestive tract CPT copyright  2019 American Medical Association. All rights reserved. The codes documented in this report are preliminary and upon coder review may  be revised to meet current compliance requirements. Arta Silence, MD 03/08/2020 10:23:21 AM This report has been signed electronically. Number of Addenda: 0

## 2020-03-08 NOTE — H&P (Signed)
Patient interval history reviewed.  Patient examined again.  There has been no change from documented H/P scanned into chart from our office except as documented below.  Assessment:  1.  Prior diverticulitis with abscess and drains; resolved; drains out > 2 months.  Plan:  1.  Colonoscopy. 2.  Risks (bleeding, infection, bowel perforation that could require surgery, sedation-related changes in cardiopulmonary systems), benefits (identification and possible treatment of source of symptoms, exclusion of certain causes of symptoms), and alternatives (watchful waiting, radiographic imaging studies, empiric medical treatment) of colonoscopy were explained to patient/family in detail and patient wishes to proceed.

## 2020-03-09 ENCOUNTER — Encounter: Payer: Self-pay | Admitting: *Deleted

## 2020-03-09 LAB — SURGICAL PATHOLOGY

## 2020-03-22 DIAGNOSIS — L03116 Cellulitis of left lower limb: Secondary | ICD-10-CM | POA: Diagnosis not present

## 2020-03-22 DIAGNOSIS — M7989 Other specified soft tissue disorders: Secondary | ICD-10-CM | POA: Diagnosis not present

## 2020-03-23 ENCOUNTER — Other Ambulatory Visit: Payer: Self-pay | Admitting: Family Medicine

## 2020-03-23 DIAGNOSIS — M7989 Other specified soft tissue disorders: Secondary | ICD-10-CM

## 2020-03-28 ENCOUNTER — Ambulatory Visit
Admission: RE | Admit: 2020-03-28 | Discharge: 2020-03-28 | Disposition: A | Discharge status: Home / Self Care | Payer: PPO | Source: Ambulatory Visit | Attending: Family Medicine | Admitting: Family Medicine

## 2020-03-28 DIAGNOSIS — M7989 Other specified soft tissue disorders: Secondary | ICD-10-CM

## 2020-03-28 DIAGNOSIS — R600 Localized edema: Secondary | ICD-10-CM | POA: Diagnosis not present

## 2020-04-11 ENCOUNTER — Encounter: Payer: PPO | Attending: Registered Nurse | Admitting: Registered Nurse

## 2020-04-11 ENCOUNTER — Encounter: Payer: Self-pay | Admitting: Registered Nurse

## 2020-04-11 ENCOUNTER — Other Ambulatory Visit: Payer: Self-pay

## 2020-04-11 VITALS — BP 136/80 | HR 91 | Temp 97.3°F | Ht 68.0 in | Wt 375.2 lb

## 2020-04-11 DIAGNOSIS — M7918 Myalgia, other site: Secondary | ICD-10-CM

## 2020-04-11 DIAGNOSIS — Z79891 Long term (current) use of opiate analgesic: Secondary | ICD-10-CM | POA: Diagnosis not present

## 2020-04-11 DIAGNOSIS — M546 Pain in thoracic spine: Secondary | ICD-10-CM | POA: Diagnosis not present

## 2020-04-11 DIAGNOSIS — G8929 Other chronic pain: Secondary | ICD-10-CM | POA: Diagnosis not present

## 2020-04-11 DIAGNOSIS — M25562 Pain in left knee: Secondary | ICD-10-CM | POA: Diagnosis not present

## 2020-04-11 DIAGNOSIS — G894 Chronic pain syndrome: Secondary | ICD-10-CM | POA: Insufficient documentation

## 2020-04-11 DIAGNOSIS — R300 Dysuria: Secondary | ICD-10-CM | POA: Diagnosis not present

## 2020-04-11 DIAGNOSIS — Z5181 Encounter for therapeutic drug level monitoring: Secondary | ICD-10-CM | POA: Diagnosis not present

## 2020-04-11 DIAGNOSIS — E1122 Type 2 diabetes mellitus with diabetic chronic kidney disease: Secondary | ICD-10-CM | POA: Diagnosis not present

## 2020-04-11 DIAGNOSIS — M47816 Spondylosis without myelopathy or radiculopathy, lumbar region: Secondary | ICD-10-CM | POA: Insufficient documentation

## 2020-04-11 DIAGNOSIS — D649 Anemia, unspecified: Secondary | ICD-10-CM | POA: Diagnosis not present

## 2020-04-11 DIAGNOSIS — R109 Unspecified abdominal pain: Secondary | ICD-10-CM | POA: Diagnosis not present

## 2020-04-11 DIAGNOSIS — M549 Dorsalgia, unspecified: Secondary | ICD-10-CM | POA: Diagnosis not present

## 2020-04-11 MED ORDER — CARISOPRODOL 350 MG PO TABS: 350.0000 mg | ORAL_TABLET | Freq: Three times a day (TID) | ORAL | 2 refills | Status: DC | PRN

## 2020-04-11 MED ORDER — OXYCODONE HCL 10 MG PO TABS: 10.0000 mg | ORAL_TABLET | Freq: Three times a day (TID) | ORAL | 0 refills | Status: DC | PRN

## 2020-04-11 NOTE — Progress Notes (Signed)
Subjective:    Patient ID: Kristen [name], female    DOB: 02-28-1966, 54 y.o.   MRN: [id number]  HPI: Kristen [name] is a 54 y.o. female who returns for follow up appointment for chronic pain and medication refill. She states her pain is located in her lower back, left lower extremity and left knee. Also states she has a migraine for the last five days. She rates her pain 7. Her current exercise regime is walking and performing stretching exercises.  [name] Morphine equivalent is 45.00 MME.    Last Oral Swab was Performed on 12/15/2019, it was consistent.    Pain Inventory Average Pain 6 Pain Right Now 7 My pain is constant, stabbing, tingling and aching  In the last 24 hours, has pain interfered with the following? General activity 2 Relation with others 1 Enjoyment of life 0 What TIME of day is your pain at its worst? Day & Evening Sleep (in general) Poor  Pain is worse with: walking, bending, standing and some activites Pain improves with: rest, heat/ice and medication Relief from Meds: 6  Mobility walk without assistance how many minutes can you walk? 7 min before back pain sets in. ability to climb steps?  yes do you drive?  yes use a wheelchair Do you have any goals in this area?  yes  Function disabled: date disabled 2017 I need assistance with the following:  household duties and shopping Do you have any goals in this area?  yes  Neuro/Psych bladder control problems tingling depression anxiety  Prior Studies Any changes since last visit?  no  Physicians involved in your care Any changes since last visit?  no   Family History  Problem Relation Age of Onset  . Hypertension Maternal Grandmother   . Stroke Maternal Grandmother   . Diabetes Maternal Grandfather   . Cancer Mother        lung  . Cancer Father   . Diabetes Sister   . Breast cancer Cousin   . Heart attack Neg Hx    Social History   Socioeconomic History  . Marital  status: Single    Spouse name: Not on file  . Number of children: Not on file  . Years of education: Not on file  . Highest education level: Not on file  Occupational History  . Not on file  Tobacco Use  . Smoking status: Never Smoker  . Smokeless tobacco: Never Used  Vaping Use  . Vaping Use: Never used  Substance and Sexual Activity  . Alcohol use: No    Alcohol/week: 0.0 standard drinks  . Drug use: No  . Sexual activity: Not Currently    Comment: 1st intercourse 66 yo-1 partner  Other Topics Concern  . Not on file  Social History Narrative  . Not on file   Social Determinants of Health   Financial Resource Strain:   . Difficulty of Paying Living Expenses:   Food Insecurity:   . Worried About Charity fundraiser in the Last Year:   . Arboriculturist in the Last Year:   Transportation Needs:   . Film/video editor (Medical):   Marland Kitchen Lack of Transportation (Non-Medical):   Physical Activity:   . Days of Exercise per Week:   . Minutes of Exercise per Session:   Stress:   . Feeling of Stress :   Social Connections:   . Frequency of Communication with Friends and Family:   . Frequency of Social  Gatherings with Friends and Family:   . Attends Religious Services:   . Active Member of Clubs or Organizations:   . Attends Archivist Meetings:   Marland Kitchen Marital Status:    Past Surgical History:  Procedure Laterality Date  . ABDOMINAL SURGERY     hernia repair  . CHOLECYSTECTOMY  [id number]  . COLONOSCOPY WITH PROPOFOL N/A 07/31/2016   Procedure: COLONOSCOPY WITH PROPOFOL;  Surgeon: Arta Silence, MD;  Location: [facility];  Service: Endoscopy;  Laterality: N/A;  . COLONOSCOPY WITH PROPOFOL N/A 03/08/2020   Procedure: COLONOSCOPY WITH PROPOFOL;  Surgeon: Arta Silence, MD;  Location: [facility];  Service: Endoscopy;  Laterality: N/A;  . FOOT SURGERY Right 06/2007  . IR RADIOLOGIST EVAL & MGMT  12/14/2019  . KNEE ARTHROSCOPY  09/2008   left  . KNEE ARTHROSCOPY   april 2011   left  . NASAL SINUS SURGERY    . NASAL SINUS SURGERY  1990  . POLYPECTOMY  03/08/2020   Procedure: POLYPECTOMY;  Surgeon: Arta Silence, MD;  Location: [facility];  Service: Endoscopy;;  . SHOULDER SURGERY  2004   left   Past Medical History:  Diagnosis Date  . Anxiety   . Asthma   . 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 Depression   . Diabetes mellitus   . History of pulmonary embolus (PE)   . Hypertension   . Migraines   . Near syncope   . Obesity   . PCOS (polycystic ovarian syndrome)   . Peripheral edema   . Sleep apnea    uses cpap set on 10  . 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    BP 136/80   Pulse 91   Temp (!) 97.3 F (36.3 C)   Ht 5\' 8"  (1.727 m)   Wt (!) 375 lb 3.2 oz (170.2 kg)   LMP  (LMP Unknown)   SpO2 96%   BMI 57.05 kg/m   Opioid Risk Score:   Fall Risk Score:  `1  Depression screen PHQ 2/9  Depression screen [facility] 2/9 02/08/2020 03/30/2018 02/20/2018 01/26/2018 12/05/2017 12/25/2016 07/01/2016  Decreased Interest 3 2 0 3 3 3 2   Down, Depressed, Hopeless 3 2 0 3 3 3 3   PHQ - 2 Score 6 4 0 6 6 6 5   Altered sleeping - - - 0 - - 3  Tired, decreased energy - - - 2 - - 3  Change in appetite - - - 0 - - 1  Feeling bad or failure about yourself  - - - 3 - - 3  Trouble concentrating - - - 0 - - 1  Moving slowly or fidgety/restless - - - 0 - - 2  Suicidal thoughts - - - 0 - - 1  PHQ-9 Score - - - 11 - - 19  Difficult doing work/chores - - - Very difficult - - -   Review of Systems  Constitutional: Negative.   HENT: Negative.   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   Cardiovascular: Negative.   Gastrointestinal: Negative.   Endocrine: Negative.   Genitourinary:       Over active bladder  Musculoskeletal: Positive for back pain and gait problem.  Skin: Negative.   Allergic/Immunologic: Negative.   Neurological: Positive for headaches.       Tingling  Psychiatric/Behavioral: Positive for suicidal  ideas.       Depression , anxiety  Objective:   Physical Exam Vitals and nursing note reviewed.  Constitutional:      Appearance: Normal appearance.  Cardiovascular:     Rate and Rhythm: Normal rate and regular rhythm.     Pulses: Normal pulses.     Heart sounds: Normal heart sounds.  Pulmonary:     Effort: Pulmonary effort is normal.     Breath sounds: Normal breath sounds.  Musculoskeletal:     Cervical back: Normal range of motion and neck supple.     Comments: Normal Muscle Bulk and Muscle Testing Reveals:  Upper Extremities:Full  ROM and Muscle Strength 5/5 Lumbar Paraspinal Tenderness: L-4-L-5 Lower Extremities: Full ROM and Muscle Strength 5/5 Arises from Table with ease Narrow Based  Gait   Skin:    General: Skin is warm and dry.  Neurological:     Mental Status: She is alert and oriented to person, place, and time.  Psychiatric:        Mood and Affect: Mood normal.        Behavior: Behavior normal.           Assessment & Plan:  1.Spondylosisof Lumbar Region/ Lumbar Facet arthropathy:04/11/2020. Refilled: Oxycodone 10 mg one tablet every 8 hours as needed #90. Second script e-scribefor the following month. We will continue the opioid monitoring program, this consists of regular clinic visits, examinations, urine drug screen, pill counts as well as use of [place] Controlled Substance Reporting System.  2. Morbid obesity: Continue Healthy Diet Regime and HEP.04/11/2020. 3. Type II diabetes: Dr. Buddy Duty following.04/11/2020. 4. Reactive Depression: Continue Zoloft.04/11/2020. 5. Myofascial Muscle Pain: Continuecurrent medication regimen withSoma.04/11/2020 6.LeftChronic Knee Pain:Continue HEP as Tolerated. Continue to Monitor.04/11/2020  77minutes of face to face patient care time was spent during this visit. All questions were encouraged and answered.  F/U in58months

## 2020-04-17 DIAGNOSIS — E113293 Type 2 diabetes mellitus with mild nonproliferative diabetic retinopathy without macular edema, bilateral: Secondary | ICD-10-CM | POA: Diagnosis not present

## 2020-04-17 DIAGNOSIS — Z794 Long term (current) use of insulin: Secondary | ICD-10-CM | POA: Diagnosis not present

## 2020-04-17 DIAGNOSIS — N1831 Chronic kidney disease, stage 3a: Secondary | ICD-10-CM | POA: Diagnosis not present

## 2020-04-17 DIAGNOSIS — E1122 Type 2 diabetes mellitus with diabetic chronic kidney disease: Secondary | ICD-10-CM | POA: Diagnosis not present

## 2020-05-08 DIAGNOSIS — F419 Anxiety disorder, unspecified: Secondary | ICD-10-CM | POA: Diagnosis not present

## 2020-05-08 DIAGNOSIS — F329 Major depressive disorder, single episode, unspecified: Secondary | ICD-10-CM | POA: Diagnosis not present

## 2020-05-08 DIAGNOSIS — Z6841 Body Mass Index (BMI) 40.0 and over, adult: Secondary | ICD-10-CM | POA: Diagnosis not present

## 2020-05-08 DIAGNOSIS — E119 Type 2 diabetes mellitus without complications: Secondary | ICD-10-CM | POA: Diagnosis not present

## 2020-05-08 DIAGNOSIS — G8929 Other chronic pain: Secondary | ICD-10-CM | POA: Diagnosis not present

## 2020-05-08 DIAGNOSIS — E785 Hyperlipidemia, unspecified: Secondary | ICD-10-CM | POA: Diagnosis not present

## 2020-05-08 DIAGNOSIS — M549 Dorsalgia, unspecified: Secondary | ICD-10-CM | POA: Diagnosis not present

## 2020-05-08 DIAGNOSIS — Z8679 Personal history of other diseases of the circulatory system: Secondary | ICD-10-CM | POA: Diagnosis not present

## 2020-05-08 DIAGNOSIS — Z794 Long term (current) use of insulin: Secondary | ICD-10-CM | POA: Diagnosis not present

## 2020-05-08 DIAGNOSIS — Z01810 Encounter for preprocedural cardiovascular examination: Secondary | ICD-10-CM | POA: Diagnosis not present

## 2020-05-08 DIAGNOSIS — Z86718 Personal history of other venous thrombosis and embolism: Secondary | ICD-10-CM | POA: Diagnosis not present

## 2020-05-08 DIAGNOSIS — I10 Essential (primary) hypertension: Secondary | ICD-10-CM | POA: Diagnosis not present

## 2020-05-08 DIAGNOSIS — Z7982 Long term (current) use of aspirin: Secondary | ICD-10-CM | POA: Diagnosis not present

## 2020-05-08 DIAGNOSIS — G4733 Obstructive sleep apnea (adult) (pediatric): Secondary | ICD-10-CM | POA: Diagnosis not present

## 2020-05-08 DIAGNOSIS — J45909 Unspecified asthma, uncomplicated: Secondary | ICD-10-CM | POA: Diagnosis not present

## 2020-05-08 DIAGNOSIS — D649 Anemia, unspecified: Secondary | ICD-10-CM | POA: Diagnosis not present

## 2020-05-08 DIAGNOSIS — R110 Nausea: Secondary | ICD-10-CM | POA: Diagnosis not present

## 2020-05-08 DIAGNOSIS — Z01812 Encounter for preprocedural laboratory examination: Secondary | ICD-10-CM | POA: Diagnosis not present

## 2020-05-08 DIAGNOSIS — H5461 Unqualified visual loss, right eye, normal vision left eye: Secondary | ICD-10-CM | POA: Diagnosis not present

## 2020-05-08 DIAGNOSIS — N3941 Urge incontinence: Secondary | ICD-10-CM | POA: Diagnosis not present

## 2020-05-15 DIAGNOSIS — Z1152 Encounter for screening for COVID-19: Secondary | ICD-10-CM | POA: Diagnosis not present

## 2020-05-16 ENCOUNTER — Telehealth: Payer: Self-pay

## 2020-05-16 NOTE — Telephone Encounter (Signed)
Kristen [name] is out of Oxycodone 10 MG & [facility] does not have it in stock. However they do have Oxycodone 5 MG on hand. Patient wanted to know if you will send in the Oxycodone 5 MG?   Please advise.  Thank you

## 2020-05-16 NOTE — Telephone Encounter (Signed)
I sent Ms. Espin a [employer], awaiting her response.

## 2020-05-17 ENCOUNTER — Telehealth: Payer: Self-pay | Admitting: Registered Nurse

## 2020-05-17 DIAGNOSIS — M47816 Spondylosis without myelopathy or radiculopathy, lumbar region: Secondary | ICD-10-CM

## 2020-05-17 MED ORDER — OXYCODONE HCL 10 MG PO TABS: 10.0000 mg | ORAL_TABLET | Freq: Three times a day (TID) | ORAL | 0 refills | Status: DC | PRN

## 2020-05-17 NOTE — Telephone Encounter (Signed)
PMP was Reviewed: Oxycodone was e-scribed to [facility]. [facility] was called by Golda Acre RN to remove previous prescription. [name] called and spoke with Ms. Tash regarding the above.

## 2020-05-22 DIAGNOSIS — I10 Essential (primary) hypertension: Secondary | ICD-10-CM | POA: Diagnosis not present

## 2020-05-22 DIAGNOSIS — E785 Hyperlipidemia, unspecified: Secondary | ICD-10-CM | POA: Diagnosis not present

## 2020-05-22 DIAGNOSIS — Z86718 Personal history of other venous thrombosis and embolism: Secondary | ICD-10-CM | POA: Diagnosis not present

## 2020-05-22 DIAGNOSIS — Z794 Long term (current) use of insulin: Secondary | ICD-10-CM | POA: Diagnosis not present

## 2020-05-22 DIAGNOSIS — Z7982 Long term (current) use of aspirin: Secondary | ICD-10-CM | POA: Diagnosis not present

## 2020-05-22 DIAGNOSIS — J45909 Unspecified asthma, uncomplicated: Secondary | ICD-10-CM | POA: Diagnosis not present

## 2020-05-22 DIAGNOSIS — G4733 Obstructive sleep apnea (adult) (pediatric): Secondary | ICD-10-CM | POA: Diagnosis not present

## 2020-05-22 DIAGNOSIS — N3941 Urge incontinence: Secondary | ICD-10-CM | POA: Diagnosis not present

## 2020-05-22 DIAGNOSIS — E119 Type 2 diabetes mellitus without complications: Secondary | ICD-10-CM | POA: Diagnosis not present

## 2020-05-22 DIAGNOSIS — Z9682 Presence of neurostimulator: Secondary | ICD-10-CM | POA: Diagnosis not present

## 2020-05-22 DIAGNOSIS — G8929 Other chronic pain: Secondary | ICD-10-CM | POA: Diagnosis not present

## 2020-05-22 DIAGNOSIS — H5461 Unqualified visual loss, right eye, normal vision left eye: Secondary | ICD-10-CM | POA: Diagnosis not present

## 2020-05-22 DIAGNOSIS — D649 Anemia, unspecified: Secondary | ICD-10-CM | POA: Diagnosis not present

## 2020-05-22 DIAGNOSIS — Z6841 Body Mass Index (BMI) 40.0 and over, adult: Secondary | ICD-10-CM | POA: Diagnosis not present

## 2020-06-05 DIAGNOSIS — I10 Essential (primary) hypertension: Secondary | ICD-10-CM | POA: Diagnosis not present

## 2020-06-05 DIAGNOSIS — E1142 Type 2 diabetes mellitus with diabetic polyneuropathy: Secondary | ICD-10-CM | POA: Diagnosis not present

## 2020-06-05 DIAGNOSIS — Z7982 Long term (current) use of aspirin: Secondary | ICD-10-CM | POA: Diagnosis not present

## 2020-06-05 DIAGNOSIS — J45909 Unspecified asthma, uncomplicated: Secondary | ICD-10-CM | POA: Diagnosis not present

## 2020-06-05 DIAGNOSIS — Z86718 Personal history of other venous thrombosis and embolism: Secondary | ICD-10-CM | POA: Diagnosis not present

## 2020-06-05 DIAGNOSIS — E785 Hyperlipidemia, unspecified: Secondary | ICD-10-CM | POA: Diagnosis not present

## 2020-06-05 DIAGNOSIS — Z6841 Body Mass Index (BMI) 40.0 and over, adult: Secondary | ICD-10-CM | POA: Diagnosis not present

## 2020-06-05 DIAGNOSIS — N3941 Urge incontinence: Secondary | ICD-10-CM | POA: Diagnosis not present

## 2020-06-05 DIAGNOSIS — R3915 Urgency of urination: Secondary | ICD-10-CM | POA: Diagnosis not present

## 2020-06-05 DIAGNOSIS — Z7901 Long term (current) use of anticoagulants: Secondary | ICD-10-CM | POA: Diagnosis not present

## 2020-06-05 DIAGNOSIS — Z794 Long term (current) use of insulin: Secondary | ICD-10-CM | POA: Diagnosis not present

## 2020-06-05 DIAGNOSIS — G8929 Other chronic pain: Secondary | ICD-10-CM | POA: Diagnosis not present

## 2020-06-05 DIAGNOSIS — G4730 Sleep apnea, unspecified: Secondary | ICD-10-CM | POA: Diagnosis not present

## 2020-06-05 DIAGNOSIS — D649 Anemia, unspecified: Secondary | ICD-10-CM | POA: Diagnosis not present

## 2020-06-05 DIAGNOSIS — H5461 Unqualified visual loss, right eye, normal vision left eye: Secondary | ICD-10-CM | POA: Diagnosis not present

## 2020-06-05 DIAGNOSIS — R350 Frequency of micturition: Secondary | ICD-10-CM | POA: Diagnosis not present

## 2020-06-13 ENCOUNTER — Telehealth: Payer: Self-pay

## 2020-06-13 ENCOUNTER — Encounter: Payer: PPO | Attending: Registered Nurse | Admitting: Registered Nurse

## 2020-06-13 ENCOUNTER — Other Ambulatory Visit: Payer: Self-pay

## 2020-06-13 VITALS — BP 153/93 | HR 80 | Temp 98.0°F | Ht 68.0 in | Wt 375.0 lb

## 2020-06-13 DIAGNOSIS — G8929 Other chronic pain: Secondary | ICD-10-CM | POA: Insufficient documentation

## 2020-06-13 DIAGNOSIS — Z79891 Long term (current) use of opiate analgesic: Secondary | ICD-10-CM | POA: Diagnosis not present

## 2020-06-13 DIAGNOSIS — Z5181 Encounter for therapeutic drug level monitoring: Secondary | ICD-10-CM | POA: Diagnosis not present

## 2020-06-13 DIAGNOSIS — M546 Pain in thoracic spine: Secondary | ICD-10-CM | POA: Diagnosis not present

## 2020-06-13 DIAGNOSIS — G894 Chronic pain syndrome: Secondary | ICD-10-CM | POA: Insufficient documentation

## 2020-06-13 DIAGNOSIS — M7918 Myalgia, other site: Secondary | ICD-10-CM | POA: Diagnosis not present

## 2020-06-13 DIAGNOSIS — M47816 Spondylosis without myelopathy or radiculopathy, lumbar region: Secondary | ICD-10-CM | POA: Insufficient documentation

## 2020-06-13 MED ORDER — OXYCODONE HCL 10 MG PO TABS: 10.0000 mg | ORAL_TABLET | Freq: Three times a day (TID) | ORAL | 0 refills | Status: DC | PRN

## 2020-06-13 NOTE — Telephone Encounter (Deleted)
Please call     At [place] about Kristen [name] Oxycodone 10 MG.

## 2020-06-13 NOTE — Progress Notes (Signed)
Subjective:    Patient ID: Kristen [name], female    DOB: [birth year], 54 y.o.   MRN: [id number]  HPI: Kristen [name] is a 54 y.o. female who returns for follow up appointment for chronic pain and medication refill. She states her pain is located in her lower back mainly right side.She rates her pain 8. Her current exercise regime is walking and performing stretching exercises.  Kristen [name] had INTERSTIM PERMANENT IMPLANT PLACEMENT STAGE 2-MEDTRONICS, on 06/05/2020 by Dr Amalia Hailey. At [facility].   Kristen [name] Morphine equivalent is 45.00 MME.  Last Oral Swab was Performed on 12/15/2019, it was consistent.   Pain Inventory Average Pain 7 Pain Right Now 8 My pain is constant, sharp, burning and stabbing  In the last 24 hours, has pain interfered with the following? General activity 8 Relation with others 8 Enjoyment of life 8 What TIME of day is your pain at its worst? morning , daytime, evening and night Sleep (in general) Poor  Pain is worse with: walking, bending and standing Pain improves with: rest, heat/ice and medication Relief from Meds: 7  Family History  Problem Relation Age of Onset  . Hypertension Maternal Grandmother   . Stroke Maternal Grandmother   . Diabetes Maternal Grandfather   . Cancer Mother        lung  . Cancer Father   . Diabetes Sister   . Breast cancer Cousin   . Heart attack Neg Hx    Social History   Socioeconomic History  . Marital status: Single    Spouse name: Not on file  . Number of children: Not on file  . Years of education: Not on file  . Highest education level: Not on file  Occupational History  . Not on file  Tobacco Use  . Smoking status: Never Smoker  . Smokeless tobacco: Never Used  Vaping Use  . Vaping Use: Never used  Substance and Sexual Activity  . Alcohol use: No    Alcohol/week: 0.0 standard drinks  . Drug use: No  . Sexual activity: Not Currently    Comment: 1st intercourse 23 yo-1  partner  Other Topics Concern  . Not on file  Social History Narrative  . Not on file   Social Determinants of Health   Financial Resource Strain:   . Difficulty of Paying Living Expenses: Not on file  Food Insecurity:   . Worried About Charity fundraiser in the Last Year: Not on file  . Ran Out of Food in the Last Year: Not on file  Transportation Needs:   . Lack of Transportation (Medical): Not on file  . Lack of Transportation (Non-Medical): Not on file  Physical Activity:   . Days of Exercise per Week: Not on file  . Minutes of Exercise per Session: Not on file  Stress:   . Feeling of Stress : Not on file  Social Connections:   . Frequency of Communication with Friends and Family: Not on file  . Frequency of Social Gatherings with Friends and Family: Not on file  . Attends Religious Services: Not on file  . Active Member of Clubs or Organizations: Not on file  . Attends Archivist Meetings: Not on file  . Marital Status: Not on file   Past Surgical History:  Procedure Laterality Date  . ABDOMINAL SURGERY     hernia repair  . CHOLECYSTECTOMY  [id number]  . COLONOSCOPY WITH PROPOFOL N/A 07/31/2016   Procedure:  COLONOSCOPY WITH PROPOFOL;  Surgeon: Arta Silence, MD;  Location: [facility];  Service: Endoscopy;  Laterality: N/A;  . COLONOSCOPY WITH PROPOFOL N/A 03/08/2020   Procedure: COLONOSCOPY WITH PROPOFOL;  Surgeon: Arta Silence, MD;  Location: [facility];  Service: Endoscopy;  Laterality: N/A;  . FOOT SURGERY Right 06/2007  . IR RADIOLOGIST EVAL & MGMT  12/14/2019  . KNEE ARTHROSCOPY  09/2008   left  . KNEE ARTHROSCOPY  april 2011   left  . NASAL SINUS SURGERY    . NASAL SINUS SURGERY  1990  . POLYPECTOMY  03/08/2020   Procedure: POLYPECTOMY;  Surgeon: Arta Silence, MD;  Location: [facility];  Service: Endoscopy;;  . SHOULDER SURGERY  2004   left   Past Surgical History:  Procedure Laterality Date  . ABDOMINAL SURGERY     hernia repair  .  CHOLECYSTECTOMY  [id number]  . COLONOSCOPY WITH PROPOFOL N/A 07/31/2016   Procedure: COLONOSCOPY WITH PROPOFOL;  Surgeon: Arta Silence, MD;  Location: [facility];  Service: Endoscopy;  Laterality: N/A;  . COLONOSCOPY WITH PROPOFOL N/A 03/08/2020   Procedure: COLONOSCOPY WITH PROPOFOL;  Surgeon: Arta Silence, MD;  Location: [facility];  Service: Endoscopy;  Laterality: N/A;  . FOOT SURGERY Right 06/2007  . IR RADIOLOGIST EVAL & MGMT  12/14/2019  . KNEE ARTHROSCOPY  09/2008   left  . KNEE ARTHROSCOPY  april 2011   left  . NASAL SINUS SURGERY    . NASAL SINUS SURGERY  1990  . POLYPECTOMY  03/08/2020   Procedure: POLYPECTOMY;  Surgeon: Arta Silence, MD;  [facility]: [facility];  Service: Endoscopy;;  . SHOULDER SURGERY  2004   left   Past Medical History:  Diagnosis Date  . Anxiety   . Asthma   . 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 Depression   . Diabetes mellitus   . History of pulmonary embolus (PE)   . Hypertension   . Migraines   . Near syncope   . Obesity   . PCOS (polycystic ovarian syndrome)   . Peripheral edema   . Sleep apnea    uses cpap set on 10  . 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    BP (!) 153/93   Pulse 80   Temp 98 F (36.7 C)   Ht 5\' 8"  (1.727 m)   Wt (!) 375 lb (170.1 kg)   SpO2 93%   BMI 57.02 kg/m   Opioid Risk Score:   Fall Risk Score:  `1  Depression screen PHQ 2/9  Depression screen [facility] 2/9 06/13/2020 04/11/2020 02/08/2020 03/30/2018 02/20/2018 01/26/2018 12/05/2017  Decreased Interest 0 0 3 2 0 3 3  Down, Depressed, Hopeless 0 3 3 2  0 3 3  PHQ - 2 Score 0 3 6 4  0 6 6  Altered sleeping - 3 - - - 0 -  Tired, decreased energy - 3 - - - 2 -  Change in appetite - 2 - - - 0 -  Feeling bad or failure about yourself  - 3 - - - 3 -  Trouble concentrating - 3 - - - 0 -  Moving slowly or fidgety/restless - 3 - - - 0 -  Suicidal thoughts - 1 - - - 0 -  PHQ-9 Score - 21 - - - 11 -  Difficult doing work/chores - - - - - Very  difficult -    Review of Systems  Musculoskeletal: Positive for back pain.  Spasms  Psychiatric/Behavioral: Positive for dysphoric mood. The patient is nervous/anxious.   All other systems reviewed and are negative.      Objective:   Physical Exam Vitals and nursing note reviewed.  Constitutional:      Appearance: Normal appearance.  Cardiovascular:     Rate and Rhythm: Normal rate and regular rhythm.     Pulses: Normal pulses.     Heart sounds: Normal heart sounds.  Pulmonary:     Effort: Pulmonary effort is normal.     Breath sounds: Normal breath sounds.  Musculoskeletal:     Cervical back: Normal range of motion and neck supple.     Comments: Normal Muscle Bulk and Muscle Testing Reveals:  Upper Extremities: Full ROM and Muscle Strength 5/5  Lumbar Paraspinal Tenderness: L-3-L-5 Lower Extremities: Full ROM and Muscle Strength 5/5 Arises from Table with ease Narrow Based Gait   Skin:    General: Skin is warm and dry.  Neurological:     Mental Status: She is alert and oriented to person, place, and time.  Psychiatric:        Mood and Affect: Mood normal.        Behavior: Behavior normal.           Assessment & Plan:  1.Spondylosisof Lumbar Region/ Lumbar Facet arthropathy:06/13/2020. Refilled: Oxycodone 10 mg one tablet every 8 hours as needed #90. Second script e-scribefor the following month. We will continue the opioid monitoring program, this consists of regular clinic visits, examinations, urine drug screen, pill counts as well as use of [place] Controlled Substance Reporting system. A 12 month History has been reviewed on the [place] Controlled Substance Reporting System on 06/13/2020. 2. Morbid obesity: Continue Healthy Diet Regime and HEP.06/13/2020. 3. Type II diabetes: Dr. Buddy Duty following.06/13/2020. 4. Reactive Depression: Continue Zoloft.06/13/2020. 5. Myofascial Muscle Pain: Continuecurrent medication regimen  withSoma.06/13/2020 6.LeftChronic Knee Pain: No complaints today. Continue HEP as Tolerated. Continue to Monitor.06/13/2020  39minutes of face to face patient care time was spent during this visit. All questions were encouraged and answered.  F/U in29months

## 2020-06-14 MED FILL — oxyCODONE HCL 10 MG TABS: 10 | 30 days supply | Qty: 90 | Fill #0

## 2020-06-17 ENCOUNTER — Encounter: Payer: Self-pay | Admitting: Registered Nurse

## 2020-06-21 ENCOUNTER — Other Ambulatory Visit: Payer: PPO

## 2020-06-21 ENCOUNTER — Other Ambulatory Visit: Payer: Self-pay

## 2020-06-21 DIAGNOSIS — Z20822 Contact with and (suspected) exposure to covid-19: Secondary | ICD-10-CM

## 2020-06-22 DIAGNOSIS — Z09 Encounter for follow-up examination after completed treatment for conditions other than malignant neoplasm: Secondary | ICD-10-CM | POA: Diagnosis not present

## 2020-06-22 DIAGNOSIS — N3941 Urge incontinence: Secondary | ICD-10-CM | POA: Diagnosis not present

## 2020-06-22 DIAGNOSIS — L089 Local infection of the skin and subcutaneous tissue, unspecified: Secondary | ICD-10-CM | POA: Diagnosis not present

## 2020-06-23 LAB — NOVEL CORONAVIRUS, NAA: SARS-CoV-2, NAA: NOT DETECTED

## 2020-06-29 DIAGNOSIS — N3941 Urge incontinence: Secondary | ICD-10-CM | POA: Diagnosis not present

## 2020-06-29 DIAGNOSIS — Z09 Encounter for follow-up examination after completed treatment for conditions other than malignant neoplasm: Secondary | ICD-10-CM | POA: Diagnosis not present

## 2020-07-04 DIAGNOSIS — H5201 Hypermetropia, right eye: Secondary | ICD-10-CM | POA: Diagnosis not present

## 2020-07-04 DIAGNOSIS — H47011 Ischemic optic neuropathy, right eye: Secondary | ICD-10-CM | POA: Diagnosis not present

## 2020-07-13 DIAGNOSIS — Z09 Encounter for follow-up examination after completed treatment for conditions other than malignant neoplasm: Secondary | ICD-10-CM | POA: Diagnosis not present

## 2020-07-13 DIAGNOSIS — N3941 Urge incontinence: Secondary | ICD-10-CM | POA: Diagnosis not present

## 2020-07-17 NOTE — Telephone Encounter (Signed)
error 

## 2020-08-11 ENCOUNTER — Other Ambulatory Visit: Payer: Self-pay

## 2020-08-11 ENCOUNTER — Encounter: Payer: PPO | Attending: Registered Nurse | Admitting: Registered Nurse

## 2020-08-11 ENCOUNTER — Encounter: Payer: Self-pay | Admitting: Registered Nurse

## 2020-08-11 VITALS — BP 151/80 | HR 84 | Temp 97.8°F | Ht 68.0 in | Wt 387.0 lb

## 2020-08-11 DIAGNOSIS — Z79891 Long term (current) use of opiate analgesic: Secondary | ICD-10-CM | POA: Diagnosis not present

## 2020-08-11 DIAGNOSIS — Z5181 Encounter for therapeutic drug level monitoring: Secondary | ICD-10-CM | POA: Insufficient documentation

## 2020-08-11 DIAGNOSIS — G894 Chronic pain syndrome: Secondary | ICD-10-CM | POA: Diagnosis not present

## 2020-08-11 DIAGNOSIS — M47816 Spondylosis without myelopathy or radiculopathy, lumbar region: Secondary | ICD-10-CM | POA: Diagnosis not present

## 2020-08-11 DIAGNOSIS — M7918 Myalgia, other site: Secondary | ICD-10-CM | POA: Insufficient documentation

## 2020-08-11 MED ORDER — OXYCODONE HCL 10 MG PO TABS: 10.0000 mg | ORAL_TABLET | Freq: Three times a day (TID) | ORAL | 0 refills | Status: DC | PRN

## 2020-08-11 MED ORDER — CARISOPRODOL 350 MG PO TABS: 350.0000 mg | ORAL_TABLET | Freq: Three times a day (TID) | ORAL | 2 refills | Status: DC | PRN

## 2020-08-11 NOTE — Progress Notes (Signed)
Subjective:    Patient ID: Kristen [name], female    DOB: February 04, 1966, 54 y.o.   MRN: [id number]  HPI: Kristen [name] is a 54 y.o. female who returns for follow up appointment for chronic pain and medication refill. She states her pain is located in her lower back pain. She  Rates her pain 7. Her current exercise regime is walking and performing stretching exercises.  Ms. Lizardo Morphine equivalent is 45.00  MME.    Last Oral Swab was Performed on 12/15/2019, it was consistent.   Pain Inventory Average Pain 7 Pain Right Now 7 My pain is sharp, stabbing and aching  In the last 24 hours, has pain interfered with the following? General activity 5 Relation with others 10 Enjoyment of life 10 What TIME of day is your pain at its worst? morning , daytime and evening Sleep (in general) Fair  Pain is worse with: walking, bending, standing and some activites Pain improves with: rest, heat/ice and medication Relief from Meds:   Family History  Problem Relation Age of Onset  . Hypertension Maternal Grandmother   . Stroke Maternal Grandmother   . Diabetes Maternal Grandfather   . Cancer Mother        lung  . Cancer Father   . Diabetes Sister   . Breast cancer Cousin   . Heart attack Neg Hx    Social History   Socioeconomic History  . Marital status: Single    Spouse name: Not on file  . Number of children: Not on file  . Years of education: Not on file  . Highest education level: Not on file  Occupational History  . Not on file  Tobacco Use  . Smoking status: Never Smoker  . Smokeless tobacco: Never Used  Vaping Use  . Vaping Use: Never used  Substance and Sexual Activity  . Alcohol use: No    Alcohol/week: 0.0 standard drinks  . Drug use: No  . Sexual activity: Not Currently    Comment: 1st intercourse 74 yo-1 partner  Other Topics Concern  . Not on file  Social History Narrative  . Not on file   Social Determinants of Health   Financial Resource  Strain:   . Difficulty of Paying Living Expenses: Not on file  Food Insecurity:   . Worried About Charity fundraiser in the Last Year: Not on file  . Ran Out of Food in the Last Year: Not on file  Transportation Needs:   . Lack of Transportation (Medical): Not on file  . Lack of Transportation (Non-Medical): Not on file  Physical Activity:   . Days of Exercise per Week: Not on file  . Minutes of Exercise per Session: Not on file  Stress:   . Feeling of Stress : Not on file  Social Connections:   . Frequency of Communication with Friends and Family: Not on file  . Frequency of Social Gatherings with Friends and Family: Not on file  . Attends Religious Services: Not on file  . Active Member of Clubs or Organizations: Not on file  . Attends Archivist Meetings: Not on file  . Marital Status: Not on file   Past Surgical History:  Procedure Laterality Date  . ABDOMINAL SURGERY     hernia repair  . CHOLECYSTECTOMY  [id number]  . COLONOSCOPY WITH PROPOFOL N/A 07/31/2016   Procedure: COLONOSCOPY WITH PROPOFOL;  Surgeon: Arta Silence, MD;  [facility];  Service: Endoscopy;  Laterality: N/A;  .  COLONOSCOPY WITH PROPOFOL N/A 03/08/2020   Procedure: COLONOSCOPY WITH PROPOFOL;  Surgeon: Arta Silence, MD;  [facility];  Service: Endoscopy;  Laterality: N/A;  . FOOT SURGERY Right 06/2007  . IR RADIOLOGIST EVAL & MGMT  12/14/2019  . KNEE ARTHROSCOPY  09/2008   left  . KNEE ARTHROSCOPY  april 2011   left  . NASAL SINUS SURGERY    . NASAL SINUS SURGERY  1990  . POLYPECTOMY  03/08/2020   Procedure: POLYPECTOMY;  Surgeon: Arta Silence, MD;  [facility];  Service: Endoscopy;;  . SHOULDER SURGERY  2004   left   Past Surgical History:  Procedure Laterality Date  . ABDOMINAL SURGERY     hernia repair  . CHOLECYSTECTOMY  [id number]  . COLONOSCOPY WITH PROPOFOL N/A 07/31/2016   Procedure: COLONOSCOPY WITH PROPOFOL;  Surgeon: Arta Silence, MD;   [facility];  Service: Endoscopy;  Laterality: N/A;  . COLONOSCOPY WITH PROPOFOL N/A 03/08/2020   Procedure: COLONOSCOPY WITH PROPOFOL;  Surgeon: Arta Silence, MD;  [facility];  Service: Endoscopy;  Laterality: N/A;  . FOOT SURGERY Right 06/2007  . IR RADIOLOGIST EVAL & MGMT  12/14/2019  . KNEE ARTHROSCOPY  09/2008   left  . KNEE ARTHROSCOPY  april 2011   left  . NASAL SINUS SURGERY    . NASAL SINUS SURGERY  1990  . POLYPECTOMY  03/08/2020   Procedure: POLYPECTOMY;  Surgeon: Arta Silence, MD;  [facility];  Service: Endoscopy;;  . SHOULDER SURGERY  2004   left   Past Medical History:  Diagnosis Date  . Anxiety   . Asthma   . 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 Depression   . Diabetes mellitus   . History of pulmonary embolus (PE)   . Hypertension   . Migraines   . Near syncope   . Obesity   . PCOS (polycystic ovarian syndrome)   . Peripheral edema   . Sleep apnea    uses cpap set on 10  . 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    BP (!) 151/80   Pulse 84   Temp 97.8 F (36.6 C)   Ht 5\' 8"  (1.727 m)   Wt (!) 387 lb (175.5 kg)   SpO2 95%   BMI 58.84 kg/m   Opioid Risk Score:   Fall Risk Score:  `1  Depression screen PHQ 2/9  Depression screen [facility] 2/9 06/13/2020 04/11/2020 02/08/2020 03/30/2018 02/20/2018 01/26/2018 12/05/2017  Decreased Interest 0 0 3 2 0 3 3  Down, Depressed, Hopeless 0 3 3 2  0 3 3  PHQ - 2 Score 0 3 6 4  0 6 6  Altered sleeping - 3 - - - 0 -  Tired, decreased energy - 3 - - - 2 -  Change in appetite - 2 - - - 0 -  Feeling bad or failure about yourself  - 3 - - - 3 -  Trouble concentrating - 3 - - - 0 -  Moving slowly or fidgety/restless - 3 - - - 0 -  Suicidal thoughts - 1 - - - 0 -  PHQ-9 Score - 21 - - - 11 -  Difficult doing work/chores - - - - - Very difficult -    Review of Systems     Objective:   Physical Exam Vitals and nursing note reviewed.  Constitutional:      Appearance:  Normal appearance.  Cardiovascular:  Rate and Rhythm: Normal rate and regular rhythm.     Pulses: Normal pulses.     Heart sounds: Normal heart sounds.  Pulmonary:     Effort: Pulmonary effort is normal.     Breath sounds: Normal breath sounds.  Musculoskeletal:     Cervical back: Normal range of motion and neck supple.     Comments: Normal Muscle Bulk and Muscle Testing Reveals:  Upper Extremities: Full ROM and Muscle Strength 5/5 Lumbar Paraspinal Tenderness: L-3-L-5 Lower Extremities: Full ROM and Muscle Strength 5/5 Arises from Table with ease Narrow Based  Gait   Skin:    General: Skin is warm and dry.  Neurological:     Mental Status: She is alert and oriented to person, place, and time.  Psychiatric:        Mood and Affect: Mood normal.        Behavior: Behavior normal.           Assessment & Plan:  1.Spondylosisof Lumbar Region/ Lumbar Facet arthropathy:08/11/2020. Refilled: Oxycodone 10 mg one tablet every 8 hours as needed #90. Second script e-scribefor the following month. We will continue the opioid monitoring program, this consists of regular clinic visits, examinations, urine drug screen, pill counts as well as use of [place] Controlled Substance Reporting system. A 12 month History has been reviewed on the [place] Controlled Substance Reporting System on 08/11/2020. 2. Morbid obesity: Continue Healthy Diet Regime and HEP.08/11/2020. 3. Type II diabetes: Dr. Buddy Duty following.08/11/2020. 4. Reactive Depression: Continue Zoloft.08/11/2020. 5. Myofascial Muscle Pain: Continuecurrent medication regimen withSoma.08/11/2020 6.LeftChronic Knee Pain: No complaints today. Continue HEP as Tolerated. Continue to Monitor.08/11/2020  34minutes of face to face patient care time was spent during this visit. All questions were encouraged and answered.  F/U in67months

## 2020-08-25 DIAGNOSIS — F324 Major depressive disorder, single episode, in partial remission: Secondary | ICD-10-CM | POA: Diagnosis not present

## 2020-08-25 DIAGNOSIS — Z0000 Encounter for general adult medical examination without abnormal findings: Secondary | ICD-10-CM | POA: Diagnosis not present

## 2020-08-25 DIAGNOSIS — D6859 Other primary thrombophilia: Secondary | ICD-10-CM | POA: Diagnosis not present

## 2020-08-25 DIAGNOSIS — Z86711 Personal history of pulmonary embolism: Secondary | ICD-10-CM | POA: Diagnosis not present

## 2020-08-25 DIAGNOSIS — I10 Essential (primary) hypertension: Secondary | ICD-10-CM | POA: Diagnosis not present

## 2020-08-25 DIAGNOSIS — E7800 Pure hypercholesterolemia, unspecified: Secondary | ICD-10-CM | POA: Diagnosis not present

## 2020-08-25 DIAGNOSIS — M5430 Sciatica, unspecified side: Secondary | ICD-10-CM | POA: Diagnosis not present

## 2020-08-25 DIAGNOSIS — J45991 Cough variant asthma: Secondary | ICD-10-CM | POA: Diagnosis not present

## 2020-08-25 DIAGNOSIS — E1122 Type 2 diabetes mellitus with diabetic chronic kidney disease: Secondary | ICD-10-CM | POA: Diagnosis not present

## 2020-08-25 DIAGNOSIS — F411 Generalized anxiety disorder: Secondary | ICD-10-CM | POA: Diagnosis not present

## 2020-08-31 ENCOUNTER — Telehealth: Payer: Self-pay | Admitting: Registered Nurse

## 2020-08-31 DIAGNOSIS — G8929 Other chronic pain: Secondary | ICD-10-CM

## 2020-08-31 DIAGNOSIS — M545 Low back pain, unspecified: Secondary | ICD-10-CM

## 2020-08-31 NOTE — Telephone Encounter (Signed)
Ms. Conrow,  Sent a My-chart message reporting lower back pain increasing in intensity. Will order X-ray. She was instructed if pain is severe to go to the Emergency room to be evaluated. Awaiting on results.  Zella Ball

## 2020-09-01 ENCOUNTER — Telehealth: Payer: Self-pay | Admitting: Registered Nurse

## 2020-09-01 NOTE — Telephone Encounter (Signed)
Placed a call to Kristen [name], she states three weeks ago she begin experiencing increase intensity of lower back pain radiating into her right lower extremity, she denies falling. She states sheen her PCP on 08/25/2020 she received a steroid injection, with no relief noted. Lumbar X-ray ordered, at this moment she's not experiencing any radicular pain she reports. Wil continue to alternate with Ice and Heat therapy, awaiting on Lumbar X-ray results, she verbalizes understanding.

## 2020-09-06 ENCOUNTER — Ambulatory Visit
Admission: RE | Admit: 2020-09-06 | Discharge: 2020-09-06 | Disposition: A | Discharge status: Home / Self Care | Payer: PPO | Source: Ambulatory Visit | Attending: Registered Nurse | Admitting: Registered Nurse

## 2020-09-06 ENCOUNTER — Other Ambulatory Visit: Payer: Self-pay

## 2020-09-06 DIAGNOSIS — M545 Low back pain, unspecified: Secondary | ICD-10-CM | POA: Diagnosis not present

## 2020-09-07 ENCOUNTER — Encounter: Payer: Self-pay | Admitting: Registered Nurse

## 2020-09-07 ENCOUNTER — Telehealth: Payer: Self-pay | Admitting: *Deleted

## 2020-09-07 ENCOUNTER — Encounter: Payer: PPO | Attending: Registered Nurse | Admitting: Registered Nurse

## 2020-09-07 ENCOUNTER — Telehealth: Payer: Self-pay | Admitting: Registered Nurse

## 2020-09-07 VITALS — BP 148/84 | HR 85 | Temp 97.9°F | Ht 68.0 in | Wt 387.0 lb

## 2020-09-07 DIAGNOSIS — Z79891 Long term (current) use of opiate analgesic: Secondary | ICD-10-CM | POA: Diagnosis not present

## 2020-09-07 DIAGNOSIS — M545 Low back pain, unspecified: Secondary | ICD-10-CM

## 2020-09-07 DIAGNOSIS — M5416 Radiculopathy, lumbar region: Secondary | ICD-10-CM

## 2020-09-07 DIAGNOSIS — M7918 Myalgia, other site: Secondary | ICD-10-CM

## 2020-09-07 DIAGNOSIS — M47816 Spondylosis without myelopathy or radiculopathy, lumbar region: Secondary | ICD-10-CM | POA: Diagnosis not present

## 2020-09-07 DIAGNOSIS — G894 Chronic pain syndrome: Secondary | ICD-10-CM

## 2020-09-07 DIAGNOSIS — G8929 Other chronic pain: Secondary | ICD-10-CM | POA: Diagnosis not present

## 2020-09-07 DIAGNOSIS — Z5181 Encounter for therapeutic drug level monitoring: Secondary | ICD-10-CM | POA: Diagnosis not present

## 2020-09-07 MED ORDER — TOPIRAMATE 25 MG PO TABS: 25.0000 mg | ORAL_TABLET | Freq: Every day | ORAL | 2 refills | Status: DC

## 2020-09-07 MED ORDER — OXYCODONE HCL 10 MG PO TABS: 10.0000 mg | ORAL_TABLET | Freq: Four times a day (QID) | ORAL | 0 refills | Status: DC | PRN

## 2020-09-07 MED ORDER — METHYLPREDNISOLONE 4 MG PO TBPK
ORAL_TABLET | ORAL | 0 refills | Status: DC
Start: 2020-09-07 — End: 2021-03-12

## 2020-09-07 NOTE — Telephone Encounter (Signed)
Placed a call to Kristen [name], she will come to office today to be seen for her Acute exacerbation of chronic low back pain with radicular pain. She verbalizes understanding.

## 2020-09-07 NOTE — Telephone Encounter (Signed)
I spoke with pharmacist to discard any rx on hold for Kristen [name] and use the one sent in today.

## 2020-09-07 NOTE — Progress Notes (Signed)
Subjective:    Patient ID: Kristen [name], female    DOB: 04-13-1966, 54 y.o.   MRN: [id number]  HPI: Kristen [name] is a 54 y.o. female who was experiencing increase intensity of lower back pack with radicular pain radiating into her right buttock and right lower extremity. This provider was communicating with Kristen [name] via [employer], she reported her lower back pain began 3 weeks ago. Due to no relief in her intensity of lower back pain, lumbar X-ray was ordered and she was asked to come to the office to be assessed.  She rates her pain 10. Her current exercise regime is walking short distances.  [name] Morphine equivalent is 45.00 MME.     Pain Inventory Average Pain 8 Pain Right Now 10 My pain is constant, sharp, burning and stabbing  In the last 24 hours, has pain interfered with the following? General activity 10 Relation with others 10 Enjoyment of life 10 What TIME of day is your pain at its worst? morning , daytime, evening and night Sleep (in general) Poor  Pain is worse with: walking, standing and some activites Pain improves with: rest, heat/ice and medication Relief from Meds: 7  Family History  Problem Relation Age of Onset  . Hypertension Maternal Grandmother   . Stroke Maternal Grandmother   . Diabetes Maternal Grandfather   . Cancer Mother        lung  . Cancer Father   . Diabetes Sister   . Breast cancer Cousin   . Heart attack Neg Hx    Social History   Socioeconomic History  . Marital status: Single    Spouse name: Not on file  . Number of children: Not on file  . Years of education: Not on file  . Highest education level: Not on file  Occupational History  . Not on file  Tobacco Use  . Smoking status: Never Smoker  . Smokeless tobacco: Never Used  Vaping Use  . Vaping Use: Never used  Substance and Sexual Activity  . Alcohol use: No    Alcohol/week: 0.0 standard drinks  . Drug use: No  . Sexual activity: Not  Currently    Comment: 1st intercourse 22 yo-1 partner  Other Topics Concern  . Not on file  Social History Narrative  . Not on file   Social Determinants of Health   Financial Resource Strain:   . Difficulty of Paying Living Expenses: Not on file  Food Insecurity:   . Worried About Charity fundraiser in the Last Year: Not on file  . Ran Out of Food in the Last Year: Not on file  Transportation Needs:   . Lack of Transportation (Medical): Not on file  . Lack of Transportation (Non-Medical): Not on file  Physical Activity:   . Days of Exercise per Week: Not on file  . Minutes of Exercise per Session: Not on file  Stress:   . Feeling of Stress : Not on file  Social Connections:   . Frequency of Communication with Friends and Family: Not on file  . Frequency of Social Gatherings with Friends and Family: Not on file  . Attends Religious Services: Not on file  . Active Member of Clubs or Organizations: Not on file  . Attends Archivist Meetings: Not on file  . Marital Status: Not on file   Past Surgical History:  Procedure Laterality Date  . ABDOMINAL SURGERY     hernia repair  .  CHOLECYSTECTOMY  [id number]  . COLONOSCOPY WITH PROPOFOL N/A 07/31/2016   Procedure: COLONOSCOPY WITH PROPOFOL;  Surgeon: Arta Silence, MD;  Location: [facility];  Service: Endoscopy;  Laterality: N/A;  . COLONOSCOPY WITH PROPOFOL N/A 03/08/2020   Procedure: COLONOSCOPY WITH PROPOFOL;  Surgeon: Arta Silence, MD;  Location: [facility];  Service: Endoscopy;  Laterality: N/A;  . FOOT SURGERY Right 06/2007  . IR RADIOLOGIST EVAL & MGMT  12/14/2019  . KNEE ARTHROSCOPY  09/2008   left  . KNEE ARTHROSCOPY  april 2011   left  . NASAL SINUS SURGERY    . NASAL SINUS SURGERY  1990  . POLYPECTOMY  03/08/2020   Procedure: POLYPECTOMY;  Surgeon: Arta Silence, MD;  Location: [facility];  Service: Endoscopy;;  . SHOULDER SURGERY  2004   left   Past Surgical History:  Procedure Laterality  Date  . ABDOMINAL SURGERY     hernia repair  . CHOLECYSTECTOMY  [id number]  . COLONOSCOPY WITH PROPOFOL N/A 07/31/2016   Procedure: COLONOSCOPY WITH PROPOFOL;  Surgeon: Arta Silence, MD;  [facility];  Service: Endoscopy;  Laterality: N/A;  . COLONOSCOPY WITH PROPOFOL N/A 03/08/2020   Procedure: COLONOSCOPY WITH PROPOFOL;  Surgeon: Arta Silence, MD;  Location: [facility];  Service: Endoscopy;  Laterality: N/A;  . FOOT SURGERY Right 06/2007  . IR RADIOLOGIST EVAL & MGMT  12/14/2019  . KNEE ARTHROSCOPY  09/2008   left  . KNEE ARTHROSCOPY  april 2011   left  . NASAL SINUS SURGERY    . NASAL SINUS SURGERY  1990  . POLYPECTOMY  03/08/2020   Procedure: POLYPECTOMY;  Surgeon: Arta Silence, MD;  [facility];  Service: Endoscopy;;  . SHOULDER SURGERY  2004   left   Past Medical History:  Diagnosis Date  . Anxiety   . Asthma   . 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 Depression   . Diabetes mellitus   . History of pulmonary embolus (PE)   . Hypertension   . Migraines   . Near syncope   . Obesity   . PCOS (polycystic ovarian syndrome)   . Peripheral edema   . Sleep apnea    uses cpap set on 10  . 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    BP (!) 148/84   Pulse 85   Temp 97.9 F (36.6 C)   Ht 5\' 8"  (1.727 m)   Wt (!) 387 lb (175.5 kg)   SpO2 94%   BMI 58.84 kg/m   Opioid Risk Score:   Fall Risk Score:  `1  Depression screen PHQ 2/9  Depression screen [facility] 2/9 06/13/2020 04/11/2020 02/08/2020 03/30/2018 02/20/2018 01/26/2018 12/05/2017  Decreased Interest 0 0 3 2 0 3 3  Down, Depressed, Hopeless 0 3 3 2  0 3 3  PHQ - 2 Score 0 3 6 4  0 6 6  Altered sleeping - 3 - - - 0 -  Tired, decreased energy - 3 - - - 2 -  Change in appetite - 2 - - - 0 -  Feeling bad or failure about yourself  - 3 - - - 3 -  Trouble concentrating - 3 - - - 0 -  Moving slowly or fidgety/restless - 3 - - - 0 -  Suicidal thoughts - 1 - - - 0 -  PHQ-9 Score - 21 - - -  11 -  Difficult doing work/chores - - - - - Very difficult -  Review of Systems  Constitutional: Negative.   HENT: Negative.   Eyes: Negative.   Respiratory: Negative.   Cardiovascular: Negative.   Gastrointestinal: Negative.   Endocrine: Negative.   Genitourinary: Negative.   Musculoskeletal: Positive for arthralgias, back pain and gait problem.  Skin: Negative.   Allergic/Immunologic: Negative.   Neurological: Positive for numbness.       Tingling  Hematological: Negative.   Psychiatric/Behavioral: Negative.   All other systems reviewed and are negative.      Objective:   Physical Exam Vitals and nursing note reviewed.  Constitutional:      Appearance: Normal appearance. She is obese.  Cardiovascular:     Rate and Rhythm: Normal rate and regular rhythm.     Pulses: Normal pulses.     Heart sounds: Normal heart sounds.  Pulmonary:     Effort: Pulmonary effort is normal.     Breath sounds: Normal breath sounds.  Musculoskeletal:     Cervical back: Normal range of motion and neck supple.     Comments: Normal Muscle Bulk and Muscle Testing Reveals:  Upper Extremities: Full ROM and Muscle Strength 5/5 Lumbar Hypersensitivity Lower Extremities: Decreased ROM and Muscle Strength 5/5 Arises from Table Slowly Antalgic  Gait   Skin:    General: Skin is warm and dry.  Neurological:     Mental Status: She is oriented to person, place, and time.  Psychiatric:        Mood and Affect: Mood normal.        Behavior: Behavior normal.           Assessment & Plan:  1. Acute Excerbation of Chronic Low Back Pain: RX: Medrol Dose Pak. Lumbar X-Ray was performed on 09/06/2020.  IMPRESSION: Lumbar spondylosis, without acute osseous abnormality. Oxycodone increase to every 6 hours this month, Ms. Porzio will send a My-Chart message in two weeks with an update, she verbalizes understanding.  2.Spondylosisof Lumbar Region/ Lumbar Facet arthropathy:09/07/2020. Continue   Oxycodone 10 mg one tablet every 6 hours as needed #90.  We will continue the opioid monitoring program, this consists of regular clinic visits, examinations, urine drug screen, pill counts as well as use of [place] Controlled Substance Reporting system. A 12 month History has been reviewed on the [place] Controlled Substance Reporting Systemon 08/11/2020. 3. Morbid obesity: Continue Healthy Diet Regime and HEP.09/07/2020. 4. Type II diabetes: Dr. Buddy Duty following.09/07/2020. 5. Reactive Depression: Continue Zoloft.09/07/2020. 6. Myofascial Muscle Pain: Continuecurrent medication regimen withSoma.09/07/2020 7.LeftChronic Knee Pain: No complaints today.Continue HEP as Tolerated. Continue to Monitor.09/07/2020  F/U in 2 months

## 2020-09-12 DIAGNOSIS — E1165 Type 2 diabetes mellitus with hyperglycemia: Secondary | ICD-10-CM | POA: Diagnosis not present

## 2020-09-12 DIAGNOSIS — E1122 Type 2 diabetes mellitus with diabetic chronic kidney disease: Secondary | ICD-10-CM | POA: Diagnosis not present

## 2020-09-12 DIAGNOSIS — N1831 Chronic kidney disease, stage 3a: Secondary | ICD-10-CM | POA: Diagnosis not present

## 2020-09-12 DIAGNOSIS — E113293 Type 2 diabetes mellitus with mild nonproliferative diabetic retinopathy without macular edema, bilateral: Secondary | ICD-10-CM | POA: Diagnosis not present

## 2020-09-12 DIAGNOSIS — Z794 Long term (current) use of insulin: Secondary | ICD-10-CM | POA: Diagnosis not present

## 2020-09-25 DIAGNOSIS — I129 Hypertensive chronic kidney disease with stage 1 through stage 4 chronic kidney disease, or unspecified chronic kidney disease: Secondary | ICD-10-CM | POA: Diagnosis not present

## 2020-09-25 DIAGNOSIS — Z9689 Presence of other specified functional implants: Secondary | ICD-10-CM | POA: Diagnosis not present

## 2020-09-25 DIAGNOSIS — J45909 Unspecified asthma, uncomplicated: Secondary | ICD-10-CM | POA: Diagnosis not present

## 2020-09-25 DIAGNOSIS — E785 Hyperlipidemia, unspecified: Secondary | ICD-10-CM | POA: Diagnosis not present

## 2020-09-25 DIAGNOSIS — R3915 Urgency of urination: Secondary | ICD-10-CM | POA: Diagnosis not present

## 2020-09-25 DIAGNOSIS — G4733 Obstructive sleep apnea (adult) (pediatric): Secondary | ICD-10-CM | POA: Diagnosis not present

## 2020-09-25 DIAGNOSIS — N3941 Urge incontinence: Secondary | ICD-10-CM | POA: Diagnosis not present

## 2020-09-25 DIAGNOSIS — Z7901 Long term (current) use of anticoagulants: Secondary | ICD-10-CM | POA: Diagnosis not present

## 2020-09-25 DIAGNOSIS — Z7951 Long term (current) use of inhaled steroids: Secondary | ICD-10-CM | POA: Diagnosis not present

## 2020-09-25 DIAGNOSIS — Z8616 Personal history of COVID-19: Secondary | ICD-10-CM | POA: Diagnosis not present

## 2020-09-25 DIAGNOSIS — R350 Frequency of micturition: Secondary | ICD-10-CM | POA: Diagnosis not present

## 2020-09-25 DIAGNOSIS — Z86718 Personal history of other venous thrombosis and embolism: Secondary | ICD-10-CM | POA: Diagnosis not present

## 2020-09-26 DIAGNOSIS — N189 Chronic kidney disease, unspecified: Secondary | ICD-10-CM | POA: Diagnosis not present

## 2020-09-26 DIAGNOSIS — G43009 Migraine without aura, not intractable, without status migrainosus: Secondary | ICD-10-CM | POA: Diagnosis not present

## 2020-09-26 DIAGNOSIS — F324 Major depressive disorder, single episode, in partial remission: Secondary | ICD-10-CM | POA: Diagnosis not present

## 2020-09-26 DIAGNOSIS — E1122 Type 2 diabetes mellitus with diabetic chronic kidney disease: Secondary | ICD-10-CM | POA: Diagnosis not present

## 2020-09-26 DIAGNOSIS — I10 Essential (primary) hypertension: Secondary | ICD-10-CM | POA: Diagnosis not present

## 2020-09-26 DIAGNOSIS — E1165 Type 2 diabetes mellitus with hyperglycemia: Secondary | ICD-10-CM | POA: Diagnosis not present

## 2020-09-26 DIAGNOSIS — E7800 Pure hypercholesterolemia, unspecified: Secondary | ICD-10-CM | POA: Diagnosis not present

## 2020-09-26 DIAGNOSIS — J45991 Cough variant asthma: Secondary | ICD-10-CM | POA: Diagnosis not present

## 2020-09-26 DIAGNOSIS — D649 Anemia, unspecified: Secondary | ICD-10-CM | POA: Diagnosis not present

## 2020-09-26 DIAGNOSIS — J45909 Unspecified asthma, uncomplicated: Secondary | ICD-10-CM | POA: Diagnosis not present

## 2020-10-09 ENCOUNTER — Ambulatory Visit: Payer: PPO | Admitting: Registered Nurse

## 2020-10-10 ENCOUNTER — Telehealth: Payer: Self-pay | Admitting: *Deleted

## 2020-10-10 NOTE — Telephone Encounter (Signed)
Mrs Jhaveri called to report to Zella Ball that her sciatic pain on her right side is not better and it is not letting up.  She does not have access to myChart right now.  Please advise.

## 2020-10-11 MED ORDER — GABAPENTIN 100 MG PO CAPS
100.0000 mg | ORAL_CAPSULE | Freq: Three times a day (TID) | ORAL | 3 refills | Status: DC
Start: 2020-10-11 — End: 2021-02-12

## 2020-10-11 NOTE — Telephone Encounter (Signed)
Return Ms. Muha call, she reports increase intensity of Lumbar Radicular pain. Also reports no relief with Topamax. She was instructed to discontinue the Topamax, she verbalizes understanding. We will prescribe Gabapentin: She was given the following Instructions.  Gabapentin 100 mg capsule at HS only for  A week  If no relief in her neuropathic pain, she was instructed to increase her Gabapentin to one capsule in the morning and one capsule at HS. If pain persist the following week she was instructed to take her Gabapentin three times a day.  Also instructed to send a My-Chart message in two weeks with an update, she verbalizes understanding.  Also instructed to go to the emergency room if her lower back pain is severe and increases in intensity. She verbalizes understanding.

## 2020-10-19 ENCOUNTER — Other Ambulatory Visit: Payer: Self-pay | Admitting: Physical Medicine and Rehabilitation

## 2020-10-19 ENCOUNTER — Telehealth: Payer: Self-pay | Admitting: *Deleted

## 2020-10-19 DIAGNOSIS — M47816 Spondylosis without myelopathy or radiculopathy, lumbar region: Secondary | ICD-10-CM

## 2020-10-19 MED ORDER — OXYCODONE HCL 10 MG PO TABS: 10.0000 mg | ORAL_TABLET | Freq: Four times a day (QID) | ORAL | 0 refills | Status: DC | PRN

## 2020-10-19 NOTE — Telephone Encounter (Signed)
Notified. 

## 2020-10-19 NOTE — Telephone Encounter (Signed)
Thank you, sent

## 2020-10-19 NOTE — Telephone Encounter (Signed)
Hi [name], I checked her PMP and she has received Hydrocodone-Acetaminophen from another provider on 12/6- can you please ask her about this and let her know she cannot receive narcotics from multiple providers?

## 2020-10-19 NOTE — Telephone Encounter (Signed)
Kristen [name] called and reports she needs a refill on her oxycodone 10 mg q 6 hrs #120.  Her last fill by PMP was 09/07/20 and her next appt with Zella Ball is not until 11/07/20.  Dr Naaman Plummer is also her physician but both he and Zella Ball are off until next week.  Dr Ranell Patrick could you please send her Rx into the pharmacy?

## 2020-10-19 NOTE — Telephone Encounter (Signed)
She had a bladder stimulator inserted 12/6 by Dr Amalia Hailey, and she emailed [facility] about it on 12/10 letting her know.

## 2020-11-07 ENCOUNTER — Encounter: Payer: Medicare HMO | Admitting: Registered Nurse

## 2020-11-15 ENCOUNTER — Other Ambulatory Visit: Payer: Self-pay

## 2020-11-15 ENCOUNTER — Encounter: Payer: Self-pay | Admitting: Registered Nurse

## 2020-11-15 ENCOUNTER — Encounter: Payer: Medicare HMO | Attending: Registered Nurse | Admitting: Registered Nurse

## 2020-11-15 VITALS — BP 157/83 | HR 89 | Temp 97.4°F | Ht 68.0 in | Wt 395.8 lb

## 2020-11-15 DIAGNOSIS — G894 Chronic pain syndrome: Secondary | ICD-10-CM | POA: Insufficient documentation

## 2020-11-15 DIAGNOSIS — M7061 Trochanteric bursitis, right hip: Secondary | ICD-10-CM | POA: Diagnosis not present

## 2020-11-15 DIAGNOSIS — M1711 Unilateral primary osteoarthritis, right knee: Secondary | ICD-10-CM | POA: Diagnosis not present

## 2020-11-15 DIAGNOSIS — Z79891 Long term (current) use of opiate analgesic: Secondary | ICD-10-CM | POA: Diagnosis not present

## 2020-11-15 DIAGNOSIS — Z5181 Encounter for therapeutic drug level monitoring: Secondary | ICD-10-CM | POA: Diagnosis not present

## 2020-11-15 DIAGNOSIS — M5416 Radiculopathy, lumbar region: Secondary | ICD-10-CM | POA: Diagnosis not present

## 2020-11-15 DIAGNOSIS — M47816 Spondylosis without myelopathy or radiculopathy, lumbar region: Secondary | ICD-10-CM | POA: Diagnosis not present

## 2020-11-15 MED ORDER — OXYCODONE HCL 10 MG PO TABS: 10.0000 mg | ORAL_TABLET | Freq: Four times a day (QID) | ORAL | 0 refills | Status: DC | PRN

## 2020-11-15 NOTE — Progress Notes (Signed)
Subjective:    Patient ID: Kristen [name], female    DOB: Jun 30, 1966, 55 y.o.   MRN: [id number]  HPI: Kristen [name] is a 55 y.o. female who returns for follow up appointment for chronic pain and medication refill. She states her pain is located in her lower back radiating into her right lower extremity and  Right hip. Also reports right knee pain. She rates her pain 9. Her current exercise regime is walking and performing stretching exercises.  Kristen [name] Morphine equivalent is 60.00  MME.  Oral Swab was Performed today.   Pain Inventory Average Pain 8 Pain Right Now 9 My pain is constant, dull, stabbing and aching  In the last 24 hours, has pain interfered with the following? General activity 10 Relation with others 10 Enjoyment of life 10 What TIME of day is your pain at its worst? morning , daytime, evening and night Sleep (in general) Poor  Pain is worse with: walking, bending, unsure and some activites Pain improves with: heat/ice, therapy/exercise and medication Relief from Meds: 7  Family History  Problem Relation Age of Onset  . Hypertension Maternal Grandmother   . Stroke Maternal Grandmother   . Diabetes Maternal Grandfather   . Cancer Mother        lung  . Cancer Father   . Diabetes Sister   . Breast cancer Cousin   . Heart attack Neg Hx    Social History   Socioeconomic History  . Marital status: Single    Spouse name: Not on file  . Number of children: Not on file  . Years of education: Not on file  . Highest education level: Not on file  Occupational History  . Not on file  Tobacco Use  . Smoking status: Never Smoker  . Smokeless tobacco: Never Used  Vaping Use  . Vaping Use: Never used  Substance and Sexual Activity  . Alcohol use: No    Alcohol/week: 0.0 standard drinks  . Drug use: No  . Sexual activity: Not Currently    Comment: 1st intercourse 62 yo-1 partner  Other Topics Concern  . Not on file  Social History Narrative  .  Not on file   Social Determinants of Health   Financial Resource Strain: Not on file  Food Insecurity: Not on file  Transportation Needs: Not on file  Physical Activity: Not on file  Stress: Not on file  Social Connections: Not on file   Past Surgical History:  Procedure Laterality Date  . ABDOMINAL SURGERY     hernia repair  . CHOLECYSTECTOMY  [id number]  . COLONOSCOPY WITH PROPOFOL N/A 07/31/2016   Procedure: COLONOSCOPY WITH PROPOFOL;  Surgeon: Arta Silence, MD;  [facility];  Service: Endoscopy;  Laterality: N/A;  . COLONOSCOPY WITH PROPOFOL N/A 03/08/2020   Procedure: COLONOSCOPY WITH PROPOFOL;  Surgeon: Arta Silence, MD;  [facility];  Service: Endoscopy;  Laterality: N/A;  . FOOT SURGERY Right 06/2007  . IR RADIOLOGIST EVAL & MGMT  12/14/2019  . KNEE ARTHROSCOPY  09/2008   left  . KNEE ARTHROSCOPY  april 2011   left  . NASAL SINUS SURGERY    . NASAL SINUS SURGERY  1990  . POLYPECTOMY  03/08/2020   Procedure: POLYPECTOMY;  Surgeon: Arta Silence, MD;  [facility];  Service: Endoscopy;;  . SHOULDER SURGERY  2004   left   Past Surgical History:  Procedure Laterality Date  . ABDOMINAL SURGERY     hernia repair  .  CHOLECYSTECTOMY  [id number]  . COLONOSCOPY WITH PROPOFOL N/A 07/31/2016   Procedure: COLONOSCOPY WITH PROPOFOL;  Surgeon: Arta Silence, MD;  [facility];  Service: Endoscopy;  Laterality: N/A;  . COLONOSCOPY WITH PROPOFOL N/A 03/08/2020   Procedure: COLONOSCOPY WITH PROPOFOL;  Surgeon: Arta Silence, MD;  [facility];  Service: Endoscopy;  Laterality: N/A;  . FOOT SURGERY Right 06/2007  . IR RADIOLOGIST EVAL & MGMT  12/14/2019  . KNEE ARTHROSCOPY  09/2008   left  . KNEE ARTHROSCOPY  april 2011   left  . NASAL SINUS SURGERY    . NASAL SINUS SURGERY  1990  . POLYPECTOMY  03/08/2020   Procedure: POLYPECTOMY;  Surgeon: Arta Silence, MD;  [facility];  Service: Endoscopy;;  . SHOULDER  SURGERY  2004   left   Past Medical History:  Diagnosis Date  . Anxiety   . Asthma   . 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 Depression   . Diabetes mellitus   . History of pulmonary embolus (PE)   . Hypertension   . Migraines   . Near syncope   . Obesity   . PCOS (polycystic ovarian syndrome)   . Peripheral edema   . Sleep apnea    uses cpap set on 10  . 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    BP (!) 157/83   Pulse 89   Temp (!) 97.4 F (36.3 C)   Ht 5\' 8"  (1.727 m)   Wt (!) 395 lb 12.8 oz (179.5 kg)   SpO2 95%   BMI 60.18 kg/m   Opioid Risk Score:   Fall Risk Score:  `1  Depression screen PHQ 2/9  Depression screen [facility] 2/9 06/13/2020 04/11/2020 02/08/2020 03/30/2018 02/20/2018 01/26/2018 12/05/2017  Decreased Interest 0 0 3 2 0 3 3  Down, Depressed, Hopeless 0 3 3 2  0 3 3  PHQ - 2 Score 0 3 6 4  0 6 6  Altered sleeping - 3 - - - 0 -  Tired, decreased energy - 3 - - - 2 -  Change in appetite - 2 - - - 0 -  Feeling bad or failure about yourself  - 3 - - - 3 -  Trouble concentrating - 3 - - - 0 -  Moving slowly or fidgety/restless - 3 - - - 0 -  Suicidal thoughts - 1 - - - 0 -  PHQ-9 Score - 21 - - - 11 -  Difficult doing work/chores - - - - - Very difficult -    Review of Systems  Musculoskeletal:       Leg pain Knee pain  All other systems reviewed and are negative.      Objective:   Physical Exam Vitals and nursing note reviewed.  Constitutional:      Appearance: Normal appearance. She is obese.  Cardiovascular:     Rate and Rhythm: Normal rate and regular rhythm.     Pulses: Normal pulses.     Heart sounds: Normal heart sounds.  Pulmonary:     Effort: Pulmonary effort is normal.     Breath sounds: Normal breath sounds.  Musculoskeletal:     Cervical back: Normal range of motion and neck supple.     Comments: Normal Muscle Bulk and Muscle Testing Reveals:  Upper Extremities: Full ROM and Muscle Strength 5/5  Lumbar Paraspinal  Tenderness: L-3-L-5 Lower Extremities: Right Lower Extremity: Decreased ROM and Muscle Strength 5/5 Right  Lower Extremity Flexion Produces Pain into her Right Patella Left Lower Extremity: Full ROM and Muscle Strength 5/5 Arises from Table slowly using cane for support Antalgic Gait   Skin:    General: Skin is warm and dry.  Neurological:     Mental Status: She is alert and oriented to person, place, and time.  Psychiatric:        Mood and Affect: Mood normal.        Behavior: Behavior normal.           Assessment & Plan:  1.Spondylosisof Lumbar Region/ Lumbar Facet arthropathy:Lumbar Radiculitis: Continue Gabapentin.11/15/2020 Refilled: Oxycodone 10 mg one tablet every 8 hours as needed #90. Second script e-scribefor the following month. We will continue the opioid monitoring program, this consists of regular clinic visits, examinations, urine drug screen, pill counts as well as use of [place] Controlled Substance Reporting system. A 12 month History has been reviewed on the [place] Controlled Substance Reporting System on 11/15/2020. 2. Morbid obesity: Continue Healthy Diet Regime and HEP.11/15/2020 3. Type II diabetes: Dr. Buddy Duty following.11/15/2020. 4. Reactive Depression: Continue Zoloft.11/15/2020. 5. Myofascial Muscle Pain: Continuecurrent medication regimen withSoma.11/15/2020 6.Right Chronic Knee Pain: Continue HEP as Tolerated. Continue to Monitor.06/13/2020 7. Right Greater Trochanter Bursitis: Continue to alternate Ice and Heat Therapy. Continue to monitor.   F/U in38months

## 2020-11-17 ENCOUNTER — Telehealth: Payer: Self-pay

## 2020-11-17 NOTE — Telephone Encounter (Signed)
Oxy 10mg   was advised that a fax was coming also - her insurance changed from [employer] at year end.  phone number [phone] for pharmacy -[facility] [place] [facility] - needs prior auth per pharmacist

## 2020-11-19 LAB — DRUG TOX MONITOR 1 W/CONF, ORAL FLD
Amphetamines: NEGATIVE ng/mL (ref <10)
Barbiturates: NEGATIVE ng/mL (ref <10)
Benzodiazepines: NEGATIVE ng/mL (ref <0.50)
Buprenorphine: NEGATIVE ng/mL (ref <0.10)
Carisoprodol: 155 ng/mL — ABNORMAL HIGH (ref <2.5)
Cocaine: NEGATIVE ng/mL (ref <5.0)
Codeine: NEGATIVE ng/mL (ref <2.5)
Dihydrocodeine: NEGATIVE ng/mL (ref <2.5)
Fentanyl: NEGATIVE ng/mL (ref <0.10)
Heroin Metabolite: NEGATIVE ng/mL (ref <1.0)
Hydrocodone: NEGATIVE ng/mL (ref <2.5)
Hydromorphone: NEGATIVE ng/mL (ref <2.5)
MARIJUANA: NEGATIVE ng/mL (ref <2.5)
MDMA: NEGATIVE ng/mL (ref <10)
Meprobamate: >250 ng/mL — ABNORMAL HIGH (ref <2.5)
Meprobamate: POSITIVE ng/mL — AB (ref <2.5)
Methadone: NEGATIVE ng/mL (ref <5.0)
Morphine: NEGATIVE ng/mL (ref <2.5)
Nicotine Metabolite: NEGATIVE ng/mL (ref <5.0)
Norhydrocodone: NEGATIVE ng/mL (ref <2.5)
Noroxycodone: 42.1 ng/mL — ABNORMAL HIGH (ref <2.5)
Opiates: POSITIVE ng/mL — AB (ref <2.5)
Oxycodone: 137.5 ng/mL — ABNORMAL HIGH (ref <2.5)
Oxymorphone: NEGATIVE ng/mL (ref <2.5)
Phencyclidine: NEGATIVE ng/mL (ref <10)
Tapentadol: NEGATIVE ng/mL (ref <5.0)
Tramadol: NEGATIVE ng/mL (ref <5.0)
Zolpidem: NEGATIVE ng/mL (ref <5.0)

## 2020-11-19 LAB — DRUG TOX ALC METAB W/CON, ORAL FLD: Alcohol Metabolite: NEGATIVE ng/mL (ref <25)

## 2020-11-21 NOTE — Telephone Encounter (Signed)
Prior auth submitted to covermymeds.  Reply indicated authorization was not required.  Faxed info sheet to pharmacy

## 2020-11-27 ENCOUNTER — Telehealth: Payer: Self-pay | Admitting: *Deleted

## 2020-11-27 DIAGNOSIS — J019 Acute sinusitis, unspecified: Secondary | ICD-10-CM | POA: Diagnosis not present

## 2020-11-27 DIAGNOSIS — R059 Cough, unspecified: Secondary | ICD-10-CM | POA: Diagnosis not present

## 2020-11-27 NOTE — Telephone Encounter (Signed)
Oral swab drug screen was consistent for prescribed medications.  ?

## 2020-11-28 ENCOUNTER — Other Ambulatory Visit: Payer: Self-pay | Admitting: Registered Nurse

## 2020-12-01 DIAGNOSIS — M1711 Unilateral primary osteoarthritis, right knee: Secondary | ICD-10-CM | POA: Diagnosis not present

## 2020-12-05 ENCOUNTER — Other Ambulatory Visit: Payer: Self-pay | Admitting: Family Medicine

## 2020-12-05 DIAGNOSIS — Z1231 Encounter for screening mammogram for malignant neoplasm of breast: Secondary | ICD-10-CM

## 2020-12-13 DIAGNOSIS — R0602 Shortness of breath: Secondary | ICD-10-CM | POA: Diagnosis not present

## 2020-12-13 DIAGNOSIS — J019 Acute sinusitis, unspecified: Secondary | ICD-10-CM | POA: Diagnosis not present

## 2021-01-04 ENCOUNTER — Encounter: Payer: Medicare HMO | Attending: Registered Nurse | Admitting: Registered Nurse

## 2021-01-04 ENCOUNTER — Other Ambulatory Visit: Payer: Self-pay

## 2021-01-04 ENCOUNTER — Encounter: Payer: Self-pay | Admitting: Registered Nurse

## 2021-01-04 VITALS — BP 141/98 | HR 96 | Temp 97.6°F | Ht 68.0 in | Wt >= 6400 oz

## 2021-01-04 DIAGNOSIS — M47816 Spondylosis without myelopathy or radiculopathy, lumbar region: Secondary | ICD-10-CM | POA: Diagnosis not present

## 2021-01-04 DIAGNOSIS — M5416 Radiculopathy, lumbar region: Secondary | ICD-10-CM

## 2021-01-04 DIAGNOSIS — G894 Chronic pain syndrome: Secondary | ICD-10-CM

## 2021-01-04 DIAGNOSIS — Z79891 Long term (current) use of opiate analgesic: Secondary | ICD-10-CM | POA: Diagnosis not present

## 2021-01-04 DIAGNOSIS — M7918 Myalgia, other site: Secondary | ICD-10-CM | POA: Diagnosis not present

## 2021-01-04 DIAGNOSIS — Z5181 Encounter for therapeutic drug level monitoring: Secondary | ICD-10-CM | POA: Diagnosis not present

## 2021-01-04 DIAGNOSIS — M7061 Trochanteric bursitis, right hip: Secondary | ICD-10-CM | POA: Diagnosis not present

## 2021-01-04 MED ORDER — OXYCODONE HCL 10 MG PO TABS: 10.0000 mg | ORAL_TABLET | Freq: Four times a day (QID) | ORAL | 0 refills | Status: DC | PRN

## 2021-01-04 MED ORDER — CARISOPRODOL 350 MG PO TABS: 350.0000 mg | ORAL_TABLET | Freq: Three times a day (TID) | ORAL | 2 refills | Status: DC | PRN

## 2021-01-04 NOTE — Progress Notes (Signed)
Subjective:    Patient ID: Kristen [name], female    DOB: 1966-06-25, 55 y.o.   MRN: [id number]  HPI: Kristen [name] is a 55 y.o. female who returns for follow up appointment for chronic pain and medication refill. She states her pain is located in her lower back and right hip. She rates her pain 7. Her current exercise regime is walking and performing stretching exercises.  Kristen [name] reports a month ago she was walking in her bedroom a month ago and was reaching for something and lost her footing. She states she landed into her recliner and denies falling unto the floor. Since the above she has been experiencing excruciating lower back radicular pain , we will increase her gabapentin to one capsule in the morning, afternoon and two capsules at bedtime. She verbalizes understanding. She also states she was on a steroid pak for the last two months by other providers and her blood sugar was high, she denies steroid pak at this time. We will continue to monitor. She verbalizes understanding. Also states her lower back pain is in the area of bladder stimulator, she was instructed to call her urologist, she verbalizes understanding.   Kristen [name] Morphine equivalent is 60.00 MME.  Last Oral Swab was Performed on 11/15/2020, it was consistent.    Pain Inventory Average Pain 7 Pain Right Now 7 My pain is constant, sharp and stabbing  In the last 24 hours, has pain interfered with the following? General activity 9 Relation with others 10 Enjoyment of life 10 What TIME of day is your pain at its worst? morning , daytime, evening and night Sleep (in general) Poor  Pain is worse with: walking, bending, standing and some activites Pain improves with: rest, heat/ice and medication Relief from Meds: 7  Family History  Problem Relation Age of Onset  . Hypertension Maternal Grandmother   . Stroke Maternal Grandmother   . Diabetes Maternal Grandfather   . Cancer Mother        lung  .  Cancer Father   . Diabetes Sister   . Breast cancer Cousin   . Heart attack Neg Hx    Social History   Socioeconomic History  . Marital status: Single    Spouse name: Not on file  . Number of children: Not on file  . Years of education: Not on file  . Highest education level: Not on file  Occupational History  . Not on file  Tobacco Use  . Smoking status: Never Smoker  . Smokeless tobacco: Never Used  Vaping Use  . Vaping Use: Never used  Substance and Sexual Activity  . Alcohol use: No    Alcohol/week: 0.0 standard drinks  . Drug use: No  . Sexual activity: Not Currently    Comment: 1st intercourse 67 yo-1 partner  Other Topics Concern  . Not on file  Social History Narrative  . Not on file   Social Determinants of Health   Financial Resource Strain: Not on file  Food Insecurity: Not on file  Transportation Needs: Not on file  Physical Activity: Not on file  Stress: Not on file  Social Connections: Not on file   Past Surgical History:  Procedure Laterality Date  . ABDOMINAL SURGERY     hernia repair  . CHOLECYSTECTOMY  [id number]  . COLONOSCOPY WITH PROPOFOL N/A 07/31/2016   Procedure: COLONOSCOPY WITH PROPOFOL;  Surgeon: Arta Silence, MD;  [facility];  Service: Endoscopy;  Laterality: N/A;  .  COLONOSCOPY WITH PROPOFOL N/A 03/08/2020   Procedure: COLONOSCOPY WITH PROPOFOL;  Surgeon: Arta Silence, MD;  [facility];  Service: Endoscopy;  Laterality: N/A;  . FOOT SURGERY Right 06/2007  . IR RADIOLOGIST EVAL & MGMT  12/14/2019  . KNEE ARTHROSCOPY  09/2008   left  . KNEE ARTHROSCOPY  april 2011   left  . NASAL SINUS SURGERY    . NASAL SINUS SURGERY  1990  . POLYPECTOMY  03/08/2020   Procedure: POLYPECTOMY;  Surgeon: Arta Silence, MD;  [facility];  Service: Endoscopy;;  . SHOULDER SURGERY  2004   left   Past Surgical History:  Procedure Laterality Date  . ABDOMINAL SURGERY     hernia repair  . CHOLECYSTECTOMY  [id number]   . COLONOSCOPY WITH PROPOFOL N/A 07/31/2016   Procedure: COLONOSCOPY WITH PROPOFOL;  Surgeon: Arta Silence, MD;  [facility];  Service: Endoscopy;  Laterality: N/A;  . COLONOSCOPY WITH PROPOFOL N/A 03/08/2020   Procedure: COLONOSCOPY WITH PROPOFOL;  Surgeon: Arta Silence, MD;  [facility];  Service: Endoscopy;  Laterality: N/A;  . FOOT SURGERY Right 06/2007  . IR RADIOLOGIST EVAL & MGMT  12/14/2019  . KNEE ARTHROSCOPY  09/2008   left  . KNEE ARTHROSCOPY  april 2011   left  . NASAL SINUS SURGERY    . NASAL SINUS SURGERY  1990  . POLYPECTOMY  03/08/2020   Procedure: POLYPECTOMY;  Surgeon: Arta Silence, MD;  [facility];  Service: Endoscopy;;  . SHOULDER SURGERY  2004   left   Past Medical History:  Diagnosis Date  . Anxiety   . Asthma   . 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 Depression   . Diabetes mellitus   . History of pulmonary embolus (PE)   . Hypertension   . Migraines   . Near syncope   . Obesity   . PCOS (polycystic ovarian syndrome)   . Peripheral edema   . Sleep apnea    uses cpap set on 10  . 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  Opioid Risk Score:   Fall Risk Score:  `1  Depression screen PHQ 2/9  Depression screen [facility] 2/9 06/13/2020 04/11/2020 02/08/2020 03/30/2018 02/20/2018 01/26/2018 12/05/2017  Decreased Interest 0 0 3 2 0 3 3  Down, Depressed, Hopeless 0 3 3 2  0 3 3  PHQ - 2 Score 0 3 6 4  0 6 6  Altered sleeping - 3 - - - 0 -  Tired, decreased energy - 3 - - - 2 -  Change in appetite - 2 - - - 0 -  Feeling bad or failure about yourself  - 3 - - - 3 -  Trouble concentrating - 3 - - - 0 -  Moving slowly or fidgety/restless - 3 - - - 0 -  Suicidal thoughts - 1 - - - 0 -  PHQ-9 Score - 21 - - - 11 -  Difficult doing work/chores - - - - - Very difficult -    Review of Systems  Constitutional: Negative.   HENT: Negative.   Eyes: Negative.   Respiratory:  Negative.   Cardiovascular: Negative.   Gastrointestinal: Negative.   Endocrine: Negative.   Genitourinary: Negative.   Musculoskeletal: Positive for arthralgias, back pain and gait problem.  Skin: Negative.   Allergic/Immunologic: Negative.   Psychiatric/Behavioral: Negative.   All other systems reviewed and  are negative.      Objective:   Physical Exam Vitals and nursing note reviewed.  Constitutional:      Appearance: Normal appearance. She is obese.  Cardiovascular:     Rate and Rhythm: Normal rate and regular rhythm.     Pulses: Normal pulses.     Heart sounds: Normal heart sounds.  Pulmonary:     Effort: Pulmonary effort is normal.     Breath sounds: Normal breath sounds.  Musculoskeletal:     Cervical back: Normal range of motion and neck supple.     Comments: Normal Muscle Bulk and Muscle Testing Reveals:  Upper Extremities: Full ROM and Muscle Strength 5/5  Lumbar Hypersensitivity Right Greater Trochanter Tenderness Lower Extremities : Right: Decrease ROM and Muscle Strength 5/5 Left Lower Extremity: Full ROM and Muscle Strength 5/5 Arises from Table slowly using cane for support Antalgic Gait  Skin:    General: Skin is warm and dry.  Neurological:     Mental Status: She is alert and oriented to person, place, and time.  Psychiatric:        Mood and Affect: Mood normal.        Behavior: Behavior normal.           Assessment & Plan:  1.Spondylosisof Lumbar Region/ Lumbar Facet arthropathy:Lumbar Radiculitis: Increase  Gabapentin Hs dose to two capsules at bedtime.Send a Mychart message next week with update on medication change. She verbalizes understanding. Marland Kitchen03/18/2022 Refilled: Oxycodone 10 mg one tablet every 8 hours as needed #90. Second script e-scribefor the following month. We will continue the opioid monitoring program, this consists of regular clinic visits, examinations, urine drug screen, pill counts as well as use of [place] Controlled  Substance Reporting system. A 12 month History has been reviewed on the [place] Controlled Substance Reporting Systemon 01/05/2021. 2. Morbid obesity: Continue Healthy Diet Regime and HEP.01/05/2021 3. Type II diabetes: Dr. Buddy Duty following.01/05/2021. 4. Reactive Depression: Continue Zoloft.01/05/2021. 5. Myofascial Muscle Pain: Continuecurrent medication regimen withSoma.01/05/2021 6.Right Chronic Knee Pain:No complaints today. Continue HEP as Tolerated. Continue to Monitor.01/05/2021 7. Right Greater Trochanter Bursitis: Continue to alternate Ice and Heat Therapy. Continue to monitor. 01/05/2021  F/U in40months

## 2021-01-04 NOTE — Patient Instructions (Signed)
Take one capsule of Gabapentin in the morning, and  afternoon , take two capsules at bedtime.

## 2021-01-12 ENCOUNTER — Ambulatory Visit: Payer: Medicare HMO | Admitting: Registered Nurse

## 2021-01-15 DIAGNOSIS — Z794 Long term (current) use of insulin: Secondary | ICD-10-CM | POA: Diagnosis not present

## 2021-01-15 DIAGNOSIS — E113293 Type 2 diabetes mellitus with mild nonproliferative diabetic retinopathy without macular edema, bilateral: Secondary | ICD-10-CM | POA: Diagnosis not present

## 2021-01-15 DIAGNOSIS — E1122 Type 2 diabetes mellitus with diabetic chronic kidney disease: Secondary | ICD-10-CM | POA: Diagnosis not present

## 2021-01-15 DIAGNOSIS — E1165 Type 2 diabetes mellitus with hyperglycemia: Secondary | ICD-10-CM | POA: Diagnosis not present

## 2021-01-24 ENCOUNTER — Other Ambulatory Visit: Payer: Self-pay

## 2021-01-24 ENCOUNTER — Ambulatory Visit
Admission: RE | Admit: 2021-01-24 | Discharge: 2021-01-24 | Disposition: A | Discharge status: Home / Self Care | Payer: Medicare HMO | Source: Ambulatory Visit | Attending: Family Medicine | Admitting: Family Medicine

## 2021-01-24 DIAGNOSIS — Z1231 Encounter for screening mammogram for malignant neoplasm of breast: Secondary | ICD-10-CM

## 2021-02-01 DIAGNOSIS — Z794 Long term (current) use of insulin: Secondary | ICD-10-CM | POA: Diagnosis not present

## 2021-02-01 DIAGNOSIS — E1122 Type 2 diabetes mellitus with diabetic chronic kidney disease: Secondary | ICD-10-CM | POA: Diagnosis not present

## 2021-02-10 ENCOUNTER — Other Ambulatory Visit: Payer: Self-pay | Admitting: Registered Nurse

## 2021-02-23 DIAGNOSIS — S76219A Strain of adductor muscle, fascia and tendon of unspecified thigh, initial encounter: Secondary | ICD-10-CM | POA: Diagnosis not present

## 2021-03-06 DIAGNOSIS — N3941 Urge incontinence: Secondary | ICD-10-CM | POA: Diagnosis not present

## 2021-03-08 ENCOUNTER — Encounter: Payer: Medicare HMO | Admitting: Registered Nurse

## 2021-03-12 ENCOUNTER — Encounter: Payer: Medicare HMO | Attending: Registered Nurse | Admitting: Registered Nurse

## 2021-03-12 ENCOUNTER — Other Ambulatory Visit: Payer: Self-pay

## 2021-03-12 ENCOUNTER — Encounter: Payer: Self-pay | Admitting: Registered Nurse

## 2021-03-12 VITALS — BP 166/97 | HR 86 | Temp 97.7°F | Ht 68.0 in | Wt 395.0 lb

## 2021-03-12 DIAGNOSIS — Z5181 Encounter for therapeutic drug level monitoring: Secondary | ICD-10-CM | POA: Diagnosis not present

## 2021-03-12 DIAGNOSIS — M47816 Spondylosis without myelopathy or radiculopathy, lumbar region: Secondary | ICD-10-CM

## 2021-03-12 DIAGNOSIS — G894 Chronic pain syndrome: Secondary | ICD-10-CM

## 2021-03-12 DIAGNOSIS — M1712 Unilateral primary osteoarthritis, left knee: Secondary | ICD-10-CM | POA: Diagnosis not present

## 2021-03-12 DIAGNOSIS — Z79891 Long term (current) use of opiate analgesic: Secondary | ICD-10-CM | POA: Diagnosis not present

## 2021-03-12 DIAGNOSIS — M1711 Unilateral primary osteoarthritis, right knee: Secondary | ICD-10-CM | POA: Diagnosis not present

## 2021-03-12 MED ORDER — OXYCODONE HCL 10 MG PO TABS: 10.0000 mg | ORAL_TABLET | Freq: Four times a day (QID) | ORAL | 0 refills | Status: DC | PRN

## 2021-03-12 NOTE — Progress Notes (Signed)
Subjective:    Patient ID: Kristen [name], female    DOB: October 21, 1966, 55 y.o.   MRN: [id number]  HPI: Kristen [name] is a 55 y.o. female who returns for follow up appointment for chronic pain and medication refill. She states her pain is located in her lower back radiating into her left lower extremity and bilateral knee pain. She rates her pain 7. Her  current exercise regime is walking and performing stretching exercises.  Kristen [name] Morphine equivalent is 60.00 MME.  Oral Swab was Performed today.     Pain Inventory Average Pain 7 Pain Right Now 7 My pain is constant, sharp, stabbing and aching  In the last 24 hours, has pain interfered with the following? General activity 7 Relation with others 10 Enjoyment of life 10 What TIME of day is your pain at its worst? morning , daytime, evening and night Sleep (in general) Poor  Pain is worse with: walking, bending, standing and some activites Pain improves with: rest, heat/ice and medication Relief from Meds: 7  Family History  Problem Relation Age of Onset  . Hypertension Maternal Grandmother   . Stroke Maternal Grandmother   . Diabetes Maternal Grandfather   . Cancer Mother        lung  . Cancer Father   . Diabetes Sister   . Breast cancer Cousin   . Heart attack Neg Hx    Social History   Socioeconomic History  . Marital status: Single    Spouse name: Not on file  . Number of children: Not on file  . Years of education: Not on file  . Highest education level: Not on file  Occupational History  . Not on file  Tobacco Use  . Smoking status: Never Smoker  . Smokeless tobacco: Never Used  Vaping Use  . Vaping Use: Never used  Substance and Sexual Activity  . Alcohol use: No    Alcohol/week: 0.0 standard drinks  . Drug use: No  . Sexual activity: Not Currently    Comment: 1st intercourse 81 yo-1 partner  Other Topics Concern  . Not on file  Social History Narrative  . Not on file   Social  Determinants of Health   Financial Resource Strain: Not on file  Food Insecurity: Not on file  Transportation Needs: Not on file  Physical Activity: Not on file  Stress: Not on file  Social Connections: Not on file   Past Surgical History:  Procedure Laterality Date  . ABDOMINAL SURGERY     hernia repair  . CHOLECYSTECTOMY  [id number]  . COLONOSCOPY WITH PROPOFOL N/A 07/31/2016   Procedure: COLONOSCOPY WITH PROPOFOL;  Surgeon: Arta Silence, MD;  [facility];  Service: Endoscopy;  Laterality: N/A;  . COLONOSCOPY WITH PROPOFOL N/A 03/08/2020   Procedure: COLONOSCOPY WITH PROPOFOL;  Surgeon: Arta Silence, MD;  [facility];  Service: Endoscopy;  Laterality: N/A;  . FOOT SURGERY Right 06/2007  . IR RADIOLOGIST EVAL & MGMT  12/14/2019  . KNEE ARTHROSCOPY  09/2008   left  . KNEE ARTHROSCOPY  april 2011   left  . NASAL SINUS SURGERY    . NASAL SINUS SURGERY  1990  . POLYPECTOMY  03/08/2020   Procedure: POLYPECTOMY;  Surgeon: Arta Silence, MD;  [facility];  Service: Endoscopy;;  . SHOULDER SURGERY  2004   left   Past Surgical History:  Procedure Laterality Date  . ABDOMINAL SURGERY     hernia repair  . CHOLECYSTECTOMY  [id number]  .  COLONOSCOPY WITH PROPOFOL N/A 07/31/2016   Procedure: COLONOSCOPY WITH PROPOFOL;  Surgeon: Arta Silence, MD;  [facility];  Service: Endoscopy;  Laterality: N/A;  . COLONOSCOPY WITH PROPOFOL N/A 03/08/2020   Procedure: COLONOSCOPY WITH PROPOFOL;  Surgeon: Arta Silence, MD;  [facility];  Service: Endoscopy;  Laterality: N/A;  . FOOT SURGERY Right 06/2007  . IR RADIOLOGIST EVAL & MGMT  12/14/2019  . KNEE ARTHROSCOPY  09/2008   left  . KNEE ARTHROSCOPY  april 2011   left  . NASAL SINUS SURGERY    . NASAL SINUS SURGERY  1990  . POLYPECTOMY  03/08/2020   Procedure: POLYPECTOMY;  Surgeon: Arta Silence, MD;  [facility];  Service: Endoscopy;;  . SHOULDER SURGERY  2004   left    Past Medical History:  Diagnosis Date  . Anxiety   . Asthma   . 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 Depression   . Diabetes mellitus   . History of pulmonary embolus (PE)   . Hypertension   . Migraines   . Near syncope   . Obesity   . PCOS (polycystic ovarian syndrome)   . Peripheral edema   . Sleep apnea    uses cpap set on 10  . 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    BP (!) 166/97   Pulse 86   Temp 97.7 F (36.5 C)   Ht 5\' 8"  (1.727 m)   Wt (!) 395 lb (179.2 kg)   SpO2 95%   BMI 60.06 kg/m   Opioid Risk Score:   Fall Risk Score:  `1  Depression screen PHQ 2/9  Depression screen [facility] 2/9 06/13/2020 04/11/2020 02/08/2020 03/30/2018 02/20/2018 01/26/2018 12/05/2017  Decreased Interest 0 0 3 2 0 3 3  Down, Depressed, Hopeless 0 3 3 2  0 3 3  PHQ - 2 Score 0 3 6 4  0 6 6  Altered sleeping - 3 - - - 0 -  Tired, decreased energy - 3 - - - 2 -  Change in appetite - 2 - - - 0 -  Feeling bad or failure about yourself  - 3 - - - 3 -  Trouble concentrating - 3 - - - 0 -  Moving slowly or fidgety/restless - 3 - - - 0 -  Suicidal thoughts - 1 - - - 0 -  PHQ-9 Score - 21 - - - 11 -  Difficult doing work/chores - - - - - Very difficult -    Review of Systems  Musculoskeletal: Positive for back pain.       Objective:   Physical Exam Vitals and nursing note reviewed.  Constitutional:      Appearance: Normal appearance. She is obese.  Cardiovascular:     Rate and Rhythm: Normal rate and regular rhythm.     Pulses: Normal pulses.     Heart sounds: Normal heart sounds.  Pulmonary:     Effort: Pulmonary effort is normal.     Breath sounds: Normal breath sounds.  Musculoskeletal:     Cervical back: Normal range of motion and neck supple.     Comments: Normal Muscle Bulk and Muscle Testing Reveals:  Upper Extremities: Full ROM and Muscle Strength 5/5 Lumbar Paraspinal Tenderness: L-3-L-5 Lower Extremities: Full ROM and Muscle Strength 5/5 Arises from Table  Slowly Narrow Based  Gait   Skin:    General: Skin is warm and dry.  Neurological:  Mental Status: She is alert and oriented to person, place, and time.  Psychiatric:        Mood and Affect: Mood normal.        Behavior: Behavior normal.           Assessment & Plan:  1.Spondylosisof Lumbar Region/ Lumbar Facet arthropathy:Lumbar Radiculitis: Increase  Gabapentin Hs dose to two capsules at bedtime.Send a Mychart message next week with update on medication change. She verbalizes understanding. Marland Kitchen05/23/2022 Refilled: Oxycodone 10 mg one tablet every 8 hours as needed #90. Second script e-scribefor the following month. We will continue the opioid monitoring program, this consists of regular clinic visits, examinations, urine drug screen, pill counts as well as use of [place] Controlled Substance Reporting system. A 12 month History has been reviewed on the [place] Controlled Substance Reporting Systemon 03/12/2021. 2. Morbid obesity: Continue Healthy Diet Regime and HEP.03/12/2021 3. Type II diabetes: Dr. Buddy Duty following.03/12/2021. 4. Reactive Depression: Continue Zoloft.03/12/2021. 5. Myofascial Muscle Pain: Continuecurrent medication regimen withSoma.03/12/2021 6. Bilateral Chronic Knee Pain:Continue HEP as Tolerated. Continue to Monitor.03/12/2021 7. Right Greater Trochanter Bursitis: No complaints today. Continue to alternate Ice and Heat Therapy. Continue to monitor.03/12/2021  F/U in55months

## 2021-03-27 LAB — SPECIMEN STATUS REPORT

## 2021-03-29 DIAGNOSIS — N3941 Urge incontinence: Secondary | ICD-10-CM | POA: Diagnosis not present

## 2021-04-16 ENCOUNTER — Telehealth: Payer: Self-pay | Admitting: *Deleted

## 2021-04-16 DIAGNOSIS — M47816 Spondylosis without myelopathy or radiculopathy, lumbar region: Secondary | ICD-10-CM

## 2021-04-16 MED ORDER — OXYCODONE HCL 10 MG PO TABS: 10.0000 mg | ORAL_TABLET | Freq: Four times a day (QID) | ORAL | 0 refills | Status: DC | PRN

## 2021-04-16 NOTE — Telephone Encounter (Signed)
Ms Kristen [name] called and is requesting a refill on her oxycodone.

## 2021-04-16 NOTE — Telephone Encounter (Signed)
PMP was Reviewed. Oxycodone e-scribed today. Kristen [name] has a scheduled appointment next month.Placed a call to Kristen [name] regarding the above, she verbalizes understanding.

## 2021-05-10 ENCOUNTER — Other Ambulatory Visit: Payer: Self-pay

## 2021-05-10 ENCOUNTER — Encounter: Payer: Self-pay | Admitting: Registered Nurse

## 2021-05-10 ENCOUNTER — Encounter: Payer: Medicare HMO | Attending: Registered Nurse | Admitting: Registered Nurse

## 2021-05-10 VITALS — BP 144/76 | HR 81 | Temp 98.7°F | Ht 68.0 in | Wt 397.8 lb

## 2021-05-10 DIAGNOSIS — M1712 Unilateral primary osteoarthritis, left knee: Secondary | ICD-10-CM | POA: Diagnosis not present

## 2021-05-10 DIAGNOSIS — G894 Chronic pain syndrome: Secondary | ICD-10-CM

## 2021-05-10 DIAGNOSIS — M47816 Spondylosis without myelopathy or radiculopathy, lumbar region: Secondary | ICD-10-CM | POA: Diagnosis not present

## 2021-05-10 DIAGNOSIS — Z79891 Long term (current) use of opiate analgesic: Secondary | ICD-10-CM | POA: Diagnosis not present

## 2021-05-10 DIAGNOSIS — Z5181 Encounter for therapeutic drug level monitoring: Secondary | ICD-10-CM

## 2021-05-10 DIAGNOSIS — M1711 Unilateral primary osteoarthritis, right knee: Secondary | ICD-10-CM | POA: Diagnosis not present

## 2021-05-10 MED ORDER — OXYCODONE HCL 10 MG PO TABS: 10.0000 mg | ORAL_TABLET | Freq: Four times a day (QID) | ORAL | 0 refills | Status: DC | PRN

## 2021-05-10 NOTE — Progress Notes (Signed)
Subjective:    Patient ID: Kristen [name], female    DOB: [birth year], 55 y.o.   MRN: [id number]  FIE:[id number] Kristen [name] is a 55 y.o. female who returns for follow up appointment for chronic pain and medication refill. She states her pain is located in  her lower back pain and bilateral knee pain L>R. She rates her pain 7.  Her current exercise regime is walking and performing stretching exercises.  Ms. Diniz Morphine equivalent is 60.00  MME.   UDS ordered today.     Pain Inventory Average Pain 7 Pain Right Now 7 My pain is constant  In the last 24 hours, has pain interfered with the following? General activity 8 Relation with others 8 Enjoyment of life 8 What TIME of day is your pain at its worst? morning , daytime, and evening Sleep (in general) Poor  Pain is worse with: walking, bending, standing, and some activites Pain improves with: rest, heat/ice, and medication Relief from Meds: 6  Family History  Problem Relation Age of Onset   Hypertension Maternal Grandmother    Stroke Maternal Grandmother    Diabetes Maternal Grandfather    Cancer Mother        lung   Cancer Father    Diabetes Sister    Breast cancer Cousin    Heart attack Neg Hx    Social History   Socioeconomic History   Marital status: Single    Spouse name: Not on file   Number of children: Not on file   Years of education: Not on file   Highest education level: Not on file  Occupational History   Not on file  Tobacco Use   Smoking status: Never   Smokeless tobacco: Never  Vaping Use   Vaping Use: Never used  Substance and Sexual Activity   Alcohol use: No    Alcohol/week: 0.0 standard drinks   Drug use: No   Sexual activity: Not Currently    Comment: 1st intercourse 80 yo-1 partner  Other Topics Concern   Not on file  Social History Narrative   Not on file   Social Determinants of Health   Financial Resource Strain: Not on file  Food Insecurity: Not on file  Transportation  Needs: Not on file  Physical Activity: Not on file  Stress: Not on file  Social Connections: Not on file   Past Surgical History:  Procedure Laterality Date   ABDOMINAL SURGERY     hernia repair   CHOLECYSTECTOMY  [id number]   COLONOSCOPY WITH PROPOFOL N/A 07/31/2016   Procedure: COLONOSCOPY WITH PROPOFOL;  Surgeon: Arta Silence, MD;  [facility];  Service: Endoscopy;  Laterality: N/A;   COLONOSCOPY WITH PROPOFOL N/A 03/08/2020   Procedure: COLONOSCOPY WITH PROPOFOL;  Surgeon: Arta Silence, MD;  [facility];  Service: Endoscopy;  Laterality: N/A;   FOOT SURGERY Right 06/2007   IR RADIOLOGIST EVAL & MGMT  12/14/2019   KNEE ARTHROSCOPY  09/2008   left   KNEE ARTHROSCOPY  april 2011   left   NASAL SINUS SURGERY     NASAL SINUS SURGERY  1990   POLYPECTOMY  03/08/2020   Procedure: POLYPECTOMY;  Surgeon: Arta Silence, MD;  [facility];  Service: Endoscopy;;   SHOULDER SURGERY  2004   left   Past Surgical History:  Procedure Laterality Date   ABDOMINAL SURGERY     hernia repair   CHOLECYSTECTOMY  [id number]   COLONOSCOPY WITH PROPOFOL N/A 07/31/2016   Procedure: COLONOSCOPY  WITH PROPOFOL;  Surgeon: Arta Silence, MD;  [facility];  Service: Endoscopy;  Laterality: N/A;   COLONOSCOPY WITH PROPOFOL N/A 03/08/2020   Procedure: COLONOSCOPY WITH PROPOFOL;  Surgeon: Arta Silence, MD;  [facility];  Service: Endoscopy;  Laterality: N/A;   FOOT SURGERY Right 06/2007   IR RADIOLOGIST EVAL & MGMT  12/14/2019   KNEE ARTHROSCOPY  09/2008   left   KNEE ARTHROSCOPY  april 2011   left   NASAL SINUS SURGERY     NASAL SINUS SURGERY  1990   POLYPECTOMY  03/08/2020   Procedure: POLYPECTOMY;  Surgeon: Arta Silence, MD;  [facility];  Service: Endoscopy;;   SHOULDER SURGERY  2004   left   Past Medical History:  Diagnosis Date   Anxiety    Asthma    Degenerative arthritis    in back   Depression    Diabetes mellitus     History of pulmonary embolus (PE)    Hypertension    Migraines    Near syncope    Obesity    PCOS (polycystic ovarian syndrome)    Peripheral edema    Sleep apnea    uses cpap set on 10   Stroke ([place]) 12/2011   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    BP (!) 144/76 (BP Location: Right Arm)   Pulse 81   Temp 98.7 F (37.1 C) (Oral)   Ht 5\' 8"  (1.727 m)   Wt (!) 397 lb 12.8 oz (180.4 kg)   SpO2 96%   BMI 60.49 kg/m   Opioid Risk Score:   Fall Risk Score:  `1  Depression screen PHQ 2/9  Depression screen [facility] 2/9 06/13/2020 04/11/2020 02/08/2020 03/30/2018 02/20/2018 01/26/2018 12/05/2017  Decreased Interest 0 0 3 2 0 3 3  Down, Depressed, Hopeless 0 3 3 2  0 3 3  PHQ - 2 Score 0 3 6 4  0 6 6  Altered sleeping - 3 - - - 0 -  Tired, decreased energy - 3 - - - 2 -  Change in appetite - 2 - - - 0 -  Feeling bad or failure about yourself  - 3 - - - 3 -  Trouble concentrating - 3 - - - 0 -  Moving slowly or fidgety/restless - 3 - - - 0 -  Suicidal thoughts - 1 - - - 0 -  PHQ-9 Score - 21 - - - 11 -  Difficult doing work/chores - - - - - Very difficult -     Review of Systems  Constitutional: Negative.   HENT: Negative.    Eyes: Negative.   Respiratory: Negative.    Cardiovascular: Negative.   Gastrointestinal: Negative.   Endocrine: Negative.   Genitourinary: Negative.   Musculoskeletal:  Positive for back pain and gait problem.  Skin: Negative.   Allergic/Immunologic: Negative.   Hematological: Negative.   Psychiatric/Behavioral: Negative.        Objective:   Physical Exam Vitals and nursing note reviewed.  Constitutional:      Appearance: Normal appearance.  Cardiovascular:     Rate and Rhythm: Normal rate and regular rhythm.     Pulses: Normal pulses.     Heart sounds: Normal heart sounds.  Pulmonary:     Effort: Pulmonary effort is normal.     Breath sounds: Normal breath sounds.  Musculoskeletal:     Cervical back: Normal range of motion and neck supple.     Comments:  Normal Muscle Bulk and  Muscle Testing Reveals:  Upper Extremities: Full ROM and Muscle Strength 5/5 Lumbar Paraspinal Tenderness: L-3-L-5 Lower Extremities: Full ROM and Muscle Strength 5/5 Left Lower Extremity Flexion Produces Pain into her Left Patella Arises from Table slowly Narrow Based  Gait     Skin:    General: Skin is warm and dry.  Neurological:     Mental Status: She is alert and oriented to person, place, and time.  Psychiatric:        Mood and Affect: Mood normal.        Behavior: Behavior normal.         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 She verbalizes understanding. Marland Kitchen07/21/2022 Refilled: Oxycodone 10 mg one tablet every 8 hours as needed #90.  Second script e-scribe for the following month. We will continue the opioid monitoring program, this consists of regular clinic visits, examinations, urine drug screen, pill counts as well as use of [place] Controlled Substance Reporting system. A 12 month History has been reviewed on the [place] Controlled Substance Reporting System on 05/10/2021. 2. Morbid obesity: Continue Healthy Diet Regime and HEP. 05/10/2021 3. Type II diabetes:  Dr. Buddy Duty following. 05/10/2021. 4. Reactive Depression: Continue Zoloft. 05/10/2021. 5. Myofascial Muscle Pain: Continue current medication regimen with Soma. 05/10/2021 6. Bilateral  Chronic Knee Pain:  Continue HEP as Tolerated. Continue to Monitor. 05/10/2021 7. Right Greater Trochanter Bursitis: No complaints today. Continue to alternate Ice and Heat Therapy. Continue to monitor. 05/10/2021   F/U in 2 months

## 2021-05-10 NOTE — Addendum Note (Signed)
Addended by: Caro Hight on: 05/10/2021 08:13 AM   Modules accepted: Orders

## 2021-05-18 LAB — DRUG TOX MONITOR 1 W/CONF, ORAL FLD
Amphetamines: NEGATIVE ng/mL (ref <10)
Barbiturates: NEGATIVE ng/mL (ref <10)
Benzodiazepines: NEGATIVE ng/mL (ref <0.50)
Buprenorphine: NEGATIVE ng/mL (ref <0.10)
Carisoprodol: 108.8 ng/mL — ABNORMAL HIGH (ref <2.5)
Cocaine: NEGATIVE ng/mL (ref <5.0)
Codeine: NEGATIVE ng/mL (ref <2.5)
Dihydrocodeine: NEGATIVE ng/mL (ref <2.5)
Fentanyl: NEGATIVE ng/mL (ref <0.10)
Heroin Metabolite: NEGATIVE ng/mL (ref <1.0)
Hydrocodone: NEGATIVE ng/mL (ref <2.5)
Hydromorphone: NEGATIVE ng/mL (ref <2.5)
MARIJUANA: NEGATIVE ng/mL (ref <2.5)
MDMA: NEGATIVE ng/mL (ref <10)
Meprobamate: >250 ng/mL — ABNORMAL HIGH (ref <2.5)
Meprobamate: POSITIVE ng/mL — AB (ref <2.5)
Methadone: NEGATIVE ng/mL (ref <5.0)
Morphine: NEGATIVE ng/mL (ref <2.5)
Nicotine Metabolite: NEGATIVE ng/mL (ref <5.0)
Norhydrocodone: NEGATIVE ng/mL (ref <2.5)
Noroxycodone: 49.1 ng/mL — ABNORMAL HIGH (ref <2.5)
Opiates: POSITIVE ng/mL — AB (ref <2.5)
Oxycodone: 213.9 ng/mL — ABNORMAL HIGH (ref <2.5)
Oxymorphone: NEGATIVE ng/mL (ref <2.5)
Phencyclidine: NEGATIVE ng/mL (ref <10)
Tapentadol: NEGATIVE ng/mL (ref <5.0)
Tramadol: NEGATIVE ng/mL (ref <5.0)
Zolpidem: NEGATIVE ng/mL (ref <5.0)

## 2021-05-18 LAB — DRUG TOX ALC METAB W/CON, ORAL FLD: Alcohol Metabolite: NEGATIVE ng/mL (ref <25)

## 2021-05-22 ENCOUNTER — Telehealth: Payer: Self-pay | Admitting: *Deleted

## 2021-05-22 NOTE — Telephone Encounter (Signed)
Oral swab drug screen was consistent for prescribed medications.  ?

## 2021-06-15 ENCOUNTER — Other Ambulatory Visit: Payer: Self-pay | Admitting: Registered Nurse

## 2021-06-18 NOTE — Telephone Encounter (Signed)
PMP was Reviewed: Note was Reviewed and  Soma e-scribed today.

## 2021-07-09 DIAGNOSIS — H468 Other optic neuritis: Secondary | ICD-10-CM | POA: Diagnosis not present

## 2021-07-10 ENCOUNTER — Encounter: Payer: Self-pay | Admitting: Registered Nurse

## 2021-07-10 ENCOUNTER — Encounter: Payer: Medicare HMO | Attending: Registered Nurse | Admitting: Registered Nurse

## 2021-07-10 ENCOUNTER — Other Ambulatory Visit: Payer: Self-pay

## 2021-07-10 VITALS — BP 159/92 | HR 80 | Temp 97.5°F | Ht 68.0 in | Wt 399.6 lb

## 2021-07-10 DIAGNOSIS — Z79891 Long term (current) use of opiate analgesic: Secondary | ICD-10-CM | POA: Insufficient documentation

## 2021-07-10 DIAGNOSIS — M170 Bilateral primary osteoarthritis of knee: Secondary | ICD-10-CM

## 2021-07-10 DIAGNOSIS — M4726 Other spondylosis with radiculopathy, lumbar region: Secondary | ICD-10-CM

## 2021-07-10 DIAGNOSIS — M5416 Radiculopathy, lumbar region: Secondary | ICD-10-CM

## 2021-07-10 DIAGNOSIS — Z5181 Encounter for therapeutic drug level monitoring: Secondary | ICD-10-CM | POA: Diagnosis not present

## 2021-07-10 DIAGNOSIS — M1712 Unilateral primary osteoarthritis, left knee: Secondary | ICD-10-CM | POA: Diagnosis not present

## 2021-07-10 DIAGNOSIS — M47816 Spondylosis without myelopathy or radiculopathy, lumbar region: Secondary | ICD-10-CM | POA: Diagnosis not present

## 2021-07-10 DIAGNOSIS — M7061 Trochanteric bursitis, right hip: Secondary | ICD-10-CM

## 2021-07-10 DIAGNOSIS — M1711 Unilateral primary osteoarthritis, right knee: Secondary | ICD-10-CM | POA: Diagnosis not present

## 2021-07-10 DIAGNOSIS — G894 Chronic pain syndrome: Secondary | ICD-10-CM | POA: Insufficient documentation

## 2021-07-10 MED ORDER — OXYCODONE HCL 10 MG PO TABS: 10.0000 mg | ORAL_TABLET | Freq: Four times a day (QID) | ORAL | 0 refills | Status: DC | PRN

## 2021-07-10 NOTE — Progress Notes (Signed)
Subjective:    Patient ID: Kristen [name], female    DOB: Dec 24, 1965, 55 y.o.   MRN: [id number]  HPI: Kristen [name] is a 55 y.o. female who returns for follow up appointment for chronic pain and medication refill. She states her pain is located in  her lower back pain radiating into her right hip and right lower extremity. She also reports bilateral knee pain. She rates her pain 7. Her current exercise regime is walking and performing stretching exercises.  Kristen [name] reports her ophthalmologist recommends for her to stop driving at this time due to lost of vision. Will place a referral for transportation assistance, she verbalizes understanding and very appreciative.     Kristen [name] Morphine equivalent is 60.00 MME.   Last Oral Swab was Performed on 05/10/2021, it was consistent.   Pain Inventory Average Pain 7 Pain Right Now 7 My pain is burning, stabbing, and aching  In the last 24 hours, has pain interfered with the following? General activity 9 Relation with others 8 Enjoyment of life 8 What TIME of day is your pain at its worst? morning , daytime, evening, and night Sleep (in general) Poor  Pain is worse with: walking, bending, standing, and some activites Pain improves with: rest, heat/ice, and medication Relief from Meds: 6  Family History  Problem Relation Age of Onset   Hypertension Maternal Grandmother    Stroke Maternal Grandmother    Diabetes Maternal Grandfather    Cancer Mother        lung   Cancer Father    Diabetes Sister    Breast cancer Cousin    Heart attack Neg Hx    Social History   Socioeconomic History   Marital status: Single    Spouse name: Not on file   Number of children: Not on file   Years of education: Not on file   Highest education level: Not on file  Occupational History   Not on file  Tobacco Use   Smoking status: Never   Smokeless tobacco: Never  Vaping Use   Vaping Use: Never used  Substance and Sexual Activity    Alcohol use: No    Alcohol/week: 0.0 standard drinks   Drug use: No   Sexual activity: Not Currently    Comment: 1st intercourse 82 yo-1 partner  Other Topics Concern   Not on file  Social History Narrative   Not on file   Social Determinants of Health   Financial Resource Strain: Not on file  Food Insecurity: Not on file  Transportation Needs: Not on file  Physical Activity: Not on file  Stress: Not on file  Social Connections: Not on file   Past Surgical History:  Procedure Laterality Date   ABDOMINAL SURGERY     hernia repair   CHOLECYSTECTOMY  [id number]   COLONOSCOPY WITH PROPOFOL N/A 07/31/2016   Procedure: COLONOSCOPY WITH PROPOFOL;  Surgeon: Arta Silence, MD;  Location: [facility];  Service: Endoscopy;  Laterality: N/A;   COLONOSCOPY WITH PROPOFOL N/A 03/08/2020   Procedure: COLONOSCOPY WITH PROPOFOL;  Surgeon: Arta Silence, MD;  Location: [facility];  Service: Endoscopy;  Laterality: N/A;   FOOT SURGERY Right 06/2007   IR RADIOLOGIST EVAL & MGMT  12/14/2019   KNEE ARTHROSCOPY  09/2008   left   KNEE ARTHROSCOPY  april 2011   left   NASAL SINUS SURGERY     NASAL SINUS SURGERY  1990   POLYPECTOMY  03/08/2020   Procedure: POLYPECTOMY;  Surgeon:  Arta Silence, MD;  Location: [facility];  Service: Endoscopy;;   SHOULDER SURGERY  2004   left   Past Surgical History:  Procedure Laterality Date   ABDOMINAL SURGERY     hernia repair   CHOLECYSTECTOMY  [id number]   COLONOSCOPY WITH PROPOFOL N/A 07/31/2016   Procedure: COLONOSCOPY WITH PROPOFOL;  Surgeon: Arta Silence, MD;  [facility];  Service: Endoscopy;  Laterality: N/A;   COLONOSCOPY WITH PROPOFOL N/A 03/08/2020   Procedure: COLONOSCOPY WITH PROPOFOL;  Surgeon: Arta Silence, MD;  [facility];  Service: Endoscopy;  Laterality: N/A;   FOOT SURGERY Right 06/2007   IR RADIOLOGIST EVAL & MGMT  12/14/2019   KNEE ARTHROSCOPY  09/2008   left   KNEE ARTHROSCOPY  april 2011   left   NASAL  SINUS SURGERY     NASAL SINUS SURGERY  1990   POLYPECTOMY  03/08/2020   Procedure: POLYPECTOMY;  Surgeon: Arta Silence, MD;  [facility];  Service: Endoscopy;;   SHOULDER SURGERY  2004   left   Past Medical History:  Diagnosis Date   Anxiety    Asthma    Degenerative arthritis    in back   Depression    Diabetes mellitus    History of pulmonary embolus (PE)    Hypertension    Migraines    Near syncope    Obesity    PCOS (polycystic ovarian syndrome)    Peripheral edema    Sleep apnea    uses cpap set on 10   Stroke ([place]) 12/2011   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    BP (!) 170/95   Pulse 88   Temp (!) 97.5 F (36.4 C)   Ht 5\' 8"  (1.727 m)   Wt (!) 399 lb 9.6 oz (181.3 kg)   SpO2 97%   BMI 60.76 kg/m   Opioid Risk Score:   Fall Risk Score:  `1  Depression screen PHQ 2/9  Depression screen [facility] 2/9 07/10/2021 05/10/2021 06/13/2020 04/11/2020 02/08/2020 03/30/2018 02/20/2018  Decreased Interest 3 1 0 0 3 2 0  Down, Depressed, Hopeless 3 1 0 3 3 2  0  PHQ - 2 Score 6 2 0 3 6 4  0  Altered sleeping - 1 - 3 - - -  Tired, decreased energy - 1 - 3 - - -  Change in appetite - 1 - 2 - - -  Feeling bad or failure about yourself  - 1 - 3 - - -  Trouble concentrating - 1 - 3 - - -  Moving slowly or fidgety/restless - 1 - 3 - - -  Suicidal thoughts - 1 - 1 - - -  PHQ-9 Score - 9 - 21 - - -  Difficult doing work/chores - - - - - - -     Review of Systems  Constitutional: Negative.   HENT: Negative.    Eyes: Negative.   Respiratory: Negative.    Cardiovascular: Negative.   Gastrointestinal: Negative.   Endocrine: Negative.   Genitourinary:  Positive for enuresis and frequency.  Musculoskeletal:  Positive for back pain.  Skin: Negative.   Allergic/Immunologic: Negative.   Neurological: Negative.   Hematological: Negative.   Psychiatric/Behavioral:  Positive for dysphoric mood.   All other systems reviewed and are negative.     Objective:   Physical  Exam Vitals and nursing note reviewed.  Constitutional:      Appearance: Normal appearance.  Cardiovascular:     Rate and Rhythm: Normal  rate and regular rhythm.     Pulses: Normal pulses.     Heart sounds: Normal heart sounds.  Pulmonary:     Effort: Pulmonary effort is normal.     Breath sounds: Normal breath sounds.  Musculoskeletal:     Cervical back: Normal range of motion and neck supple.     Comments: Normal Muscle Bulk and Muscle Testing Reveals:  Upper Extremities: Full ROM and Muscle Strength 5/5 Lumbar Paraspinal Tenderness: L-4-L-5 Right Greater Trochanter Tenderness Lower Extremities: Full ROM and Muscle Strength 5/5 Arises from Table Slowly Antalgic  Gait     Skin:    General: Skin is warm and dry.  Neurological:     Mental Status: She is alert and oriented to person, place, and time.  Psychiatric:        Mood and Affect: Mood normal.        Behavior: Behavior normal.         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 She verbalizes understanding. Marland Kitchen09/20/2022 Refilled: Oxycodone 10 mg one tablet every 8 hours as needed #90.  Second script e-scribe for the following month. We will continue the opioid monitoring program, this consists of regular clinic visits, examinations, urine drug screen, pill counts as well as use of [place] Controlled Substance Reporting system. A 12 month History has been reviewed on the [place] Controlled Substance Reporting System on 07/10/2021. 2. Morbid obesity: Continue Healthy Diet Regime and HEP. 07/10/2021 3. Type II diabetes:  Dr. Buddy Duty following. 07/10/2021. 4. Reactive Depression: Continue Zoloft. 07/10/2021. 5. Myofascial Muscle Pain: Continue current medication regimen with Soma. 07/10/2021 6. Bilateral  Chronic Knee Pain:  Continue HEP as  Tolerated. Continue to Monitor. 07/10/2021 7. Right Greater Trochanter Bursitis:  Continue to alternate Ice and Heat Therapy. Continue to monitor. 07/10/2021   F/U in 2 months

## 2021-07-13 ENCOUNTER — Other Ambulatory Visit (HOSPITAL_COMMUNITY): Payer: Self-pay | Admitting: Family Medicine

## 2021-07-13 DIAGNOSIS — Z23 Encounter for immunization: Secondary | ICD-10-CM | POA: Diagnosis not present

## 2021-07-13 DIAGNOSIS — R600 Localized edema: Secondary | ICD-10-CM | POA: Diagnosis not present

## 2021-07-13 DIAGNOSIS — M79662 Pain in left lower leg: Secondary | ICD-10-CM

## 2021-07-13 DIAGNOSIS — L03116 Cellulitis of left lower limb: Secondary | ICD-10-CM | POA: Diagnosis not present

## 2021-07-13 DIAGNOSIS — M7989 Other specified soft tissue disorders: Secondary | ICD-10-CM

## 2021-07-13 DIAGNOSIS — F324 Major depressive disorder, single episode, in partial remission: Secondary | ICD-10-CM | POA: Diagnosis not present

## 2021-07-16 ENCOUNTER — Ambulatory Visit (HOSPITAL_COMMUNITY): Admission: RE | Admit: 2021-07-16 | Payer: Medicare HMO | Source: Ambulatory Visit

## 2021-07-19 DIAGNOSIS — L03116 Cellulitis of left lower limb: Secondary | ICD-10-CM | POA: Diagnosis not present

## 2021-07-19 DIAGNOSIS — J45909 Unspecified asthma, uncomplicated: Secondary | ICD-10-CM | POA: Diagnosis not present

## 2021-08-02 ENCOUNTER — Other Ambulatory Visit: Payer: Self-pay

## 2021-08-02 ENCOUNTER — Other Ambulatory Visit (HOSPITAL_COMMUNITY): Payer: Self-pay | Admitting: Surgery

## 2021-08-02 ENCOUNTER — Ambulatory Visit (HOSPITAL_COMMUNITY)
Admission: RE | Admit: 2021-08-02 | Discharge: 2021-08-02 | Disposition: A | Discharge status: Home / Self Care | Payer: Medicare HMO | Source: Ambulatory Visit | Attending: Surgery | Admitting: Surgery

## 2021-08-02 DIAGNOSIS — L03311 Cellulitis of abdominal wall: Secondary | ICD-10-CM | POA: Diagnosis not present

## 2021-08-02 DIAGNOSIS — L0390 Cellulitis, unspecified: Secondary | ICD-10-CM | POA: Diagnosis not present

## 2021-08-03 DIAGNOSIS — L02219 Cutaneous abscess of trunk, unspecified: Secondary | ICD-10-CM | POA: Diagnosis not present

## 2021-08-03 NOTE — Progress Notes (Signed)
USN does show a small complex abscess.  This will require incision and drainage.  Claiborne Billings - ask if Dr. Kieth Brightly will do this in the office, or does it need to go to the OR with one of the DOW's?  Thanks,  tmg  Armandina Gemma, MD [name] [place] [facility] Office: [phone]

## 2021-08-07 DIAGNOSIS — N3941 Urge incontinence: Secondary | ICD-10-CM | POA: Diagnosis not present

## 2021-08-07 DIAGNOSIS — L02219 Cutaneous abscess of trunk, unspecified: Secondary | ICD-10-CM | POA: Diagnosis not present

## 2021-08-07 DIAGNOSIS — N393 Stress incontinence (female) (male): Secondary | ICD-10-CM | POA: Diagnosis not present

## 2021-08-21 DIAGNOSIS — L02219 Cutaneous abscess of trunk, unspecified: Secondary | ICD-10-CM | POA: Diagnosis not present

## 2021-08-22 DIAGNOSIS — Z9682 Presence of neurostimulator: Secondary | ICD-10-CM | POA: Diagnosis not present

## 2021-08-22 DIAGNOSIS — L02219 Cutaneous abscess of trunk, unspecified: Secondary | ICD-10-CM | POA: Diagnosis not present

## 2021-08-22 DIAGNOSIS — N3941 Urge incontinence: Secondary | ICD-10-CM | POA: Diagnosis not present

## 2021-08-22 DIAGNOSIS — R350 Frequency of micturition: Secondary | ICD-10-CM | POA: Diagnosis not present

## 2021-08-22 DIAGNOSIS — N393 Stress incontinence (female) (male): Secondary | ICD-10-CM | POA: Diagnosis not present

## 2021-09-05 DIAGNOSIS — N183 Chronic kidney disease, stage 3 unspecified: Secondary | ICD-10-CM | POA: Diagnosis not present

## 2021-09-05 DIAGNOSIS — F324 Major depressive disorder, single episode, in partial remission: Secondary | ICD-10-CM | POA: Diagnosis not present

## 2021-09-05 DIAGNOSIS — I10 Essential (primary) hypertension: Secondary | ICD-10-CM | POA: Diagnosis not present

## 2021-09-05 DIAGNOSIS — E1165 Type 2 diabetes mellitus with hyperglycemia: Secondary | ICD-10-CM | POA: Diagnosis not present

## 2021-09-05 DIAGNOSIS — G43009 Migraine without aura, not intractable, without status migrainosus: Secondary | ICD-10-CM | POA: Diagnosis not present

## 2021-09-05 DIAGNOSIS — E7800 Pure hypercholesterolemia, unspecified: Secondary | ICD-10-CM | POA: Diagnosis not present

## 2021-09-05 DIAGNOSIS — E1122 Type 2 diabetes mellitus with diabetic chronic kidney disease: Secondary | ICD-10-CM | POA: Diagnosis not present

## 2021-09-05 DIAGNOSIS — D6859 Other primary thrombophilia: Secondary | ICD-10-CM | POA: Diagnosis not present

## 2021-09-05 DIAGNOSIS — Z0000 Encounter for general adult medical examination without abnormal findings: Secondary | ICD-10-CM | POA: Diagnosis not present

## 2021-09-10 ENCOUNTER — Encounter: Payer: Medicare HMO | Attending: Registered Nurse | Admitting: Registered Nurse

## 2021-09-10 ENCOUNTER — Encounter: Payer: Self-pay | Admitting: Registered Nurse

## 2021-09-10 ENCOUNTER — Other Ambulatory Visit: Payer: Self-pay

## 2021-09-10 VITALS — BP 145/87 | HR 83 | Temp 98.2°F | Ht 68.0 in | Wt 392.0 lb

## 2021-09-10 DIAGNOSIS — M1712 Unilateral primary osteoarthritis, left knee: Secondary | ICD-10-CM | POA: Insufficient documentation

## 2021-09-10 DIAGNOSIS — G894 Chronic pain syndrome: Secondary | ICD-10-CM | POA: Diagnosis not present

## 2021-09-10 DIAGNOSIS — Z5181 Encounter for therapeutic drug level monitoring: Secondary | ICD-10-CM | POA: Insufficient documentation

## 2021-09-10 DIAGNOSIS — M1711 Unilateral primary osteoarthritis, right knee: Secondary | ICD-10-CM | POA: Insufficient documentation

## 2021-09-10 DIAGNOSIS — Z79891 Long term (current) use of opiate analgesic: Secondary | ICD-10-CM | POA: Insufficient documentation

## 2021-09-10 DIAGNOSIS — M47816 Spondylosis without myelopathy or radiculopathy, lumbar region: Secondary | ICD-10-CM | POA: Diagnosis not present

## 2021-09-10 MED ORDER — OXYCODONE HCL 10 MG PO TABS: 10.0000 mg | ORAL_TABLET | Freq: Four times a day (QID) | ORAL | 0 refills | Status: DC | PRN

## 2021-09-10 NOTE — Progress Notes (Signed)
Subjective:    Patient ID: Kristen [name], female    DOB: 11-07-1965, 55 y.o.   MRN: [id number]  HPI: Kristen [name] is a 55 y.o. female who returns for follow up appointment for chronic pain and medication refill. She states her pain is located in her lower back. She rates her pain 8. Her current exercise regime is walking and performing stretching exercises.  Kristen [name] states a month ago she had a near miss fall, she was able to brace herself against the wall she reports. Educated on falls prevention, she verbalizes understanding.   Kristen [name] Morphine equivalent is 60.00 MME.   Oral Swab was Performed today.     Pain Inventory Average Pain 7 Pain Right Now 8 My pain is constant, sharp, stabbing, aching, and throbbing  In the last 24 hours, has pain interfered with the following? General activity 5 Relation with others 6 Enjoyment of life 8 What TIME of day is your pain at its worst? morning , daytime, evening, and night Sleep (in general) Poor  Pain is worse with: walking, bending, standing, and some activites Pain improves with: rest, heat/ice, and medication Relief from Meds: 7  Family History  Problem Relation Age of Onset   Hypertension Maternal Grandmother    Stroke Maternal Grandmother    Diabetes Maternal Grandfather    Cancer Mother        lung   Cancer Father    Diabetes Sister    Breast cancer Cousin    Heart attack Neg Hx    Social History   Socioeconomic History   Marital status: Single    Spouse name: Not on file   Number of children: Not on file   Years of education: Not on file   Highest education level: Not on file  Occupational History   Not on file  Tobacco Use   Smoking status: Never   Smokeless tobacco: Never  Vaping Use   Vaping Use: Never used  Substance and Sexual Activity   Alcohol use: No    Alcohol/week: 0.0 standard drinks   Drug use: No   Sexual activity: Not Currently    Comment: 1st intercourse 51 yo-1 partner   Other Topics Concern   Not on file  Social History Narrative   Not on file   Social Determinants of Health   Financial Resource Strain: Not on file  Food Insecurity: Not on file  Transportation Needs: Not on file  Physical Activity: Not on file  Stress: Not on file  Social Connections: Not on file   Past Surgical History:  Procedure Laterality Date   ABDOMINAL SURGERY     hernia repair   CHOLECYSTECTOMY  [id number]   COLONOSCOPY WITH PROPOFOL N/A 07/31/2016   Procedure: COLONOSCOPY WITH PROPOFOL;  Surgeon: Arta Silence, MD;  [facility];  Service: Endoscopy;  Laterality: N/A;   COLONOSCOPY WITH PROPOFOL N/A 03/08/2020   Procedure: COLONOSCOPY WITH PROPOFOL;  Surgeon: Arta Silence, MD;  [facility];  Service: Endoscopy;  Laterality: N/A;   FOOT SURGERY Right 06/2007   IR RADIOLOGIST EVAL & MGMT  12/14/2019   KNEE ARTHROSCOPY  09/2008   left   KNEE ARTHROSCOPY  april 2011   left   NASAL SINUS SURGERY     NASAL SINUS SURGERY  1990   POLYPECTOMY  03/08/2020   Procedure: POLYPECTOMY;  Surgeon: Arta Silence, MD;  [facility];  Service: Endoscopy;;   SHOULDER SURGERY  2004   left   Past  Surgical History:  Procedure Laterality Date   ABDOMINAL SURGERY     hernia repair   CHOLECYSTECTOMY  [id number]   COLONOSCOPY WITH PROPOFOL N/A 07/31/2016   Procedure: COLONOSCOPY WITH PROPOFOL;  Surgeon: Arta Silence, MD;  [facility];  Service: Endoscopy;  Laterality: N/A;   COLONOSCOPY WITH PROPOFOL N/A 03/08/2020   Procedure: COLONOSCOPY WITH PROPOFOL;  Surgeon: Arta Silence, MD;  [facility];  Service: Endoscopy;  Laterality: N/A;   FOOT SURGERY Right 06/2007   IR RADIOLOGIST EVAL & MGMT  12/14/2019   KNEE ARTHROSCOPY  09/2008   left   KNEE ARTHROSCOPY  april 2011   left   NASAL SINUS SURGERY     NASAL SINUS SURGERY  1990   POLYPECTOMY  03/08/2020   Procedure: POLYPECTOMY;  Surgeon: Arta Silence, MD;  [facility];  Service: Endoscopy;;   SHOULDER SURGERY  2004   left   Past Medical History:  Diagnosis Date   Anxiety    Asthma    Degenerative arthritis    in back   Depression    Diabetes mellitus    History of pulmonary embolus (PE)    Hypertension    Migraines    Near syncope    Obesity    PCOS (polycystic ovarian syndrome)    Peripheral edema    Sleep apnea    uses cpap set on 10   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    Ht 5\' 8"  (1.727 m)   Wt (!) 392 lb (177.8 kg)   BMI 59.60 kg/m   Opioid Risk Score:   Fall Risk Score:  `1  Depression screen PHQ 2/9  Depression screen [facility] 2/9 09/10/2021 07/10/2021 05/10/2021 06/13/2020 04/11/2020 02/08/2020 03/30/2018  Decreased Interest 1 3 1  0 0 3 2  Down, Depressed, Hopeless 2 3 1  0 3 3 2   PHQ - 2 Score 3 6 2  0 3 6 4   Altered sleeping - - 1 - 3 - -  Tired, decreased energy - - 1 - 3 - -  Change in appetite - - 1 - 2 - -  Feeling bad or failure about yourself  - - 1 - 3 - -  Trouble concentrating - - 1 - 3 - -  Moving slowly or fidgety/restless - - 1 - 3 - -  Suicidal thoughts - - 1 - 1 - -  PHQ-9 Score - - 9 - 21 - -  Difficult doing work/chores - - - - - - -     Review of Systems  Constitutional: Negative.   HENT: Negative.    Eyes: Negative.   Respiratory: Negative.    Cardiovascular: Negative.   Gastrointestinal: Negative.   Endocrine: Negative.   Genitourinary:  Positive for urgency.  Musculoskeletal:  Positive for back pain.  Skin: Negative.   Allergic/Immunologic: Negative.   Neurological: Negative.   Hematological: Negative.   Psychiatric/Behavioral:  Positive for dysphoric mood and sleep disturbance.       Objective:   Physical Exam Vitals and nursing note reviewed.  Constitutional:      Appearance: Normal appearance.  Cardiovascular:     Rate and Rhythm: Normal rate and regular rhythm.     Pulses: Normal pulses.     Heart sounds: Normal heart sounds.  Pulmonary:     Effort:  Pulmonary effort is normal.     Breath sounds: Normal breath sounds.  Musculoskeletal:     Cervical back: Normal range of  motion and neck supple.     Comments: Normal Muscle Bulk and Muscle Testing Reveals:  Upper Extremities: Full ROM and Muscle Strength 5/5  Lumbar Paraspinal Tenderness: L-4-L-5 Lower Extremities: Full ROM and Muscle Strength 5/5 Arises from table slowly  Narrow Based  Gait     Skin:    General: Skin is warm and dry.  Neurological:     Mental Status: She is alert and oriented to person, place, and time.  Psychiatric:        Mood and Affect: Mood normal.        Behavior: Behavior normal.         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 She verbalizes understanding. .09/10/2021 Refilled: Oxycodone 10 mg one tablet every 8 hours as needed #90.  Second script e-scribe for the following month. We will continue the opioid monitoring program, this consists of regular clinic visits, examinations, urine drug screen, pill counts as well as use of [place] Controlled Substance Reporting system. A 12 month History has been reviewed on the [place] Controlled Substance Reporting System on 09/10/2021. 2. Morbid obesity: Continue Healthy Diet Regime and HEP. 09/10/2021 3. Type II diabetes:  Dr. Buddy Duty following. 09/10/2021. 4. Reactive Depression: Continue Zoloft. 09/10/2021. 5. Myofascial Muscle Pain: Continue current medication regimen with Soma. 09/10/2021 6. Bilateral  Chronic Knee Pain:  Continue HEP as Tolerated. Continue to Monitor. 09/10/2021 7. Right Greater Trochanter Bursitis:  Continue to alternate Ice and Heat Therapy. Continue to monitor. 09/10/2021   F/U in 2 months

## 2021-09-16 LAB — DRUG TOX MONITOR 1 W/CONF, ORAL FLD
Amobarbital: NEGATIVE ng/mL (ref <10)
Amphetamines: NEGATIVE ng/mL (ref <10)
Barbiturates: NEGATIVE ng/mL (ref <10)
Benzodiazepines: NEGATIVE ng/mL (ref <0.50)
Buprenorphine: NEGATIVE ng/mL (ref <0.10)
Butalbital: NEGATIVE ng/mL (ref <10)
Carisoprodol: 122.6 ng/mL — ABNORMAL HIGH (ref <2.5)
Cocaine: NEGATIVE ng/mL (ref <5.0)
Codeine: NEGATIVE ng/mL (ref <2.5)
Dihydrocodeine: NEGATIVE ng/mL (ref <2.5)
Fentanyl: NEGATIVE ng/mL (ref <0.10)
Heroin Metabolite: NEGATIVE ng/mL (ref <1.0)
Hydrocodone: NEGATIVE ng/mL (ref <2.5)
Hydromorphone: NEGATIVE ng/mL (ref <2.5)
MARIJUANA: NEGATIVE ng/mL (ref <2.5)
MDMA: NEGATIVE ng/mL (ref <10)
Meprobamate: >250 ng/mL — ABNORMAL HIGH (ref <2.5)
Meprobamate: POSITIVE ng/mL — AB (ref <2.5)
Methadone: NEGATIVE ng/mL (ref <5.0)
Morphine: NEGATIVE ng/mL (ref <2.5)
Nicotine Metabolite: NEGATIVE ng/mL (ref <5.0)
Norhydrocodone: NEGATIVE ng/mL (ref <2.5)
Noroxycodone: 32.5 ng/mL — ABNORMAL HIGH (ref <2.5)
Opiates: POSITIVE ng/mL — AB (ref <2.5)
Oxycodone: 74 ng/mL — ABNORMAL HIGH (ref <2.5)
Oxymorphone: NEGATIVE ng/mL (ref <2.5)
Pentobarbital: NEGATIVE ng/mL (ref <10)
Phencyclidine: NEGATIVE ng/mL (ref <10)
Secobarbital: NEGATIVE ng/mL (ref <10)
Tapentadol: NEGATIVE ng/mL (ref <5.0)
Tramadol: NEGATIVE ng/mL (ref <5.0)
Zolpidem: NEGATIVE ng/mL (ref <5.0)

## 2021-09-16 LAB — DRUG TOX ALC METAB W/CON, ORAL FLD: Alcohol Metabolite: NEGATIVE ng/mL (ref <25)

## 2021-09-21 DIAGNOSIS — H04011 Acute dacryoadenitis, right lacrimal gland: Secondary | ICD-10-CM | POA: Diagnosis not present

## 2021-09-25 ENCOUNTER — Other Ambulatory Visit: Payer: Self-pay | Admitting: Internal Medicine

## 2021-09-25 ENCOUNTER — Ambulatory Visit
Admission: RE | Admit: 2021-09-25 | Discharge: 2021-09-25 | Disposition: A | Discharge status: Home / Self Care | Payer: Medicare HMO | Source: Ambulatory Visit | Attending: Internal Medicine | Admitting: Internal Medicine

## 2021-09-25 DIAGNOSIS — M79672 Pain in left foot: Secondary | ICD-10-CM

## 2021-09-25 DIAGNOSIS — Z794 Long term (current) use of insulin: Secondary | ICD-10-CM | POA: Diagnosis not present

## 2021-09-25 DIAGNOSIS — E1165 Type 2 diabetes mellitus with hyperglycemia: Secondary | ICD-10-CM | POA: Diagnosis not present

## 2021-09-25 DIAGNOSIS — H04011 Acute dacryoadenitis, right lacrimal gland: Secondary | ICD-10-CM | POA: Diagnosis not present

## 2021-09-25 DIAGNOSIS — E1122 Type 2 diabetes mellitus with diabetic chronic kidney disease: Secondary | ICD-10-CM | POA: Diagnosis not present

## 2021-09-25 DIAGNOSIS — E113293 Type 2 diabetes mellitus with mild nonproliferative diabetic retinopathy without macular edema, bilateral: Secondary | ICD-10-CM | POA: Diagnosis not present

## 2021-09-25 DIAGNOSIS — M7989 Other specified soft tissue disorders: Secondary | ICD-10-CM | POA: Diagnosis not present

## 2021-09-27 ENCOUNTER — Telehealth: Payer: Self-pay | Admitting: *Deleted

## 2021-09-27 NOTE — Telephone Encounter (Signed)
Oral swab drug screen was consistent for prescribed medications.  ?

## 2021-10-01 ENCOUNTER — Ambulatory Visit: Payer: PPO | Admitting: Podiatry

## 2021-10-08 ENCOUNTER — Ambulatory Visit: Payer: Medicare HMO | Admitting: Podiatry

## 2021-10-08 ENCOUNTER — Other Ambulatory Visit: Payer: Self-pay

## 2021-10-08 ENCOUNTER — Ambulatory Visit: Payer: Self-pay | Admitting: Podiatry

## 2021-10-08 ENCOUNTER — Ambulatory Visit (INDEPENDENT_AMBULATORY_CARE_PROVIDER_SITE_OTHER): Payer: Medicare HMO

## 2021-10-08 DIAGNOSIS — M7672 Peroneal tendinitis, left leg: Secondary | ICD-10-CM

## 2021-10-08 DIAGNOSIS — M25572 Pain in left ankle and joints of left foot: Secondary | ICD-10-CM

## 2021-10-08 DIAGNOSIS — R0989 Other specified symptoms and signs involving the circulatory and respiratory systems: Secondary | ICD-10-CM

## 2021-10-09 NOTE — Progress Notes (Signed)
Subjective:   Patient ID: Kristen [name], female   DOB: 55 y.o.   MRN: [id number]   HPI 55 year old female presents the office today for concerns of left foot and ankle discomfort point the lateral aspect of the foot.  Goes on lateral aspect ankle towards the fifth toe.  She gets a sharp shooting pain.  She said it hurts to walk at times.  Some mild swelling but no redness.  No recent treatment.   Review of Systems  All other systems reviewed and are negative.  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     Past Surgical History:  Procedure Laterality Date   ABDOMINAL SURGERY     hernia repair   CHOLECYSTECTOMY  [id number]   COLONOSCOPY WITH PROPOFOL N/A 07/31/2016   Procedure: COLONOSCOPY WITH PROPOFOL;  Surgeon: Arta Silence, MD;  [facility];  Service: Endoscopy;  Laterality: N/A;   COLONOSCOPY WITH PROPOFOL N/A 03/08/2020   Procedure: COLONOSCOPY WITH PROPOFOL;  Surgeon: Arta Silence, MD;  [facility];  Service: Endoscopy;  Laterality: N/A;   FOOT SURGERY Right 06/2007   IR RADIOLOGIST EVAL & MGMT  12/14/2019   KNEE ARTHROSCOPY  09/2008   left   KNEE ARTHROSCOPY  april 2011   left   NASAL SINUS SURGERY     NASAL SINUS SURGERY  1990   POLYPECTOMY  03/08/2020   Procedure: POLYPECTOMY;  Surgeon: Arta Silence, MD;  [facility];  Service: Endoscopy;;   SHOULDER SURGERY  2004   left     Current Outpatient Medications:    acetaminophen (TYLENOL) 500 MG tablet, Take 1,000 mg by mouth every 6 (six) hours as needed for moderate pain., Disp: , Rfl:    albuterol (ACCUNEB) 1.25 MG/3ML nebulizer solution,  Inhale 1 ampule into the lungs 3 (three) times daily as needed for wheezing or shortness of breath. , Disp: , Rfl:    albuterol (VENTOLIN HFA) 108 (90 Base) MCG/ACT inhaler, Inhale 2 puffs into the lungs every 4 (four) hours as needed for wheezing or shortness of breath. , Disp: , Rfl:    albuterol (VENTOLIN HFA) 108 (90 Base) MCG/ACT inhaler, 1-2 puff as needed, Disp: , Rfl:    apixaban (ELIQUIS) 2.5 MG TABS tablet, Take 1 tablet (2.5 mg total) by mouth 2 (two) times daily., Disp: 60 tablet, Rfl: 0   aspirin EC 81 MG tablet, Take 81 mg by mouth daily., Disp: , Rfl:    atorvastatin (LIPITOR) 40 MG tablet, Take 40 mg by mouth daily., Disp: , Rfl:    Blood Glucose Monitoring Suppl (TRUE METRIX AIR GLUCOSE METER) DEVI, See admin instructions., Disp: , Rfl:    Blood Glucose Monitoring Suppl (TRUE METRIX AIR GLUCOSE METER) DEVI, check blood sugar, Disp: , Rfl:    buPROPion (WELLBUTRIN XL) 150 MG 24 hr tablet, Take 150 mg by mouth every morning. 300 mg total, Disp: , Rfl:    buPROPion (WELLBUTRIN XL) 300 MG 24 hr tablet, 1 tablet in the morning  orally once a day, Disp: , Rfl:    carisoprodol (SOMA) 350 MG tablet, TAKE 1 TABLET EVERY 8 HOURS, Disp: 270 tablet, Rfl: 0   Cholecalciferol (VITAMIN D3) 125 MCG (5000 UT) TABS, Take 5,000 Units by mouth daily. , Disp: , Rfl:    cyclobenzaprine (FLEXERIL) 5 MG tablet, Take by mouth., Disp: , Rfl:    exenatide (BYETTA) 5 MCG/0.02ML SOPN injection, Inject into the skin., Disp: , Rfl:    Fluticasone-Salmeterol (ADVAIR) 500-50 MCG/DOSE AEPB, Inhale 1 puff into the lungs every 12 (twelve) hours., Disp: , Rfl:    gabapentin (NEURONTIN) 100 MG capsule, TAKE 2 CAPSULES IN THE MORNING, TAKE 1 CAPSULE IN THE AFTERNOON, AND TAKE 1 CAPSULE AT BEDTIME, Disp: 360 capsule, Rfl: 1   glimepiride (AMARYL) 4 MG tablet, Take by mouth., Disp: , Rfl:    ibuprofen (ADVIL) 800 MG tablet, Take by mouth., Disp: , Rfl:    insulin aspart (NOVOLOG FLEXPEN) 100 UNIT/ML FlexPen, Inject 15  Units into the skin 3 (three) times daily with meals. (Patient taking differently: Inject 44 Units into the skin 3 (three) times daily with meals.), Disp: 15 mL, Rfl: 11   insulin glargine (LANTUS) 100 UNIT/ML injection, Inject 0.6 mLs (60 Units total) into the skin at bedtime. (Patient taking differently: Inject 80 Units into the skin at bedtime.), Disp: 10 mL, Rfl: 11   Lancets MISC, use to check blood sugar, Disp: , Rfl:    lisinopril (ZESTRIL) 20 MG tablet, Take 20 mg by mouth daily., Disp: , Rfl:    montelukast (SINGULAIR) 10 MG tablet, Take 10 mg by mouth at bedtime., Disp: , Rfl:    oxybutynin (DITROPAN XL) 15 MG 24 hr tablet, Take 30 mg by mouth at bedtime., Disp: , Rfl:    Oxycodone HCl 10 MG TABS, Take 1 tablet (10 mg total) by mouth every 6 (six) hours as needed (pain)., Disp: 120 tablet, Rfl: 0   pravastatin (PRAVACHOL) 40 MG tablet, Take by mouth., Disp: , Rfl:    promethazine (PHENERGAN) 25 MG tablet, Take 25 mg by mouth every 6 (six) hours as needed for nausea or vomiting. , Disp: , Rfl:    senna-docusate (SENOKOT-S) 8.6-50 MG tablet, Take 1 tablet by mouth at bedtime., Disp: 30 tablet, Rfl: 0   sertraline (ZOLOFT) 100 MG tablet, Take 200 mg by mouth daily. , Disp: , Rfl:    topiramate (TOPAMAX) 25 MG tablet, Take by mouth., Disp: , Rfl:   Allergies  Allergen Reactions   Cafergot Other (See Comments)    Chest pain; ergotamine-caffiene   Glucophage [Metformin Hcl] Anaphylaxis   Metformin Anaphylaxis   Canagliflozin Swelling and Other (See Comments)    Legs Swell invokana Legs Swell   Ergotamine-Caffeine Other (See Comments)    Chest pain   Ergotamine          Objective:  Physical Exam  General: AAO x3, NAD  Dermatological: Superficial abrasion present on the left leg without any erythema or signs of infection.  No drainage or pus.  Vascular: Dorsalis Pedis artery and Posterior Tibial artery pedal pulses are 1/4 bilateral with immedate capillary fill time. There is no  pain with calf compression, swelling, warmth, erythema.   Neruologic: Grossly intact via light touch bilateral.  Musculoskeletal: Trace tenderness along the course of the peroneal tendon posterior and inferior to the lateral malleolus towards the fifth metatarsal base.  Mild discomfort along the fibula distally.  Ankle range of motion intact.  No other areas of discomfort.  Muscular  strength 5/5 in all groups tested bilateral.  Gait: Unassisted, Nonantalgic.       Assessment:   55 year old female with peroneal tendinitis     Plan:  -Treatment options discussed including all alternatives, risks, and complications -Etiology of symptoms were discussed -X-rays were obtained and reviewed with the patient.  No definite evidence of acute fracture.  On lateral view there was some edema present distal fibula but the other views this was a shadow. -Given her discomfort recommend immobilization in cam boot which was dispensed today.  Continue to ice and elevate as well.  Discussed as the pain resolves we can then start stretching, rehab.  Discussed shoes and good support long-term. -Given decreased pulses ordered ABI -Patient is concerned that she appears to have broken her foot without injury.  Discussed follow-up with her primary care physician for possible DEXA scan.  Trula Slade DPM

## 2021-10-25 ENCOUNTER — Other Ambulatory Visit: Payer: Self-pay | Admitting: Podiatry

## 2021-10-25 ENCOUNTER — Other Ambulatory Visit: Payer: Self-pay

## 2021-10-25 ENCOUNTER — Ambulatory Visit (HOSPITAL_COMMUNITY)
Admission: RE | Admit: 2021-10-25 | Discharge: 2021-10-25 | Disposition: A | Discharge status: Home / Self Care | Payer: Medicare HMO | Source: Ambulatory Visit | Attending: Cardiology | Admitting: Cardiology

## 2021-10-25 DIAGNOSIS — R0989 Other specified symptoms and signs involving the circulatory and respiratory systems: Secondary | ICD-10-CM | POA: Diagnosis not present

## 2021-10-26 ENCOUNTER — Other Ambulatory Visit: Payer: Self-pay | Admitting: Podiatry

## 2021-10-26 DIAGNOSIS — R0989 Other specified symptoms and signs involving the circulatory and respiratory systems: Secondary | ICD-10-CM

## 2021-10-30 ENCOUNTER — Ambulatory Visit: Payer: Medicare HMO | Admitting: Podiatry

## 2021-11-07 ENCOUNTER — Encounter: Payer: Medicare HMO | Attending: Registered Nurse | Admitting: Registered Nurse

## 2021-11-07 ENCOUNTER — Other Ambulatory Visit: Payer: Self-pay

## 2021-11-07 ENCOUNTER — Encounter: Payer: Self-pay | Admitting: Registered Nurse

## 2021-11-07 VITALS — BP 157/90 | HR 88 | Temp 98.2°F | Ht 68.0 in | Wt 385.0 lb

## 2021-11-07 DIAGNOSIS — M47816 Spondylosis without myelopathy or radiculopathy, lumbar region: Secondary | ICD-10-CM | POA: Diagnosis not present

## 2021-11-07 DIAGNOSIS — M1712 Unilateral primary osteoarthritis, left knee: Secondary | ICD-10-CM | POA: Insufficient documentation

## 2021-11-07 DIAGNOSIS — G894 Chronic pain syndrome: Secondary | ICD-10-CM | POA: Diagnosis not present

## 2021-11-07 DIAGNOSIS — M170 Bilateral primary osteoarthritis of knee: Secondary | ICD-10-CM

## 2021-11-07 DIAGNOSIS — Z79891 Long term (current) use of opiate analgesic: Secondary | ICD-10-CM | POA: Diagnosis not present

## 2021-11-07 DIAGNOSIS — M1711 Unilateral primary osteoarthritis, right knee: Secondary | ICD-10-CM | POA: Diagnosis not present

## 2021-11-07 DIAGNOSIS — Z5181 Encounter for therapeutic drug level monitoring: Secondary | ICD-10-CM | POA: Insufficient documentation

## 2021-11-07 MED ORDER — OXYCODONE HCL 10 MG PO TABS: 10.0000 mg | ORAL_TABLET | Freq: Four times a day (QID) | ORAL | 0 refills | Status: DC | PRN

## 2021-11-07 NOTE — Progress Notes (Signed)
Subjective:    Patient ID: Kristen [name], female    DOB: 03-23-1966, 56 y.o.   MRN: [id number]  HPI: Kristen [name] is a 56 y.o. female who returns for follow up appointment for chronic pain and medication refill. She states her pain is located in her lower back and bilateral knees L>R. She  rates her pain 7. Her current exercise regime is walking and performing stretching exercises.  Kristen [name] Morphine equivalent is 60.00 MME.   Last Oral Swab was Performed on 09/10/2021, it was consistent.      Pain Inventory Average Pain 7 Pain Right Now 7 My pain is constant, sharp, burning, dull, stabbing, tingling, aching, and throbbing  In the last 24 hours, has pain interfered with the following? General activity 7 Relation with others 8 Enjoyment of life 7 What TIME of day is your pain at its worst? morning , daytime, evening, and night Sleep (in general) Poor  Pain is worse with: walking, bending, and standing Pain improves with: rest, heat/ice, and medication Relief from Meds: 7  Family History  Problem Relation Age of Onset   Hypertension Maternal Grandmother    Stroke Maternal Grandmother    Diabetes Maternal Grandfather    Cancer Mother        lung   Cancer Father    Diabetes Sister    Breast cancer Cousin    Heart attack Neg Hx    Social History   Socioeconomic History   Marital status: Single    Spouse name: Not on file   Number of children: Not on file   Years of education: Not on file   Highest education level: Not on file  Occupational History   Not on file  Tobacco Use   Smoking status: Never   Smokeless tobacco: Never  Vaping Use   Vaping Use: Never used  Substance and Sexual Activity   Alcohol use: No    Alcohol/week: 0.0 standard drinks   Drug use: No   Sexual activity: Not Currently    Comment: 1st intercourse 52 yo-1 partner  Other Topics Concern   Not on file  Social History Narrative   Not on file   Social Determinants of Health    Financial Resource Strain: Not on file  Food Insecurity: Not on file  Transportation Needs: Not on file  Physical Activity: Not on file  Stress: Not on file  Social Connections: Not on file   Past Surgical History:  Procedure Laterality Date   ABDOMINAL SURGERY     hernia repair   CHOLECYSTECTOMY  [id number]   COLONOSCOPY WITH PROPOFOL N/A 07/31/2016   Procedure: COLONOSCOPY WITH PROPOFOL;  Surgeon: Arta Silence, MD;  [facility];  Service: Endoscopy;  Laterality: N/A;   COLONOSCOPY WITH PROPOFOL N/A 03/08/2020   Procedure: COLONOSCOPY WITH PROPOFOL;  Surgeon: Arta Silence, MD;  [facility];  Service: Endoscopy;  Laterality: N/A;   FOOT SURGERY Right 06/2007   IR RADIOLOGIST EVAL & MGMT  12/14/2019   KNEE ARTHROSCOPY  09/2008   left   KNEE ARTHROSCOPY  april 2011   left   NASAL SINUS SURGERY     NASAL SINUS SURGERY  1990   POLYPECTOMY  03/08/2020   Procedure: POLYPECTOMY;  Surgeon: Arta Silence, MD;  [facility];  Service: Endoscopy;;   SHOULDER SURGERY  2004   left   Past Surgical History:  Procedure Laterality Date   ABDOMINAL SURGERY     hernia repair   CHOLECYSTECTOMY  [id number]   COLONOSCOPY WITH PROPOFOL N/A 07/31/2016   Procedure: COLONOSCOPY WITH PROPOFOL;  Surgeon: Arta Silence, MD;  [facility];  Service: Endoscopy;  Laterality: N/A;   COLONOSCOPY WITH PROPOFOL N/A 03/08/2020   Procedure: COLONOSCOPY WITH PROPOFOL;  Surgeon: Arta Silence, MD;  [facility];  Service: Endoscopy;  Laterality: N/A;   FOOT SURGERY Right 06/2007   IR RADIOLOGIST EVAL & MGMT  12/14/2019   KNEE ARTHROSCOPY  09/2008   left   KNEE ARTHROSCOPY  april 2011   left   NASAL SINUS SURGERY     NASAL SINUS SURGERY  1990   POLYPECTOMY  03/08/2020   Procedure: POLYPECTOMY;  Surgeon: Arta Silence, MD;  [facility];  Service: Endoscopy;;   SHOULDER SURGERY  2004   left   Past Medical History:  Diagnosis Date    Anxiety    Asthma    Degenerative arthritis    in back   Depression    Diabetes mellitus    History of pulmonary embolus (PE)    Hypertension    Migraines    Near syncope    Obesity    PCOS (polycystic ovarian syndrome)    Peripheral edema    Sleep apnea    uses cpap set on 10   Stroke ([place]) 12/2011   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    BP (!) 157/90    Pulse 88    Temp 98.2 F (36.8 C) (Oral)    Ht 5\' 8"  (1.727 m)    Wt (!) 385 lb (174.6 kg)    SpO2 97%    BMI 58.54 kg/m   Opioid Risk Score:   Fall Risk Score:  `1  Depression screen PHQ 2/9  Depression screen [facility] 2/9 09/10/2021 07/10/2021 05/10/2021 06/13/2020 04/11/2020 02/08/2020 03/30/2018  Decreased Interest 1 3 1  0 0 3 2  Down, Depressed, Hopeless 2 3 1  0 3 3 2   PHQ - 2 Score 3 6 2  0 3 6 4   Altered sleeping - - 1 - 3 - -  Tired, decreased energy - - 1 - 3 - -  Change in appetite - - 1 - 2 - -  Feeling bad or failure about yourself  - - 1 - 3 - -  Trouble concentrating - - 1 - 3 - -  Moving slowly or fidgety/restless - - 1 - 3 - -  Suicidal thoughts - - 1 - 1 - -  PHQ-9 Score - - 9 - 21 - -  Difficult doing work/chores - - - - - - -    Review of Systems  Genitourinary:  Positive for urgency.  Musculoskeletal:  Positive for back pain.  Psychiatric/Behavioral:  Positive for dysphoric mood. The patient is nervous/anxious.   All other systems reviewed and are negative.     Objective:   Physical Exam Vitals and nursing note reviewed.  Constitutional:      Appearance: Normal appearance. She is obese.  Cardiovascular:     Rate and Rhythm: Normal rate and regular rhythm.     Pulses: Normal pulses.     Heart sounds: Normal heart sounds.  Pulmonary:     Effort: Pulmonary effort is normal.     Breath sounds: Normal breath sounds.  Musculoskeletal:     Cervical back: Normal range of motion and neck supple.     Comments: Normal Muscle Bulk and Muscle Testing Reveals:  Upper Extremities: Full ROM and Muscle Strength  5/5 Lumbar Paraspinal Tenderness: L-4-L-5:  Mainly Right Side Lower Extremities: Full ROM and Muscle Strength 5/5 Bilateral Lower Extremities Flexion Produces Pin into her Bilateral Patella's Arises from Table Slowly Narrow Based   Gait     Skin:    General: Skin is warm and dry.  Neurological:     Mental Status: She is alert and oriented to person, place, and time.  Psychiatric:        Mood and Affect: Mood normal.        Behavior: Behavior normal.         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 She verbalizes understanding. Marland Kitchen01/18/2023 Refilled: Oxycodone 10 mg one tablet every 8 hours as needed #90.  Second script e-scribe for the following month. We will continue the opioid monitoring program, this consists of regular clinic visits, examinations, urine drug screen, pill counts as well as use of [place] Controlled Substance Reporting system. A 12 month History has been reviewed on the [place] Controlled Substance Reporting System on 11/07/2021. 2. Morbid obesity: Continue Healthy Diet Regime and HEP. 01/18//2023 3. Type II diabetes:  Dr. Buddy Duty following. 11/07/2021 4. Reactive Depression: Continue Zoloft. 11/07/2021. 5. Myofascial Muscle Pain: Continue current medication regimen with Soma. 11/07/2021 6. Bilateral  Chronic Knee Pain:  Continue HEP as Tolerated. Continue to Monitor. 11/07/2021 7. Right Greater Trochanter Bursitis:  No complaints today. Continue to alternate Ice and Heat Therapy. Continue to monitor. 11/07/2021   F/U in 2 months

## 2021-11-12 ENCOUNTER — Other Ambulatory Visit: Payer: Self-pay | Admitting: Internal Medicine

## 2021-11-12 DIAGNOSIS — M85879 Other specified disorders of bone density and structure, unspecified ankle and foot: Secondary | ICD-10-CM

## 2021-12-17 DIAGNOSIS — N3281 Overactive bladder: Secondary | ICD-10-CM | POA: Diagnosis not present

## 2021-12-17 DIAGNOSIS — N3946 Mixed incontinence: Secondary | ICD-10-CM | POA: Diagnosis not present

## 2021-12-17 DIAGNOSIS — R350 Frequency of micturition: Secondary | ICD-10-CM | POA: Diagnosis not present

## 2021-12-17 DIAGNOSIS — Z960 Presence of urogenital implants: Secondary | ICD-10-CM | POA: Diagnosis not present

## 2021-12-25 ENCOUNTER — Other Ambulatory Visit: Payer: Self-pay | Admitting: Family Medicine

## 2021-12-25 DIAGNOSIS — Z1231 Encounter for screening mammogram for malignant neoplasm of breast: Secondary | ICD-10-CM

## 2021-12-27 DIAGNOSIS — J019 Acute sinusitis, unspecified: Secondary | ICD-10-CM | POA: Diagnosis not present

## 2021-12-27 DIAGNOSIS — R0982 Postnasal drip: Secondary | ICD-10-CM | POA: Diagnosis not present

## 2021-12-27 DIAGNOSIS — R0981 Nasal congestion: Secondary | ICD-10-CM | POA: Diagnosis not present

## 2021-12-27 DIAGNOSIS — R059 Cough, unspecified: Secondary | ICD-10-CM | POA: Diagnosis not present

## 2022-01-07 ENCOUNTER — Other Ambulatory Visit: Payer: Self-pay

## 2022-01-07 ENCOUNTER — Encounter: Payer: Self-pay | Admitting: Registered Nurse

## 2022-01-07 ENCOUNTER — Encounter: Payer: Medicare HMO | Attending: Registered Nurse | Admitting: Registered Nurse

## 2022-01-07 VITALS — BP 143/87 | HR 81 | Ht 68.0 in | Wt 390.2 lb

## 2022-01-07 DIAGNOSIS — Z5181 Encounter for therapeutic drug level monitoring: Secondary | ICD-10-CM | POA: Diagnosis not present

## 2022-01-07 DIAGNOSIS — G894 Chronic pain syndrome: Secondary | ICD-10-CM | POA: Diagnosis not present

## 2022-01-07 DIAGNOSIS — Z79891 Long term (current) use of opiate analgesic: Secondary | ICD-10-CM | POA: Diagnosis not present

## 2022-01-07 DIAGNOSIS — M47816 Spondylosis without myelopathy or radiculopathy, lumbar region: Secondary | ICD-10-CM | POA: Diagnosis not present

## 2022-01-07 DIAGNOSIS — M7918 Myalgia, other site: Secondary | ICD-10-CM | POA: Insufficient documentation

## 2022-01-07 DIAGNOSIS — M1711 Unilateral primary osteoarthritis, right knee: Secondary | ICD-10-CM | POA: Insufficient documentation

## 2022-01-07 DIAGNOSIS — M1712 Unilateral primary osteoarthritis, left knee: Secondary | ICD-10-CM | POA: Insufficient documentation

## 2022-01-07 MED ORDER — OXYCODONE HCL 10 MG PO TABS: 10.0000 mg | ORAL_TABLET | Freq: Four times a day (QID) | ORAL | 0 refills | Status: DC | PRN

## 2022-01-07 MED ORDER — CARISOPRODOL 350 MG PO TABS: ORAL_TABLET | ORAL | 0 refills | Status: DC

## 2022-01-07 NOTE — Progress Notes (Signed)
? ?Subjective:  ? ? Patient ID: Kristen [name], female    DOB: 1966-09-11, 56 y.o.   MRN: [id number] ? ?HPI: Kristen [name] is a 56 y.o. female who returns for follow up appointment for chronic pain and medication refill. She states her pain is located in her lower back. She rates her pain 7.Her  current exercise regime is walking and performing stretching exercises. ? ?Kristen [name] Morphine equivalent is 60.00 MME.   UDS ordered today. ?  ? ?Pain Inventory ?Average Pain 7 ?Pain Right Now 7 ?My pain is constant, sharp, burning, dull, and stabbing ? ?In the last 24 hours, has pain interfered with the following? ?General activity 9 ?Relation with others 8 ?Enjoyment of life 10 ?What TIME of day is your pain at its worst? morning , daytime, evening, and night ?Sleep (in general) Poor ? ?Pain is worse with: walking, bending, standing, and some activites ?Pain improves with: rest, heat/ice, and medication ?Relief from Meds: 7 ? ?Family History  ?Problem Relation Age of Onset  ? Hypertension Maternal Grandmother   ? Stroke Maternal Grandmother   ? Diabetes Maternal Grandfather   ? Cancer Mother   ?     lung  ? Cancer Father   ? Diabetes Sister   ? Breast cancer Cousin   ? Heart attack Neg Hx   ? ?Social History  ? ?Socioeconomic History  ? Marital status: Single  ?  Spouse name: Not on file  ? Number of children: Not on file  ? Years of education: Not on file  ? Highest education level: Not on file  ?Occupational History  ? Not on file  ?Tobacco Use  ? Smoking status: Never  ? Smokeless tobacco: Never  ?Vaping Use  ? Vaping Use: Never used  ?Substance and Sexual Activity  ? Alcohol use: No  ?  Alcohol/week: 0.0 standard drinks  ? Drug use: No  ? Sexual activity: Not Currently  ?  Comment: 1st intercourse 8 yo-1 partner  ?Other Topics Concern  ? Not on file  ?Social History Narrative  ? Not on file  ? ?Social Determinants of Health  ? ?Financial Resource Strain: Not on file  ?Food Insecurity: Not on file   ?Transportation Needs: Not on file  ?Physical Activity: Not on file  ?Stress: Not on file  ?Social Connections: Not on file  ? ?Past Surgical History:  ?Procedure Laterality Date  ? ABDOMINAL SURGERY    ? hernia repair  ? CHOLECYSTECTOMY  [id number]  ? COLONOSCOPY WITH PROPOFOL N/A 07/31/2016  ? Procedure: COLONOSCOPY WITH PROPOFOL;  Surgeon: Arta Silence, MD;  [facility];  Service: Endoscopy;  Laterality: N/A;  ? COLONOSCOPY WITH PROPOFOL N/A 03/08/2020  ? Procedure: COLONOSCOPY WITH PROPOFOL;  Surgeon: Arta Silence, MD;  [facility];  Service: Endoscopy;  Laterality: N/A;  ? FOOT SURGERY Right 06/2007  ? IR RADIOLOGIST EVAL & MGMT  12/14/2019  ? KNEE ARTHROSCOPY  09/2008  ? left  ? KNEE ARTHROSCOPY  april 2011  ? left  ? NASAL SINUS SURGERY    ? NASAL SINUS SURGERY  1990  ? POLYPECTOMY  03/08/2020  ? Procedure: POLYPECTOMY;  Surgeon: Arta Silence, MD;  [facility];  Service: Endoscopy;;  ? SHOULDER SURGERY  2004  ? left  ? ?Past Surgical History:  ?Procedure Laterality Date  ? ABDOMINAL SURGERY    ? hernia repair  ? CHOLECYSTECTOMY  [id number]  ? COLONOSCOPY WITH PROPOFOL N/A 07/31/2016  ? Procedure: COLONOSCOPY WITH PROPOFOL;  Surgeon: Arta Silence, MD;  Location: [facility];  Service: Endoscopy;  Laterality: N/A;  ? COLONOSCOPY WITH PROPOFOL N/A 03/08/2020  ? Procedure: COLONOSCOPY WITH PROPOFOL;  Surgeon: Arta Silence, MD;  [facility];  Service: Endoscopy;  Laterality: N/A;  ? FOOT SURGERY Right 06/2007  ? IR RADIOLOGIST EVAL & MGMT  12/14/2019  ? KNEE ARTHROSCOPY  09/2008  ? left  ? KNEE ARTHROSCOPY  april 2011  ? left  ? NASAL SINUS SURGERY    ? NASAL SINUS SURGERY  1990  ? POLYPECTOMY  03/08/2020  ? Procedure: POLYPECTOMY;  Surgeon: Arta Silence, MD;  [facility];  Service: Endoscopy;;  ? SHOULDER SURGERY  2004  ? left  ? ?Past Medical History:  ?Diagnosis Date  ? Anxiety   ? Asthma   ? Degenerative arthritis   ? in back  ? Depression   ?  Diabetes mellitus   ? History of pulmonary embolus (PE)   ? Hypertension   ? Migraines   ? Near syncope   ? Obesity   ? PCOS (polycystic ovarian syndrome)   ? Peripheral edema   ? Sleep apnea   ? uses cpap set on 10  ? Stroke [name]) 12/2011  ? 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   ? ?BP (!) 143/87   Pulse 81   Ht '5\' 8"'$  (1.727 m)   Wt (!) 390 lb 3.2 oz (177 kg)   SpO2 96%   BMI 59.33 kg/m?  ? ?Opioid Risk Score:   ?Fall Risk Score:  `1 ? ?Depression screen PHQ 2/9 ? ?Depression screen [facility] 2/9 01/07/2022 11/07/2021 09/10/2021 07/10/2021 05/10/2021 06/13/2020 04/11/2020  ?Decreased Interest 0 '1 1 3 1 '$ 0 0  ?Down, Depressed, Hopeless '3 1 2 3 1 '$ 0 3  ?PHQ - 2 Score '3 2 3 6 2 '$ 0 3  ?Altered sleeping - - - - 1 - 3  ?Tired, decreased energy - - - - 1 - 3  ?Change in appetite - - - - 1 - 2  ?Feeling bad or failure about yourself  - - - - 1 - 3  ?Trouble concentrating - - - - 1 - 3  ?Moving slowly or fidgety/restless - - - - 1 - 3  ?Suicidal thoughts - - - - 1 - 1  ?PHQ-9 Score - - - - 9 - 21  ?Difficult doing work/chores - - - - - - -  ?  ? ?Review of Systems  ?Constitutional: Negative.   ?HENT: Negative.    ?Eyes: Negative.   ?Respiratory: Negative.    ?Cardiovascular: Negative.   ?Gastrointestinal: Negative.   ?Endocrine: Negative.   ?Genitourinary: Negative.   ?Musculoskeletal:  Positive for back pain.  ?Skin: Negative.   ?Allergic/Immunologic: Negative.   ?Neurological: Negative.   ?Hematological: Negative.   ?Psychiatric/Behavioral:  Positive for dysphoric mood and sleep disturbance.   ? ?   ?Objective:  ? Physical Exam ?Vitals and nursing note reviewed.  ?Constitutional:   ?   Appearance: Normal appearance. She is obese.  ?Cardiovascular:  ?   Rate and Rhythm: Normal rate and regular rhythm.  ?   Pulses: Normal pulses.  ?   Heart sounds: Normal heart sounds.  ?Pulmonary:  ?   Effort: Pulmonary effort is normal.  ?   Breath sounds: Normal breath sounds.  ?Musculoskeletal:  ?   Cervical back: Normal range of motion and neck supple.   ?   Comments: Normal Muscle Bulk and Muscle Testing Reveals:  ?Upper Extremities:  Full ROM and Muscle Strength 5/5 ? Lumbar Paraspinal Tenderness: L3-L-5 ?Lower Extremities: Full ROM and Muscle Strength 5/5 ?Arises from Table Slowly ?Narrow Based  Gait  ?   ?Skin: ?   General: Skin is warm and dry.  ?Neurological:  ?   Mental Status: She is alert and oriented to person, place, and time.  ?Psychiatric:     ?   Mood and Affect: Mood normal.     ?   Behavior: Behavior normal.  ? ? ? ? ?   ?Assessment & Plan:  ?1.Spondylosis of Lumbar Region/ Lumbar Facet arthropathy: Lumbar Radiculitis: Increase  Gabapentin Hs dose to two capsules at bedtime.Send a Mychart message next week with update on medication change. She verbalizes understanding. Marland Kitchen03/20/2023 ?Refilled: Oxycodone 10 mg one tablet every 8 hours as needed #90.  Second script e-scribe for the following month. ?We will continue the opioid monitoring program, this consists of regular clinic visits, examinations, urine drug screen, pill counts as well as use of [place] Controlled Substance Reporting system. A 12 month History has been reviewed on the [place] Controlled Substance Reporting System on 01/07/2022. ?2. Morbid obesity: Continue Healthy Diet Regime and HEP. 03/20//2023 ?3. Type II diabetes:  Dr. Buddy Duty following. 01/07/2022 ?4. Reactive Depression: Continue Zoloft. 01/07/2022. ?5. Myofascial Muscle Pain: Continue current medication regimen with Soma. 01/07/2022 ?6. Bilateral  Chronic Knee Pain:No complaints today.  Continue HEP as Tolerated. Continue to Monitor. 01/07/2022 ?7. Right Greater Trochanter Bursitis:  No complaints today. Continue to alternate Ice and Heat Therapy. Continue to monitor. 01/07/2022 ?  ?F/U in 2 months  ? ? ? ? ? ? ? ? ?

## 2022-01-08 DIAGNOSIS — R0602 Shortness of breath: Secondary | ICD-10-CM | POA: Diagnosis not present

## 2022-01-08 DIAGNOSIS — B370 Candidal stomatitis: Secondary | ICD-10-CM | POA: Diagnosis not present

## 2022-01-08 DIAGNOSIS — J45909 Unspecified asthma, uncomplicated: Secondary | ICD-10-CM | POA: Diagnosis not present

## 2022-01-11 DIAGNOSIS — H524 Presbyopia: Secondary | ICD-10-CM | POA: Diagnosis not present

## 2022-01-11 DIAGNOSIS — H468 Other optic neuritis: Secondary | ICD-10-CM | POA: Diagnosis not present

## 2022-01-12 LAB — TOXASSURE SELECT,+ANTIDEPR,UR

## 2022-01-16 ENCOUNTER — Telehealth: Payer: Self-pay | Admitting: *Deleted

## 2022-01-16 NOTE — Telephone Encounter (Signed)
Urine drug screen for this encounter is consistent for prescribed medication 

## 2022-01-25 ENCOUNTER — Ambulatory Visit: Payer: Medicare HMO

## 2022-01-29 DIAGNOSIS — R0602 Shortness of breath: Secondary | ICD-10-CM | POA: Diagnosis not present

## 2022-01-29 DIAGNOSIS — Z6841 Body Mass Index (BMI) 40.0 and over, adult: Secondary | ICD-10-CM | POA: Diagnosis not present

## 2022-01-29 DIAGNOSIS — R350 Frequency of micturition: Secondary | ICD-10-CM | POA: Diagnosis not present

## 2022-01-29 DIAGNOSIS — R609 Edema, unspecified: Secondary | ICD-10-CM | POA: Diagnosis not present

## 2022-01-30 ENCOUNTER — Ambulatory Visit
Admission: RE | Admit: 2022-01-30 | Discharge: 2022-01-30 | Disposition: A | Discharge status: Home / Self Care | Payer: Medicare HMO | Source: Ambulatory Visit | Attending: Family Medicine | Admitting: Family Medicine

## 2022-01-30 DIAGNOSIS — Z1231 Encounter for screening mammogram for malignant neoplasm of breast: Secondary | ICD-10-CM

## 2022-02-19 DIAGNOSIS — J45909 Unspecified asthma, uncomplicated: Secondary | ICD-10-CM | POA: Diagnosis not present

## 2022-02-19 DIAGNOSIS — E1122 Type 2 diabetes mellitus with diabetic chronic kidney disease: Secondary | ICD-10-CM | POA: Diagnosis not present

## 2022-02-19 DIAGNOSIS — G4733 Obstructive sleep apnea (adult) (pediatric): Secondary | ICD-10-CM | POA: Diagnosis not present

## 2022-02-19 DIAGNOSIS — R350 Frequency of micturition: Secondary | ICD-10-CM | POA: Diagnosis not present

## 2022-02-19 DIAGNOSIS — R110 Nausea: Secondary | ICD-10-CM | POA: Diagnosis not present

## 2022-02-19 DIAGNOSIS — N183 Chronic kidney disease, stage 3 unspecified: Secondary | ICD-10-CM | POA: Diagnosis not present

## 2022-02-19 DIAGNOSIS — N3941 Urge incontinence: Secondary | ICD-10-CM | POA: Diagnosis not present

## 2022-02-19 DIAGNOSIS — I129 Hypertensive chronic kidney disease with stage 1 through stage 4 chronic kidney disease, or unspecified chronic kidney disease: Secondary | ICD-10-CM | POA: Diagnosis not present

## 2022-02-19 DIAGNOSIS — E785 Hyperlipidemia, unspecified: Secondary | ICD-10-CM | POA: Diagnosis not present

## 2022-02-20 DIAGNOSIS — I10 Essential (primary) hypertension: Secondary | ICD-10-CM | POA: Diagnosis not present

## 2022-02-20 DIAGNOSIS — Z01810 Encounter for preprocedural cardiovascular examination: Secondary | ICD-10-CM | POA: Diagnosis not present

## 2022-02-28 DIAGNOSIS — R350 Frequency of micturition: Secondary | ICD-10-CM | POA: Diagnosis not present

## 2022-02-28 DIAGNOSIS — G4733 Obstructive sleep apnea (adult) (pediatric): Secondary | ICD-10-CM | POA: Diagnosis not present

## 2022-02-28 DIAGNOSIS — T83110A Breakdown (mechanical) of urinary electronic stimulator device, initial encounter: Secondary | ICD-10-CM | POA: Diagnosis not present

## 2022-02-28 DIAGNOSIS — N183 Chronic kidney disease, stage 3 unspecified: Secondary | ICD-10-CM | POA: Diagnosis not present

## 2022-02-28 DIAGNOSIS — Z462 Encounter for fitting and adjustment of other devices related to nervous system and special senses: Secondary | ICD-10-CM | POA: Diagnosis not present

## 2022-02-28 DIAGNOSIS — J45909 Unspecified asthma, uncomplicated: Secondary | ICD-10-CM | POA: Diagnosis not present

## 2022-02-28 DIAGNOSIS — R110 Nausea: Secondary | ICD-10-CM | POA: Diagnosis not present

## 2022-02-28 DIAGNOSIS — I129 Hypertensive chronic kidney disease with stage 1 through stage 4 chronic kidney disease, or unspecified chronic kidney disease: Secondary | ICD-10-CM | POA: Diagnosis not present

## 2022-02-28 DIAGNOSIS — E1122 Type 2 diabetes mellitus with diabetic chronic kidney disease: Secondary | ICD-10-CM | POA: Diagnosis not present

## 2022-02-28 DIAGNOSIS — N3941 Urge incontinence: Secondary | ICD-10-CM | POA: Diagnosis not present

## 2022-02-28 DIAGNOSIS — E785 Hyperlipidemia, unspecified: Secondary | ICD-10-CM | POA: Diagnosis not present

## 2022-03-11 ENCOUNTER — Ambulatory Visit: Payer: Medicare HMO | Admitting: Registered Nurse

## 2022-03-13 ENCOUNTER — Encounter: Payer: Medicare HMO | Attending: Registered Nurse | Admitting: Registered Nurse

## 2022-03-13 VITALS — BP 154/84 | HR 87 | Ht 68.0 in | Wt 385.6 lb

## 2022-03-13 DIAGNOSIS — M1712 Unilateral primary osteoarthritis, left knee: Secondary | ICD-10-CM | POA: Insufficient documentation

## 2022-03-13 DIAGNOSIS — Z79899 Other long term (current) drug therapy: Secondary | ICD-10-CM | POA: Diagnosis not present

## 2022-03-13 DIAGNOSIS — Z6841 Body Mass Index (BMI) 40.0 and over, adult: Secondary | ICD-10-CM

## 2022-03-13 DIAGNOSIS — E119 Type 2 diabetes mellitus without complications: Secondary | ICD-10-CM

## 2022-03-13 DIAGNOSIS — M47816 Spondylosis without myelopathy or radiculopathy, lumbar region: Secondary | ICD-10-CM | POA: Diagnosis not present

## 2022-03-13 DIAGNOSIS — M1711 Unilateral primary osteoarthritis, right knee: Secondary | ICD-10-CM | POA: Insufficient documentation

## 2022-03-13 DIAGNOSIS — G8929 Other chronic pain: Secondary | ICD-10-CM

## 2022-03-13 DIAGNOSIS — Z79891 Long term (current) use of opiate analgesic: Secondary | ICD-10-CM | POA: Diagnosis not present

## 2022-03-13 DIAGNOSIS — G894 Chronic pain syndrome: Secondary | ICD-10-CM | POA: Diagnosis not present

## 2022-03-13 DIAGNOSIS — M7918 Myalgia, other site: Secondary | ICD-10-CM | POA: Insufficient documentation

## 2022-03-13 DIAGNOSIS — M170 Bilateral primary osteoarthritis of knee: Secondary | ICD-10-CM | POA: Diagnosis not present

## 2022-03-13 DIAGNOSIS — Z5181 Encounter for therapeutic drug level monitoring: Secondary | ICD-10-CM | POA: Insufficient documentation

## 2022-03-13 MED ORDER — OXYCODONE HCL 10 MG PO TABS: 10.0000 mg | ORAL_TABLET | Freq: Four times a day (QID) | ORAL | 0 refills | Status: DC | PRN

## 2022-03-13 MED ORDER — OXYCODONE-ACETAMINOPHEN 10-325 MG PO TABS: 1.0000 | ORAL_TABLET | Freq: Four times a day (QID) | ORAL | 0 refills | Status: DC | PRN

## 2022-03-13 NOTE — Progress Notes (Signed)
Subjective:    Patient ID: Kristen [name], female    DOB: 1966/09/03, 56 y.o.   MRN: [id number]  HPI: Kristen [name] is a 56 y.o. female who returns for follow up appointment for chronic pain and medication refill. She states her pain is located in her lower back pain. She rates her pain 7. Her  current exercise regime is walking and performing stretching exercises.  On 02/28/2022, she underwent supra pubic catheter placement by Dr Amalia Hailey.   Ms. Litz Morphine equivalent is  60.00 MME.   Last UDS was Performed on 01/07/2022, it was consistent .    Pain Inventory Average Pain 8 Pain Right Now 7 My pain is constant, sharp, burning, dull, and aching  In the last 24 hours, has pain interfered with the following? General activity 10 Relation with others 9 Enjoyment of life 10 What TIME of day is your pain at its worst? daytime, evening, and night Sleep (in general) Poor  Pain is worse with: walking, bending, and some activites Pain improves with: rest, heat/ice, and medication Relief from Meds: 6  Family History  Problem Relation Age of Onset   Hypertension Maternal Grandmother    Stroke Maternal Grandmother    Diabetes Maternal Grandfather    Cancer Mother        lung   Cancer Father    Diabetes Sister    Breast cancer Cousin    Heart attack Neg Hx    Social History   Socioeconomic History   Marital status: Single    Spouse name: Not on file   Number of children: Not on file   Years of education: Not on file   Highest education level: Not on file  Occupational History   Not on file  Tobacco Use   Smoking status: Never   Smokeless tobacco: Never  Vaping Use   Vaping Use: Never used  Substance and Sexual Activity   Alcohol use: No    Alcohol/week: 0.0 standard drinks   Drug use: No   Sexual activity: Not Currently    Comment: 1st intercourse 68 yo-1 partner  Other Topics Concern   Not on file  Social History Narrative   Not on file   Social  Determinants of Health   Financial Resource Strain: Not on file  Food Insecurity: Not on file  Transportation Needs: Not on file  Physical Activity: Not on file  Stress: Not on file  Social Connections: Not on file   Past Surgical History:  Procedure Laterality Date   ABDOMINAL SURGERY     hernia repair   CHOLECYSTECTOMY  [id number]   COLONOSCOPY WITH PROPOFOL N/A 07/31/2016   Procedure: COLONOSCOPY WITH PROPOFOL;  Surgeon: Arta Silence, MD;  [facility];  Service: Endoscopy;  Laterality: N/A;   COLONOSCOPY WITH PROPOFOL N/A 03/08/2020   Procedure: COLONOSCOPY WITH PROPOFOL;  Surgeon: Arta Silence, MD;  [facility];  Service: Endoscopy;  Laterality: N/A;   FOOT SURGERY Right 06/2007   IR RADIOLOGIST EVAL & MGMT  12/14/2019   KNEE ARTHROSCOPY  09/2008   left   KNEE ARTHROSCOPY  april 2011   left   NASAL SINUS SURGERY     NASAL SINUS SURGERY  1990   POLYPECTOMY  03/08/2020   Procedure: POLYPECTOMY;  Surgeon: Arta Silence, MD;  [facility];  Service: Endoscopy;;   SHOULDER SURGERY  2004   left   Past Surgical History:  Procedure Laterality Date   ABDOMINAL SURGERY     hernia  repair   CHOLECYSTECTOMY  [id number]   COLONOSCOPY WITH PROPOFOL N/A 07/31/2016   Procedure: COLONOSCOPY WITH PROPOFOL;  Surgeon: Arta Silence, MD;  [facility];  Service: Endoscopy;  Laterality: N/A;   COLONOSCOPY WITH PROPOFOL N/A 03/08/2020   Procedure: COLONOSCOPY WITH PROPOFOL;  Surgeon: Arta Silence, MD;  [facility];  Service: Endoscopy;  Laterality: N/A;   FOOT SURGERY Right 06/2007   IR RADIOLOGIST EVAL & MGMT  12/14/2019   KNEE ARTHROSCOPY  09/2008   left   KNEE ARTHROSCOPY  april 2011   left   NASAL SINUS SURGERY     NASAL SINUS SURGERY  1990   POLYPECTOMY  03/08/2020   Procedure: POLYPECTOMY;  Surgeon: Arta Silence, MD;  [facility];  Service: Endoscopy;;   SHOULDER SURGERY  2004   left   Past Medical History:   Diagnosis Date   Anxiety    Asthma    Degenerative arthritis    in back   Depression    Diabetes mellitus    History of pulmonary embolus (PE)    Hypertension    Migraines    Near syncope    Obesity    PCOS (polycystic ovarian syndrome)    Peripheral edema    Sleep apnea    uses cpap set on 10   Stroke ([place]) 12/2011   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    BP (!) 154/84   Pulse 87   Ht '5\' 8"'$  (1.727 m)   Wt (!) 385 lb 9.6 oz (174.9 kg)   SpO2 97%   BMI 58.63 kg/m   Opioid Risk Score:   Fall Risk Score:  `1  Depression screen [facility] 2/9     03/13/2022    9:22 AM 01/07/2022   11:39 AM 11/07/2021   11:57 AM 09/10/2021   12:51 PM 07/10/2021    1:28 PM 05/10/2021    1:08 PM 06/13/2020    1:51 PM  Depression screen PHQ 2/9  Decreased Interest 2 0 '1 1 3 1 '$ 0  Down, Depressed, Hopeless '2 3 1 2 3 1 '$ 0  PHQ - 2 Score '4 3 2 3 6 2 '$ 0  Altered sleeping      1   Tired, decreased energy      1   Change in appetite      1   Feeling bad or failure about yourself       1   Trouble concentrating      1   Moving slowly or fidgety/restless      1   Suicidal thoughts      1   PHQ-9 Score      9      Review of Systems  Musculoskeletal:  Positive for back pain.  All other systems reviewed and are negative.     Objective:   Physical Exam Vitals and nursing note reviewed.  Constitutional:      Appearance: Normal appearance.  Cardiovascular:     Rate and Rhythm: Normal rate and regular rhythm.  Pulmonary:     Effort: Pulmonary effort is normal.     Breath sounds: Normal breath sounds.  Abdominal:     Comments: Abdominal dressing intact with odor noted: She will F/u with her Urologist  Genitourinary:    Comments: Abdominal Dressing Intact: Supra Pubic Catheter with yellow drainage  Musculoskeletal:     Cervical back: Normal range of motion and neck supple.     Comments: Normal Muscle Bulk and Muscle  Testing Reveals:  Upper Extremities:Full  ROM and Muscle Strength 5/5 Lumbar  Paraspinal Tenderness: L-4--L-5 Lower Extremities: Full ROM and Muscle Strength 5/5 Arises from Table with ease Narrow Based  Gait     Skin:    General: Skin is warm and dry.  Neurological:     Mental Status: She is alert and oriented to person, place, and time.  Psychiatric:        Mood and Affect: Mood normal.        Behavior: Behavior normal.         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 She verbalizes understanding. Marland Kitchen05/24/2023 Refilled: Oxycodone 10 mg one tablet every 8 hours as needed #90.  Second script e-scribe for the following month. We will continue the opioid monitoring program, this consists of regular clinic visits, examinations, urine drug screen, pill counts as well as use of [place] Controlled Substance Reporting system. A 12 month History has been reviewed on the [place] Controlled Substance Reporting System on 03/13/2022. 2. Morbid obesity: Continue Healthy Diet Regime and HEP. 05/24//2023 3. Type II diabetes:  Dr. Buddy Duty following. 03/13/2022 4. Reactive Depression: Continue Zoloft. 03/13/2022. 5. Myofascial Muscle Pain: Continue current medication regimen with Soma. 03/13/2022 6. Bilateral  Chronic Knee Pain:No complaints today.  Continue HEP as Tolerated. Continue to Monitor. 03/13/2022 7. Right Greater Trochanter Bursitis:  No complaints today. Continue to alternate Ice and Heat Therapy. Continue to monitor. 03/13/2022   F/U in 2 months

## 2022-03-18 ENCOUNTER — Encounter: Payer: Self-pay | Admitting: Registered Nurse

## 2022-03-26 ENCOUNTER — Other Ambulatory Visit: Payer: Self-pay | Admitting: Registered Nurse

## 2022-03-26 NOTE — Telephone Encounter (Signed)
PMP was Reviewed.  Soma e-scribed today.  Kristen [name] is aware via My-Chart message

## 2022-04-03 ENCOUNTER — Other Ambulatory Visit: Payer: Medicare HMO

## 2022-05-01 DIAGNOSIS — R350 Frequency of micturition: Secondary | ICD-10-CM | POA: Diagnosis not present

## 2022-05-01 DIAGNOSIS — N3941 Urge incontinence: Secondary | ICD-10-CM | POA: Diagnosis not present

## 2022-05-01 DIAGNOSIS — Z466 Encounter for fitting and adjustment of urinary device: Secondary | ICD-10-CM | POA: Diagnosis not present

## 2022-05-14 ENCOUNTER — Encounter: Payer: Medicare HMO | Attending: Registered Nurse | Admitting: Registered Nurse

## 2022-05-14 VITALS — BP 136/85 | HR 85 | Ht 68.0 in | Wt 384.6 lb

## 2022-05-14 DIAGNOSIS — M1712 Unilateral primary osteoarthritis, left knee: Secondary | ICD-10-CM

## 2022-05-14 DIAGNOSIS — M7918 Myalgia, other site: Secondary | ICD-10-CM

## 2022-05-14 DIAGNOSIS — Z5181 Encounter for therapeutic drug level monitoring: Secondary | ICD-10-CM | POA: Diagnosis not present

## 2022-05-14 DIAGNOSIS — M7061 Trochanteric bursitis, right hip: Secondary | ICD-10-CM | POA: Diagnosis not present

## 2022-05-14 DIAGNOSIS — M1711 Unilateral primary osteoarthritis, right knee: Secondary | ICD-10-CM

## 2022-05-14 DIAGNOSIS — M4726 Other spondylosis with radiculopathy, lumbar region: Secondary | ICD-10-CM | POA: Diagnosis not present

## 2022-05-14 DIAGNOSIS — M4696 Unspecified inflammatory spondylopathy, lumbar region: Secondary | ICD-10-CM

## 2022-05-14 DIAGNOSIS — Z79891 Long term (current) use of opiate analgesic: Secondary | ICD-10-CM

## 2022-05-14 DIAGNOSIS — M47816 Spondylosis without myelopathy or radiculopathy, lumbar region: Secondary | ICD-10-CM | POA: Diagnosis not present

## 2022-05-14 DIAGNOSIS — M25561 Pain in right knee: Secondary | ICD-10-CM | POA: Diagnosis not present

## 2022-05-14 DIAGNOSIS — G894 Chronic pain syndrome: Secondary | ICD-10-CM

## 2022-05-14 DIAGNOSIS — F329 Major depressive disorder, single episode, unspecified: Secondary | ICD-10-CM

## 2022-05-14 DIAGNOSIS — M25562 Pain in left knee: Secondary | ICD-10-CM

## 2022-05-14 DIAGNOSIS — E119 Type 2 diabetes mellitus without complications: Secondary | ICD-10-CM | POA: Diagnosis not present

## 2022-05-14 MED ORDER — OXYCODONE-ACETAMINOPHEN 10-325 MG PO TABS: 1.0000 | ORAL_TABLET | Freq: Four times a day (QID) | ORAL | 0 refills | Status: DC | PRN

## 2022-05-14 NOTE — Progress Notes (Signed)
Subjective:    Patient ID: Kristen [name], female    DOB: 10-Sep-1966, 56 y.o.   MRN: [id number]  HPI: Kristen [name] is a 56 y.o. female who returns for follow up appointment for chronic pain and medication refill. She states her pain is located in her lower back pain mainly right side. She rates her pain 7. Her current exercise regime is walking and performing stretching exercises.  Kristen [name] Morphine equivalent is 60.00 MME.   Last UDS was Performed on 01/07/2022, it was consistent.    Pain Inventory Average Pain 7 Pain Right Now 7 My pain is constant, sharp, burning, dull, stabbing, tingling, and aching  In the last 24 hours, has pain interfered with the following? General activity 8 Relation with others 7 Enjoyment of life 10 What TIME of day is your pain at its worst? morning , daytime, evening, and night Sleep (in general) Poor  Pain is worse with: walking, bending, standing, and some activites Pain improves with: rest, heat/ice, and medication Relief from Meds: 6  Family History  Problem Relation Age of Onset   Hypertension Maternal Grandmother    Stroke Maternal Grandmother    Diabetes Maternal Grandfather    Cancer Mother        lung   Cancer Father    Diabetes Sister    Breast cancer Cousin    Heart attack Neg Hx    Social History   Socioeconomic History   Marital status: Single    Spouse name: Not on file   Number of children: Not on file   Years of education: Not on file   Highest education level: Not on file  Occupational History   Not on file  Tobacco Use   Smoking status: Never   Smokeless tobacco: Never  Vaping Use   Vaping Use: Never used  Substance and Sexual Activity   Alcohol use: No    Alcohol/week: 0.0 standard drinks of alcohol   Drug use: No   Sexual activity: Not Currently    Comment: 1st intercourse 48 yo-1 partner  Other Topics Concern   Not on file  Social History Narrative   Not on file   Social Determinants of  Health   Financial Resource Strain: Not on file  Food Insecurity: Not on file  Transportation Needs: Not on file  Physical Activity: Not on file  Stress: Not on file  Social Connections: Not on file   Past Surgical History:  Procedure Laterality Date   ABDOMINAL SURGERY     hernia repair   CHOLECYSTECTOMY  [id number]   COLONOSCOPY WITH PROPOFOL N/A 07/31/2016   Procedure: COLONOSCOPY WITH PROPOFOL;  Surgeon: Arta Silence, MD;  Location: [facility];  Service: Endoscopy;  Laterality: N/A;   COLONOSCOPY WITH PROPOFOL N/A 03/08/2020   Procedure: COLONOSCOPY WITH PROPOFOL;  Surgeon: Arta Silence, MD;  Location: [facility];  Service: Endoscopy;  Laterality: N/A;   FOOT SURGERY Right 06/2007   IR RADIOLOGIST EVAL & MGMT  12/14/2019   KNEE ARTHROSCOPY  09/2008   left   KNEE ARTHROSCOPY  april 2011   left   NASAL SINUS SURGERY     NASAL SINUS SURGERY  1990   POLYPECTOMY  03/08/2020   Procedure: POLYPECTOMY;  Surgeon: Arta Silence, MD;  Location: [facility];  Service: Endoscopy;;   SHOULDER SURGERY  2004   left   Past Surgical History:  Procedure Laterality Date   ABDOMINAL SURGERY     hernia repair   CHOLECYSTECTOMY  [id number]  COLONOSCOPY WITH PROPOFOL N/A 07/31/2016   Procedure: COLONOSCOPY WITH PROPOFOL;  Surgeon: Arta Silence, MD;  [facility]: [facility];  Service: Endoscopy;  Laterality: N/A;   COLONOSCOPY WITH PROPOFOL N/A 03/08/2020   Procedure: COLONOSCOPY WITH PROPOFOL;  Surgeon: Arta Silence, MD;  [facility];  Service: Endoscopy;  Laterality: N/A;   FOOT SURGERY Right 06/2007   IR RADIOLOGIST EVAL & MGMT  12/14/2019   KNEE ARTHROSCOPY  09/2008   left   KNEE ARTHROSCOPY  april 2011   left   NASAL SINUS SURGERY     NASAL SINUS SURGERY  1990   POLYPECTOMY  03/08/2020   Procedure: POLYPECTOMY;  Surgeon: Arta Silence, MD;  [facility];  Service: Endoscopy;;   SHOULDER SURGERY  2004   left   Past Medical History:  Diagnosis Date    Anxiety    Asthma    Degenerative arthritis    in back   Depression    Diabetes mellitus    History of pulmonary embolus (PE)    Hypertension    Migraines    Near syncope    Obesity    PCOS (polycystic ovarian syndrome)    Peripheral edema    Sleep apnea    uses cpap set on 10   Stroke ([place]) 12/2011   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    BP 136/85   Pulse 85   Ht '5\' 8"'$  (1.727 m)   Wt (!) 384 lb 9.6 oz (174.5 kg)   SpO2 96%   BMI 58.48 kg/m   Opioid Risk Score:   Fall Risk Score:  `1  Depression screen [facility] 2/9     05/14/2022    1:43 PM 03/13/2022    9:22 AM 01/07/2022   11:39 AM 11/07/2021   11:57 AM 09/10/2021   12:51 PM 07/10/2021    1:28 PM 05/10/2021    1:08 PM  Depression screen PHQ 2/9  Decreased Interest 1 2 0 '1 1 3 1  '$ Down, Depressed, Hopeless '1 2 3 1 2 3 1  '$ PHQ - 2 Score '2 4 3 2 3 6 2  '$ Altered sleeping       1  Tired, decreased energy       1  Change in appetite       1  Feeling bad or failure about yourself        1  Trouble concentrating       1  Moving slowly or fidgety/restless       1  Suicidal thoughts       1  PHQ-9 Score       9      Review of Systems  Musculoskeletal:  Positive for back pain.  All other systems reviewed and are negative.     Objective:   Physical Exam Vitals and nursing note reviewed.  Constitutional:      Appearance: Normal appearance. She is obese.  Cardiovascular:     Rate and Rhythm: Normal rate and regular rhythm.     Pulses: Normal pulses.     Heart sounds: Normal heart sounds.  Pulmonary:     Effort: Pulmonary effort is normal.     Breath sounds: Normal breath sounds.  Genitourinary:    Comments: Suprapubic Catheter : Yellow Urine draining Musculoskeletal:     Cervical back: Normal range of motion and neck supple.     Comments: Normal Muscle Bulk and Muscle Testing Reveals:  Upper Extremities: Full ROM and Muscle Strength 5/5 Lumbar  Paraspinal Tenderness: L-4-L-5 Lower Extremities: Full ROM and Muscle  Strength 5/5 Arises from Table slowly Narrow Based  Gait     Skin:    General: Skin is warm and dry.  Neurological:     Mental Status: She is alert and oriented to person, place, and time.  Psychiatric:        Mood and Affect: Mood normal.        Behavior: Behavior normal.         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 She verbalizes understanding. Marland Kitchen07/24/2023 Refilled: Oxycodone 10/325 mg one tablet every 8 hours as needed #90.  Second script e-scribe for the following month. We will continue the opioid monitoring program, this consists of regular clinic visits, examinations, urine drug screen, pill counts as well as use of [place] Controlled Substance Reporting system. A 12 month History has been reviewed on the [place] Controlled Substance Reporting System on 05/13/2022. 2. Morbid obesity: Continue Healthy Diet Regime and HEP. 07/24//2023 3. Type II diabetes:  Dr. Buddy Duty following. 05/13/2022 4. Reactive Depression: Continue Zoloft. 05/13/2022. 5. Myofascial Muscle Pain: Continue current medication regimen with Soma. 05/13/2022 6. Bilateral  Chronic Knee Pain:No complaints today.  Continue HEP as Tolerated. Continue to Monitor. 05/13/2022 7. Right Greater Trochanter Bursitis:  No complaints today. Continue to alternate Ice and Heat Therapy. Continue to monitor. 05/13/2022   F/U in 2 months

## 2022-05-23 ENCOUNTER — Encounter: Payer: Self-pay | Admitting: Registered Nurse

## 2022-05-29 DIAGNOSIS — Z466 Encounter for fitting and adjustment of urinary device: Secondary | ICD-10-CM | POA: Diagnosis not present

## 2022-05-29 DIAGNOSIS — N393 Stress incontinence (female) (male): Secondary | ICD-10-CM | POA: Diagnosis not present

## 2022-07-03 DIAGNOSIS — N3941 Urge incontinence: Secondary | ICD-10-CM | POA: Diagnosis not present

## 2022-07-03 DIAGNOSIS — Z435 Encounter for attention to cystostomy: Secondary | ICD-10-CM | POA: Diagnosis not present

## 2022-07-10 ENCOUNTER — Encounter: Payer: Medicare HMO | Attending: Registered Nurse | Admitting: Registered Nurse

## 2022-07-10 ENCOUNTER — Encounter: Payer: Self-pay | Admitting: Registered Nurse

## 2022-07-10 VITALS — BP 155/79 | HR 97 | Ht 68.0 in | Wt 387.6 lb

## 2022-07-10 DIAGNOSIS — Z6841 Body Mass Index (BMI) 40.0 and over, adult: Secondary | ICD-10-CM

## 2022-07-10 DIAGNOSIS — M7918 Myalgia, other site: Secondary | ICD-10-CM | POA: Insufficient documentation

## 2022-07-10 DIAGNOSIS — M1711 Unilateral primary osteoarthritis, right knee: Secondary | ICD-10-CM | POA: Insufficient documentation

## 2022-07-10 DIAGNOSIS — F4321 Adjustment disorder with depressed mood: Secondary | ICD-10-CM

## 2022-07-10 DIAGNOSIS — M170 Bilateral primary osteoarthritis of knee: Secondary | ICD-10-CM

## 2022-07-10 DIAGNOSIS — M47816 Spondylosis without myelopathy or radiculopathy, lumbar region: Secondary | ICD-10-CM | POA: Insufficient documentation

## 2022-07-10 DIAGNOSIS — M25511 Pain in right shoulder: Secondary | ICD-10-CM | POA: Insufficient documentation

## 2022-07-10 DIAGNOSIS — G894 Chronic pain syndrome: Secondary | ICD-10-CM | POA: Diagnosis not present

## 2022-07-10 DIAGNOSIS — G8929 Other chronic pain: Secondary | ICD-10-CM | POA: Insufficient documentation

## 2022-07-10 DIAGNOSIS — E119 Type 2 diabetes mellitus without complications: Secondary | ICD-10-CM | POA: Diagnosis not present

## 2022-07-10 DIAGNOSIS — M1712 Unilateral primary osteoarthritis, left knee: Secondary | ICD-10-CM | POA: Diagnosis not present

## 2022-07-10 DIAGNOSIS — Z5181 Encounter for therapeutic drug level monitoring: Secondary | ICD-10-CM | POA: Insufficient documentation

## 2022-07-10 DIAGNOSIS — Z79891 Long term (current) use of opiate analgesic: Secondary | ICD-10-CM | POA: Diagnosis not present

## 2022-07-10 DIAGNOSIS — M7061 Trochanteric bursitis, right hip: Secondary | ICD-10-CM | POA: Diagnosis not present

## 2022-07-10 MED ORDER — OXYCODONE-ACETAMINOPHEN 10-325 MG PO TABS: 1.0000 | ORAL_TABLET | Freq: Four times a day (QID) | ORAL | 0 refills | Status: DC | PRN

## 2022-07-10 NOTE — Progress Notes (Signed)
Subjective:    Patient ID: Kristen [name], female    DOB: Jul 15, 1966, 56 y.o.   MRN: [id number]  HPI: Kristen [name] is a 56 y.o. female who returns for follow up appointment for chronic pain and medication refill. She states her pain is located in her right shoulder, lower back,, right hip pain and  bilateral knee pain. She rates her pain 8. Her current exercise regime is walking and performing stretching exercises.  [name] Morphine equivalent is 60.00 MME.   Last UDS was Performed on 01/07/2022, it was consistent.    Pain Inventory Average Pain 7 Pain Right Now 8 My pain is constant, sharp, burning, stabbing, and aching  In the last 24 hours, has pain interfered with the following? General activity 7 Relation with others 8 Enjoyment of life 10 What TIME of day is your pain at its worst? morning , evening, and night Sleep (in general) Poor  Pain is worse with: walking, bending, standing, and some activites Pain improves with: rest, heat/ice, and medication Relief from Meds: 6  Family History  Problem Relation Age of Onset   Hypertension Maternal Grandmother    Stroke Maternal Grandmother    Diabetes Maternal Grandfather    Cancer Mother        lung   Cancer Father    Diabetes Sister    Breast cancer Cousin    Heart attack Neg Hx    Social History   Socioeconomic History   Marital status: Single    Spouse name: Not on file   Number of children: Not on file   Years of education: Not on file   Highest education level: Not on file  Occupational History   Not on file  Tobacco Use   Smoking status: Never   Smokeless tobacco: Never  Vaping Use   Vaping Use: Never used  Substance and Sexual Activity   Alcohol use: No    Alcohol/week: 0.0 standard drinks of alcohol   Drug use: No   Sexual activity: Not Currently    Comment: 1st intercourse 89 yo-1 partner  Other Topics Concern   Not on file  Social History Narrative   Not on file   Social  Determinants of Health   Financial Resource Strain: Not on file  Food Insecurity: Not on file  Transportation Needs: Not on file  Physical Activity: Not on file  Stress: Not on file  Social Connections: Not on file   Past Surgical History:  Procedure Laterality Date   ABDOMINAL SURGERY     hernia repair   CHOLECYSTECTOMY  [id number]   COLONOSCOPY WITH PROPOFOL N/A 07/31/2016   Procedure: COLONOSCOPY WITH PROPOFOL;  Surgeon: Arta Silence, MD;  [facility]: [facility];  Service: Endoscopy;  Laterality: N/A;   COLONOSCOPY WITH PROPOFOL N/A 03/08/2020   Procedure: COLONOSCOPY WITH PROPOFOL;  Surgeon: Arta Silence, MD;  [facility];  Service: Endoscopy;  Laterality: N/A;   FOOT SURGERY Right 06/2007   IR RADIOLOGIST EVAL & MGMT  12/14/2019   KNEE ARTHROSCOPY  09/2008   left   KNEE ARTHROSCOPY  april 2011   left   NASAL SINUS SURGERY     NASAL SINUS SURGERY  1990   POLYPECTOMY  03/08/2020   Procedure: POLYPECTOMY;  Surgeon: Arta Silence, MD;  [facility];  Service: Endoscopy;;   SHOULDER SURGERY  2004   left   Past Surgical History:  Procedure Laterality Date   ABDOMINAL SURGERY     hernia repair  CHOLECYSTECTOMY  [id number]   COLONOSCOPY WITH PROPOFOL N/A 07/31/2016   Procedure: COLONOSCOPY WITH PROPOFOL;  Surgeon: Arta Silence, MD;  [facility];  Service: Endoscopy;  Laterality: N/A;   COLONOSCOPY WITH PROPOFOL N/A 03/08/2020   Procedure: COLONOSCOPY WITH PROPOFOL;  Surgeon: Arta Silence, MD;  [facility];  Service: Endoscopy;  Laterality: N/A;   FOOT SURGERY Right 06/2007   IR RADIOLOGIST EVAL & MGMT  12/14/2019   KNEE ARTHROSCOPY  09/2008   left   KNEE ARTHROSCOPY  april 2011   left   NASAL SINUS SURGERY     NASAL SINUS SURGERY  1990   POLYPECTOMY  03/08/2020   Procedure: POLYPECTOMY;  Surgeon: Arta Silence, MD;  [facility];  Service: Endoscopy;;   SHOULDER SURGERY  2004   left   Past Medical History:   Diagnosis Date   Anxiety    Asthma    Degenerative arthritis    in back   Depression    Diabetes mellitus    History of pulmonary embolus (PE)    Hypertension    Migraines    Near syncope    Obesity    PCOS (polycystic ovarian syndrome)    Peripheral edema    Sleep apnea    uses cpap set on 10   Stroke ([place]) 12/2011   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    BP (!) 155/79   Pulse 97   Ht '5\' 8"'$  (1.727 m)   Wt (!) 387 lb 9.6 oz (175.8 kg)   SpO2 96%   BMI 58.93 kg/m   Opioid Risk Score:   Fall Risk Score:  `1  Depression screen PHQ 2/9     07/10/2022    1:46 PM 05/14/2022    1:43 PM 03/13/2022    9:22 AM 01/07/2022   11:39 AM 11/07/2021   11:57 AM 09/10/2021   12:51 PM 07/10/2021    1:28 PM  Depression screen PHQ 2/9  Decreased Interest 0 1 2 0 '1 1 3  '$ Down, Depressed, Hopeless 0 '1 2 3 1 2 3  '$ PHQ - 2 Score 0 '2 4 3 2 3 6      '$ Review of Systems  Musculoskeletal:  Positive for back pain.       Right arm pain  All other systems reviewed and are negative.     Objective:   Physical Exam Vitals and nursing note reviewed.  Constitutional:      Appearance: Normal appearance. She is obese.  Cardiovascular:     Rate and Rhythm: Normal rate and regular rhythm.     Pulses: Normal pulses.     Heart sounds: Normal heart sounds.  Pulmonary:     Effort: Pulmonary effort is normal.     Breath sounds: Normal breath sounds.  Genitourinary:    Comments: Suprapubic Catheter : Clear yellow urine  Musculoskeletal:     Cervical back: Normal range of motion and neck supple.     Comments: Normal Muscle Bulk and Muscle Testing Reveals:  Upper Extremities: Full ROM and Muscle Strength  5/5  Lumbar Paraspinal Tenderness: L-3-L-5 Right Greater Trochanter Tenderness Lower Extremities: Full ROM and Muscle Strength 5/5 Arises from Table slowly Narrow Based  Gait     Skin:    General: Skin is warm and dry.  Neurological:     Mental Status: She is alert and oriented to person, place,  and time.  Psychiatric:        Mood and Affect: Mood normal.  Behavior: Behavior normal.         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 She verbalizes understanding. Marland Kitchen09/20/2023 Refilled: Oxycodone 10/325 mg one tablet every 8 hours as needed #90.  Second script e-scribe for the following month. We will continue the opioid monitoring program, this consists of regular clinic visits, examinations, urine drug screen, pill counts as well as use of [place] Controlled Substance Reporting system. A 12 month History has been reviewed on the [place] Controlled Substance Reporting System on 07/10/2022. 2. Morbid obesity: Continue Healthy Diet Regime and HEP. 09/20//2023 3. Type II diabetes:  Dr. Buddy Duty following. 07/10/2022 4. Reactive Depression: Continue Zoloft. 07/10/2022. 5. Myofascial Muscle Pain: Continue current medication regimen with Soma. 07/10/2022 6. Bilateral  Chronic Knee Pain: Continue HEP as Tolerated. Continue to Monitor. 07/10/2022 7. Right Greater Trochanter Bursitis:  Continue to alternate Ice and Heat Therapy. Continue to monitor. 07/10/2022   F/U in 2 months

## 2022-07-31 DIAGNOSIS — N3941 Urge incontinence: Secondary | ICD-10-CM | POA: Diagnosis not present

## 2022-07-31 DIAGNOSIS — Z466 Encounter for fitting and adjustment of urinary device: Secondary | ICD-10-CM | POA: Diagnosis not present

## 2022-08-19 DIAGNOSIS — Z23 Encounter for immunization: Secondary | ICD-10-CM | POA: Diagnosis not present

## 2022-08-19 DIAGNOSIS — Z794 Long term (current) use of insulin: Secondary | ICD-10-CM | POA: Diagnosis not present

## 2022-08-19 DIAGNOSIS — R059 Cough, unspecified: Secondary | ICD-10-CM | POA: Diagnosis not present

## 2022-08-19 DIAGNOSIS — E113293 Type 2 diabetes mellitus with mild nonproliferative diabetic retinopathy without macular edema, bilateral: Secondary | ICD-10-CM | POA: Diagnosis not present

## 2022-08-19 DIAGNOSIS — E1165 Type 2 diabetes mellitus with hyperglycemia: Secondary | ICD-10-CM | POA: Diagnosis not present

## 2022-08-19 DIAGNOSIS — I10 Essential (primary) hypertension: Secondary | ICD-10-CM | POA: Diagnosis not present

## 2022-08-19 DIAGNOSIS — E1122 Type 2 diabetes mellitus with diabetic chronic kidney disease: Secondary | ICD-10-CM | POA: Diagnosis not present

## 2022-08-27 DIAGNOSIS — E1122 Type 2 diabetes mellitus with diabetic chronic kidney disease: Secondary | ICD-10-CM | POA: Diagnosis not present

## 2022-09-04 DIAGNOSIS — Z9359 Other cystostomy status: Secondary | ICD-10-CM | POA: Diagnosis not present

## 2022-09-04 DIAGNOSIS — Z466 Encounter for fitting and adjustment of urinary device: Secondary | ICD-10-CM | POA: Diagnosis not present

## 2022-09-10 ENCOUNTER — Encounter: Payer: Medicare HMO | Attending: Registered Nurse | Admitting: Registered Nurse

## 2022-09-10 VITALS — BP 138/84 | HR 92 | Ht 68.0 in | Wt 388.0 lb

## 2022-09-10 DIAGNOSIS — M47816 Spondylosis without myelopathy or radiculopathy, lumbar region: Secondary | ICD-10-CM | POA: Diagnosis not present

## 2022-09-10 DIAGNOSIS — Z79891 Long term (current) use of opiate analgesic: Secondary | ICD-10-CM

## 2022-09-10 DIAGNOSIS — M7918 Myalgia, other site: Secondary | ICD-10-CM

## 2022-09-10 DIAGNOSIS — M1711 Unilateral primary osteoarthritis, right knee: Secondary | ICD-10-CM | POA: Diagnosis not present

## 2022-09-10 DIAGNOSIS — G894 Chronic pain syndrome: Secondary | ICD-10-CM | POA: Diagnosis not present

## 2022-09-10 DIAGNOSIS — Z5181 Encounter for therapeutic drug level monitoring: Secondary | ICD-10-CM | POA: Diagnosis not present

## 2022-09-10 DIAGNOSIS — M1712 Unilateral primary osteoarthritis, left knee: Secondary | ICD-10-CM | POA: Diagnosis not present

## 2022-09-10 MED ORDER — OXYCODONE-ACETAMINOPHEN 10-325 MG PO TABS: 1.0000 | ORAL_TABLET | Freq: Four times a day (QID) | ORAL | 0 refills | Status: DC | PRN

## 2022-09-10 MED ORDER — CARISOPRODOL 350 MG PO TABS: 350.0000 mg | ORAL_TABLET | Freq: Three times a day (TID) | ORAL | 1 refills | Status: DC

## 2022-09-10 NOTE — Progress Notes (Unsigned)
Subjective:    Patient ID: Kristen [name], female    DOB: [birth year], 56 y.o.   MRN: [id number]  HPI: Kristen [name] is a 56 y.o. female who returns for follow up appointment for chronic pain and medication refill. states *** pain is located in  ***. rates pain ***. current exercise regime is walking and performing stretching exercises.  Kristen [name] Morphine equivalent is 60.00 MME.   Oral Swab was Performed today.     Pain Inventory Average Pain 7 Pain Right Now 8 My pain is constant, sharp, burning, stabbing, and aching  In the last 24 hours, has pain interfered with the following? General activity 8 Relation with others 10 Enjoyment of life 9 What TIME of day is your pain at its worst? morning , daytime, evening, and night Sleep (in general) Poor  Pain is worse with: walking, bending, and standing Pain improves with: heat/ice, pacing activities, and medication Relief from Meds: 6  Family History  Problem Relation Age of Onset   Hypertension Maternal Grandmother    Stroke Maternal Grandmother    Diabetes Maternal Grandfather    Cancer Mother        lung   Cancer Father    Diabetes Sister    Breast cancer Cousin    Heart attack Neg Hx    Social History   Socioeconomic History   Marital status: Single    Spouse name: Not on file   Number of children: Not on file   Years of education: Not on file   Highest education level: Not on file  Occupational History   Not on file  Tobacco Use   Smoking status: Never   Smokeless tobacco: Never  Vaping Use   Vaping Use: Never used  Substance and Sexual Activity   Alcohol use: No    Alcohol/week: 0.0 standard drinks of alcohol   Drug use: No   Sexual activity: Not Currently    Comment: 1st intercourse 54 yo-1 partner  Other Topics Concern   Not on file  Social History Narrative   Not on file   Social Determinants of Health   Financial Resource Strain: Not on file  Food Insecurity: Not on file   Transportation Needs: Not on file  Physical Activity: Not on file  Stress: Not on file  Social Connections: Not on file   Past Surgical History:  Procedure Laterality Date   ABDOMINAL SURGERY     hernia repair   CHOLECYSTECTOMY  [id number]   COLONOSCOPY WITH PROPOFOL N/A 07/31/2016   Procedure: COLONOSCOPY WITH PROPOFOL;  Surgeon: Arta Silence, MD;  [facility];  Service: Endoscopy;  Laterality: N/A;   COLONOSCOPY WITH PROPOFOL N/A 03/08/2020   Procedure: COLONOSCOPY WITH PROPOFOL;  Surgeon: Arta Silence, MD;  [facility];  Service: Endoscopy;  Laterality: N/A;   FOOT SURGERY Right 06/2007   IR RADIOLOGIST EVAL & MGMT  12/14/2019   KNEE ARTHROSCOPY  09/2008   left   KNEE ARTHROSCOPY  april 2011   left   NASAL SINUS SURGERY     NASAL SINUS SURGERY  1990   POLYPECTOMY  03/08/2020   Procedure: POLYPECTOMY;  Surgeon: Arta Silence, MD;  [facility];  Service: Endoscopy;;   SHOULDER SURGERY  2004   left   Past Surgical History:  Procedure Laterality Date   ABDOMINAL SURGERY     hernia repair   CHOLECYSTECTOMY  [id number]   COLONOSCOPY WITH PROPOFOL N/A 07/31/2016   Procedure: COLONOSCOPY WITH PROPOFOL;  Surgeon:  Arta Silence, MD;  Location: [facility];  Service: Endoscopy;  Laterality: N/A;   COLONOSCOPY WITH PROPOFOL N/A 03/08/2020   Procedure: COLONOSCOPY WITH PROPOFOL;  Surgeon: Arta Silence, MD;  [facility];  Service: Endoscopy;  Laterality: N/A;   FOOT SURGERY Right 06/2007   IR RADIOLOGIST EVAL & MGMT  12/14/2019   KNEE ARTHROSCOPY  09/2008   left   KNEE ARTHROSCOPY  april 2011   left   NASAL SINUS SURGERY     NASAL SINUS SURGERY  1990   POLYPECTOMY  03/08/2020   Procedure: POLYPECTOMY;  Surgeon: Arta Silence, MD;  [facility];  Service: Endoscopy;;   SHOULDER SURGERY  2004   left   Past Medical History:  Diagnosis Date   Anxiety    Asthma    Degenerative arthritis    in back   Depression     Diabetes mellitus    History of pulmonary embolus (PE)    Hypertension    Migraines    Near syncope    Obesity    PCOS (polycystic ovarian syndrome)    Peripheral edema    Sleep apnea    uses cpap set on 10   Stroke ([place]) 12/2011   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       Bilateral knee pain      Objective:   Physical Exam        Assessment & Plan:

## 2022-09-12 ENCOUNTER — Encounter: Payer: Self-pay | Admitting: Registered Nurse

## 2022-09-13 LAB — DRUG TOX MONITOR 1 W/CONF, ORAL FLD
Amphetamines: NEGATIVE ng/mL (ref <10)
Barbiturates: NEGATIVE ng/mL (ref <10)
Benzodiazepines: NEGATIVE ng/mL (ref <0.50)
Buprenorphine: NEGATIVE ng/mL (ref <0.10)
Carisoprodol: 118.5 ng/mL — ABNORMAL HIGH (ref <2.5)
Cocaine: NEGATIVE ng/mL (ref <5.0)
Codeine: NEGATIVE ng/mL (ref <2.5)
Dihydrocodeine: NEGATIVE ng/mL (ref <2.5)
Fentanyl: NEGATIVE ng/mL (ref <0.10)
Heroin Metabolite: NEGATIVE ng/mL (ref <1.0)
Hydrocodone: NEGATIVE ng/mL (ref <2.5)
Hydromorphone: NEGATIVE ng/mL (ref <2.5)
MARIJUANA: NEGATIVE ng/mL (ref <2.5)
MDMA: NEGATIVE ng/mL (ref <10)
Meprobamate: >250 ng/mL — ABNORMAL HIGH (ref <2.5)
Meprobamate: POSITIVE ng/mL — AB (ref <2.5)
Methadone: NEGATIVE ng/mL (ref <5.0)
Morphine: NEGATIVE ng/mL (ref <2.5)
Nicotine Metabolite: NEGATIVE ng/mL (ref <5.0)
Norhydrocodone: NEGATIVE ng/mL (ref <2.5)
Noroxycodone: 32.4 ng/mL — ABNORMAL HIGH (ref <2.5)
Opiates: POSITIVE ng/mL — AB (ref <2.5)
Oxycodone: 42.5 ng/mL — ABNORMAL HIGH (ref <2.5)
Oxymorphone: NEGATIVE ng/mL (ref <2.5)
Phencyclidine: NEGATIVE ng/mL (ref <10)
Tapentadol: NEGATIVE ng/mL (ref <5.0)
Tramadol: NEGATIVE ng/mL (ref <5.0)
Zolpidem: NEGATIVE ng/mL (ref <5.0)

## 2022-09-13 LAB — DRUG TOX ALC METAB W/CON, ORAL FLD: Alcohol Metabolite: NEGATIVE ng/mL (ref <25)

## 2022-09-16 DIAGNOSIS — Z23 Encounter for immunization: Secondary | ICD-10-CM | POA: Diagnosis not present

## 2022-09-16 DIAGNOSIS — Z0000 Encounter for general adult medical examination without abnormal findings: Secondary | ICD-10-CM | POA: Diagnosis not present

## 2022-09-16 DIAGNOSIS — Z1389 Encounter for screening for other disorder: Secondary | ICD-10-CM | POA: Diagnosis not present

## 2022-09-18 DIAGNOSIS — E7800 Pure hypercholesterolemia, unspecified: Secondary | ICD-10-CM | POA: Diagnosis not present

## 2022-09-18 DIAGNOSIS — I10 Essential (primary) hypertension: Secondary | ICD-10-CM | POA: Diagnosis not present

## 2022-09-18 DIAGNOSIS — J45909 Unspecified asthma, uncomplicated: Secondary | ICD-10-CM | POA: Diagnosis not present

## 2022-09-18 DIAGNOSIS — D6859 Other primary thrombophilia: Secondary | ICD-10-CM | POA: Diagnosis not present

## 2022-09-18 DIAGNOSIS — Z86711 Personal history of pulmonary embolism: Secondary | ICD-10-CM | POA: Diagnosis not present

## 2022-09-18 DIAGNOSIS — F324 Major depressive disorder, single episode, in partial remission: Secondary | ICD-10-CM | POA: Diagnosis not present

## 2022-09-18 DIAGNOSIS — E1122 Type 2 diabetes mellitus with diabetic chronic kidney disease: Secondary | ICD-10-CM | POA: Diagnosis not present

## 2022-09-18 DIAGNOSIS — Z0000 Encounter for general adult medical examination without abnormal findings: Secondary | ICD-10-CM | POA: Diagnosis not present

## 2022-09-18 DIAGNOSIS — D649 Anemia, unspecified: Secondary | ICD-10-CM | POA: Diagnosis not present

## 2022-09-18 DIAGNOSIS — Z8639 Personal history of other endocrine, nutritional and metabolic disease: Secondary | ICD-10-CM | POA: Diagnosis not present

## 2022-09-18 DIAGNOSIS — N183 Chronic kidney disease, stage 3 unspecified: Secondary | ICD-10-CM | POA: Diagnosis not present

## 2022-09-18 DIAGNOSIS — J45991 Cough variant asthma: Secondary | ICD-10-CM | POA: Diagnosis not present

## 2022-10-04 ENCOUNTER — Emergency Department (HOSPITAL_COMMUNITY)
Admission: EM | Admit: 2022-10-04 | Discharge: 2022-10-05 | Disposition: A | Discharge status: Home / Self Care | Payer: Medicare HMO | Attending: Emergency Medicine | Admitting: Emergency Medicine

## 2022-10-04 ENCOUNTER — Other Ambulatory Visit: Payer: Self-pay

## 2022-10-04 ENCOUNTER — Emergency Department (HOSPITAL_COMMUNITY): Payer: Medicare HMO

## 2022-10-04 DIAGNOSIS — Z86711 Personal history of pulmonary embolism: Secondary | ICD-10-CM | POA: Insufficient documentation

## 2022-10-04 DIAGNOSIS — W010XXA Fall on same level from slipping, tripping and stumbling without subsequent striking against object, initial encounter: Secondary | ICD-10-CM | POA: Diagnosis not present

## 2022-10-04 DIAGNOSIS — R519 Headache, unspecified: Secondary | ICD-10-CM | POA: Diagnosis not present

## 2022-10-04 DIAGNOSIS — M25521 Pain in right elbow: Secondary | ICD-10-CM | POA: Diagnosis not present

## 2022-10-04 DIAGNOSIS — Z794 Long term (current) use of insulin: Secondary | ICD-10-CM | POA: Diagnosis not present

## 2022-10-04 DIAGNOSIS — Z7984 Long term (current) use of oral hypoglycemic drugs: Secondary | ICD-10-CM | POA: Insufficient documentation

## 2022-10-04 DIAGNOSIS — S52021A Displaced fracture of olecranon process without intraarticular extension of right ulna, initial encounter for closed fracture: Secondary | ICD-10-CM | POA: Insufficient documentation

## 2022-10-04 DIAGNOSIS — I10 Essential (primary) hypertension: Secondary | ICD-10-CM | POA: Insufficient documentation

## 2022-10-04 DIAGNOSIS — S52041A Displaced fracture of coronoid process of right ulna, initial encounter for closed fracture: Secondary | ICD-10-CM | POA: Diagnosis not present

## 2022-10-04 DIAGNOSIS — M542 Cervicalgia: Secondary | ICD-10-CM | POA: Diagnosis not present

## 2022-10-04 DIAGNOSIS — M79601 Pain in right arm: Secondary | ICD-10-CM | POA: Diagnosis present

## 2022-10-04 DIAGNOSIS — M1611 Unilateral primary osteoarthritis, right hip: Secondary | ICD-10-CM | POA: Diagnosis not present

## 2022-10-04 DIAGNOSIS — Z7982 Long term (current) use of aspirin: Secondary | ICD-10-CM | POA: Diagnosis not present

## 2022-10-04 DIAGNOSIS — E119 Type 2 diabetes mellitus without complications: Secondary | ICD-10-CM | POA: Diagnosis not present

## 2022-10-04 DIAGNOSIS — R739 Hyperglycemia, unspecified: Secondary | ICD-10-CM | POA: Diagnosis not present

## 2022-10-04 DIAGNOSIS — M549 Dorsalgia, unspecified: Secondary | ICD-10-CM | POA: Diagnosis not present

## 2022-10-04 DIAGNOSIS — S0990XA Unspecified injury of head, initial encounter: Secondary | ICD-10-CM | POA: Insufficient documentation

## 2022-10-04 DIAGNOSIS — Z043 Encounter for examination and observation following other accident: Secondary | ICD-10-CM | POA: Diagnosis not present

## 2022-10-04 DIAGNOSIS — Z7901 Long term (current) use of anticoagulants: Secondary | ICD-10-CM | POA: Insufficient documentation

## 2022-10-04 DIAGNOSIS — M25561 Pain in right knee: Secondary | ICD-10-CM | POA: Diagnosis not present

## 2022-10-04 DIAGNOSIS — Z79899 Other long term (current) drug therapy: Secondary | ICD-10-CM | POA: Diagnosis not present

## 2022-10-04 DIAGNOSIS — W19XXXA Unspecified fall, initial encounter: Secondary | ICD-10-CM | POA: Diagnosis not present

## 2022-10-04 DIAGNOSIS — M7981 Nontraumatic hematoma of soft tissue: Secondary | ICD-10-CM | POA: Diagnosis not present

## 2022-10-04 MED ORDER — FENTANYL CITRATE PF 50 MCG/ML IJ SOSY
100.0000 ug | PREFILLED_SYRINGE | Freq: Once | INTRAMUSCULAR | Status: AC
  Administered 2022-10-04: 100 ug via INTRAMUSCULAR
  Filled 2022-10-04: qty 2

## 2022-10-04 NOTE — ED Triage Notes (Addendum)
Mechanical fall on eliquis just prior to arrival. R elbow and knee pain, R head laceration. EMS reports possible deformity to R elbow with good PMS, arrives in sling.   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

## 2022-10-04 NOTE — ED Provider Triage Note (Signed)
Emergency Medicine Provider Triage Evaluation Note  Kristen [name] , a 56 y.o. female  was evaluated in triage.  Pt complains of trip and fall on blood thinners just prior to arrival.  Patient Dors is right elbow, right knee pain.  She has a small laceration to the right head versus excoriation.  Deformity noted to right elbow with normal pulses intact.  She arrives in sling, Sam splint.  Patient also endorses some right hip, right knee pain.  She reports his fall was mechanical in nature.  She denies loss of consciousness.  She endorses 10/10 pain, reports that she normally takes 10 mg Percocet 3 times daily.  Review of Systems  Positive: Fall, head injury, arm, hip, and knee pain Negative: Loss of consciousness, dizziness, vision changes  Physical Exam  BP (!) 167/87 (BP Location: Left Wrist)   Pulse 86   Temp 97.9 F (36.6 C) (Oral)   Resp 18   Ht '5\' 8"'$  (1.727 m)   Wt (!) 174.2 kg   SpO2 96%   BMI 58.39 kg/m  Gen:   Awake, morbid obesity Resp:  Normal effort  MSK:   Decreased range of motion secondary to pain Other:  Tenderness palpation right shoulder, deformity noted of right elbow, intact pulses 2+ radial and ulnar on the right, tenderness palpation over the right hip without step-off or deformity, tenderness to palpation of right knee without effusion, patient is able to move legs with some difficulty  Medical Decision Making  Medically screening exam initiated at 9:01 PM.  Appropriate orders placed.  Richard Holz was informed that the remainder of the evaluation will be completed by another provider, this initial triage assessment does not replace that evaluation, and the importance of remaining in the ED until their evaluation is complete.  Workup initiated   Anselmo Pickler, [place] 10/04/22 2101

## 2022-10-05 ENCOUNTER — Emergency Department (HOSPITAL_COMMUNITY): Payer: Medicare HMO

## 2022-10-05 DIAGNOSIS — S52021A Displaced fracture of olecranon process without intraarticular extension of right ulna, initial encounter for closed fracture: Secondary | ICD-10-CM | POA: Diagnosis not present

## 2022-10-05 DIAGNOSIS — S52041A Displaced fracture of coronoid process of right ulna, initial encounter for closed fracture: Secondary | ICD-10-CM | POA: Diagnosis not present

## 2022-10-05 DIAGNOSIS — M7981 Nontraumatic hematoma of soft tissue: Secondary | ICD-10-CM | POA: Diagnosis not present

## 2022-10-05 MED ORDER — OXYCODONE-ACETAMINOPHEN 5-325 MG PO TABS
2.0000 | ORAL_TABLET | Freq: Once | ORAL | Status: DC
  Filled 2022-10-05: qty 2

## 2022-10-05 MED ORDER — MORPHINE SULFATE (PF) 4 MG/ML IV SOLN
4.0000 mg | Freq: Once | INTRAVENOUS | Status: AC
  Administered 2022-10-05: 4 mg via INTRAVENOUS
  Filled 2022-10-05: qty 1

## 2022-10-05 MED ORDER — SODIUM CHLORIDE 0.9 % IV SOLN
12.5000 mg | Freq: Once | INTRAVENOUS | Status: AC
  Administered 2022-10-05: 12.5 mg via INTRAVENOUS
  Filled 2022-10-05: qty 0.5

## 2022-10-05 MED ORDER — OXYCODONE-ACETAMINOPHEN 5-325 MG PO TABS
2.0000 | ORAL_TABLET | Freq: Once | ORAL | Status: AC
  Administered 2022-10-05: 2 via ORAL
  Filled 2022-10-05: qty 2

## 2022-10-05 NOTE — Progress Notes (Signed)
Orthopedic Tech Progress Note Patient Details:  Kristen [name] Jan 16, 1966 [id number]  Ortho Devices Type of Ortho Device: Long arm splint, Arm sling Ortho Device/Splint Location: Right arm Ortho Device/Splint Interventions: Application   Post Interventions Patient Tolerated: Well Instructions Provided: Care of device  [name] E [name] 10/05/2022, 9:14 AM

## 2022-10-05 NOTE — Discharge Instructions (Addendum)
You have been seen today for your complaint of fall and right elbow pain. Your imaging showed a fracture of your right elbow, was otherwise unremarkable. Your discharge medications include your home opioid therapy.  He should take this as needed for pain. Home care instructions are as follows:  Ice the affected extremity for 15 minutes at a time multiple times throughout the day. Follow up with: Dr. Mable Fill.  He is an Doctor, general practice.  He should call as soon as possible to schedule an appointment for an ED follow-up visit for your fractured elbow.  Please seek immediate medical care if you develop any of the following symptoms: A feeling of tightness or fullness in the affected area. Numbness. Weakness in the affected area. Loss of movement. Skin becoming pale, tight, and shiny over the painful area. Warmth and tenderness. Tensing when the affected area is touched. At this time there does not appear to be the presence of an emergent medical condition, however there is always the potential for conditions to change. Please read and follow the below instructions.  Do not take your medicine if  develop an itchy rash, swelling in your mouth or lips, or difficulty breathing; call 911 and seek immediate emergency medical attention if this occurs.  You may review your lab tests and imaging results in their entirety on your MyChart account.  Please discuss all results of fully with your primary care provider and other specialist at your follow-up visit.  Note: Portions of this text may have been transcribed using voice recognition software. Every effort was made to ensure accuracy; however, inadvertent computerized transcription errors may still be present.

## 2022-10-05 NOTE — ED Notes (Signed)
Pain medication held due to low BP. Pt aware, verbalized understanding.

## 2022-10-05 NOTE — ED Notes (Signed)
Right elbow pain. Distal CMS intact.

## 2022-10-05 NOTE — ED Notes (Signed)
ED Provider at bedside. 

## 2022-10-05 NOTE — ED Provider Notes (Signed)
[facility] EMERGENCY DEPARTMENT Provider Note   CSN: [id number] Arrival date & time: 10/04/22  2040     History  Chief Complaint  Patient presents with   Fall    [place] is a 56 y.o. female.  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  She states she was walking in her bedroom when she tripped over a stool.  She fell forward and hit her head on the dresser and landed on her right hip and elbow.  She did not lose consciousness.  She was unable to stand up because her right arm hurt and her left arm is strong to support her.  Called fire who performed a lift assist.  She was then able to walk to the living room and wait for EMS.  Denies headaches, vision changes, numbness, tingling.  States she gets nauseous often at home and takes promethazine for this, but has not taken any since she arrived.  She has unfortunately been waiting in the ED waiting room for 10 hours prior to my assessment.   Fall       Home Medications Prior to Admission medications   Medication Sig Start Date End Date Taking? Authorizing Provider  acetaminophen (TYLENOL) 500 MG tablet Take 1,000 mg by mouth every 6 (six) hours as needed for moderate pain.    [provider]  albuterol (ACCUNEB) 1.25 MG/3ML nebulizer solution Inhale 1 ampule into the lungs 3 (three) times daily as needed for wheezing or shortness of breath.  12/12/10   [provider]  albuterol (VENTOLIN HFA) 108 (90 Base) MCG/ACT inhaler Inhale 2 puffs into the lungs every 4 (four) hours as needed for wheezing or shortness of breath.  01/04/14   [provider]  albuterol (VENTOLIN HFA) 108 (90 Base) MCG/ACT inhaler 1-2 puff as needed    [provider]  apixaban (ELIQUIS) 2.5 MG TABS tablet Take 1 tablet (2.5 mg total) by mouth 2 (two)  times daily. 03/09/20   Arta Silence, MD  aspirin EC 81 MG tablet Take 81 mg by mouth daily.    [provider]  atorvastatin (LIPITOR) 40 MG tablet Take 40 mg by mouth daily.    [provider]  Blood Glucose Monitoring Suppl (TRUE METRIX AIR GLUCOSE METER) DEVI See admin instructions. 03/14/21   [provider]  Blood Glucose Monitoring Suppl (TRUE METRIX AIR GLUCOSE METER) DEVI check blood sugar 03/14/21   [provider]  buPROPion (WELLBUTRIN XL) 150 MG 24 hr tablet Take 150 mg by mouth every morning. 300 mg total 09/13/19   [provider]  buPROPion (WELLBUTRIN XL) 300 MG 24 hr tablet 1 tablet in the morning orally once a day 08/16/19   [provider]  carisoprodol (SOMA) 350 MG tablet Take 1 tablet (350 mg total) by mouth every 8 (eight) hours. 09/10/22   Bayard Hugger, NP  Cholecalciferol (VITAMIN D3) 125 MCG (5000 UT) TABS Take 5,000 Units by mouth daily.     [provider]  exenatide (BYETTA) 5 MCG/0.02ML SOPN injection Inject into the skin.    [provider]  Fluticasone-Salmeterol (ADVAIR) 500-50 MCG/DOSE AEPB Inhale 1 puff into the lungs every 12 (twelve) hours.    [provider]  gabapentin (NEURONTIN) 100 MG capsule TAKE 2 CAPSULES IN THE MORNING, TAKE 1 CAPSULE IN  THE AFTERNOON, AND TAKE 1 CAPSULE AT BEDTIME 06/15/21   Danella Sensing L, NP  glimepiride (AMARYL) 4 MG tablet Take by mouth.    [provider]  ibuprofen (ADVIL) 800 MG tablet Take by mouth.    [provider]  insulin aspart (NOVOLOG FLEXPEN) 100 UNIT/ML FlexPen Inject 15 Units into the skin 3 (three) times daily with meals. Patient taking differently: Inject 44 Units into the skin 3 (three) times daily with meals. 03/24/17   Florencia Reasons, MD  insulin glargine (LANTUS) 100 UNIT/ML injection Inject 0.6 mLs (60 Units total) into the skin at bedtime. Patient taking differently: Inject 80 Units into the skin at bedtime.  11/12/19   Swayze, Ava, DO  Lancets MISC use to check blood sugar 01/05/21   [provider]  lisinopril (ZESTRIL) 20 MG tablet Take 20 mg by mouth daily.    [provider]  montelukast (SINGULAIR) 10 MG tablet Take 10 mg by mouth at bedtime.    [provider]  oxybutynin (DITROPAN XL) 15 MG 24 hr tablet Take 30 mg by mouth at bedtime.    [provider]  oxyCODONE-acetaminophen (PERCOCET) 10-325 MG tablet Take 1 tablet by mouth every 6 (six) hours as needed for pain. Do Not Fill Before 10/09/2022 09/10/22   Bayard Hugger, NP  pravastatin (PRAVACHOL) 40 MG tablet Take by mouth.    [provider]  promethazine (PHENERGAN) 25 MG tablet Take 25 mg by mouth every 6 (six) hours as needed for nausea or vomiting.     [provider]  senna-docusate (SENOKOT-S) 8.6-50 MG tablet Take 1 tablet by mouth at bedtime. 03/24/17   Florencia Reasons, MD  sertraline (ZOLOFT) 100 MG tablet Take 200 mg by mouth daily.     [provider]      Allergies    Cafergot, Glucophage [metformin hcl], Metformin, Canagliflozin, Ergotamine-caffeine, and Ergotamine    Review of Systems   Review of Systems  Musculoskeletal:  Positive for arthralgias, joint swelling and myalgias.  All other systems reviewed and are negative.   Physical Exam Updated Vital Signs BP (!) 150/79   Pulse 75   Temp 98.1 F (36.7 C) (Oral)   Resp 18   Ht '5\' 8"'$  (1.727 m)   Wt (!) 174.2 kg   SpO2 98%   BMI 58.39 kg/m  Physical Exam Vitals and nursing note reviewed.  Constitutional:      General: She is not in acute distress.    Appearance: Normal appearance. She is well-developed. She is obese. She is not ill-appearing, toxic-appearing or diaphoretic.  HENT:     Head: Normocephalic and atraumatic.  Eyes:     Conjunctiva/sclera: Conjunctivae normal.  Cardiovascular:     Rate and Rhythm: Normal rate and regular rhythm.     Pulses: Normal pulses.          Radial pulses are 2+ on  the right side and 2+ on the left side.       Dorsalis pedis pulses are 2+ on the right side and 2+ on the left side.     Heart sounds: No murmur heard. Pulmonary:     Effort: Pulmonary effort is normal. No respiratory distress.     Breath sounds: Normal breath sounds. No stridor. No wheezing, rhonchi or rales.  Abdominal:     Palpations: Abdomen is soft.     Tenderness: There is no abdominal tenderness.  Musculoskeletal:        General: Swelling and tenderness present.  Cervical back: Neck supple.     Right lower leg: 1+ Edema present.     Left lower leg: 1+ Edema present.     Comments: Swelling and deformity to the right elbow.  EMS applied splint in place.  Significant tenderness to palpation of the right elbow.  Patient is ambulatory in the ED.  No tenderness to palpation of the right hip or elbow.  Skin:    General: Skin is warm and dry.     Capillary Refill: Capillary refill takes less than 2 seconds.     Findings: Lesion present.     Comments: Small linear abrasion to the right temple  Neurological:     General: No focal deficit present.     Mental Status: She is alert and oriented to person, place, and time.     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 No focal neurologic deficit. Sensation intact in all extremities  Psychiatric:        Mood and Affect: Mood normal.     ED Results / Procedures / Treatments   Labs (all labs ordered are listed, but only abnormal results are displayed) Labs Reviewed - No data to display  EKG None  Radiology CT Head Wo Contrast  Result Date: 10/04/2022 CLINICAL DATA:  Fall injury with right-sided head laceration and neck pain. EXAM: CT HEAD WITHOUT CONTRAST CT CERVICAL SPINE WITHOUT CONTRAST TECHNIQUE: Multidetector CT imaging of the head and cervical spine was performed following the standard protocol without intravenous contrast. Multiplanar CT image reconstructions of the cervical spine were also generated. RADIATION DOSE REDUCTION:  This exam was performed according to the departmental dose-optimization program which includes automated exposure control, adjustment of the mA and/or kV according to patient size and/or use of iterative reconstruction technique. COMPARISON:  Head CT dated 08/02/2012. No prior films or cross-sectional imaging of the cervical spine. FINDINGS: CT HEAD FINDINGS Brain: No evidence of acute infarction, hemorrhage, hydrocephalus, extra-axial collection or mass lesion/mass effect. Vascular: No hyperdense vessel or unexpected calcification. Skull: No scalp hematoma is seen. Negative for fractures or focal lesions. The calvarium, skull base and orbits are intact. Sinuses/Orbits: Unremarkable orbital contents. There is mild membrane disease in the sinuses without fluid levels. There is evidence of prior ethmoidectomies, partial left middle turbinectomy and bilateral maxillary antral window procedures. The bilateral mastoid air cells and middle ear cavities are clear. There is mild right deviation of the nasal septum. Other: None. CT CERVICAL SPINE FINDINGS Alignment: Normal. Skull base and vertebrae: Beam hardening artifact limits image resolution, in part due to habitus and in part due to superimposition of the patient's shoulders, from C3-4 down. No displaced fracture or spinal compression injury is seen, but sensitivity is reduced. No primary pathologic process is suspected. Soft tissues and spinal canal: No prevertebral fluid or swelling. No visible canal hematoma. Calcifications are noted in both proximal cervical ICAs. No thyroid mass or laryngeal abnormality. Disc levels: There is preservation of the normal cervical disc heights. There is no obvious herniated disc. Mild posterior disc bulges are noted at C2-3, C3-4, C4-5 and C5-6 but no cord compromise is suspected allowing for beam hardening artifacts. There are slight facet joint spurring changes at most levels but no foraminal stenosis is seen. Upper chest:  Negative. Other: None. IMPRESSION: 1. No acute intracranial CT findings or depressed skull fractures. 2. 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 3. No evidence of cervical fractures or malalignment, but evaluation limited due to habitus and positioning. 4. Mild  degenerative changes of the cervical spine. 5. Carotid atherosclerosis. Electronically Signed   By: Telford Nab M.D.   On: 10/04/2022 23:39   CT Cervical Spine Wo Contrast  Result Date: 10/04/2022 CLINICAL DATA:  Fall injury with right-sided head laceration and neck pain. EXAM: CT HEAD WITHOUT CONTRAST CT CERVICAL SPINE WITHOUT CONTRAST TECHNIQUE: Multidetector CT imaging of the head and cervical spine was performed following the standard protocol without intravenous contrast. Multiplanar CT image reconstructions of the cervical spine were also generated. RADIATION DOSE REDUCTION: This exam was performed according to the departmental dose-optimization program which includes automated exposure control, adjustment of the mA and/or kV according to patient size and/or use of iterative reconstruction technique. COMPARISON:  Head CT dated 08/02/2012. No prior films or cross-sectional imaging of the cervical spine. FINDINGS: CT HEAD FINDINGS Brain: No evidence of acute infarction, hemorrhage, hydrocephalus, extra-axial collection or mass lesion/mass effect. Vascular: No hyperdense vessel or unexpected calcification. Skull: No scalp hematoma is seen. Negative for fractures or focal lesions. The calvarium, skull base and orbits are intact. Sinuses/Orbits: Unremarkable orbital contents. There is mild membrane disease in the sinuses without fluid levels. There is evidence of prior ethmoidectomies, partial left middle turbinectomy and bilateral maxillary antral window procedures. The bilateral mastoid air cells and middle ear cavities are clear. There is mild right deviation of the nasal septum. Other: None. CT CERVICAL SPINE FINDINGS  Alignment: Normal. Skull base and vertebrae: Beam hardening artifact limits image resolution, in part due to habitus and in part due to superimposition of the patient's shoulders, from C3-4 down. No displaced fracture or spinal compression injury is seen, but sensitivity is reduced. No primary pathologic process is suspected. Soft tissues and spinal canal: No prevertebral fluid or swelling. No visible canal hematoma. Calcifications are noted in both proximal cervical ICAs. No thyroid mass or laryngeal abnormality. Disc levels: There is preservation of the normal cervical disc heights. There is no obvious herniated disc. Mild posterior disc bulges are noted at C2-3, C3-4, C4-5 and C5-6 but no cord compromise is suspected allowing for beam hardening artifacts. There are slight facet joint spurring changes at most levels but no foraminal stenosis is seen. Upper chest: Negative. Other: None. IMPRESSION: 1. No acute intracranial CT findings or depressed skull fractures. 2. 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 3. No evidence of cervical fractures or malalignment, but evaluation limited due to habitus and positioning. 4. Mild degenerative changes of the cervical spine. 5. Carotid atherosclerosis. Electronically Signed   By: Telford Nab M.D.   On: 10/04/2022 23:39   DG Hip Unilat W or Wo Pelvis 2-3 Views Right  Result Date: 10/04/2022 CLINICAL DATA:  Fall. EXAM: DG HIP (WITH OR WITHOUT PELVIS) 2-3V RIGHT COMPARISON:  None Available. FINDINGS: There is no evidence of hip fracture or dislocation. 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 IMPRESSION: 1. No fracture or dislocation. 2. 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 Electronically Signed   By: Keane Police D.O.   On: 10/04/2022 21:49   DG Knee Complete 4 Views Right  Result Date: 10/04/2022 CLINICAL DATA:  Fall.  Right knee pain. EXAM: RIGHT KNEE - COMPLETE 4+ VIEW COMPARISON:  None Available. FINDINGS: No evidence of  fracture or dislocation. Mild tricompartmental joint space narrowing with marginal osteophytes prominent in the medial tibiofemoral and patellofemoral compartments. Small suprapatellar knee joint effusion. Soft tissues are unremarkable. IMPRESSION: 1. No evidence of fracture or dislocation. 2. Mild-to-moderate tricompartmental osteoarthritis. Electronically Signed   By: Keane Police D.O.   On: 10/04/2022  21:47   DG Elbow Complete Right  Result Date: 10/04/2022 CLINICAL DATA:  Fall.  Right elbow pain. EXAM: RIGHT ELBOW - COMPLETE 3+ VIEW COMPARISON:  None Available. FINDINGS: There is a displaced fracture of the olecranon process. Elevation of the anterior and posterior fat pads suggesting joint effusion. Radiocapitellar and ulnotrochlear joint space narrowing with marginal spurring. IMPRESSION: Displaced fracture of the olecranon process. Cross-sectional imaging for further evaluation is suggested. Electronically Signed   By: Keane Police D.O.   On: 10/04/2022 21:46   DG Shoulder Right  Result Date: 10/04/2022 CLINICAL DATA:  Fall. EXAM: RIGHT SHOULDER - 2+ VIEW COMPARISON:  None Available. FINDINGS: There is no evidence of fracture or dislocation. Moderate acromioclavicular osteoarthritis. Soft tissues are unremarkable. IMPRESSION: 1. No fracture or dislocation. 2. Moderate acromioclavicular osteoarthritis. Electronically Signed   By: Keane Police D.O.   On: 10/04/2022 21:44    Procedures Procedures    Medications Ordered in ED Medications  promethazine (PHENERGAN) 12.5 mg in sodium chloride 0.9 % 50 mL IVPB (has no administration in time range)  morphine (PF) 4 MG/ML injection 4 mg (has no administration in time range)  fentaNYL (SUBLIMAZE) injection 100 mcg (100 mcg Intramuscular Given 10/04/22 2103)  oxyCODONE-acetaminophen (PERCOCET/ROXICET) 5-325 MG per tablet 2 tablet (2 tablets Oral Given 10/05/22 0243)    ED Course/ Medical Decision Making/ A&P Clinical Course as of 10/05/22 0835   Sat Oct 05, 2022  0729 Spoke with orthopedics Dr. Mable Fill.  He recommends CT of the elbow, posterior long-arm splint, follow-up in office. [AS]    Clinical Course User Index [AS] Miosotis Wetsel, G[place]on Folk, PA-C                           Medical Decision Making Amount and/or Complexity of Data Reviewed Radiology: ordered.  Risk Prescription drug management.  This patient presents to the ED for concern of fall, R elbow pain, this involves an extensive number of treatment options, and is a complaint that carries with it a high risk of complications and morbidity.  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 EMS provides a portion of the history  I ordered imaging studies including CT head, c spine, R elbow, DC hip/pelvis, R knee, R elbow, R shoulder I independently visualized and interpreted imaging which showed no intracranial or C-spine abnormalities.  Displaced fracture of the olecranon process of the right elbow. I agree with the radiologist interpretation  Consultations Obtained:  I requested consultation with the orthopedics Dr. Mable Fill,  and discussed lab and imaging findings as well as pertinent plan - they recommend: CT, posterior long-arm splint, sling, follow-up in office  Afebrile, hemodynamically stable.  56 year old female presents the ED for evaluation for fall.  She did hit her head and she is on Eliquis for DVT prophylaxis, however did not lose consciousness and has a normal CT head.  No neurologic deficits on exam.  She is complaining of right elbow and right knee pain.  Right knee is mildly tender to palpation the patient is ambulatory in the  ED.  Right elbow has a displaced olecranon fracture.  Her neurovascular status is intact.  There are no signs of compartment syndrome on my evaluation.  Orthopedics was consulted and recommends posterior long-arm splint, CT for further evaluation and follow-up in office.  Patient does take oxycodone 10 mg for chronic back pain.  This was given prior to discharge.  I have low suspicion for other injuries or intracranial abnormalities as patient appears overall well on exam.  She was encouraged to call as soon as possible for an ED follow-up visit.  She was given return precautions including compartment syndrome symptoms.  Stable at discharge.  At this time there does not appear to be any evidence of an acute emergency medical condition and the patient appears stable for discharge with appropriate outpatient follow up. Diagnosis was discussed with patient who verbalizes understanding of care plan and is agreeable to discharge. I have discussed return precautions with patient who verbalizes understanding. Patient encouraged to follow-up with their PCP within 1 week. All questions answered.  Patient's case discussed with Dr. Billy Fischer who agrees with plan to discharge with follow-up.   Note: Portions of this report may have been transcribed using voice recognition software. Every effort was made to ensure accuracy; however, inadvertent computerized transcription errors may still be present.          Final Clinical Impression(s) / ED Diagnoses Final diagnoses:  Closed fracture of olecranon process of right ulna, initial encounter    Rx / DC Orders ED Discharge Orders     None         Roylene Reason, PA-C 10/05/22 2863    Gareth Morgan, MD 10/07/22 1523

## 2022-10-07 ENCOUNTER — Telehealth: Payer: Self-pay

## 2022-10-07 DIAGNOSIS — N3946 Mixed incontinence: Secondary | ICD-10-CM | POA: Diagnosis not present

## 2022-10-07 DIAGNOSIS — Z466 Encounter for fitting and adjustment of urinary device: Secondary | ICD-10-CM | POA: Diagnosis not present

## 2022-10-07 DIAGNOSIS — Z9359 Other cystostomy status: Secondary | ICD-10-CM | POA: Diagnosis not present

## 2022-10-07 MED ORDER — GABAPENTIN 100 MG PO CAPS: ORAL_CAPSULE | ORAL | 1 refills | Status: DC

## 2022-10-07 NOTE — Telephone Encounter (Signed)
Patient called in stating that she needs a refill on her gabapentin, she had a pretty bad fall on Friday that gave her a concussion, a broken elbow and very limited mobility. Wants it sent to [place].

## 2022-10-07 NOTE — Telephone Encounter (Signed)
Placed a call to [name],  She had a fall on 10/04/2022, she was evaluated at [facility]. Note was reviewed.  Gabapentin refill today, she verbalizes understanding.

## 2022-10-09 ENCOUNTER — Other Ambulatory Visit: Payer: Self-pay | Admitting: Orthopedic Surgery

## 2022-10-09 ENCOUNTER — Telehealth: Payer: Self-pay

## 2022-10-09 DIAGNOSIS — S52031A Displaced fracture of olecranon process with intraarticular extension of right ulna, initial encounter for closed fracture: Secondary | ICD-10-CM | POA: Diagnosis not present

## 2022-10-09 NOTE — Telephone Encounter (Signed)
Patient states is having orthopedic surgery-[facility] next week and she states that the surgeon is not going to give her any pain meds, when she leaves the hospital  since she is already on a pain regiment and a letter will be sent to [place] about it.

## 2022-10-10 ENCOUNTER — Telehealth: Payer: Self-pay | Admitting: Registered Nurse

## 2022-10-10 NOTE — Telephone Encounter (Signed)
My-Chart message was sent: no reply:  Call placed to Kristen [name], she states she had a bowel movement today.

## 2022-10-11 ENCOUNTER — Other Ambulatory Visit: Payer: Self-pay

## 2022-10-11 ENCOUNTER — Encounter (HOSPITAL_COMMUNITY): Payer: Self-pay | Admitting: Orthopedic Surgery

## 2022-10-11 NOTE — Progress Notes (Signed)
PCP - Lawerance Cruel, MD Cardiologist - denies  PPM/ICD - denies  Chest x-ray - n/a EKG - 02/19/22 - tracing requested ECHO - 03/22/17 Cardiac Cath - denies  CPAP - positive for OSA - not wearing CPAP  Fasting Blood Sugar - 130 - 150 Checks Blood Sugar twice/day A1C - 8.5 on 02/19/22  Blood Thinner Instructions: Eliquis - last dose 10/11/22 per patient Aspirin Instructions - last dose - 10/09/22 per patient Patient was instructed: As of today, STOP taking any Aspirin (unless otherwise instructed by your surgeon) Aleve, Naproxen, Ibuprofen, Motrin, Advil, Goody's, BC's, all herbal medications, fish oil, and all vitamins.  ERAS Protcol - yes, until 09:15 o'clock  COVID TEST- n/a  Anesthesia review: yes - history of multiple PEs, Stroke  Patient verbally denies any shortness of breath, fever, cough and chest pain during phone call   -------------  SDW INSTRUCTIONS given:  Your procedure is scheduled on Tuesday, December 26th, 2023.  Report to [facility] Main Entrance "A" at 09:45 A.M., and check in at the Admitting office.  Call this number if you have problems the morning of surgery:  [phone]   Remember:  Do not eat after midnight the night before your surgery  You may drink clear liquids until 09:15 the morning of your surgery.   Clear liquids allowed are: Water, Non-Citrus Juices (without pulp), Carbonated Beverages, Clear Tea, Black Coffee Only, and Gatorade    Take these medicines the morning of surgery with A SIP OF WATER - Lipitor,Bupropion PRN: Tylenol, Oxycodone, Phenergan, inhalers, nebulizer Please bring all inhalers with you the day of surgery.     THE NIGHT BEFORE SURGERY, take 40 units Lantus (50% of your regular dose)     THE MORNING OF SURGERY hold Novolog  If your CBG is greater than 220 mg/dL, you may take  of your sliding scale (correction) dose of insulin.   How do I manage my blood sugar before surgery? Check your blood sugar at  least 4 times a day, starting 2 days before surgery, to make sure that the level is not too high or low.  Check your blood sugar the morning of your surgery when you wake up and every 2 hours until you get to the Short Stay unit.  If your blood sugar is less than 70 mg/dL, you will need to treat for low blood sugar: Do not take insulin. Treat a low blood sugar (less than 70 mg/dL) with  cup of clear juice (cranberry or apple), 4 glucose tablets, OR glucose gel. Recheck blood sugar in 15 minutes after treatment (to make sure it is greater than 70 mg/dL). If your blood sugar is not greater than 70 mg/dL on recheck, call [phone] for further instructions. Report your blood sugar to the short stay nurse when you get to Short Stay.   The day of surgery:                     Do not wear jewelry, make up, or nail polish            Do not wear lotions, powders, perfumes, or deodorant.            Do not shave 48 hours prior to surgery.              Do not bring valuables to the hospital.            [facility] is not responsible for any belongings or valuables.  Do NOT Smoke (Tobacco/Vaping) 24 hours prior to your procedure If you use a CPAP at night, you may bring all equipment for your overnight stay.   Contacts, glasses, dentures or bridgework may not be worn into surgery.      For patients admitted to the hospital, discharge time will be determined by your treatment team.   Patients discharged the day of surgery will not be allowed to drive home, and someone needs to stay with them for 24 hours.    Special instructions:   [facility]- Preparing For Surgery  Before surgery, you can play an important role. Because skin is not sterile, your skin needs to be as free of germs as possible. You can reduce the number of germs on your skin by washing with CHG (chlorahexidine gluconate) Soap before surgery.  CHG is an antiseptic cleaner which kills germs and bonds with the skin to continue  killing germs even after washing.    Oral Hygiene is also important to reduce your risk of infection.  Remember - BRUSH YOUR TEETH THE MORNING OF SURGERY WITH YOUR REGULAR TOOTHPASTE  Please do not use if you have an allergy to CHG or antibacterial soaps. If your skin becomes reddened/irritated stop using the CHG.  Do not shave (including legs and underarms) for at least 48 hours prior to first CHG shower. It is OK to shave your face.  Please follow these instructions carefully.   Shower the NIGHT BEFORE SURGERY and the MORNING OF SURGERY with DIAL Soap.   Pat yourself dry with a CLEAN TOWEL.  Wear CLEAN PAJAMAS to bed the night before surgery  Place CLEAN SHEETS on your bed the night of your first shower and DO NOT SLEEP WITH PETS.   Day of Surgery: Please shower morning of surgery  Wear Clean/Comfortable clothing the morning of surgery Do not apply any deodorants/lotions.   Remember to brush your teeth WITH YOUR REGULAR TOOTHPASTE.   Questions were answered. Patient verbalized understanding of instructions.

## 2022-10-11 NOTE — Progress Notes (Signed)
Anesthesia Chart Review: Kristen [name]  Case: [id number] Date/Time: 10/15/22 1201   Procedure: OPEN REDUCTION INTERNAL FIXATION (ORIF) OLECRANON FRACTURE (Right: Elbow)   Anesthesia type: Choice   Pre-op diagnosis: RIGHT INTRA ARTICULAR OLECRONON FRACTURE   Location: [facility] OR TO BE SCHEDULED ROOM 01 - ADD ON / [place] OR   Surgeons: Georgeanna Harrison, MD       DISCUSSION: Patient is a 56 year old female scheduled for the above procedure. She had a mechanical fall on 10/04/22 and sustained a right elbow fracture.  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 Alona Bene ([facility]).  On 10/09/22 she contacted Danella Sensing, NP with [facility] for pain management plan with upcoming surgery.   She is a same-day workup, so anesthesia team to evaluate on the day of surgery. She reported last Eliquis 10/11/22, last ASA 10/09/22.  VS: BP Readings from Last 3 Encounters:  10/05/22 127/67  09/10/22 138/84  07/10/22 (!) 155/79   Pulse Readings from Last 3 Encounters:  10/05/22 74  09/10/22 92  07/10/22 97     PROVIDERS: Lawerance Cruel, MD is PCP  Alona Bene, MD is urologist   LABS:  For day of surgery as indicated. Creatinine 1.18, A1c 8.5% 02/19/22 ([facility]).    IMAGES: CT Right Elbow 10/05/22: IMPRESSION: 1. Acute comminuted fracture through the olecranon process of the proximal ulna with moderate distraction. 2. Mild posterior subluxation of the trochlea relative to the proximal ulna without dislocation. 3. Moderate-sized elbow joint lipohemarthrosis. 4. Prominent soft tissue swelling with ill-defined hematoma at the posterior aspect of the elbow and proximal forearm. 5. 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  CT Head/C-spine 10/05/22: IMPRESSION: 1. No acute intracranial CT findings or depressed skull fractures. 2. 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 3. No evidence of cervical fractures or malalignment, but evaluation limited due to habitus and positioning. 4. Mild degenerative changes of the cervical spine. 5. Carotid atherosclerosis.    EKG:  EKG 02/19/22 ([facility]): Tracing requested. Per result narrative in Care Everywhere: Sinus rhythm  Normal ECG  When compared with ECG of 08-May-2020 14:25,  No significant change was found  Confirmed by Limon, Mel Almond [id number]) on 02/20/2022 6:16:54 PM    CV: Echo 03/22/17 (in setting of PE): Study Conclusions  - Left ventricle: The cavity size was normal. Systolic function was    normal. The estimated ejection fraction was in the range of 60%    to 65%. Wall motion was normal; there were no regional wall    motion abnormalities. Features are consistent with a pseudonormal    left ventricular filling pattern, with concomitant abnormal    relaxation and increased filling pressure (grade 2 diastolic    dysfunction).  - Right ventricle: The cavity size was mildly dilated. Wall    thickness was normal.   US Carotid 06/21/15: Summary:  No significant extracranial carotid artery stenosis demonstrated  1-39%. Vertebrals are patent with antegrade flow.    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   Depression    Diabetes mellitus    History  of pulmonary embolus (PE)    Hypertension    Migraines    Near syncope    Obesity    PCOS (polycystic ovarian syndrome)    Peripheral edema    Sleep apnea    uses cpap set on 10   Stroke ([place]) 12/2011   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     Past Surgical History:  Procedure Laterality Date   ABDOMINAL SURGERY     hernia  repair   CHOLECYSTECTOMY  [id number]   COLONOSCOPY WITH PROPOFOL N/A 07/31/2016   Procedure: COLONOSCOPY WITH PROPOFOL;  Surgeon: Arta Silence, MD;  Location: [facility];  Service: Endoscopy;  Laterality: N/A;   COLONOSCOPY WITH PROPOFOL N/A 03/08/2020   Procedure: COLONOSCOPY WITH PROPOFOL;  Surgeon: Arta Silence, MD;  Location: [facility];  Service: Endoscopy;  Laterality: N/A;   FOOT SURGERY Right 06/2007   IR RADIOLOGIST EVAL & MGMT  12/14/2019   KNEE ARTHROSCOPY  09/2008   left   KNEE ARTHROSCOPY  april 2011   left   NASAL SINUS SURGERY     NASAL SINUS SURGERY  1990   POLYPECTOMY  03/08/2020   Procedure: POLYPECTOMY;  Surgeon: Arta Silence, MD;  Location: [facility];  Service: Endoscopy;;   SHOULDER SURGERY  2004   left    MEDICATIONS: No current facility-administered medications for this encounter.    acetaminophen (TYLENOL) 500 MG tablet   albuterol (ACCUNEB) 1.25 MG/3ML nebulizer solution   albuterol (VENTOLIN HFA) 108 (90 Base) MCG/ACT inhaler   apixaban (ELIQUIS) 5 MG TABS tablet   aspirin EC 81 MG tablet   atorvastatin (LIPITOR) 40 MG tablet   buPROPion (WELLBUTRIN XL) 300 MG 24 hr tablet   carisoprodol (SOMA) 350 MG tablet   Cholecalciferol (VITAMIN D3 PO)   docusate sodium (COLACE) 100 MG capsule   Fluticasone-Salmeterol (ADVAIR) 500-50 MCG/DOSE AEPB   gabapentin (NEURONTIN) 100 MG capsule   insulin aspart (NOVOLOG FLEXPEN) 100 UNIT/ML FlexPen   insulin glargine (LANTUS) 100 UNIT/ML injection   lisinopril (ZESTRIL) 20 MG tablet   montelukast (SINGULAIR) 10 MG tablet   oxyCODONE-acetaminophen (PERCOCET) 10-325 MG tablet   polyethylene glycol (MIRALAX / GLYCOLAX) 17 g packet   promethazine (PHENERGAN) 25 MG tablet    Myra Gianotti, PA-C Surgical Short Stay/Anesthesiology [facility] Phone [phone] [facility] Phone [phone] 10/11/2022 1:39 PM

## 2022-10-11 NOTE — Progress Notes (Signed)
Patient was called and informed that the surgery time for Tuesday was changed to 13:06 o'clock. Patient was instructed to be at the hospital at 10:30 o'clock and stop clear liquids at 10:00 o'clock. Patient verbalized understanding.

## 2022-10-11 NOTE — Anesthesia Preprocedure Evaluation (Signed)
Anesthesia Evaluation  Patient identified by MRN, date of birth, ID band Patient awake    Reviewed: Allergy & Precautions, NPO status , Patient's Chart, lab work & pertinent test results  History of Anesthesia Complications Negative for: history of anesthetic complications  Airway Mallampati: III  TM Distance: >3 FB Neck ROM: Full    Dental  (+) Dental Advisory Given, Missing   Pulmonary asthma , sleep apnea , PE    + wheezing      Cardiovascular hypertension, Pt. on medications Normal cardiovascular exam     Neuro/Psych  Headaches PSYCHIATRIC DISORDERS Anxiety Depression     Blind right eye  CVA, Residual Symptoms    GI/Hepatic negative GI ROS, Neg liver ROS,,,  Endo/Other  diabetes, Type 2, Insulin Dependent  Morbid obesity  Renal/GU CRFRenal disease Bladder dysfunction      Musculoskeletal  (+) Arthritis ,    Abdominal  (+) + obese  Peds  Hematology  On eliquis    Anesthesia Other Findings Chronic pain   Reproductive/Obstetrics  PCOS                              Anesthesia Physical Anesthesia Plan  ASA: 3  Anesthesia Plan: General   Post-op Pain Management: Regional block* and Tylenol PO (pre-op)*   Induction: Intravenous  PONV Risk Score and Plan: 3 and Treatment may vary due to age or medical condition, Ondansetron, Dexamethasone and Midazolam  Airway Management Planned: Oral ETT and Video Laryngoscope Planned  Additional Equipment: None  Intra-op Plan:   Post-operative Plan: Extubation in OR  Informed Consent: I have reviewed the patients History and Physical, chart, labs and discussed the procedure including the risks, benefits and alternatives for the proposed anesthesia with the patient or authorized representative who has indicated his/her understanding and acceptance.     Dental advisory given  Plan Discussed with: CRNA, Anesthesiologist and  Surgeon  Anesthesia Plan Comments: ( )       Anesthesia Quick Evaluation

## 2022-10-15 ENCOUNTER — Encounter (HOSPITAL_COMMUNITY)
Admission: RE | Disposition: A | Discharge status: Home / Self Care | Payer: Self-pay | Source: Home / Self Care | Attending: Orthopedic Surgery

## 2022-10-15 ENCOUNTER — Ambulatory Visit (HOSPITAL_COMMUNITY): Payer: Medicare HMO

## 2022-10-15 ENCOUNTER — Ambulatory Visit (HOSPITAL_BASED_OUTPATIENT_CLINIC_OR_DEPARTMENT_OTHER): Payer: Medicare HMO | Admitting: Vascular Surgery

## 2022-10-15 ENCOUNTER — Other Ambulatory Visit: Payer: Self-pay

## 2022-10-15 ENCOUNTER — Ambulatory Visit (HOSPITAL_COMMUNITY): Payer: Medicare HMO | Admitting: Vascular Surgery

## 2022-10-15 ENCOUNTER — Encounter (HOSPITAL_COMMUNITY): Payer: Self-pay | Admitting: Orthopedic Surgery

## 2022-10-15 ENCOUNTER — Ambulatory Visit (HOSPITAL_COMMUNITY)
Admission: RE | Admit: 2022-10-15 | Discharge: 2022-10-15 | Disposition: A | Discharge status: Home / Self Care | Payer: Medicare HMO | Attending: Orthopedic Surgery | Admitting: Orthopedic Surgery

## 2022-10-15 DIAGNOSIS — S52031A Displaced fracture of olecranon process with intraarticular extension of right ulna, initial encounter for closed fracture: Secondary | ICD-10-CM | POA: Diagnosis not present

## 2022-10-15 DIAGNOSIS — Z7901 Long term (current) use of anticoagulants: Secondary | ICD-10-CM | POA: Insufficient documentation

## 2022-10-15 DIAGNOSIS — Z992 Dependence on renal dialysis: Secondary | ICD-10-CM | POA: Diagnosis not present

## 2022-10-15 DIAGNOSIS — E1122 Type 2 diabetes mellitus with diabetic chronic kidney disease: Secondary | ICD-10-CM | POA: Insufficient documentation

## 2022-10-15 DIAGNOSIS — Z8673 Personal history of transient ischemic attack (TIA), and cerebral infarction without residual deficits: Secondary | ICD-10-CM | POA: Diagnosis not present

## 2022-10-15 DIAGNOSIS — Z86711 Personal history of pulmonary embolism: Secondary | ICD-10-CM | POA: Diagnosis not present

## 2022-10-15 DIAGNOSIS — S52021A Displaced fracture of olecranon process without intraarticular extension of right ulna, initial encounter for closed fracture: Secondary | ICD-10-CM | POA: Diagnosis not present

## 2022-10-15 DIAGNOSIS — W19XXXA Unspecified fall, initial encounter: Secondary | ICD-10-CM | POA: Insufficient documentation

## 2022-10-15 DIAGNOSIS — I129 Hypertensive chronic kidney disease with stage 1 through stage 4 chronic kidney disease, or unspecified chronic kidney disease: Secondary | ICD-10-CM | POA: Insufficient documentation

## 2022-10-15 DIAGNOSIS — G4730 Sleep apnea, unspecified: Secondary | ICD-10-CM | POA: Diagnosis not present

## 2022-10-15 DIAGNOSIS — N189 Chronic kidney disease, unspecified: Secondary | ICD-10-CM | POA: Diagnosis not present

## 2022-10-15 DIAGNOSIS — M1990 Unspecified osteoarthritis, unspecified site: Secondary | ICD-10-CM | POA: Diagnosis not present

## 2022-10-15 DIAGNOSIS — J45909 Unspecified asthma, uncomplicated: Secondary | ICD-10-CM | POA: Insufficient documentation

## 2022-10-15 DIAGNOSIS — E282 Polycystic ovarian syndrome: Secondary | ICD-10-CM | POA: Diagnosis not present

## 2022-10-15 DIAGNOSIS — Z794 Long term (current) use of insulin: Secondary | ICD-10-CM | POA: Diagnosis not present

## 2022-10-15 DIAGNOSIS — G8918 Other acute postprocedural pain: Secondary | ICD-10-CM | POA: Diagnosis not present

## 2022-10-15 DIAGNOSIS — F418 Other specified anxiety disorders: Secondary | ICD-10-CM | POA: Insufficient documentation

## 2022-10-15 DIAGNOSIS — Z6841 Body Mass Index (BMI) 40.0 and over, adult: Secondary | ICD-10-CM | POA: Insufficient documentation

## 2022-10-15 HISTORY — PX: ORIF ELBOW FRACTURE: SHX5031

## 2022-10-15 HISTORY — DX: Chronic kidney disease, unspecified: N18.9

## 2022-10-15 LAB — CBC
HCT: 39.2 % (ref 36.0–46.0)
Hemoglobin: 12.5 g/dL (ref 12.0–15.0)
MCH: 30 pg (ref 26.0–34.0)
MCHC: 31.9 g/dL (ref 30.0–36.0)
MCV: 94 fL (ref 80.0–100.0)
Platelets: 206 10*3/uL (ref 150–400)
RBC: 4.17 MIL/uL (ref 3.87–5.11)
RDW: 13.2 % (ref 11.5–15.5)
WBC: 7.3 10*3/uL (ref 4.0–10.5)
nRBC: 0 % (ref 0.0–0.2)

## 2022-10-15 LAB — GLUCOSE, CAPILLARY
Glucose-Capillary: 223 mg/dL — ABNORMAL HIGH (ref 70–99)
Glucose-Capillary: 217 mg/dL — ABNORMAL HIGH (ref 70–99)

## 2022-10-15 LAB — BASIC METABOLIC PANEL
Anion gap: 10 (ref 5–15)
BUN: 18 mg/dL (ref 6–20)
CO2: 24 mmol/L (ref 22–32)
Calcium: 8.6 mg/dL — ABNORMAL LOW (ref 8.9–10.3)
Chloride: 104 mmol/L (ref 98–111)
Creatinine, Ser: 1.24 mg/dL — ABNORMAL HIGH (ref 0.44–1.00)
GFR, Estimated: 51 mL/min — ABNORMAL LOW (ref >60)
Glucose, Bld: 236 mg/dL — ABNORMAL HIGH (ref 70–99)
Potassium: 4.8 mmol/L (ref 3.5–5.1)
Sodium: 138 mmol/L (ref 135–145)

## 2022-10-15 SURGERY — OPEN REDUCTION INTERNAL FIXATION (ORIF) ELBOW/OLECRANON FRACTURE
Anesthesia: General | Site: Elbow | Laterality: Right

## 2022-10-15 MED ORDER — ROCURONIUM BROMIDE 100 MG/10ML IV SOLN
INTRAVENOUS | Status: DC | PRN
  Administered 2022-10-15: 70 mg via INTRAVENOUS

## 2022-10-15 MED ORDER — PROPOFOL 10 MG/ML IV BOLUS
INTRAVENOUS | Status: AC
  Filled 2022-10-15: qty 20

## 2022-10-15 MED ORDER — ALBUTEROL SULFATE HFA 108 (90 BASE) MCG/ACT IN AERS
INHALATION_SPRAY | RESPIRATORY_TRACT | Status: DC | PRN
  Administered 2022-10-15: 4 via RESPIRATORY_TRACT

## 2022-10-15 MED ORDER — BUPIVACAINE LIPOSOME 1.3 % IJ SUSP
INTRAMUSCULAR | Status: DC | PRN
  Administered 2022-10-15: 10 mL via PERINEURAL

## 2022-10-15 MED ORDER — LIDOCAINE 2% (20 MG/ML) 5 ML SYRINGE
INTRAMUSCULAR | Status: AC
  Filled 2022-10-15: qty 5

## 2022-10-15 MED ORDER — PROPOFOL 10 MG/ML IV BOLUS
INTRAVENOUS | Status: DC | PRN
  Administered 2022-10-15: 200 mg via INTRAVENOUS

## 2022-10-15 MED ORDER — VANCOMYCIN HCL 1000 MG IV SOLR
INTRAVENOUS | Status: DC | PRN
  Administered 2022-10-15: 1000 mg

## 2022-10-15 MED ORDER — FENTANYL CITRATE (PF) 250 MCG/5ML IJ SOLN
INTRAMUSCULAR | Status: AC
  Filled 2022-10-15: qty 5

## 2022-10-15 MED ORDER — DEXAMETHASONE SODIUM PHOSPHATE 10 MG/ML IJ SOLN
INTRAMUSCULAR | Status: DC | PRN
  Administered 2022-10-15: 10 mg via INTRAVENOUS

## 2022-10-15 MED ORDER — FENTANYL CITRATE (PF) 100 MCG/2ML IJ SOLN: 50.0000 ug | Freq: Once | INTRAMUSCULAR | Status: AC

## 2022-10-15 MED ORDER — 0.9 % SODIUM CHLORIDE (POUR BTL) OPTIME
TOPICAL | Status: DC | PRN
  Administered 2022-10-15: 1000 mL

## 2022-10-15 MED ORDER — CEFAZOLIN IN SODIUM CHLORIDE 3-0.9 GM/100ML-% IV SOLN
3.0000 g | INTRAVENOUS | Status: AC
  Administered 2022-10-15: 3 g via INTRAVENOUS
  Filled 2022-10-15: qty 100

## 2022-10-15 MED ORDER — PROMETHAZINE HCL 25 MG/ML IJ SOLN: 6.2500 mg | INTRAMUSCULAR | Status: DC | PRN

## 2022-10-15 MED ORDER — OXYCODONE HCL 5 MG PO TABS: 5.0000 mg | ORAL_TABLET | Freq: Once | ORAL | Status: DC | PRN

## 2022-10-15 MED ORDER — ONDANSETRON HCL 4 MG/2ML IJ SOLN
INTRAMUSCULAR | Status: DC | PRN
  Administered 2022-10-15: 4 mg via INTRAVENOUS

## 2022-10-15 MED ORDER — LACTATED RINGERS IV SOLN: INTRAVENOUS | Status: DC

## 2022-10-15 MED ORDER — DIPHENHYDRAMINE HCL 50 MG/ML IJ SOLN
INTRAMUSCULAR | Status: AC
  Filled 2022-10-15: qty 1

## 2022-10-15 MED ORDER — FENTANYL CITRATE (PF) 100 MCG/2ML IJ SOLN
INTRAMUSCULAR | Status: DC | PRN
  Administered 2022-10-15 (×3): 50 ug via INTRAVENOUS

## 2022-10-15 MED ORDER — FENTANYL CITRATE (PF) 100 MCG/2ML IJ SOLN
INTRAMUSCULAR | Status: AC
  Administered 2022-10-15: 50 ug via INTRAVENOUS
  Filled 2022-10-15: qty 2

## 2022-10-15 MED ORDER — OXYCODONE HCL 5 MG/5ML PO SOLN: 5.0000 mg | Freq: Once | ORAL | Status: DC | PRN

## 2022-10-15 MED ORDER — SUGAMMADEX SODIUM 500 MG/5ML IV SOLN
INTRAVENOUS | Status: AC
  Filled 2022-10-15: qty 5

## 2022-10-15 MED ORDER — SCOPOLAMINE 1 MG/3DAYS TD PT72
1.0000 | MEDICATED_PATCH | TRANSDERMAL | Status: DC
  Administered 2022-10-15: 1.5 mg via TRANSDERMAL
  Filled 2022-10-15: qty 1

## 2022-10-15 MED ORDER — PROMETHAZINE HCL 12.5 MG PO TABS: 12.5000 mg | ORAL_TABLET | Freq: Four times a day (QID) | ORAL | 0 refills | Status: DC | PRN

## 2022-10-15 MED ORDER — MIDAZOLAM HCL 2 MG/2ML IJ SOLN: 1.0000 mg | Freq: Once | INTRAMUSCULAR | Status: AC

## 2022-10-15 MED ORDER — DIPHENHYDRAMINE HCL 50 MG/ML IJ SOLN
INTRAMUSCULAR | Status: DC | PRN
  Administered 2022-10-15: 6.25 mg via INTRAVENOUS

## 2022-10-15 MED ORDER — SUGAMMADEX SODIUM 200 MG/2ML IV SOLN
INTRAVENOUS | Status: DC | PRN
  Administered 2022-10-15: 300 mg via INTRAVENOUS

## 2022-10-15 MED ORDER — CHLORHEXIDINE GLUCONATE 0.12 % MT SOLN
15.0000 mL | Freq: Once | OROMUCOSAL | Status: AC
  Administered 2022-10-15: 15 mL via OROMUCOSAL
  Filled 2022-10-15: qty 15

## 2022-10-15 MED ORDER — INSULIN ASPART 100 UNIT/ML IJ SOLN
0.0000 [IU] | INTRAMUSCULAR | Status: AC | PRN
  Administered 2022-10-15 (×2): 4 [IU] via SUBCUTANEOUS

## 2022-10-15 MED ORDER — DEXAMETHASONE SODIUM PHOSPHATE 10 MG/ML IJ SOLN
INTRAMUSCULAR | Status: AC
  Filled 2022-10-15: qty 1

## 2022-10-15 MED ORDER — ROCURONIUM BROMIDE 10 MG/ML (PF) SYRINGE
PREFILLED_SYRINGE | INTRAVENOUS | Status: AC
  Filled 2022-10-15: qty 10

## 2022-10-15 MED ORDER — SUCCINYLCHOLINE CHLORIDE 200 MG/10ML IV SOSY
PREFILLED_SYRINGE | INTRAVENOUS | Status: DC | PRN
  Administered 2022-10-15: 140 mg via INTRAVENOUS

## 2022-10-15 MED ORDER — ACETAMINOPHEN 500 MG PO TABS
1000.0000 mg | ORAL_TABLET | Freq: Once | ORAL | Status: AC
  Administered 2022-10-15: 1000 mg via ORAL
  Filled 2022-10-15: qty 2

## 2022-10-15 MED ORDER — VANCOMYCIN HCL 1000 MG IV SOLR
INTRAVENOUS | Status: AC
  Filled 2022-10-15: qty 20

## 2022-10-15 MED ORDER — ONDANSETRON HCL 4 MG/2ML IJ SOLN
INTRAMUSCULAR | Status: AC
  Filled 2022-10-15: qty 2

## 2022-10-15 MED ORDER — LIDOCAINE 2% (20 MG/ML) 5 ML SYRINGE
INTRAMUSCULAR | Status: DC | PRN
  Administered 2022-10-15: 60 mg via INTRAVENOUS

## 2022-10-15 MED ORDER — ORAL CARE MOUTH RINSE: 15.0000 mL | Freq: Once | OROMUCOSAL | Status: AC

## 2022-10-15 MED ORDER — MIDAZOLAM HCL 2 MG/2ML IJ SOLN
INTRAMUSCULAR | Status: AC
  Administered 2022-10-15: 1 mg via INTRAVENOUS
  Filled 2022-10-15: qty 2

## 2022-10-15 MED ORDER — ALBUTEROL SULFATE HFA 108 (90 BASE) MCG/ACT IN AERS
INHALATION_SPRAY | RESPIRATORY_TRACT | Status: AC
  Filled 2022-10-15: qty 6.7

## 2022-10-15 MED ORDER — FENTANYL CITRATE (PF) 100 MCG/2ML IJ SOLN: 25.0000 ug | INTRAMUSCULAR | Status: DC | PRN

## 2022-10-15 MED ORDER — BUPIVACAINE HCL (PF) 0.5 % IJ SOLN
INTRAMUSCULAR | Status: DC | PRN
  Administered 2022-10-15: 15 mL via PERINEURAL

## 2022-10-15 SURGICAL SUPPLY — 95 items
ADH SKN CLS APL DERMABOND .7 (GAUZE/BANDAGES/DRESSINGS) ×1
APL PRP STRL LF DISP 70% ISPRP (MISCELLANEOUS) ×1
APL SKNCLS STERI-STRIP NONHPOA (GAUZE/BANDAGES/DRESSINGS) ×1
BAG COUNTER SPONGE SURGICOUNT (BAG) ×1 IMPLANT
BAG SPNG CNTER NS LX DISP (BAG) ×1
BENZOIN TINCTURE PRP APPL 2/3 (GAUZE/BANDAGES/DRESSINGS) IMPLANT
BIT DRILL SOLID SSC 2.7X40 (DRILL) IMPLANT
BIT DRILL SOLID SSC 2.7X80 (DRILL) IMPLANT
BLADE AVERAGE 25X9 (BLADE) IMPLANT
BLADE CLIPPER SURG (BLADE) ×1 IMPLANT
BLADE SURG 10 STRL SS (BLADE) IMPLANT
BNDG CMPR 5X6 CHSV STRCH STRL (GAUZE/BANDAGES/DRESSINGS) ×1
BNDG CMPR 9X4 STRL LF SNTH (GAUZE/BANDAGES/DRESSINGS)
BNDG COHESIVE 4X5 TAN STRL (GAUZE/BANDAGES/DRESSINGS) ×1 IMPLANT
BNDG COHESIVE 6X5 TAN ST LF (GAUZE/BANDAGES/DRESSINGS) IMPLANT
BNDG ELASTIC 3X5.8 VLCR STR LF (GAUZE/BANDAGES/DRESSINGS) ×1 IMPLANT
BNDG ELASTIC 4X5.8 VLCR STR LF (GAUZE/BANDAGES/DRESSINGS) ×1 IMPLANT
BNDG ELASTIC 6X5.8 VLCR STR LF (GAUZE/BANDAGES/DRESSINGS) ×1 IMPLANT
BNDG ESMARK 4X9 LF (GAUZE/BANDAGES/DRESSINGS) IMPLANT
BNDG GAUZE DERMACEA FLUFF 4 (GAUZE/BANDAGES/DRESSINGS) ×1 IMPLANT
BNDG GZE DERMACEA 4 6PLY (GAUZE/BANDAGES/DRESSINGS) ×1
BRUSH SCRUB EZ PLAIN DRY (MISCELLANEOUS) ×2 IMPLANT
CHLORAPREP W/TINT 26 (MISCELLANEOUS) ×1 IMPLANT
CLEANER TIP ELECTROSURG 2X2 (MISCELLANEOUS) ×1 IMPLANT
CONNECTOR 5 IN 1 STRAIGHT STRL (MISCELLANEOUS) IMPLANT
COVER SURGICAL LIGHT HANDLE (MISCELLANEOUS) ×2 IMPLANT
CUFF TOURN SGL QUICK 18X4 (TOURNIQUET CUFF) IMPLANT
CUFF TOURN SGL QUICK 24 (TOURNIQUET CUFF) ×1
CUFF TRNQT CYL 24X4X16.5-23 (TOURNIQUET CUFF) IMPLANT
CUFF TRNQT CYL 24X4X40X1 (TOURNIQUET CUFF) IMPLANT
DERMABOND ADVANCED .7 DNX12 (GAUZE/BANDAGES/DRESSINGS) IMPLANT
DRAPE C-ARM 42X72 X-RAY (DRAPES) ×1 IMPLANT
DRAPE C-ARMOR (DRAPES) ×1 IMPLANT
DRAPE INCISE IOBAN 66X45 STRL (DRAPES) IMPLANT
DRAPE ORTHO SPLIT 77X108 STRL (DRAPES) ×2
DRAPE SURG ORHT 6 SPLT 77X108 (DRAPES) ×2 IMPLANT
DRAPE U-SHAPE 47X51 STRL (DRAPES) ×1 IMPLANT
DRILL SOLID SSC 2.7X40 (DRILL) ×1
DRILL SOLID SSC 2.7X80 (DRILL) ×1
DRSG ADAPTIC 3X8 NADH LF (GAUZE/BANDAGES/DRESSINGS) IMPLANT
ELECT REM PT RETURN 9FT ADLT (ELECTROSURGICAL) ×1
ELECTRODE REM PT RTRN 9FT ADLT (ELECTROSURGICAL) ×1 IMPLANT
GAUZE PAD ABD 8X10 STRL (GAUZE/BANDAGES/DRESSINGS) IMPLANT
GAUZE SPONGE 4X4 12PLY STRL (GAUZE/BANDAGES/DRESSINGS) ×1 IMPLANT
GAUZE XEROFORM 5X9 LF (GAUZE/BANDAGES/DRESSINGS) ×1 IMPLANT
GLOVE BIO SURGEON STRL SZ 6.5 (GLOVE) ×3 IMPLANT
GLOVE BIO SURGEON STRL SZ7.5 (GLOVE) ×4 IMPLANT
GLOVE BIOGEL PI IND STRL 6.5 (GLOVE) ×1 IMPLANT
GLOVE BIOGEL PI IND STRL 7.5 (GLOVE) ×1 IMPLANT
GLOVE ORTHO TXT STRL SZ7.5 (GLOVE) IMPLANT
GOWN STRL REUS W/ TWL LRG LVL3 (GOWN DISPOSABLE) ×2 IMPLANT
GOWN STRL REUS W/TWL LRG LVL3 (GOWN DISPOSABLE) ×2
K-WIRE STD TIP 2X152 (WIRE) ×2
KIT BASIN OR (CUSTOM PROCEDURE TRAY) ×1 IMPLANT
KIT TURNOVER KIT B (KITS) ×1 IMPLANT
KWIRE STD TIP 2X152 (WIRE) IMPLANT
MANIFOLD NEPTUNE II (INSTRUMENTS) ×1 IMPLANT
NS IRRIG 1000ML POUR BTL (IV SOLUTION) ×1 IMPLANT
PACK ORTHO EXTREMITY (CUSTOM PROCEDURE TRAY) ×1 IMPLANT
PAD ARMBOARD 7.5X6 YLW CONV (MISCELLANEOUS) ×2 IMPLANT
PAD CAST 4YDX4 CTTN HI CHSV (CAST SUPPLIES) ×1 IMPLANT
PADDING CAST COTTON 4X4 STRL (CAST SUPPLIES) ×1
PLATE PROX ULNA 73 RT (Plate) IMPLANT
SCREW COMP MTHRD 3.5X18 (Screw) IMPLANT
SCREW COMP MTHRD 3.5X20 (Screw) IMPLANT
SCREW COMPR MTHRD 3.5X40 (Screw) IMPLANT
SCREW CORT NLOCK PA 3.5X24 (Screw) IMPLANT
SCREW LOCK MTHRD 3.5X18 (Screw) IMPLANT
SCREW LOCK MTHRD 3.5X28 (Screw) IMPLANT
SPIKE FLUID TRANSFER (MISCELLANEOUS) IMPLANT
SPONGE T-LAP 18X18 ~~LOC~~+RFID (SPONGE) ×2 IMPLANT
STAPLER VISISTAT 35W (STAPLE) ×1 IMPLANT
STOCKINETTE IMPERVIOUS 9X36 MD (GAUZE/BANDAGES/DRESSINGS) IMPLANT
STRIP CLOSURE SKIN 1/2X4 (GAUZE/BANDAGES/DRESSINGS) IMPLANT
SUCTION FRAZIER HANDLE 10FR (MISCELLANEOUS) ×1
SUCTION TUBE FRAZIER 10FR DISP (MISCELLANEOUS) ×1 IMPLANT
SUT MNCRL AB 3-0 PS2 18 (SUTURE) ×1 IMPLANT
SUT MON AB 2-0 CT1 27 (SUTURE) IMPLANT
SUT MON AB 2-0 CT1 36 (SUTURE) ×1 IMPLANT
SUT PDS AB 1 CTX 36 (SUTURE) IMPLANT
SUT PDS AB 2-0 CT1 27 (SUTURE) IMPLANT
SUT PROLENE 0 CT (SUTURE) IMPLANT
SUT PROLENE 3 0 PS 2 (SUTURE) ×2 IMPLANT
SUT VIC AB 0 CT1 27 (SUTURE) ×2
SUT VIC AB 0 CT1 27XBRD ANBCTR (SUTURE) ×2 IMPLANT
SUT VIC AB 2-0 CT1 27 (SUTURE) ×2
SUT VIC AB 2-0 CT1 TAPERPNT 27 (SUTURE) ×2 IMPLANT
SUT VIC AB 2-0 CT3 27 (SUTURE) IMPLANT
SYR CONTROL 10ML LL (SYRINGE) ×1 IMPLANT
TOWEL GREEN STERILE (TOWEL DISPOSABLE) ×2 IMPLANT
TOWEL GREEN STERILE FF (TOWEL DISPOSABLE) IMPLANT
TUBE CONNECTING 12X1/4 (SUCTIONS) ×1 IMPLANT
UNDERPAD 30X36 HEAVY ABSORB (UNDERPADS AND DIAPERS) ×1 IMPLANT
WATER STERILE IRR 1000ML POUR (IV SOLUTION) ×1 IMPLANT
YANKAUER SUCT BULB TIP NO VENT (SUCTIONS) ×1 IMPLANT

## 2022-10-15 NOTE — Anesthesia Procedure Notes (Signed)
Anesthesia Regional Block: Supraclavicular block   Pre-Anesthetic Checklist: , timeout performed,  Correct Patient, Correct Site, Correct Laterality,  Correct Procedure, Correct Position, site marked,  Risks and benefits discussed,  Surgical consent,  Pre-op evaluation,  At surgeon's request and post-op pain management  Laterality: Right  Prep: chloraprep       Needles:  Injection technique: Single-shot  Needle Type: Echogenic Needle     Needle Length: 5cm  Needle Gauge: 21     Additional Needles:   Narrative:  Start time: 10/15/2022 11:44 AM End time: 10/15/2022 11:49 AM Injection made incrementally with aspirations every 5 mL.  Performed by: Personally  Anesthesiologist: Audry Pili, MD  Additional Notes: No pain on injection. No increased resistance to injection. Injection made in 5cc increments. Good needle visualization. Patient tolerated the procedure well.

## 2022-10-15 NOTE — Anesthesia Procedure Notes (Signed)
Procedure Name: Intubation Date/Time: 10/15/2022 1:30 PM  Performed by: Gwyndolyn Saxon, CRNAPre-anesthesia Checklist: Patient identified, Emergency Drugs available, Suction available and Patient being monitored Patient Re-evaluated:Patient Re-evaluated prior to induction Oxygen Delivery Method: Circle system utilized Preoxygenation: Pre-oxygenation with 100% oxygen Induction Type: IV induction Ventilation: Mask ventilation without difficulty Laryngoscope Size: Miller and 2 Grade View: Grade I Tube type: Oral Tube size: 7.0 mm Number of attempts: 1 Airway Equipment and Method: Patient positioned with wedge pillow and Stylet Placement Confirmation: ETT inserted through vocal cords under direct vision, positive ETCO2 and breath sounds checked- equal and bilateral Secured at: 22 cm Tube secured with: Tape Dental Injury: Teeth and Oropharynx as per pre-operative assessment

## 2022-10-15 NOTE — H&P (Signed)
PREOPERATIVE H&P  HPI: Kristen [name] is a 56 y.o. female who has presented today for surgery, with the diagnosis of right displaced intra-articular olecranon fracture.  The various methods of treatment have been discussed with the patient and family.  After consideration of risks, benefits, and other options for treatment, the patient has consented to OPEN REDUCTION INTERNAL FIXATION (ORIF) OLECRANON FRACTURE as a surgical intervention.  The patient's history has been reviewed, patient examined, no change in status, stable for surgery.  I have reviewed the patient's chart and labs.  Questions were answered to the patient's satisfaction.    PMH: 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   Depression    Diabetes mellitus    History of pulmonary embolus (PE)    Hypertension    Migraines    Near syncope    Obesity    PCOS (polycystic ovarian syndrome)    Peripheral edema    Sleep apnea    uses cpap set on 10   Stroke ([place]) 12/2011   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     Home Medications Allergies  No current facility-administered medications on file prior to encounter.   Current Outpatient Medications on File Prior to Encounter  Medication Sig Dispense Refill   acetaminophen (TYLENOL) 500 MG tablet Take 1,000 mg by mouth every 6 (six) hours as needed for moderate pain.     albuterol (VENTOLIN HFA) 108 (90 Base) MCG/ACT inhaler Inhale 2 puffs into the lungs every 4 (four) hours as needed for wheezing or shortness of breath.      apixaban (ELIQUIS) 5 MG TABS tablet Take 5 mg by mouth 2 (two) times daily.     aspirin EC 81 MG tablet Take 81 mg by mouth at bedtime.     atorvastatin (LIPITOR) 40 MG tablet Take 40 mg by mouth in the morning.     buPROPion (WELLBUTRIN XL) 300 MG 24 hr tablet Take 300 mg by mouth every morning.     carisoprodol (SOMA) 350 MG tablet Take 1 tablet (350 mg total) by mouth every 8  (eight) hours. 270 tablet 1   Cholecalciferol (VITAMIN D3 PO) Take 1 tablet by mouth in the morning.     docusate sodium (COLACE) 100 MG capsule Take 300-400 mg by mouth at bedtime.     Fluticasone-Salmeterol (ADVAIR) 500-50 MCG/DOSE AEPB Inhale 1 puff into the lungs every 12 (twelve) hours.     gabapentin (NEURONTIN) 100 MG capsule TAKE 2 CAPSULES IN THE MORNING, TAKE 1 CAPSULE IN THE AFTERNOON, AND TAKE 1 CAPSULE AT BEDTIME 360 capsule 1   insulin aspart (NOVOLOG FLEXPEN) 100 UNIT/ML FlexPen Inject 15 Units into the skin 3 (three) times daily with meals. (Patient taking differently: Inject 60 Units into the skin 3 (three) times daily with meals.) 15 mL 11   insulin glargine (LANTUS) 100 UNIT/ML injection Inject 0.6 mLs (60 Units total) into the skin at bedtime. (Patient taking differently: Inject 80 Units into the skin at bedtime.) 10 mL 11   lisinopril (ZESTRIL) 20 MG tablet Take 20 mg by mouth at bedtime.     montelukast (SINGULAIR) 10 MG tablet Take 10 mg by mouth at bedtime.     oxyCODONE-acetaminophen (PERCOCET) 10-325 MG tablet Take 1 tablet by mouth every 6 (six) hours as needed for pain. Do Not Fill Before 10/09/2022 120 tablet 0  polyethylene glycol (MIRALAX / GLYCOLAX) 17 g packet Take 17 g by mouth daily as needed (constipation.).     promethazine (PHENERGAN) 25 MG tablet Take 25 mg by mouth every 6 (six) hours as needed for nausea or vomiting.      albuterol (ACCUNEB) 1.25 MG/3ML nebulizer solution Inhale 1 ampule into the lungs 3 (three) times daily as needed for wheezing or shortness of breath.      Allergies  Allergen Reactions   Cafergot Other (See Comments)    Chest pain; ergotamine-caffiene   Glucophage [Metformin Hcl] Anaphylaxis   Metformin Anaphylaxis   Canagliflozin Swelling and Other (See Comments)    Legs Swell invokana Legs Swell   Ergotamine-Caffeine Other (See Comments)    Chest pain   Ergotamine      PSH: Past Surgical History:  Procedure Laterality Date    ABDOMINAL SURGERY     hernia repair   CHOLECYSTECTOMY  [id number]   COLONOSCOPY WITH PROPOFOL N/A 07/31/2016   Procedure: COLONOSCOPY WITH PROPOFOL;  Surgeon: Arta Silence, MD;  [facility]: [facility];  Service: Endoscopy;  Laterality: N/A;   COLONOSCOPY WITH PROPOFOL N/A 03/08/2020   Procedure: COLONOSCOPY WITH PROPOFOL;  Surgeon: Arta Silence, MD;  [facility];  Service: Endoscopy;  Laterality: N/A;   FOOT SURGERY Right 06/2007   IR RADIOLOGIST EVAL & MGMT  12/14/2019   KNEE ARTHROSCOPY  09/2008   left   KNEE ARTHROSCOPY  april 2011   left   NASAL SINUS SURGERY     NASAL SINUS SURGERY  1990   POLYPECTOMY  03/08/2020   Procedure: POLYPECTOMY;  Surgeon: Arta Silence, MD;  [facility];  Service: Endoscopy;;   SHOULDER SURGERY  2004   left     Family History Social History  Family History  Problem Relation Age of Onset   Hypertension Maternal Grandmother    Stroke Maternal Grandmother    Diabetes Maternal Grandfather    Cancer Mother        lung   Cancer Father    Diabetes Sister    Breast cancer Cousin    Heart attack Neg Hx     Social History   Socioeconomic History   Marital status: Single    Spouse name: Not on file   Number of children: Not on file   Years of education: Not on file   Highest education level: Not on file  Occupational History   Not on file  Tobacco Use   Smoking status: Never   Smokeless tobacco: Never  Vaping Use   Vaping Use: Never used  Substance and Sexual Activity   Alcohol use: No    Alcohol/week: 0.0 standard drinks of alcohol   Drug use: No   Sexual activity: Not Currently    Comment: 1st intercourse 22 yo-1 partner  Other Topics Concern   Not on file  Social History Narrative   Not on file   Social Determinants of Health   Financial Resource Strain: Not on file  Food Insecurity: Not on file  Transportation Needs: Not on file  Physical Activity: Not on file  Stress: Not on file  Social Connections:  Not on file     Review of Systems: MSK: As noted per HPI above GI: No current Nausea/vomiting ENT: Denies sore throat, epistaxis CV: Denies chest pain Resp: No current shortness of breath  Other than mentioned above, there are no Constitutional, Neurological, Psychiatric, ENT, Ophthalmological, Cardiovascular, Respiratory, GI, GU, Musculoskeletal, Integumentary, Lymphatic, Endocrine or Allergic issues.   Physical  Examination: CV: Normal distal pulses Lungs: Unlabored respirations RUE: Intact EDC, FDP, APB, and DIO.  SILT M/U/R.  2+ radial pulse.  WWP distally.  Assessment/Plan: OPEN REDUCTION INTERNAL FIXATION (ORIF) OLECRANON FRACTURE    Georgeanna Harrison M.D. Orthopaedic Surgery [facility]  Review of this patient's medications prescribed by other providers does not in any way constitute an endorsement by this clinician of their use, indications, dosage, route, efficacy, interactions, or other clinical parameters.  Portions of the record have been created with voice recognition software.  Grammatical and punctuation errors, random word insertions, wrong-word or "sound-a-like" substitutions, pronoun errors (inaccuracies and/or substitutions), and/or incomplete sentences may have occurred due to the inherent limitations of voice recognition software.  Not all errors are caught or corrected.  Although every attempt is made to root out erroneous and incomplete transcription, the note may still not fully represent the intent or opinion of the author.  Read the chart carefully and recognize, using context, where errors/substitutions have occurred.  Any questions or concerns about the content of this note or information contained within the body of this dictation should be addressed directly with the author for clarification.

## 2022-10-15 NOTE — Progress Notes (Signed)
Cbg 209 @ 1619

## 2022-10-15 NOTE — Op Note (Signed)
OPERATIVE NOTE  Kristen [name] female 56 y.o. 10/15/2022  PREOPERATIVE DIAGNOSIS: Right intra-articular displaced olecranon fracture  POSTOPERATIVE DIAGNOSIS: Right intra-articular displaced olecranon fracture (S52.031A)  PROCEDURE(S): Open treatment right olecranon fracture with internal fixation (95638) Operative use of fluoroscopy for above procedure(s) (75643)  Application long arm splint right upper extremity (32951)  SURGEON: Georgeanna Harrison, M.D.  ASSISTANT(S): None  ANESTHESIA: General  FINDINGS: Preoperative Examination: RUE: Intact EDC, FDP, APB, and DIO.  SILT M/U/R.  2+ radial pulse.  WWP distally.   Operative Findings: Displaced intra-articular fracture of Kristen olecranon process of Kristen right elbow with 2 main fragments.  Anatomic reduction with restoration of Kristen joint surface, confirmed on orthogonal fluoroscopic imaging.  Stable maintenance of reduction with placement of internal fixation with proximal olecranon locking plate.  IMPLANTS: Implant Name Type Inv. Item Serial No. Manufacturer Lot No. LRB No. Used Action  SCREW CORT NLOCK PA 3.5X24 - [id number] Screw SCREW CORT NLOCK PA 3.5X24  SKELETAL [employer]YNAM[employer]  Right 1 Implanted  SCREW COMP MTHRD 3.5X18 - [id number] Screw SCREW COMP MTHRD 3.5X18  SKELETAL DYNAMICS  Right 1 Implanted  SCREW COMP MTHRD 3.5X20 - [id number] Screw SCREW COMP MTHRD 3.5X20  SKELETAL DYNAMICS  Right 1 Implanted  Screw multi-thread compression    SKELETAL DYNAMICS  Right 1 Implanted  Proximal Ulna Plate Right    SKELETAL DYNAMICS  Right 1 Implanted  Screw Multi-Thread Locking    SKELETAL DYNAMICS  Right 2 Implanted  Screw Multi-Thread Locking    SKELETAL DYNAMICS  Right 2 Implanted    INDICATIONS:  Kristen patient is a 56 y.o. female who sustained a mechanical fall onto Kristen right upper extremity and was found to have a displaced intra-articular right olecranon fracture.  She understood Kristen risks, benefits and alternatives to [name] which  include but are not limited to bleeding, wound healing complications, infection, damage to surrounding structures, persistent pain, stiffness, lack of improvement, potential for subsequent arthritis or worsening of pre-existing arthritis, nonunion, malunion, and need for further [name], as well as complications related to anesthesia, cardiovascular complications, and death.  She also understood Kristen potential for continued pain, and that there were no guarantees of acceptable outcome.  After weighing these risks Kristen patient opted to proceed with [name].  TECHNIQUE: Patient was identified in Kristen preoperative holding area.  Kristen right elbow was confirmed as Kristen appropriate operative site and marked by me.  Consent was signed by myself and Kristen patient and witnessed by Kristen preoperative nurse.  Supraclavicular block was performed by anesthesia in Kristen preoperative holding area.  Patient was taken to Kristen operative suite and placed supine on Kristen operative table.  Anesthesia was induced by Kristen anesthesia team.  Kristen patient was positioned appropriately for Kristen procedure and all bony prominences were well padded.  A non-sterile arm tourniquet was placed on Kristen operative extremity.  Preoperative antibiotics were given. Kristen extremity was prepped and draped in Kristen usual sterile fashion and surgical timeout was performed.  Surface anatomy was identified with fluoroscopic assistance.  A longitudinal incision centered on Kristen olecranon was marked on Kristen posterior aspect of Kristen arm.  Limb was exsanguinated with an Esmarch bandage and tourniquet flighted to 300 mmHg.  Skin was incised sharply.  Underlying subcutaneous tissue and fat was dissected with Bovie electrocautery down to Kristen fascial layer.  Soft tissue and muscle was elevated off of Kristen radial and ulnar borders of Kristen ulnar shaft extending distally from Kristen fracture site.  Soft tissue elevation was also performed  off of Kristen medial lateral aspects of Kristen proximal fragment,  allowing visualization of Kristen joint surface medially and laterally.  Kristen fracture was distracted and thoroughly debrided of all clot and debris.  Overhanging soft tissue margins and periosteum was debrided away from Kristen fracture site.  Kristen fracture was then reduced anatomically and provisionally fixed with 2 crossing K wires.  Reduction was confirmed on orthogonal AP and lateral fluoroscopic views.  A short proximal olecranon locking plate was selected which gave excellent fit and spanning of Kristen fracture site.  Kristen triceps was split centrally to accommodate Kristen plate, with minimal elevation medial and laterally to allow Kristen plate to sit on Kristen olecranon process.  Kristen plate was fixed to Kristen distal fragment first with a screw in Kristen oblong hole.  Next Kristen homerun screw was applied to allow compression across Kristen fracture site.  Subsequently Kristen remaining proximal holes were filled with locking screws.  2 additional cortical shaft screws were placed distally through Kristen plate.  Provisional fixation was removed and Kristen elbow was assessed both visually and fluoroscopically.  Inspection of Kristen joint surface demonstrated anatomic reduction.  Fluoroscopic assessment corroborated anatomic reduction of Kristen joint surface and fracture, with full extension, and flexion to 90 degrees without gapping at Kristen fracture site.  Appropriate placement of internal fixation with anatomic reduction was confirmed on orthogonal AP and lateral fluoroscopic views.  Kristen wound was copiously irrigated and hemostasis was obtained.  1 g vancomycin powder was placed deep in Kristen wound.  Kristen triceps was closed with a running whipstitch up and down Kristen split with #2 FiberWire, and Kristen tails were secured through Kristen plate.  Kristen elevated fascial split along Kristen posterior border of Kristen olecranon was closed with interrupted figure-of-eight stitches with #1 PDS.  Kristen fat was closed with multiple simple inverted interrupted #0 Vicryl stitches.  Deep dermal  layer was closed with simple inverted erupted #2 Monocryl followed by running #3 Monocryl subcuticular.  Skin was sealed with Dermabond.  Suture tails were secured with Steri-Strips.  A dry sterile dressing consisting of 4 x 4's, ABD, and sterile Webril was placed over Kristen wound.  With Kristen elbow in 30 degrees, a well-padded posterior long-arm plaster splint was applied and molded and conform to Kristen patient's anatomy.  Patient was then awakened from anesthesia and transferred to PACU in stable condition.  She tolerated procedure well.  No complications were noted intraoperatively.  POSTOPERATIVE INSTRUCTIONS: Mobility: Maintain sling and 30 degrees flexion on Kristen right upper extremity; elevate higher than Kristen heart is much as possible to help control swelling Pain control: Continue to wean/titrate to appropriate oral regimen DVT Prophylaxis: Continue preoperative aspirin RUE: Nonweightbearing Dressing care: Keep splint clean, dry, and intact.  No dressing changes are required.  This will be removed in Kristen office.  If Kristen splint feels too tight, you may unwrap Kristen Ace wrap, gently pull on Kristen plaster/cotton, and rewrap Kristen Ace a little bit looser.  It does need to be tight enough to keep Kristen plaster splint on and in Kristen right place, but sometimes postoperative swelling can make this tighter than it was in Kristen operating room. Disposition: Home Follow-up: Please call [facility] [phone]) to schedule follow-op appointment for 2 weeks after [name].  TOURNIQUET TIME:  Total Tourniquet Time Documented: Upper Arm (Right) - 103 minutes Total: Upper Arm (Right) - 103 minutes   BLOOD LOSS: 5 mL         DRAINS:  None         SPECIMEN: None       COMPLICATIONS:  * No complications entered in OR log *         DISPOSITION: PACU - hemodynamically stable.         CONDITION: stable   Georgeanna Harrison M.D. Orthopaedic [name]   Portions of Kristen record have been created with voice recognition software.  Grammatical and punctuation errors, random word insertions, wrong-word or "sound-a-like" substitutions, pronoun errors (inaccuracies and/or substitutions), and/or incomplete sentences may have occurred due to Kristen inherent limitations of voice recognition software.  Not all errors are caught or corrected.  Although every attempt is made to root out erroneous and incomplete transcription, Kristen note may still not fully represent Kristen intent or opinion of Kristen author.  Read Kristen chart carefully and recognize, using context, where errors/substitutions have occurred.  Any questions or concerns about Kristen content of this note or information contained within Kristen body of this dictation should be addressed directly with Kristen author for clarification.

## 2022-10-15 NOTE — Transfer of Care (Signed)
Immediate Anesthesia Transfer of Care Note  Patient: Kristen [name]  Procedure(s) Performed: OPEN REDUCTION INTERNAL FIXATION (ORIF) OLECRANON FRACTURE (Right: Elbow)  Patient Location: PACU  Anesthesia Type:General  Level of Consciousness: drowsy  Airway & Oxygen Therapy: Patient Spontanous Breathing and Patient connected to nasal cannula oxygen  Post-op Assessment: Report given to RN and Post -op Vital signs reviewed and stable  Post vital signs: Reviewed and stable  Last Vitals:  Vitals Value Taken Time  BP 125/64 10/15/22 1615  Temp 36.6 C 10/15/22 1615  Pulse 82 10/15/22 1616  Resp 18 10/15/22 1616  SpO2 91 % 10/15/22 1616  Vitals shown include unvalidated device data.  Last Pain:  Vitals:   10/15/22 1053  TempSrc:   PainSc: 8          Complications: No notable events documented.

## 2022-10-15 NOTE — Discharge Instructions (Signed)
Discharge instructions for Dr. Georgeanna Harrison, M.D.: Please refer to the two-sided discharge instructions paper that Dr. Mable Fill placed in the patient's paper chart. Please give this to the patient to take home after reviewing with the patient!!   General discharge instructions:  PLEASE REFER TO TWO-SIDED PAPER INSTRUCTIONS IN [place]!!!  Diet: As you were doing prior to hospitalization. Shower:  Unless otherwise specified (i.e. on two-sided paper instructions with paper chart) may shower but keep the wounds dry, use an occlusive plastic wrap, NO SOAKING IN TUB.  If the bandage gets wet, change with a clean dry gauze. Dressing:  Unless otherwise specified (i.e. on two-sided paper instructions with paper chart), may change your dressing 3-5 days after surgery.  Then change the dressing daily with sterile gauze dressing.  If there are sticky tapes (steri-strips) on your wounds and all the stitches are absorbable.  Leave the steri-strips in place when changing your dressings, they will peel off with time, usually 2-3 weeks. Activity:  Increase activity slowly as tolerated, but follow the restrictions on the two-sided paper discharge instructions sheet that Dr. Mable Fill placed in the paper chart.  No lifting or driving for 6 weeks. Weight Bearing: NON-WEIGHTBEARING (NWB) on the RIGHT UPPER EXTREMITY. To prevent constipation: You may use over-the-counter stool softener(s) such as Colace (over the counter) 100 mg by mouth twice a day and/or Miralax (over the counter) for constipation as needed.  Drink plenty of fluids (prune juice may be helpful) and high fiber foods.  Itching:  If you experience itching with your medications, try taking only a single pain pill, or even half a pain pill at a time.  You can also use benadryl over the counter for itching or also to help with sleep.  Precautions:  If you experience chest pain or shortness of breath - call 911 immediately for transfer to  the hospital emergency department!!  PLEASE REFER TO TWO-SIDED PAPER INSTRUCTIONS IN PAPER CHART FOR SPECIFIC INSTRUCTIONS!!!  If you develop a fever greater that 101.1 deg F, purulent drainage from wound, increased redness or drainage from wound, or calf pain -- Call the office at [phone].

## 2022-10-16 NOTE — Anesthesia Postprocedure Evaluation (Signed)
Anesthesia Post Note  Patient: Kristen [name]  Procedure(s) Performed: OPEN REDUCTION INTERNAL FIXATION (ORIF) OLECRANON FRACTURE (Right: Elbow)     Patient location during evaluation: PACU Anesthesia Type: General Level of consciousness: sedated and patient cooperative Pain management: pain level controlled Vital Signs Assessment: post-procedure vital signs reviewed and stable Respiratory status: spontaneous breathing Cardiovascular status: stable Anesthetic complications: no   No notable events documented.  Last Vitals:  Vitals:   10/15/22 1700 10/15/22 1715  BP: 91/73 124/67  Pulse: 73 72  Resp: 20 19  Temp:  36.6 C  SpO2: 92% 90%    Last Pain:  Vitals:   10/15/22 1715  TempSrc:   PainSc: 0-No pain                 Nolon Nations

## 2022-10-20 ENCOUNTER — Encounter (HOSPITAL_COMMUNITY): Payer: Self-pay | Admitting: Orthopedic Surgery

## 2022-10-23 DIAGNOSIS — J45909 Unspecified asthma, uncomplicated: Secondary | ICD-10-CM | POA: Diagnosis not present

## 2022-10-25 DIAGNOSIS — S52031A Displaced fracture of olecranon process with intraarticular extension of right ulna, initial encounter for closed fracture: Secondary | ICD-10-CM | POA: Diagnosis not present

## 2022-10-28 DIAGNOSIS — Z435 Encounter for attention to cystostomy: Secondary | ICD-10-CM | POA: Diagnosis not present

## 2022-10-28 DIAGNOSIS — R800 Isolated proteinuria: Secondary | ICD-10-CM | POA: Diagnosis not present

## 2022-10-28 DIAGNOSIS — R82998 Other abnormal findings in urine: Secondary | ICD-10-CM | POA: Diagnosis not present

## 2022-10-28 DIAGNOSIS — R310 Gross hematuria: Secondary | ICD-10-CM | POA: Diagnosis not present

## 2022-11-07 ENCOUNTER — Encounter: Payer: Medicare HMO | Attending: Registered Nurse | Admitting: Registered Nurse

## 2022-11-07 ENCOUNTER — Encounter: Payer: Self-pay | Admitting: Registered Nurse

## 2022-11-07 VITALS — BP 125/78 | HR 87 | Ht 68.0 in

## 2022-11-07 DIAGNOSIS — I129 Hypertensive chronic kidney disease with stage 1 through stage 4 chronic kidney disease, or unspecified chronic kidney disease: Secondary | ICD-10-CM | POA: Insufficient documentation

## 2022-11-07 DIAGNOSIS — M545 Low back pain, unspecified: Secondary | ICD-10-CM | POA: Diagnosis not present

## 2022-11-07 DIAGNOSIS — Y92009 Unspecified place in unspecified non-institutional (private) residence as the place of occurrence of the external cause: Secondary | ICD-10-CM | POA: Diagnosis not present

## 2022-11-07 DIAGNOSIS — M1712 Unilateral primary osteoarthritis, left knee: Secondary | ICD-10-CM | POA: Diagnosis not present

## 2022-11-07 DIAGNOSIS — Z79891 Long term (current) use of opiate analgesic: Secondary | ICD-10-CM

## 2022-11-07 DIAGNOSIS — W19XXXD Unspecified fall, subsequent encounter: Secondary | ICD-10-CM | POA: Diagnosis not present

## 2022-11-07 DIAGNOSIS — M1711 Unilateral primary osteoarthritis, right knee: Secondary | ICD-10-CM | POA: Diagnosis not present

## 2022-11-07 DIAGNOSIS — M160 Bilateral primary osteoarthritis of hip: Secondary | ICD-10-CM | POA: Insufficient documentation

## 2022-11-07 DIAGNOSIS — M25561 Pain in right knee: Secondary | ICD-10-CM | POA: Insufficient documentation

## 2022-11-07 DIAGNOSIS — M47816 Spondylosis without myelopathy or radiculopathy, lumbar region: Secondary | ICD-10-CM

## 2022-11-07 DIAGNOSIS — M170 Bilateral primary osteoarthritis of knee: Secondary | ICD-10-CM | POA: Insufficient documentation

## 2022-11-07 DIAGNOSIS — M7061 Trochanteric bursitis, right hip: Secondary | ICD-10-CM | POA: Diagnosis not present

## 2022-11-07 DIAGNOSIS — M7918 Myalgia, other site: Secondary | ICD-10-CM

## 2022-11-07 DIAGNOSIS — M4726 Other spondylosis with radiculopathy, lumbar region: Secondary | ICD-10-CM | POA: Diagnosis not present

## 2022-11-07 DIAGNOSIS — F329 Major depressive disorder, single episode, unspecified: Secondary | ICD-10-CM | POA: Diagnosis not present

## 2022-11-07 DIAGNOSIS — M47812 Spondylosis without myelopathy or radiculopathy, cervical region: Secondary | ICD-10-CM | POA: Diagnosis not present

## 2022-11-07 DIAGNOSIS — M25521 Pain in right elbow: Secondary | ICD-10-CM | POA: Insufficient documentation

## 2022-11-07 DIAGNOSIS — N189 Chronic kidney disease, unspecified: Secondary | ICD-10-CM | POA: Diagnosis not present

## 2022-11-07 DIAGNOSIS — Y9301 Activity, walking, marching and hiking: Secondary | ICD-10-CM | POA: Diagnosis not present

## 2022-11-07 DIAGNOSIS — Z6841 Body Mass Index (BMI) 40.0 and over, adult: Secondary | ICD-10-CM | POA: Diagnosis not present

## 2022-11-07 DIAGNOSIS — F419 Anxiety disorder, unspecified: Secondary | ICD-10-CM | POA: Insufficient documentation

## 2022-11-07 DIAGNOSIS — G894 Chronic pain syndrome: Secondary | ICD-10-CM

## 2022-11-07 DIAGNOSIS — Z86711 Personal history of pulmonary embolism: Secondary | ICD-10-CM | POA: Diagnosis not present

## 2022-11-07 DIAGNOSIS — Z8673 Personal history of transient ischemic attack (TIA), and cerebral infarction without residual deficits: Secondary | ICD-10-CM | POA: Diagnosis not present

## 2022-11-07 DIAGNOSIS — M25562 Pain in left knee: Secondary | ICD-10-CM | POA: Diagnosis not present

## 2022-11-07 DIAGNOSIS — Z5181 Encounter for therapeutic drug level monitoring: Secondary | ICD-10-CM | POA: Diagnosis not present

## 2022-11-07 DIAGNOSIS — E282 Polycystic ovarian syndrome: Secondary | ICD-10-CM | POA: Insufficient documentation

## 2022-11-07 DIAGNOSIS — E1122 Type 2 diabetes mellitus with diabetic chronic kidney disease: Secondary | ICD-10-CM | POA: Insufficient documentation

## 2022-11-07 DIAGNOSIS — Z7984 Long term (current) use of oral hypoglycemic drugs: Secondary | ICD-10-CM | POA: Insufficient documentation

## 2022-11-07 MED ORDER — OXYCODONE-ACETAMINOPHEN 10-325 MG PO TABS: 1.0000 | ORAL_TABLET | Freq: Four times a day (QID) | ORAL | 0 refills | Status: DC | PRN

## 2022-11-07 NOTE — Progress Notes (Signed)
Subjective:    Patient ID: Kristen [name], female    DOB: 20-Sep-1966, 57 y.o.   MRN: [id number]  HPIL Kristen [name] is a 57 y.o. female who returns for follow up appointment for chronic pain and medication refill. She states her pain is located in her right elbow, lower back pain mainly right side and right knee pain. She rates her pain 8. Her current exercise regime is walking and performing stretching exercises.  Ms. Vellucci tripped over a stool and landed on her right side on 10/04/2022, EMS was called and she was brought to [facility].  DG Right Elbow:  IMPRESSION: Displaced fracture of the olecranon process. Cross-sectional imaging  DG Right Knee:  IMPRESSION: 1. No evidence of fracture or dislocation. 2. Mild-to-moderate tricompartmental osteoarthritis.   DG Right Hip:  MPRESSION: 1. No fracture or dislocation. 2. 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   DG: Right Hip:  IMPRESSION: 1. No fracture or dislocation. 2. Moderate acromioclavicular osteoarthritis.  CT Head: Cervical Spine:  IMPRESSION: 1. No acute intracranial CT findings or depressed skull fractures. 2. 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 3. No evidence of cervical fractures or malalignment, but evaluation limited due to habitus and positioning. 4. Mild degenerative changes of the cervical spine. 5. Carotid atherosclerosis.   CT Right Elbow:  IMPRESSION: 1. Acute comminuted fracture through the olecranon process of the proximal ulna with moderate distraction. 2. Mild posterior subluxation of the trochlea relative to the proximal ulna without dislocation. 3. Moderate-sized elbow joint lipohemarthrosis. 4. Prominent soft tissue swelling with ill-defined hematoma at the posterior aspect of the elbow and proximal forearm. 5. 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  Ms. Mcquaig underwent : on 10/15/2022 OPEN REDUCTION INTERNAL  FIXATION (ORIF) OLECRANON FRACTURE Right General    Ms. Levinson Morphine equivalent is 60.00 MME.   Last Oral Swab was Performed on 09/11/2023, it was consistent.      Pain Inventory Average Pain 7 Pain Right Now 8 My pain is constant, sharp, burning, stabbing, and aching  In the last 24 hours, has pain interfered with the following? General activity 8 Relation with others 8 Enjoyment of life 8 What TIME of day is your pain at its worst? morning , daytime, evening, and night Sleep (in general) Poor  Pain is worse with: walking, bending, standing, and some activites Pain improves with: rest, heat/ice, and medication Relief from Meds: 6  Family History  Problem Relation Age of Onset   Hypertension Maternal Grandmother    Stroke Maternal Grandmother    Diabetes Maternal Grandfather    Cancer Mother        lung   Cancer Father    Diabetes Sister    Breast cancer Cousin    Heart attack Neg Hx    Social History   Socioeconomic History   Marital status: Single    Spouse name: Not on file   Number of children: Not on file   Years of education: Not on file   Highest education level: Not on file  Occupational History   Not on file  Tobacco Use   Smoking status: Never   Smokeless tobacco: Never  Vaping Use   Vaping Use: Never used  Substance and Sexual Activity   Alcohol use: No    Alcohol/week: 0.0 standard drinks of alcohol   Drug use: No   Sexual activity: Not Currently    Comment: 1st intercourse 88 yo-1 partner  Other Topics Concern  Not on file  Social History Narrative   Not on file   Social Determinants of Health   Financial Resource Strain: Not on file  Food Insecurity: Not on file  Transportation Needs: Not on file  Physical Activity: Not on file  Stress: Not on file  Social Connections: Not on file   Past Surgical History:  Procedure Laterality Date   ABDOMINAL SURGERY     hernia repair   CHOLECYSTECTOMY  [id number]   COLONOSCOPY WITH  PROPOFOL N/A 07/31/2016   Procedure: COLONOSCOPY WITH PROPOFOL;  Surgeon: Arta Silence, MD;  Location: [facility];  Service: Endoscopy;  Laterality: N/A;   COLONOSCOPY WITH PROPOFOL N/A 03/08/2020   Procedure: COLONOSCOPY WITH PROPOFOL;  Surgeon: Arta Silence, MD;  Location: [facility];  Service: Endoscopy;  Laterality: N/A;   FOOT SURGERY Right 06/2007   IR RADIOLOGIST EVAL & MGMT  12/14/2019   KNEE ARTHROSCOPY  09/2008   left   KNEE ARTHROSCOPY  april 2011   left   NASAL SINUS SURGERY     NASAL SINUS SURGERY  1990   ORIF ELBOW FRACTURE Right 10/15/2022   Procedure: OPEN REDUCTION INTERNAL FIXATION (ORIF) OLECRANON FRACTURE;  Surgeon: Georgeanna Harrison, MD;  Location: [facility];  Service: Orthopedics;  Laterality: Right;   POLYPECTOMY  03/08/2020   Procedure: POLYPECTOMY;  Surgeon: Arta Silence, MD;  Location: [facility];  Service: Endoscopy;;   SHOULDER SURGERY  2004   left   Past Surgical History:  Procedure Laterality Date   ABDOMINAL SURGERY     hernia repair   CHOLECYSTECTOMY  [id number]   COLONOSCOPY WITH PROPOFOL N/A 07/31/2016   Procedure: COLONOSCOPY WITH PROPOFOL;  Surgeon: Arta Silence, MD;  [facility]: [facility];  Service: Endoscopy;  Laterality: N/A;   COLONOSCOPY WITH PROPOFOL N/A 03/08/2020   Procedure: COLONOSCOPY WITH PROPOFOL;  Surgeon: Arta Silence, MD;  Location: [facility];  Service: Endoscopy;  Laterality: N/A;   FOOT SURGERY Right 06/2007   IR RADIOLOGIST EVAL & MGMT  12/14/2019   KNEE ARTHROSCOPY  09/2008   left   KNEE ARTHROSCOPY  april 2011   left   NASAL SINUS SURGERY     NASAL SINUS SURGERY  1990   ORIF ELBOW FRACTURE Right 10/15/2022   Procedure: OPEN REDUCTION INTERNAL FIXATION (ORIF) OLECRANON FRACTURE;  Surgeon: Georgeanna Harrison, MD;  Location: [facility];  Service: Orthopedics;  Laterality: Right;   POLYPECTOMY  03/08/2020   Procedure: POLYPECTOMY;  Surgeon: Arta Silence, MD;  [facility];  Service: Endoscopy;;   SHOULDER SURGERY   2004   left   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   Depression    Diabetes mellitus    History of pulmonary embolus (PE)    Hypertension    Migraines    Near syncope    Obesity    PCOS (polycystic ovarian syndrome)    Peripheral edema    Sleep apnea    uses cpap set on 10   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       PAIN IN THE RIGHT KNEE, RIGHT ARM/ELBOW  All other systems reviewed and are negative.      Objective:   Physical Exam Vitals and nursing note reviewed.  Constitutional:      Appearance: Normal appearance.  Cardiovascular:     Rate and Rhythm: Normal rate and regular rhythm.     Pulses: Normal pulses.     Heart sounds: Normal heart sounds.  Pulmonary:     Effort: Pulmonary effort is normal.     Breath sounds: Wheezing present.  Genitourinary:    Comments: Suprapubic Catheter: Yellow urine with sediment Musculoskeletal:     Cervical back: Normal range of motion and neck supple.     Comments: Normal Muscle Bulk and Muscle Testing Reveals:  Upper Extremities: Right: Upper Extremity: Decreased ROM 30 Degrees  and Muscle Strength  5/5 Wearing Right Sling Left Upper Extremity: Full ROM and Muscle Strength 5/5 Lumbar Hypersensitivity Lower Extremities: Full ROM and Muscle Strength 5/5 Arrived in wheelchair     Skin:    General: Skin is warm and  dry.  Neurological:     Mental Status: She is alert and oriented to person, place, and time.  Psychiatric:        Mood and Affect: Mood normal.        Behavior: Behavior normal.         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 She verbalizes understanding. Marland Kitchen01/18/2024 Refilled: Oxycodone 10/325 mg one tablet every 8 hours as needed #90.  Second script e-scribe for the following month. We will continue the opioid monitoring program, this consists of regular clinic visits, examinations, urine drug screen, pill counts as well as use of [place] Controlled Substance Reporting system. A 12 month History has been reviewed on the [place] Controlled Substance Reporting System on 11/07/2022. 2. Morbid obesity: Continue Healthy Diet Regime and HEP. 01/18//2024 3. Type II diabetes:  Dr. Buddy Duty following. 11/07/2022 4. Reactive Depression: Continue Zoloft. 11/07/2022. 5. Myofascial Muscle Pain: Continue current medication regimen with [facility]. 11/07/2022 6. Bilateral  Chronic Knee Pain: Continue HEP as Tolerated. Continue to Monitor. 11/07/2022 7. Right Greater Trochanter Bursitis:  No complaints today. Continue to alternate Ice and Heat Therapy. Continue to monitor. 11/07/2022 8. Fall at Home: Educated on [place] Prevention: She verbalizes understanding.    F/U in 2 months

## 2022-11-07 NOTE — Progress Notes (Deleted)
Subjective:    Patient ID: Kristen [name], female    DOB: 08-Jul-1966, 57 y.o.   MRN: [id number]  HPI   Pain Inventory Average Pain {NUMBERS; 0-10:5044} Pain Right Now {NUMBERS; 0-10:5044} My pain is {PAIN DESCRIPTION:21022940}  In the last 24 hours, has pain interfered with the following? General activity {NUMBERS; 0-10:5044} Relation with others {NUMBERS; 0-10:5044} Enjoyment of life {NUMBERS; 0-10:5044} What TIME of day is your pain at its worst? {time of day:24191} Sleep (in general) {BHH GOOD/FAIR/POOR:22877}  Pain is worse with: {ACTIVITIES:21022942} Pain improves with: {PAIN IMPROVES HERD:40814481} Relief from Meds: {NUMBERS; 0-10:5044}  Family History  Problem Relation Age of Onset   Hypertension Maternal Grandmother    Stroke Maternal Grandmother    Diabetes Maternal Grandfather    Cancer Mother        lung   Cancer Father    Diabetes Sister    Breast cancer Cousin    Heart attack Neg Hx    Social History   Socioeconomic History   Marital status: Single    Spouse name: Not on file   Number of children: Not on file   Years of education: Not on file   Highest education level: Not on file  Occupational History   Not on file  Tobacco Use   Smoking status: Never   Smokeless tobacco: Never  Vaping Use   Vaping Use: Never used  Substance and Sexual Activity   Alcohol use: No    Alcohol/week: 0.0 standard drinks of alcohol   Drug use: No   Sexual activity: Not Currently    Comment: 1st intercourse 76 yo-1 partner  Other Topics Concern   Not on file  Social History Narrative   Not on file   Social Determinants of Health   Financial Resource Strain: Not on file  Food Insecurity: Not on file  Transportation Needs: Not on file  Physical Activity: Not on file  Stress: Not on file  Social Connections: Not on file   Past Surgical History:  Procedure Laterality Date   ABDOMINAL SURGERY     hernia repair   CHOLECYSTECTOMY  [id number]   COLONOSCOPY  WITH PROPOFOL N/A 07/31/2016   Procedure: COLONOSCOPY WITH PROPOFOL;  Surgeon: Arta Silence, MD;  [facility];  Service: Endoscopy;  Laterality: N/A;   COLONOSCOPY WITH PROPOFOL N/A 03/08/2020   Procedure: COLONOSCOPY WITH PROPOFOL;  Surgeon: Arta Silence, MD;  [facility];  Service: Endoscopy;  Laterality: N/A;   FOOT SURGERY Right 06/2007   IR RADIOLOGIST EVAL & MGMT  12/14/2019   KNEE ARTHROSCOPY  09/2008   left   KNEE ARTHROSCOPY  april 2011   left   NASAL SINUS SURGERY     NASAL SINUS SURGERY  1990   ORIF ELBOW FRACTURE Right 10/15/2022   Procedure: OPEN REDUCTION INTERNAL FIXATION (ORIF) OLECRANON FRACTURE;  Surgeon: Georgeanna Harrison, MD;  Location: [place];  Service: Orthopedics;  Laterality: Right;   POLYPECTOMY  03/08/2020   Procedure: POLYPECTOMY;  Surgeon: Arta Silence, MD;  [facility];  Service: Endoscopy;;   SHOULDER SURGERY  2004   left   Past Surgical History:  Procedure Laterality Date   ABDOMINAL SURGERY     hernia repair   CHOLECYSTECTOMY  [id number]   COLONOSCOPY WITH PROPOFOL N/A 07/31/2016   Procedure: COLONOSCOPY WITH PROPOFOL;  Surgeon: Arta Silence, MD;  [facility];  Service: Endoscopy;  Laterality: N/A;   COLONOSCOPY WITH PROPOFOL N/A 03/08/2020   Procedure: COLONOSCOPY WITH PROPOFOL;  Surgeon: Arta Silence,  MD;  [facility];  Service: Endoscopy;  Laterality: N/A;   FOOT SURGERY Right 06/2007   IR RADIOLOGIST EVAL & MGMT  12/14/2019   KNEE ARTHROSCOPY  09/2008   left   KNEE ARTHROSCOPY  april 2011   left   NASAL SINUS SURGERY     NASAL SINUS SURGERY  1990   ORIF ELBOW FRACTURE Right 10/15/2022   Procedure: OPEN REDUCTION INTERNAL FIXATION (ORIF) OLECRANON FRACTURE;  Surgeon: Georgeanna Harrison, MD;  Location: [place];  Service: Orthopedics;  Laterality: Right;   POLYPECTOMY  03/08/2020   Procedure: POLYPECTOMY;  Surgeon: Arta Silence, MD;  [facility];  Service: Endoscopy;;   SHOULDER  SURGERY  2004   left   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   Depression    Diabetes mellitus    History of pulmonary embolus (PE)    Hypertension    Migraines    Near syncope    Obesity    PCOS (polycystic ovarian syndrome)    Peripheral edema    Sleep apnea    uses cpap set on 10   Stroke ([place]) 12/2011   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

## 2022-11-22 DIAGNOSIS — S52031A Displaced fracture of olecranon process with intraarticular extension of right ulna, initial encounter for closed fracture: Secondary | ICD-10-CM | POA: Diagnosis not present

## 2022-11-27 DIAGNOSIS — M25621 Stiffness of right elbow, not elsewhere classified: Secondary | ICD-10-CM | POA: Diagnosis not present

## 2022-11-27 DIAGNOSIS — N3946 Mixed incontinence: Secondary | ICD-10-CM | POA: Diagnosis not present

## 2022-11-27 DIAGNOSIS — S52031D Displaced fracture of olecranon process with intraarticular extension of right ulna, subsequent encounter for closed fracture with routine healing: Secondary | ICD-10-CM | POA: Diagnosis not present

## 2022-11-28 DIAGNOSIS — N183 Chronic kidney disease, stage 3 unspecified: Secondary | ICD-10-CM | POA: Diagnosis not present

## 2022-11-28 DIAGNOSIS — E1122 Type 2 diabetes mellitus with diabetic chronic kidney disease: Secondary | ICD-10-CM | POA: Diagnosis not present

## 2022-11-28 DIAGNOSIS — I10 Essential (primary) hypertension: Secondary | ICD-10-CM | POA: Diagnosis not present

## 2022-11-28 DIAGNOSIS — E1165 Type 2 diabetes mellitus with hyperglycemia: Secondary | ICD-10-CM | POA: Diagnosis not present

## 2022-12-04 DIAGNOSIS — Z9359 Other cystostomy status: Secondary | ICD-10-CM | POA: Diagnosis not present

## 2022-12-04 DIAGNOSIS — N393 Stress incontinence (female) (male): Secondary | ICD-10-CM | POA: Diagnosis not present

## 2022-12-06 DIAGNOSIS — M25621 Stiffness of right elbow, not elsewhere classified: Secondary | ICD-10-CM | POA: Diagnosis not present

## 2022-12-06 DIAGNOSIS — S52031D Displaced fracture of olecranon process with intraarticular extension of right ulna, subsequent encounter for closed fracture with routine healing: Secondary | ICD-10-CM | POA: Diagnosis not present

## 2022-12-11 DIAGNOSIS — S52031D Displaced fracture of olecranon process with intraarticular extension of right ulna, subsequent encounter for closed fracture with routine healing: Secondary | ICD-10-CM | POA: Diagnosis not present

## 2022-12-11 DIAGNOSIS — M25621 Stiffness of right elbow, not elsewhere classified: Secondary | ICD-10-CM | POA: Diagnosis not present

## 2022-12-16 DIAGNOSIS — I10 Essential (primary) hypertension: Secondary | ICD-10-CM | POA: Diagnosis not present

## 2022-12-16 DIAGNOSIS — J45909 Unspecified asthma, uncomplicated: Secondary | ICD-10-CM | POA: Diagnosis not present

## 2022-12-16 DIAGNOSIS — E7800 Pure hypercholesterolemia, unspecified: Secondary | ICD-10-CM | POA: Diagnosis not present

## 2022-12-16 DIAGNOSIS — E1165 Type 2 diabetes mellitus with hyperglycemia: Secondary | ICD-10-CM | POA: Diagnosis not present

## 2022-12-16 DIAGNOSIS — F324 Major depressive disorder, single episode, in partial remission: Secondary | ICD-10-CM | POA: Diagnosis not present

## 2022-12-19 DIAGNOSIS — S52031D Displaced fracture of olecranon process with intraarticular extension of right ulna, subsequent encounter for closed fracture with routine healing: Secondary | ICD-10-CM | POA: Diagnosis not present

## 2022-12-19 DIAGNOSIS — M25621 Stiffness of right elbow, not elsewhere classified: Secondary | ICD-10-CM | POA: Diagnosis not present

## 2022-12-25 DIAGNOSIS — Z933 Colostomy status: Secondary | ICD-10-CM | POA: Diagnosis not present

## 2022-12-25 DIAGNOSIS — Z466 Encounter for fitting and adjustment of urinary device: Secondary | ICD-10-CM | POA: Diagnosis not present

## 2022-12-25 DIAGNOSIS — M25621 Stiffness of right elbow, not elsewhere classified: Secondary | ICD-10-CM | POA: Diagnosis not present

## 2022-12-25 DIAGNOSIS — S52031D Displaced fracture of olecranon process with intraarticular extension of right ulna, subsequent encounter for closed fracture with routine healing: Secondary | ICD-10-CM | POA: Diagnosis not present

## 2023-01-01 ENCOUNTER — Ambulatory Visit: Payer: Medicare HMO | Admitting: Registered Nurse

## 2023-01-03 ENCOUNTER — Encounter: Payer: Medicare HMO | Attending: Registered Nurse | Admitting: Registered Nurse

## 2023-01-03 ENCOUNTER — Encounter: Payer: Self-pay | Admitting: Registered Nurse

## 2023-01-03 VITALS — BP 160/81 | HR 91 | Ht 68.0 in | Wt 385.6 lb

## 2023-01-03 DIAGNOSIS — Z79891 Long term (current) use of opiate analgesic: Secondary | ICD-10-CM | POA: Diagnosis not present

## 2023-01-03 DIAGNOSIS — M1712 Unilateral primary osteoarthritis, left knee: Secondary | ICD-10-CM | POA: Insufficient documentation

## 2023-01-03 DIAGNOSIS — M1711 Unilateral primary osteoarthritis, right knee: Secondary | ICD-10-CM | POA: Insufficient documentation

## 2023-01-03 DIAGNOSIS — G894 Chronic pain syndrome: Secondary | ICD-10-CM | POA: Insufficient documentation

## 2023-01-03 DIAGNOSIS — M47816 Spondylosis without myelopathy or radiculopathy, lumbar region: Secondary | ICD-10-CM | POA: Insufficient documentation

## 2023-01-03 DIAGNOSIS — S52031D Displaced fracture of olecranon process with intraarticular extension of right ulna, subsequent encounter for closed fracture with routine healing: Secondary | ICD-10-CM | POA: Diagnosis not present

## 2023-01-03 DIAGNOSIS — Z5181 Encounter for therapeutic drug level monitoring: Secondary | ICD-10-CM | POA: Diagnosis not present

## 2023-01-03 DIAGNOSIS — S52031A Displaced fracture of olecranon process with intraarticular extension of right ulna, initial encounter for closed fracture: Secondary | ICD-10-CM | POA: Diagnosis not present

## 2023-01-03 DIAGNOSIS — M25621 Stiffness of right elbow, not elsewhere classified: Secondary | ICD-10-CM | POA: Diagnosis not present

## 2023-01-03 MED ORDER — OXYCODONE-ACETAMINOPHEN 10-325 MG PO TABS: 1.0000 | ORAL_TABLET | Freq: Four times a day (QID) | ORAL | 0 refills | Status: DC | PRN

## 2023-01-03 NOTE — Progress Notes (Addendum)
Subjective:    Patient ID: Kristen [name], female    DOB: 02-10-1966, 57 y.o.   MRN: [id number]  HPI: Kristen [name] is a 57 y.o. female who returns for follow up appointment for chronic pain and medication refill. She states her pain is located in her lower back, she also reports right arm post surgical pain.She  rates her pain 7. Her current exercise regime is attending physical therapy weekly, walking and performing stretching exercises.  Kristen [name] Morphine equivalent is 60.00 MME.   Oral Swab was Performed today.     Pain Inventory Average Pain 7 Pain Right Now 7 My pain is constant, sharp, burning, dull, stabbing, and aching  In the last 24 hours, has pain interfered with the following? General activity 10 Relation with others 10 Enjoyment of life 10 What TIME of day is your pain at its worst? morning , daytime, evening, and night Sleep (in general) Poor  Pain is worse with: walking, bending, and standing Pain improves with: rest and medication Relief from Meds: 6  Family History  Problem Relation Age of Onset  . Hypertension Maternal Grandmother   . Stroke Maternal Grandmother   . Diabetes Maternal Grandfather   . Cancer Mother        lung  . Cancer Father   . Diabetes Sister   . Breast cancer Cousin   . Heart attack Neg Hx    Social History   Socioeconomic History  . Marital status: Single    Spouse name: Not on file  . Number of children: Not on file  . Years of education: Not on file  . Highest education level: Not on file  Occupational History  . Not on file  Tobacco Use  . Smoking status: Never  . Smokeless tobacco: Never  Vaping Use  . Vaping Use: Never used  Substance and Sexual Activity  . Alcohol use: No    Alcohol/week: 0.0 standard drinks of alcohol  . Drug use: No  . Sexual activity: Not Currently    Comment: 1st intercourse 56 yo-1 partner  Other Topics Concern  . Not on file  Social History Narrative  . Not on file    Social Determinants of Health   Financial Resource Strain: Not on file  Food Insecurity: Not on file  Transportation Needs: Not on file  Physical Activity: Not on file  Stress: Not on file  Social Connections: Not on file   Past Surgical History:  Procedure Laterality Date  . ABDOMINAL SURGERY     hernia repair  . CHOLECYSTECTOMY  [id number]  . COLONOSCOPY WITH PROPOFOL N/A 07/31/2016   Procedure: COLONOSCOPY WITH PROPOFOL;  Surgeon: Arta Silence, MD;  [facility];  Service: Endoscopy;  Laterality: N/A;  . COLONOSCOPY WITH PROPOFOL N/A 03/08/2020   Procedure: COLONOSCOPY WITH PROPOFOL;  Surgeon: Arta Silence, MD;  [facility];  Service: Endoscopy;  Laterality: N/A;  . FOOT SURGERY Right 06/2007  . IR RADIOLOGIST EVAL & MGMT  12/14/2019  . KNEE ARTHROSCOPY  09/2008   left  . KNEE ARTHROSCOPY  april 2011   left  . NASAL SINUS SURGERY    . NASAL SINUS SURGERY  1990  . ORIF ELBOW FRACTURE Right 10/15/2022   Procedure: OPEN REDUCTION INTERNAL FIXATION (ORIF) OLECRANON FRACTURE;  Surgeon: Georgeanna Harrison, MD;  Location: [place];  Service: Orthopedics;  Laterality: Right;  . POLYPECTOMY  03/08/2020   Procedure: POLYPECTOMY;  Surgeon: Arta Silence, MD;  [facility];  Service: Endoscopy;;  .  SHOULDER SURGERY  2004   left   Past Surgical History:  Procedure Laterality Date  . ABDOMINAL SURGERY     hernia repair  . CHOLECYSTECTOMY  [id number]  . COLONOSCOPY WITH PROPOFOL N/A 07/31/2016   Procedure: COLONOSCOPY WITH PROPOFOL;  Surgeon: Arta Silence, MD;  [facility];  Service: Endoscopy;  Laterality: N/A;  . COLONOSCOPY WITH PROPOFOL N/A 03/08/2020   Procedure: COLONOSCOPY WITH PROPOFOL;  Surgeon: Arta Silence, MD;  [facility];  Service: Endoscopy;  Laterality: N/A;  . FOOT SURGERY Right 06/2007  . IR RADIOLOGIST EVAL & MGMT  12/14/2019  . KNEE ARTHROSCOPY  09/2008   left  . KNEE ARTHROSCOPY  april 2011   left  . NASAL  SINUS SURGERY    . NASAL SINUS SURGERY  1990  . ORIF ELBOW FRACTURE Right 10/15/2022   Procedure: OPEN REDUCTION INTERNAL FIXATION (ORIF) OLECRANON FRACTURE;  Surgeon: Georgeanna Harrison, MD;  Location: [place];  Service: Orthopedics;  Laterality: Right;  . POLYPECTOMY  03/08/2020   Procedure: POLYPECTOMY;  Surgeon: Arta Silence, MD;  [facility];  Service: Endoscopy;;  . SHOULDER SURGERY  2004   left   Past Medical History:  Diagnosis Date  . Anxiety   . Asthma   . Chronic kidney disease   . 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 Depression   . Diabetes mellitus   . History of pulmonary embolus (PE)   . Hypertension   . Migraines   . Near syncope   . Obesity   . PCOS (polycystic ovarian syndrome)   . Peripheral edema   . Sleep apnea    uses cpap set on 10  . Stroke ([place]) 12/2011   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    BP (!) 155/97 (BP Location: Left Wrist)   Pulse 92   Ht 5\' 8"  (1.727 m)   Wt (!) 385 lb 9.6 oz (174.9 kg)   LMP  (LMP Unknown)   SpO2 97%   BMI 58.63 kg/m   Opioid Risk Score:   Fall Risk Score:  `1  Depression screen PHQ 2/9     01/03/2023   12:01 PM 11/07/2022    1:20 PM 07/10/2022    1:46 PM 05/14/2022    1:43 PM 03/13/2022    9:22 AM 01/07/2022   11:39 AM 11/07/2021   11:57 AM  Depression screen PHQ 2/9  Decreased Interest 3 1 0 1 2 0 1  Down, Depressed, Hopeless 3 1 0 1 2 3 1   PHQ - 2 Score 6 2 0 2 4 3 2      Review of Systems  Constitutional: Negative.   HENT: Negative.    Eyes: Negative.   Respiratory: Negative.    Cardiovascular: Negative.   Gastrointestinal: Negative.   Endocrine: Negative.   Genitourinary: Negative.   Musculoskeletal:  Positive for back pain and gait problem.  Skin: Negative.   Allergic/Immunologic: Negative.   Hematological: Negative.   Psychiatric/Behavioral: Negative.    All other systems reviewed and are negative.      Objective:   Physical Exam Vitals and nursing note reviewed.  Constitutional:       Appearance: Normal appearance.  Cardiovascular:     Rate and Rhythm: Normal rate and regular rhythm.     Pulses: Normal pulses.     Heart sounds: Normal heart sounds.  Pulmonary:     Effort: Pulmonary effort is normal.     Breath sounds: Normal breath sounds.  Genitourinary:  Comments: Suprapubic Catheter: Yellow urine draining. Musculoskeletal:     Cervical back: Normal range of motion and neck supple.     Comments: Normal Muscle Bulk and Muscle Testing Reveals:  Upper Extremities: Full ROM and Muscle Strength  5/5 Lumbar Paraspinal Tenderness: L-3-L-5 Lower Extremities: Full ROM and Muscle Strength 5/5 Arises from Table slowly Antalgic Gait     Skin:    General: Skin is warm and dry.  Neurological:     Mental Status: She is alert and oriented to person, place, and time.  Psychiatric:        Mood and Affect: Mood normal.        Behavior: Behavior normal.         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 Continue to monitor. Marland Kitchen03/15/2024 Refilled: Oxycodone 10/325 mg one tablet every 8 hours as needed #90.  Second script e-scribe for the following month. We will continue the opioid monitoring program, this consists of regular clinic visits, examinations, urine drug screen, pill counts as well as use of [place] Controlled Substance Reporting system. A 12 month History has been reviewed on the [place] Controlled Substance Reporting System on 01/03/2023. 2. Morbid obesity: Continue Healthy Diet Regime and HEP. 03/15//2024 3. Type II diabetes:  Dr. Buddy Duty following. 01/03/2023 4. Reactive Depression: Continue Zoloft. 01/03/2023. 5. Myofascial Muscle Pain: Continue current medication regimen with Soma. 01/03/2023 6. Bilateral  Chronic Knee Pain: Continue HEP as Tolerated. Continue to Monitor. 01/03/2023 7. Right Greater Trochanter Bursitis:  No complaints today. Continue to alternate Ice and  Heat Therapy. Continue to monitor. 01/03/2023    F/U in 2 months

## 2023-01-08 DIAGNOSIS — S52031D Displaced fracture of olecranon process with intraarticular extension of right ulna, subsequent encounter for closed fracture with routine healing: Secondary | ICD-10-CM | POA: Diagnosis not present

## 2023-01-08 DIAGNOSIS — M25621 Stiffness of right elbow, not elsewhere classified: Secondary | ICD-10-CM | POA: Diagnosis not present

## 2023-01-08 LAB — DRUG TOX MONITOR 1 W/CONF, ORAL FLD
Amphetamines: NEGATIVE ng/mL (ref <10)
Barbiturates: NEGATIVE ng/mL (ref <10)
Benzodiazepines: NEGATIVE ng/mL (ref <0.50)
Buprenorphine: NEGATIVE ng/mL (ref <0.10)
Carisoprodol: 134 ng/mL — ABNORMAL HIGH (ref <2.5)
Cocaine: NEGATIVE ng/mL (ref <5.0)
Codeine: NEGATIVE ng/mL (ref <2.5)
Dihydrocodeine: NEGATIVE ng/mL (ref <2.5)
Fentanyl: NEGATIVE ng/mL (ref <0.10)
Heroin Metabolite: NEGATIVE ng/mL (ref <1.0)
Hydrocodone: NEGATIVE ng/mL (ref <2.5)
Hydromorphone: NEGATIVE ng/mL (ref <2.5)
MARIJUANA: NEGATIVE ng/mL (ref <2.5)
MDMA: NEGATIVE ng/mL (ref <10)
Meprobamate: >250 ng/mL — ABNORMAL HIGH (ref <2.5)
Meprobamate: POSITIVE ng/mL — AB (ref <2.5)
Methadone: NEGATIVE ng/mL (ref <5.0)
Morphine: NEGATIVE ng/mL (ref <2.5)
Nicotine Metabolite: NEGATIVE ng/mL (ref <5.0)
Norhydrocodone: NEGATIVE ng/mL (ref <2.5)
Noroxycodone: 33.2 ng/mL — ABNORMAL HIGH (ref <2.5)
Opiates: POSITIVE ng/mL — AB (ref <2.5)
Oxycodone: 35.2 ng/mL — ABNORMAL HIGH (ref <2.5)
Oxymorphone: NEGATIVE ng/mL (ref <2.5)
Phencyclidine: NEGATIVE ng/mL (ref <10)
Tapentadol: NEGATIVE ng/mL (ref <5.0)
Tramadol: NEGATIVE ng/mL (ref <5.0)
Zolpidem: NEGATIVE ng/mL (ref <5.0)

## 2023-01-08 LAB — DRUG TOX ALC METAB W/CON, ORAL FLD: Alcohol Metabolite: NEGATIVE ng/mL (ref <25)

## 2023-01-14 DIAGNOSIS — Z6841 Body Mass Index (BMI) 40.0 and over, adult: Secondary | ICD-10-CM | POA: Diagnosis not present

## 2023-01-14 DIAGNOSIS — J029 Acute pharyngitis, unspecified: Secondary | ICD-10-CM | POA: Diagnosis not present

## 2023-01-14 DIAGNOSIS — R059 Cough, unspecified: Secondary | ICD-10-CM | POA: Diagnosis not present

## 2023-01-14 DIAGNOSIS — J019 Acute sinusitis, unspecified: Secondary | ICD-10-CM | POA: Diagnosis not present

## 2023-01-14 DIAGNOSIS — Z03818 Encounter for observation for suspected exposure to other biological agents ruled out: Secondary | ICD-10-CM | POA: Diagnosis not present

## 2023-01-21 ENCOUNTER — Telehealth: Payer: Self-pay | Admitting: *Deleted

## 2023-01-21 ENCOUNTER — Other Ambulatory Visit: Payer: Self-pay | Admitting: Family Medicine

## 2023-01-21 DIAGNOSIS — Z1231 Encounter for screening mammogram for malignant neoplasm of breast: Secondary | ICD-10-CM

## 2023-01-21 NOTE — Telephone Encounter (Signed)
Oral swab drug screen was consistent for prescribed medications.  ?

## 2023-01-22 DIAGNOSIS — Z9359 Other cystostomy status: Secondary | ICD-10-CM | POA: Diagnosis not present

## 2023-01-23 DIAGNOSIS — R399 Unspecified symptoms and signs involving the genitourinary system: Secondary | ICD-10-CM | POA: Diagnosis not present

## 2023-01-23 DIAGNOSIS — D6859 Other primary thrombophilia: Secondary | ICD-10-CM | POA: Diagnosis not present

## 2023-01-23 DIAGNOSIS — Z6841 Body Mass Index (BMI) 40.0 and over, adult: Secondary | ICD-10-CM | POA: Diagnosis not present

## 2023-02-12 DIAGNOSIS — N393 Stress incontinence (female) (male): Secondary | ICD-10-CM | POA: Diagnosis not present

## 2023-02-12 DIAGNOSIS — Z466 Encounter for fitting and adjustment of urinary device: Secondary | ICD-10-CM | POA: Diagnosis not present

## 2023-02-18 DIAGNOSIS — E1122 Type 2 diabetes mellitus with diabetic chronic kidney disease: Secondary | ICD-10-CM | POA: Diagnosis not present

## 2023-02-18 DIAGNOSIS — I10 Essential (primary) hypertension: Secondary | ICD-10-CM | POA: Diagnosis not present

## 2023-02-18 DIAGNOSIS — E113293 Type 2 diabetes mellitus with mild nonproliferative diabetic retinopathy without macular edema, bilateral: Secondary | ICD-10-CM | POA: Diagnosis not present

## 2023-02-18 DIAGNOSIS — Z794 Long term (current) use of insulin: Secondary | ICD-10-CM | POA: Diagnosis not present

## 2023-02-18 DIAGNOSIS — E1165 Type 2 diabetes mellitus with hyperglycemia: Secondary | ICD-10-CM | POA: Diagnosis not present

## 2023-02-18 DIAGNOSIS — Z9181 History of falling: Secondary | ICD-10-CM | POA: Diagnosis not present

## 2023-02-18 DIAGNOSIS — Z6841 Body Mass Index (BMI) 40.0 and over, adult: Secondary | ICD-10-CM | POA: Diagnosis not present

## 2023-03-05 ENCOUNTER — Encounter: Payer: Medicare HMO | Attending: Registered Nurse | Admitting: Registered Nurse

## 2023-03-05 VITALS — BP 123/73 | HR 94 | Ht 68.0 in | Wt 384.0 lb

## 2023-03-05 DIAGNOSIS — G894 Chronic pain syndrome: Secondary | ICD-10-CM | POA: Diagnosis not present

## 2023-03-05 DIAGNOSIS — G8929 Other chronic pain: Secondary | ICD-10-CM

## 2023-03-05 DIAGNOSIS — Z79899 Other long term (current) drug therapy: Secondary | ICD-10-CM

## 2023-03-05 DIAGNOSIS — Z79891 Long term (current) use of opiate analgesic: Secondary | ICD-10-CM | POA: Diagnosis not present

## 2023-03-05 DIAGNOSIS — M170 Bilateral primary osteoarthritis of knee: Secondary | ICD-10-CM | POA: Diagnosis not present

## 2023-03-05 DIAGNOSIS — Z5181 Encounter for therapeutic drug level monitoring: Secondary | ICD-10-CM | POA: Insufficient documentation

## 2023-03-05 DIAGNOSIS — M1711 Unilateral primary osteoarthritis, right knee: Secondary | ICD-10-CM | POA: Diagnosis not present

## 2023-03-05 DIAGNOSIS — M7918 Myalgia, other site: Secondary | ICD-10-CM | POA: Insufficient documentation

## 2023-03-05 DIAGNOSIS — M1712 Unilateral primary osteoarthritis, left knee: Secondary | ICD-10-CM | POA: Insufficient documentation

## 2023-03-05 DIAGNOSIS — M47816 Spondylosis without myelopathy or radiculopathy, lumbar region: Secondary | ICD-10-CM | POA: Insufficient documentation

## 2023-03-05 MED ORDER — OXYCODONE-ACETAMINOPHEN 10-325 MG PO TABS: 1.0000 | ORAL_TABLET | Freq: Four times a day (QID) | ORAL | 0 refills | Status: DC | PRN

## 2023-03-05 NOTE — Progress Notes (Unsigned)
Subjective:    Patient ID: Kristen [name], female    DOB: [birth year], 57 y.o.   MRN: [id number]  HPI: Kristen [name] is a 57 y.o. female who returns for follow up appointment for chronic pain and medication refill. states *** pain is located in  ***. rates pain ***. current exercise regime is walking and performing stretching exercises.  [name] Morphine equivalent is *** MME.   Last Oral Swab was Performed on 01/03/2023, it was consistent.     Pain Inventory Average Pain 7 Pain Right Now 7 My pain is constant, sharp, burning, stabbing, and aching  In the last 24 hours, has pain interfered with the following? General activity 7 Relation with others 6 Enjoyment of life 9 What TIME of day is your pain at its worst? morning , daytime, evening, and night Sleep (in general) Poor  Pain is worse with: walking, bending, standing, and some activites Pain improves with: rest, heat/ice, and medication Relief from Meds: 6  Family History  Problem Relation Age of Onset   Hypertension Maternal Grandmother    Stroke Maternal Grandmother    Diabetes Maternal Grandfather    Cancer Mother        lung   Cancer Father    Diabetes Sister    Breast cancer Cousin    Heart attack Neg Hx    Social History   Socioeconomic History   Marital status: Single    Spouse name: Not on file   Number of children: Not on file   Years of education: Not on file   Highest education level: Not on file  Occupational History   Not on file  Tobacco Use   Smoking status: Never   Smokeless tobacco: Never  Vaping Use   Vaping Use: Never used  Substance and Sexual Activity   Alcohol use: No    Alcohol/week: 0.0 standard drinks of alcohol   Drug use: No   Sexual activity: Not Currently    Comment: 1st intercourse 3 yo-1 partner  Other Topics Concern   Not on file  Social History Narrative   Not on file   Social Determinants of Health   Financial Resource Strain: Not on file  Food  Insecurity: Not on file  Transportation Needs: Not on file  Physical Activity: Not on file  Stress: Not on file  Social Connections: Not on file   Past Surgical History:  Procedure Laterality Date   ABDOMINAL SURGERY     hernia repair   CHOLECYSTECTOMY  [id number]   COLONOSCOPY WITH PROPOFOL N/A 07/31/2016   Procedure: COLONOSCOPY WITH PROPOFOL;  Surgeon: Willis Modena, MD;  [facility];  Service: Endoscopy;  Laterality: N/A;   COLONOSCOPY WITH PROPOFOL N/A 03/08/2020   Procedure: COLONOSCOPY WITH PROPOFOL;  Surgeon: Willis Modena, MD;  [facility];  Service: Endoscopy;  Laterality: N/A;   FOOT SURGERY Right 06/2007   IR RADIOLOGIST EVAL & MGMT  12/14/2019   KNEE ARTHROSCOPY  09/2008   left   KNEE ARTHROSCOPY  april 2011   left   NASAL SINUS SURGERY     NASAL SINUS SURGERY  1990   ORIF ELBOW FRACTURE Right 10/15/2022   Procedure: OPEN REDUCTION INTERNAL FIXATION (ORIF) OLECRANON FRACTURE;  Surgeon: Ernestina Columbia, MD;  [facility];  Service: Orthopedics;  Laterality: Right;   POLYPECTOMY  03/08/2020   Procedure: POLYPECTOMY;  Surgeon: Willis Modena, MD;  [facility];  Service: Endoscopy;;   SHOULDER SURGERY  2004   left  Past Surgical History:  Procedure Laterality Date   ABDOMINAL SURGERY     hernia repair   CHOLECYSTECTOMY  [id number]   COLONOSCOPY WITH PROPOFOL N/A 07/31/2016   Procedure: COLONOSCOPY WITH PROPOFOL;  Surgeon: Willis Modena, MD;  [facility];  Service: Endoscopy;  Laterality: N/A;   COLONOSCOPY WITH PROPOFOL N/A 03/08/2020   Procedure: COLONOSCOPY WITH PROPOFOL;  Surgeon: Willis Modena, MD;  [facility];  Service: Endoscopy;  Laterality: N/A;   FOOT SURGERY Right 06/2007   IR RADIOLOGIST EVAL & MGMT  12/14/2019   KNEE ARTHROSCOPY  09/2008   left   KNEE ARTHROSCOPY  april 2011   left   NASAL SINUS SURGERY     NASAL SINUS SURGERY  1990   ORIF ELBOW FRACTURE Right 10/15/2022   Procedure: OPEN  REDUCTION INTERNAL FIXATION (ORIF) OLECRANON FRACTURE;  Surgeon: Ernestina Columbia, MD;  [facility];  Service: Orthopedics;  Laterality: Right;   POLYPECTOMY  03/08/2020   Procedure: POLYPECTOMY;  Surgeon: Willis Modena, MD;  [facility];  Service: Endoscopy;;   SHOULDER SURGERY  2004   left   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    Ht 5\' 8"  (1.727 m)   Wt (!) 384 lb (174.2 kg)   LMP  (LMP Unknown)   BMI 58.39 kg/m   Opioid Risk Score:   Fall Risk Score:  `1  Depression screen [name] 2/9     03/05/2023   11:51 AM 01/03/2023   12:01 PM 11/07/2022    1:20 PM 07/10/2022    1:46 PM 05/14/2022    1:43 PM 03/13/2022    9:22 AM 01/07/2022   11:39 AM  Depression screen PHQ 2/9  Decreased Interest 3 3 1  0 1 2 0  Down, Depressed, Hopeless 1 3 1  0 1 2 3   PHQ - 2 Score 4 6 2  0 2 4 3      Review of Systems  Musculoskeletal:  Positive for back pain.  All other systems reviewed and are negative.     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 Continue to monitor. Marland Kitchen03/15/2024 Refilled: Oxycodone 10/325 mg one tablet every 8 hours as needed #90.  Second script e-scribe for the following month. We will continue the opioid monitoring program, this consists of regular clinic visits, examinations, urine drug screen, pill counts as well as use of [place] Controlled Substance Reporting system. A 12 month History has been reviewed on the [place] Controlled Substance Reporting System on  01/03/2023. 2. Morbid obesity: Continue Healthy Diet Regime and HEP. 03/15//2024 3. Type II diabetes:  Dr. Sharl Ma following. 01/03/2023 4. Reactive Depression: Continue Zoloft. 01/03/2023. 5. Myofascial Muscle Pain: Continue current medication regimen with Soma. 01/03/2023 6. Bilateral  Chronic Knee Pain: Continue HEP as Tolerated. Continue to Monitor. 01/03/2023 7. Right Greater Trochanter Bursitis:  No complaints  today. Continue to alternate Ice and Heat Therapy. Continue to monitor. 01/03/2023     F/U in 2 months

## 2023-03-06 ENCOUNTER — Encounter: Payer: Self-pay | Admitting: Registered Nurse

## 2023-03-06 ENCOUNTER — Ambulatory Visit
Admission: RE | Admit: 2023-03-06 | Discharge: 2023-03-06 | Disposition: A | Discharge status: Home / Self Care | Payer: Medicare HMO | Source: Ambulatory Visit | Attending: Family Medicine | Admitting: Family Medicine

## 2023-03-06 DIAGNOSIS — Z1231 Encounter for screening mammogram for malignant neoplasm of breast: Secondary | ICD-10-CM | POA: Diagnosis not present

## 2023-03-07 ENCOUNTER — Encounter: Payer: Medicare HMO | Admitting: Registered Nurse

## 2023-03-31 DIAGNOSIS — Z466 Encounter for fitting and adjustment of urinary device: Secondary | ICD-10-CM | POA: Diagnosis not present

## 2023-03-31 DIAGNOSIS — Z9359 Other cystostomy status: Secondary | ICD-10-CM | POA: Diagnosis not present

## 2023-04-01 DIAGNOSIS — Z9359 Other cystostomy status: Secondary | ICD-10-CM | POA: Diagnosis not present

## 2023-04-01 DIAGNOSIS — R8279 Other abnormal findings on microbiological examination of urine: Secondary | ICD-10-CM | POA: Diagnosis not present

## 2023-04-15 DIAGNOSIS — H4720 Unspecified optic atrophy: Secondary | ICD-10-CM | POA: Diagnosis not present

## 2023-04-21 DIAGNOSIS — F324 Major depressive disorder, single episode, in partial remission: Secondary | ICD-10-CM | POA: Diagnosis not present

## 2023-04-21 DIAGNOSIS — J019 Acute sinusitis, unspecified: Secondary | ICD-10-CM | POA: Diagnosis not present

## 2023-04-21 DIAGNOSIS — I700 Atherosclerosis of aorta: Secondary | ICD-10-CM | POA: Diagnosis not present

## 2023-05-01 ENCOUNTER — Encounter: Payer: Medicare HMO | Attending: Registered Nurse | Admitting: Registered Nurse

## 2023-05-01 ENCOUNTER — Encounter: Payer: Self-pay | Admitting: Registered Nurse

## 2023-05-01 VITALS — BP 144/85 | HR 94 | Ht 68.0 in | Wt 392.0 lb

## 2023-05-01 DIAGNOSIS — M47816 Spondylosis without myelopathy or radiculopathy, lumbar region: Secondary | ICD-10-CM | POA: Diagnosis not present

## 2023-05-01 DIAGNOSIS — M5416 Radiculopathy, lumbar region: Secondary | ICD-10-CM | POA: Diagnosis not present

## 2023-05-01 DIAGNOSIS — Z79891 Long term (current) use of opiate analgesic: Secondary | ICD-10-CM | POA: Diagnosis not present

## 2023-05-01 DIAGNOSIS — F4320 Adjustment disorder, unspecified: Secondary | ICD-10-CM | POA: Diagnosis not present

## 2023-05-01 DIAGNOSIS — Z5181 Encounter for therapeutic drug level monitoring: Secondary | ICD-10-CM | POA: Insufficient documentation

## 2023-05-01 DIAGNOSIS — M1711 Unilateral primary osteoarthritis, right knee: Secondary | ICD-10-CM | POA: Insufficient documentation

## 2023-05-01 DIAGNOSIS — M7918 Myalgia, other site: Secondary | ICD-10-CM | POA: Diagnosis not present

## 2023-05-01 DIAGNOSIS — M4696 Unspecified inflammatory spondylopathy, lumbar region: Secondary | ICD-10-CM | POA: Diagnosis not present

## 2023-05-01 DIAGNOSIS — M25561 Pain in right knee: Secondary | ICD-10-CM

## 2023-05-01 DIAGNOSIS — M791 Myalgia, unspecified site: Secondary | ICD-10-CM

## 2023-05-01 DIAGNOSIS — Z6841 Body Mass Index (BMI) 40.0 and over, adult: Secondary | ICD-10-CM | POA: Diagnosis not present

## 2023-05-01 DIAGNOSIS — M25562 Pain in left knee: Secondary | ICD-10-CM

## 2023-05-01 DIAGNOSIS — G894 Chronic pain syndrome: Secondary | ICD-10-CM | POA: Insufficient documentation

## 2023-05-01 DIAGNOSIS — M546 Pain in thoracic spine: Secondary | ICD-10-CM | POA: Insufficient documentation

## 2023-05-01 DIAGNOSIS — G8929 Other chronic pain: Secondary | ICD-10-CM | POA: Diagnosis not present

## 2023-05-01 DIAGNOSIS — M1712 Unilateral primary osteoarthritis, left knee: Secondary | ICD-10-CM | POA: Insufficient documentation

## 2023-05-01 MED ORDER — OXYCODONE-ACETAMINOPHEN 10-325 MG PO TABS: 1.0000 | ORAL_TABLET | Freq: Four times a day (QID) | ORAL | 0 refills | Status: DC | PRN

## 2023-05-01 NOTE — Progress Notes (Signed)
Subjective:    Patient ID: Kristen [name], female    DOB: [birth year], 57 y.o.   MRN: [id number]  HPI: Kristen [name] is a 57 y.o. female who returns for follow up appointment for chronic pain and medication refill. She states her pain is located in her mid- lower back and bilateral knee pain. [name] reports at times her back pain has increased in intensity, she would like to speak with Dr Riley Kill regarding a MRI  . She rates her pain 7. Her current exercise regime is walking and performing stretching exercises.  Ms. Dhaliwal Morphine equivalent is 60.00 MME.   Last Oral Swab was Performed on 01/03/2023, it was consistent.    Pain Inventory Average Pain 7 Pain Right Now 7 My pain is sharp, burning, dull, stabbing, and aching  In the last 24 hours, has pain interfered with the following? General activity 8 Relation with others 7 Enjoyment of life 10 What TIME of day is your pain at its worst? morning , daytime, evening, and night Sleep (in general) Poor  Pain is worse with: walking, bending, standing, and some activites Pain improves with: rest, heat/ice, and medication Relief from Meds: 8  Family History  Problem Relation Age of Onset   Hypertension Maternal Grandmother    Stroke Maternal Grandmother    Diabetes Maternal Grandfather    Cancer Mother        lung   Cancer Father    Diabetes Sister    Breast cancer Cousin    Heart attack Neg Hx    Social History   Socioeconomic History   Marital status: Single    Spouse name: Not on file   Number of children: Not on file   Years of education: Not on file   Highest education level: Not on file  Occupational History   Not on file  Tobacco Use   Smoking status: Never   Smokeless tobacco: Never  Vaping Use   Vaping status: Never Used  Substance and Sexual Activity   Alcohol use: No    Alcohol/week: 0.0 standard drinks of alcohol   Drug use: No   Sexual activity: Not Currently    Comment: 1st intercourse  89 yo-1 partner  Other Topics Concern   Not on file  Social History Narrative   Not on file   Social Determinants of Health   Financial Resource Strain: Not on file  Food Insecurity: Not on file  Transportation Needs: Not on file  Physical Activity: Not on file  Stress: Not on file  Social Connections: Not on file   Past Surgical History:  Procedure Laterality Date   ABDOMINAL SURGERY     hernia repair   CHOLECYSTECTOMY  [id number]   COLONOSCOPY WITH PROPOFOL N/A 07/31/2016   Procedure: COLONOSCOPY WITH PROPOFOL;  Surgeon: Willis Modena, MD;  [facility]: [facility];  Service: Endoscopy;  Laterality: N/A;   COLONOSCOPY WITH PROPOFOL N/A 03/08/2020   Procedure: COLONOSCOPY WITH PROPOFOL;  Surgeon: Willis Modena, MD;  [facility]: [facility];  Service: Endoscopy;  Laterality: N/A;   FOOT SURGERY Right 06/2007   IR RADIOLOGIST EVAL & MGMT  12/14/2019   KNEE ARTHROSCOPY  09/2008   left   KNEE ARTHROSCOPY  april 2011   left   NASAL SINUS SURGERY     NASAL SINUS SURGERY  1990   ORIF ELBOW FRACTURE Right 10/15/2022   Procedure: OPEN REDUCTION INTERNAL FIXATION (ORIF) OLECRANON FRACTURE;  Surgeon: Ernestina Columbia, MD;  [facility]: [facility];  Service: Orthopedics;  Laterality: Right;   POLYPECTOMY  03/08/2020   Procedure: POLYPECTOMY;  Surgeon: Willis Modena, MD;  [facility];  Service: Endoscopy;;   SHOULDER SURGERY  2004   left   Past Surgical History:  Procedure Laterality Date   ABDOMINAL SURGERY     hernia repair   CHOLECYSTECTOMY  [id number]   COLONOSCOPY WITH PROPOFOL N/A 07/31/2016   Procedure: COLONOSCOPY WITH PROPOFOL;  Surgeon: Willis Modena, MD;  [facility];  Service: Endoscopy;  Laterality: N/A;   COLONOSCOPY WITH PROPOFOL N/A 03/08/2020   Procedure: COLONOSCOPY WITH PROPOFOL;  Surgeon: Willis Modena, MD;  [facility];  Service: Endoscopy;  Laterality: N/A;   FOOT SURGERY Right 06/2007   IR RADIOLOGIST EVAL & MGMT  12/14/2019   KNEE  ARTHROSCOPY  09/2008   left   KNEE ARTHROSCOPY  april 2011   left   NASAL SINUS SURGERY     NASAL SINUS SURGERY  1990   ORIF ELBOW FRACTURE Right 10/15/2022   Procedure: OPEN REDUCTION INTERNAL FIXATION (ORIF) OLECRANON FRACTURE;  Surgeon: Ernestina Columbia, MD;  [facility];  Service: Orthopedics;  Laterality: Right;   POLYPECTOMY  03/08/2020   Procedure: POLYPECTOMY;  Surgeon: Willis Modena, MD;  [facility];  Service: Endoscopy;;   SHOULDER SURGERY  2004   left   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       Right shoulder pain Right arm pain  All other systems reviewed and are negative.      Objective:   Physical Exam Vitals and nursing note reviewed.  Constitutional:      Appearance: Normal appearance. She is obese.  Cardiovascular:     Rate and Rhythm: Normal rate and regular rhythm.     Pulses: Normal pulses.     Heart sounds: Normal heart sounds.  Pulmonary:     Effort: Pulmonary effort is normal.     Breath  sounds: Normal breath sounds.  Genitourinary:    Comments: Supr Pubic Catheter: Dressing intact: Yellow Drainage Musculoskeletal:     Cervical  back: Normal range of motion and neck supple.     Comments: Normal Muscle Bulk and Muscle Testing Reveals:  Upper Extremities: Full ROM and Muscle Strength 5/5 Thoracic Paraspinal Tenderness: T-7-T-9 Lumbar Paraspinal Tenderness: L-3-l-5 Lower Extremities: Full ROM and Muscle Strength 5/5 Arises from Table Slowly Narrow Based  Gait     Skin:    General: Skin is warm and dry.  Neurological:     Mental Status: She is alert and oriented to person, place, and time.  Psychiatric:        Mood and Affect: Mood normal.        Behavior: Behavior normal.         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 Continue to monitor. .05/01/2023 Refilled: Oxycodone 10/325 mg one tablet every 6 hours as needed #120.  Second script e-scribe for the following month. We will continue the opioid monitoring program, this consists of regular clinic visits, examinations, urine drug screen, pill counts as well as use of [place] Controlled Substance Reporting system. A 12 month History has been reviewed on the [place] Controlled Substance Reporting System on 05/01/2023. 2. Morbid obesity: Continue Healthy Diet Regime and HEP. 07/11//2024 3. Type II diabetes:  Dr. Sharl Ma following. 05/01/2023 4. Reactive Depression: Continue Zoloft. 05/01/2023. 5. Myofascial Muscle Pain: Continue current medication regimen with Soma. 05/01/2023 6. Bilateral  Chronic Knee Pain: Continue HEP as Tolerated. Continue to Monitor. 05/01/2023 7. Right Greater Trochanter Bursitis:  No complaints today. Continue to alternate Ice and Heat Therapy. Continue to monitor. 05/01/2023     F/U in 2 months

## 2023-05-02 ENCOUNTER — Ambulatory Visit: Payer: Medicare HMO | Admitting: Registered Nurse

## 2023-05-12 DIAGNOSIS — Z9359 Other cystostomy status: Secondary | ICD-10-CM | POA: Diagnosis not present

## 2023-06-04 DIAGNOSIS — N939 Abnormal uterine and vaginal bleeding, unspecified: Secondary | ICD-10-CM | POA: Diagnosis not present

## 2023-06-04 DIAGNOSIS — N393 Stress incontinence (female) (male): Secondary | ICD-10-CM | POA: Diagnosis not present

## 2023-06-04 DIAGNOSIS — Z9359 Other cystostomy status: Secondary | ICD-10-CM | POA: Diagnosis not present

## 2023-06-18 ENCOUNTER — Ambulatory Visit: Payer: Medicare HMO | Admitting: Physical Medicine & Rehabilitation

## 2023-06-20 DIAGNOSIS — E1122 Type 2 diabetes mellitus with diabetic chronic kidney disease: Secondary | ICD-10-CM | POA: Diagnosis not present

## 2023-07-09 ENCOUNTER — Encounter: Payer: Self-pay | Admitting: Physical Medicine & Rehabilitation

## 2023-07-09 ENCOUNTER — Encounter: Payer: Medicare HMO | Attending: Registered Nurse | Admitting: Physical Medicine & Rehabilitation

## 2023-07-09 VITALS — BP 107/72 | HR 108 | Wt 384.0 lb

## 2023-07-09 DIAGNOSIS — M47816 Spondylosis without myelopathy or radiculopathy, lumbar region: Secondary | ICD-10-CM | POA: Diagnosis not present

## 2023-07-09 DIAGNOSIS — M546 Pain in thoracic spine: Secondary | ICD-10-CM | POA: Diagnosis not present

## 2023-07-09 MED ORDER — OXYCODONE-ACETAMINOPHEN 10-325 MG PO TABS: 1.0000 | ORAL_TABLET | Freq: Four times a day (QID) | ORAL | 0 refills | Status: DC | PRN

## 2023-07-09 MED ORDER — TIZANIDINE HCL 2 MG PO TABS: 2.0000 mg | ORAL_TABLET | Freq: Three times a day (TID) | ORAL | 2 refills | Status: DC | PRN

## 2023-07-09 NOTE — Progress Notes (Signed)
Subjective:    Patient ID: Kristen [name], female    DOB: [birth year], 57 y.o.   MRN: [id number]  HPI  Kristen [name] is here in follow up of her chronic back pain. She feels that the pain is progressing to where it's higher in her back. She may walk for a few minutes before the pain starts to spread superiorly. She fell last December and broke her elbow which required ORIF. Since then she's not gotten out of the house as much and has developed social anxiety. She is afraid of falling.  When she does go out this typically in the car for drive-through.  Her sister goes to the store to shop for groceries.  She is followed Dr Logan Bores for her urinary incontinence. She wears a chronic foley cath   Pain Inventory Average Pain 7 Pain Right Now 7 My pain is sharp, burning, dull, and stabbing  In the last 24 hours, has pain interfered with the following? General activity 9 Relation with others 9 Enjoyment of life 9 What TIME of day is your pain at its worst? morning , daytime, evening, and night Sleep (in general) Poor  Pain is worse with: walking, bending, standing, and some activites Pain improves with: rest and heat/ice Relief from Meds: 5  Family History  Problem Relation Age of Onset   Hypertension Maternal Grandmother    Stroke Maternal Grandmother    Diabetes Maternal Grandfather    Cancer Mother        lung   Cancer Father    Diabetes Sister    Breast cancer Cousin    Heart attack Neg Hx    Social History   Socioeconomic History   Marital status: Single    Spouse name: Not on file   Number of children: Not on file   Years of education: Not on file   Highest education level: Not on file  Occupational History   Not on file  Tobacco Use   Smoking status: Never   Smokeless tobacco: Never  Vaping Use   Vaping status: Never Used  Substance and Sexual Activity   Alcohol use: No    Alcohol/week: 0.0 standard drinks of alcohol   Drug use: No   Sexual activity: Not  Currently    Comment: 1st intercourse 66 yo-1 partner  Other Topics Concern   Not on file  Social History Narrative   Not on file   Social Determinants of Health   Financial Resource Strain: Not on file  Food Insecurity: Not on file  Transportation Needs: Not on file  Physical Activity: Not on file  Stress: Not on file  Social Connections: Not on file   Past Surgical History:  Procedure Laterality Date   ABDOMINAL SURGERY     hernia repair   CHOLECYSTECTOMY  [id number]   COLONOSCOPY WITH PROPOFOL N/A 07/31/2016   Procedure: COLONOSCOPY WITH PROPOFOL;  Surgeon: Willis Modena, MD;  [facility];  Service: Endoscopy;  Laterality: N/A;   COLONOSCOPY WITH PROPOFOL N/A 03/08/2020   Procedure: COLONOSCOPY WITH PROPOFOL;  Surgeon: Willis Modena, MD;  [facility];  Service: Endoscopy;  Laterality: N/A;   FOOT SURGERY Right 06/2007   IR RADIOLOGIST EVAL & MGMT  12/14/2019   KNEE ARTHROSCOPY  09/2008   left   KNEE ARTHROSCOPY  april 2011   left   NASAL SINUS SURGERY     NASAL SINUS SURGERY  1990   ORIF ELBOW FRACTURE Right 10/15/2022   Procedure: OPEN REDUCTION INTERNAL FIXATION (  ORIF) OLECRANON FRACTURE;  Surgeon: Ernestina Columbia, MD;  Location: [name] OR;  Service: Orthopedics;  Laterality: Right;   POLYPECTOMY  03/08/2020   Procedure: POLYPECTOMY;  Surgeon: Willis Modena, MD;  [facility];  Service: Endoscopy;;   SHOULDER SURGERY  2004   left   Past Surgical History:  Procedure Laterality Date   ABDOMINAL SURGERY     hernia repair   CHOLECYSTECTOMY  [id number]   COLONOSCOPY WITH PROPOFOL N/A 07/31/2016   Procedure: COLONOSCOPY WITH PROPOFOL;  Surgeon: Willis Modena, MD;  [facility];  Service: Endoscopy;  Laterality: N/A;   COLONOSCOPY WITH PROPOFOL N/A 03/08/2020   Procedure: COLONOSCOPY WITH PROPOFOL;  Surgeon: Willis Modena, MD;  [facility];  Service: Endoscopy;  Laterality: N/A;   FOOT SURGERY Right 06/2007   IR  RADIOLOGIST EVAL & MGMT  12/14/2019   KNEE ARTHROSCOPY  09/2008   left   KNEE ARTHROSCOPY  april 2011   left   NASAL SINUS SURGERY     NASAL SINUS SURGERY  1990   ORIF ELBOW FRACTURE Right 10/15/2022   Procedure: OPEN REDUCTION INTERNAL FIXATION (ORIF) OLECRANON FRACTURE;  Surgeon: Ernestina Columbia, MD;  Location[facility];  Service: Orthopedics;  Laterality: Right;   POLYPECTOMY  03/08/2020   Procedure: POLYPECTOMY;  Surgeon: Willis Modena, MD;  [facility];  Service: Endoscopy;;   SHOULDER SURGERY  2004   left   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   HENT: Negative.    Eyes: Negative.   Respiratory: Negative.    Cardiovascular: Negative.   Gastrointestinal: Negative.   Endocrine: Negative.   Genitourinary:        Foley  Musculoskeletal:  Positive for back pain.  Skin: Negative.   Allergic/Immunologic: Negative.   Neurological: Negative.   Hematological:  Bruises/bleeds easily.       Eliquis  Psychiatric/Behavioral: Negative.    All other systems reviewed and are  negative.      Objective:   Physical Exam  General: No acute distress, obese HEENT: NCAT, EOMI, oral membranes moist Cards: reg rate  Chest: normal effort Abdomen: Soft, NT, ND Skin: dry,  intact Extremities: Chronic lower extremity edema and vascular changes Psych: pleasant and appropriate as always Uro: Foley catheter in place Musculoskeletal: LLE tr to 1+ edema persistent Low back remains tender along the belt line bilaterally.  Pain worse with extension and minimal with flexion.  There was some pain with palpation over the lumbar and thoracic paraspinals right more than left..  Left knee with mild crepitus and edema.  Gait remains somewhat antalgic and wide-based.  She favors the left side more in the right.. Neuro: alert and oriented to person, place, and time.  Motor 5 out of 5.  Decreased sensation distally in both legs.     .             Assessment & Plan:  1.Spondylosis  Of Lumbar Region/ Lumbar Facet arthropathy:      -thoracic pain is likely referral from low back and muscle spasm. Also she's not moving as much and there is an element of muscle fatigue.  We discussed the importance of increasing activity and working on appropriate posture or standing in general.  I was going to provide her a packet of core muscle exercises but it really was probably too complicated for her to use.      -Will order lumbar and thoracic plain films to look for any major vertebra or disc pathology.  I do not feel that MRI or CT is indicated at this point as we are unlikely to pursue any disc or facet pathology in her thoracic spine.  Her low back really has not seemed to progress significantly either.     -Added tizanidine 2 mg every 8 hours as needed for muscle spasms and referred pain.  She can use this in addition to the [place] which she essentially is taking scheduled now.             -refilled oxycodone 10 mg one tablet every 6 hours as needed, #120.                -We will continue the  controlled substance monitoring program, this consists of regular clinic visits, examinations, routine drug screening, pill counts as well as use of [place] Controlled Substance Reporting System. [place] today.      Medication was refilled and a second prescription was sent to the patient's pharmacy for next month.   2. Morbid obesity: Continue Healthy Diet Regime and HEP             -remains an issue.  3. Type II diabetes:  Dr. Sharl Ma following             -continue per endocrine fecs.  4. Reactive depression:                 -continue on zoloft 200mg  daily              -seems to be in a better place overall 5. Recent PE: on eliquis per primary             - 6. Left knee pain, likely OA              15 minutes of face to face patient care time was spent during this visit. All questions were encouraged and answered. Follow up with NP in 2 months. Marland Kitchen

## 2023-07-09 NOTE — Patient Instructions (Signed)
ALWAYS FEEL FREE TO CALL OUR OFFICE WITH ANY PROBLEMS OR QUESTIONS ([phone])  **PLEASE NOTE** ALL MEDICATION REFILL REQUESTS (INCLUDING CONTROLLED SUBSTANCES) NEED TO BE MADE AT LEAST 7 DAYS PRIOR TO REFILL BEING DUE. ANY REFILL REQUESTS INSIDE THAT TIME FRAME MAY RESULT IN DELAYS IN RECEIVING YOUR PRESCRIPTION.                    

## 2023-07-10 ENCOUNTER — Telehealth: Payer: Self-pay | Admitting: Registered Nurse

## 2023-07-10 ENCOUNTER — Other Ambulatory Visit: Payer: Self-pay | Admitting: Registered Nurse

## 2023-07-10 MED ORDER — CARISOPRODOL 350 MG PO TABS: 350.0000 mg | ORAL_TABLET | Freq: Three times a day (TID) | ORAL | 1 refills | Status: DC

## 2023-07-10 NOTE — Telephone Encounter (Signed)
PMP was Reviewed.  She has a refill on her SOMA, she was asked to call [facility] regarding the refill via My-Chart message.

## 2023-07-10 NOTE — Telephone Encounter (Signed)
PMP was Reviewed.  Soma e-scribed today.  Ms. Beaulieu is aware via My- Chart

## 2023-07-11 ENCOUNTER — Ambulatory Visit: Payer: Medicare HMO | Admitting: Registered Nurse

## 2023-07-11 ENCOUNTER — Telehealth: Payer: Self-pay | Admitting: Registered Nurse

## 2023-07-11 MED ORDER — CARISOPRODOL 350 MG PO TABS: 350.0000 mg | ORAL_TABLET | Freq: Three times a day (TID) | ORAL | 1 refills | Status: DC

## 2023-07-11 NOTE — Telephone Encounter (Signed)
PMP was Reviewed.  Soma e-scribed to [facility] per patient request. Ms. Coppes is aware via My-Chart message.

## 2023-07-15 ENCOUNTER — Telehealth: Payer: Self-pay

## 2023-07-15 ENCOUNTER — Telehealth: Payer: Self-pay | Admitting: Registered Nurse

## 2023-07-15 NOTE — Telephone Encounter (Signed)
Phamacy called to question Kristen [name] Tizanidine & Carisoprodol medication:   Per Dr. Riley Kill note to 07/09/2023:             -Added tizanidine 2 mg every 8 hours as needed for muscle spasms and referred pain.  She can use this in addition to the [place] which she essentially is taking scheduled   Information shared with Pharmacist. .

## 2023-07-16 ENCOUNTER — Telehealth: Payer: Self-pay | Admitting: *Deleted

## 2023-07-16 DIAGNOSIS — Z9359 Other cystostomy status: Secondary | ICD-10-CM | POA: Diagnosis not present

## 2023-07-16 DIAGNOSIS — Z466 Encounter for fitting and adjustment of urinary device: Secondary | ICD-10-CM | POA: Diagnosis not present

## 2023-07-16 NOTE — Telephone Encounter (Signed)
Kristen [name] called because Dr Riley Kill sent in her Rx  for oxycodone-acetaminophen this month #60 instead of #120. (Next monht rx is correct for #120. She has already picked the 60 up so I have told her to call back in about 2 weeks when she needs refill and Riley Lam will have to write for her next #60 tablets.

## 2023-07-31 ENCOUNTER — Telehealth: Payer: Self-pay | Admitting: Registered Nurse

## 2023-07-31 ENCOUNTER — Other Ambulatory Visit: Payer: Self-pay | Admitting: Physical Medicine & Rehabilitation

## 2023-07-31 DIAGNOSIS — M546 Pain in thoracic spine: Secondary | ICD-10-CM

## 2023-07-31 DIAGNOSIS — M47816 Spondylosis without myelopathy or radiculopathy, lumbar region: Secondary | ICD-10-CM

## 2023-07-31 MED ORDER — OXYCODONE-ACETAMINOPHEN 10-325 MG PO TABS: 1.0000 | ORAL_TABLET | Freq: Four times a day (QID) | ORAL | 0 refills | Status: DC | PRN

## 2023-07-31 NOTE — Telephone Encounter (Signed)
PMP was Reviewed.  Oxycodone e-scribed to pharmacy.  Kristen [name] is awre via My-Chart message.

## 2023-07-31 NOTE — Telephone Encounter (Signed)
Per previous note patient received 60 instead of 120 oxycodone 2 weeks ago. She needs a new prescription for the rest of the prescription. She will be out of medication tomorrow.

## 2023-08-04 ENCOUNTER — Ambulatory Visit
Admission: RE | Admit: 2023-08-04 | Discharge: 2023-08-04 | Disposition: A | Discharge status: Home / Self Care | Payer: Medicare HMO | Source: Ambulatory Visit | Attending: Physical Medicine & Rehabilitation | Admitting: Physical Medicine & Rehabilitation

## 2023-08-04 ENCOUNTER — Other Ambulatory Visit: Payer: Self-pay | Admitting: Physical Medicine & Rehabilitation

## 2023-08-04 DIAGNOSIS — M546 Pain in thoracic spine: Secondary | ICD-10-CM

## 2023-08-04 DIAGNOSIS — M47816 Spondylosis without myelopathy or radiculopathy, lumbar region: Secondary | ICD-10-CM

## 2023-08-04 DIAGNOSIS — M545 Low back pain, unspecified: Secondary | ICD-10-CM | POA: Diagnosis not present

## 2023-08-04 DIAGNOSIS — M549 Dorsalgia, unspecified: Secondary | ICD-10-CM | POA: Diagnosis not present

## 2023-08-07 DIAGNOSIS — M546 Pain in thoracic spine: Secondary | ICD-10-CM

## 2023-08-07 DIAGNOSIS — M47816 Spondylosis without myelopathy or radiculopathy, lumbar region: Secondary | ICD-10-CM

## 2023-08-08 DIAGNOSIS — Z23 Encounter for immunization: Secondary | ICD-10-CM | POA: Diagnosis not present

## 2023-08-08 DIAGNOSIS — F324 Major depressive disorder, single episode, in partial remission: Secondary | ICD-10-CM | POA: Diagnosis not present

## 2023-08-08 DIAGNOSIS — R0609 Other forms of dyspnea: Secondary | ICD-10-CM | POA: Diagnosis not present

## 2023-08-08 DIAGNOSIS — Z993 Dependence on wheelchair: Secondary | ICD-10-CM | POA: Diagnosis not present

## 2023-08-08 DIAGNOSIS — Z604 Social exclusion and rejection: Secondary | ICD-10-CM | POA: Diagnosis not present

## 2023-08-08 DIAGNOSIS — B379 Candidiasis, unspecified: Secondary | ICD-10-CM | POA: Diagnosis not present

## 2023-08-15 MED ORDER — TIZANIDINE HCL 2 MG PO TABS
2.0000 mg | ORAL_TABLET | Freq: Three times a day (TID) | ORAL | 3 refills | Status: DC
Start: 2023-08-15 — End: 2024-06-14

## 2023-08-15 MED ORDER — CARISOPRODOL 350 MG PO TABS: 350.0000 mg | ORAL_TABLET | Freq: Three times a day (TID) | ORAL | Status: DC | PRN

## 2023-08-15 NOTE — Telephone Encounter (Signed)
I spoke with Kristen [name] on phone. I adjusted tizanidine to 2mg  q8. Changed soma to prn. I would like to bring her in next week for a trigger point injection 08/20/23 at 3pm.  Please cancel appt with [facility]. Pt is aware of new appt/time but it would be good to just call and confirm with her.  Thanks!

## 2023-08-19 ENCOUNTER — Encounter: Payer: Medicare HMO | Admitting: Registered Nurse

## 2023-08-20 ENCOUNTER — Encounter: Payer: Self-pay | Admitting: Physical Medicine & Rehabilitation

## 2023-08-20 ENCOUNTER — Encounter: Payer: Medicare HMO | Attending: Registered Nurse | Admitting: Physical Medicine & Rehabilitation

## 2023-08-20 VITALS — BP 144/88 | HR 110 | Ht 68.0 in | Wt 386.0 lb

## 2023-08-20 DIAGNOSIS — M47816 Spondylosis without myelopathy or radiculopathy, lumbar region: Secondary | ICD-10-CM

## 2023-08-20 DIAGNOSIS — M546 Pain in thoracic spine: Secondary | ICD-10-CM | POA: Diagnosis not present

## 2023-08-20 DIAGNOSIS — M7918 Myalgia, other site: Secondary | ICD-10-CM | POA: Diagnosis not present

## 2023-08-20 MED ORDER — LIDOCAINE HCL 1 % IJ SOLN
2.0000 mL | Freq: Once | INTRAMUSCULAR | Status: AC
Start: 2023-08-20 — End: 2023-08-20
  Administered 2023-08-20: 6 mL

## 2023-08-20 MED ORDER — OXYCODONE-ACETAMINOPHEN 10-325 MG PO TABS: 1.0000 | ORAL_TABLET | Freq: Four times a day (QID) | ORAL | 0 refills | Status: DC | PRN

## 2023-08-20 NOTE — Patient Instructions (Signed)
ALWAYS FEEL FREE TO CALL OUR OFFICE WITH ANY PROBLEMS OR QUESTIONS ([phone])  **PLEASE NOTE** ALL MEDICATION REFILL REQUESTS (INCLUDING CONTROLLED SUBSTANCES) NEED TO BE MADE AT LEAST 7 DAYS PRIOR TO REFILL BEING DUE. ANY REFILL REQUESTS INSIDE THAT TIME FRAME MAY RESULT IN DELAYS IN RECEIVING YOUR PRESCRIPTION.                    

## 2023-08-20 NOTE — Progress Notes (Signed)
Subjective:    Patient ID: Kristen [name], female    DOB: [birth year], 57 y.o.   MRN: [id number]  HPI  Patient here for trigger point injections today.  See procedure below. Pain Inventory Average Pain 7 Pain Right Now 9 My pain is constant, sharp, burning, dull, stabbing, and aching  In the last 24 hours, has pain interfered with the following? General activity 10 Relation with others 10 Enjoyment of life 10 What TIME of day is your pain at its worst? morning , daytime, evening, and night Sleep (in general) Poor  Pain is worse with: walking, bending, sitting, inactivity, standing, and some activites Pain improves with: rest, heat/ice, and medication Relief from Meds: 7  Family History  Problem Relation Age of Onset   Hypertension Maternal Grandmother    Stroke Maternal Grandmother    Diabetes Maternal Grandfather    Cancer Mother        lung   Cancer Father    Diabetes Sister    Breast cancer Cousin    Heart attack Neg Hx    Social History   Socioeconomic History   Marital status: Single    Spouse name: Not on file   Number of children: Not on file   Years of education: Not on file   Highest education level: Not on file  Occupational History   Not on file  Tobacco Use   Smoking status: Never   Smokeless tobacco: Never  Vaping Use   Vaping status: Never Used  Substance and Sexual Activity   Alcohol use: No    Alcohol/week: 0.0 standard drinks of alcohol   Drug use: No   Sexual activity: Not Currently    Comment: 1st intercourse 15 yo-1 partner  Other Topics Concern   Not on file  Social History Narrative   Not on file   Social Determinants of Health   Financial Resource Strain: Not on file  Food Insecurity: Not on file  Transportation Needs: Not on file  Physical Activity: Not on file  Stress: Not on file  Social Connections: Not on file   Past Surgical History:  Procedure Laterality Date   ABDOMINAL SURGERY     hernia repair    CHOLECYSTECTOMY  [id number]   COLONOSCOPY WITH PROPOFOL N/A 07/31/2016   Procedure: COLONOSCOPY WITH PROPOFOL;  Surgeon: Willis Modena, MD;  [facility];  Service: Endoscopy;  Laterality: N/A;   COLONOSCOPY WITH PROPOFOL N/A 03/08/2020   Procedure: COLONOSCOPY WITH PROPOFOL;  Surgeon: Willis Modena, MD;  [facility];  Service: Endoscopy;  Laterality: N/A;   FOOT SURGERY Right 06/2007   IR RADIOLOGIST EVAL & MGMT  12/14/2019   KNEE ARTHROSCOPY  09/2008   left   KNEE ARTHROSCOPY  april 2011   left   NASAL SINUS SURGERY     NASAL SINUS SURGERY  1990   ORIF ELBOW FRACTURE Right 10/15/2022   Procedure: OPEN REDUCTION INTERNAL FIXATION (ORIF) OLECRANON FRACTURE;  Surgeon: Ernestina Columbia, MD;  [facility];  Service: Orthopedics;  Laterality: Right;   POLYPECTOMY  03/08/2020   Procedure: POLYPECTOMY;  Surgeon: Willis Modena, MD;  [facility];  Service: Endoscopy;;   SHOULDER SURGERY  2004   left   Past Surgical History:  Procedure Laterality Date   ABDOMINAL SURGERY     hernia repair   CHOLECYSTECTOMY  [id number]   COLONOSCOPY WITH PROPOFOL N/A 07/31/2016   Procedure: COLONOSCOPY WITH PROPOFOL;  Surgeon: Willis Modena, MD;  [facility];  Service: Endoscopy;  Laterality: N/A;   COLONOSCOPY WITH PROPOFOL N/A 03/08/2020   Procedure: COLONOSCOPY WITH PROPOFOL;  Surgeon: Willis Modena, MD;  [facility];  Service: Endoscopy;  Laterality: N/A;   FOOT SURGERY Right 06/2007   IR RADIOLOGIST EVAL & MGMT  12/14/2019   KNEE ARTHROSCOPY  09/2008   left   KNEE ARTHROSCOPY  april 2011   left   NASAL SINUS SURGERY     NASAL SINUS SURGERY  1990   ORIF ELBOW FRACTURE Right 10/15/2022   Procedure: OPEN REDUCTION INTERNAL FIXATION (ORIF) OLECRANON FRACTURE;  Surgeon: Ernestina Columbia, MD;  [facility];  Service: Orthopedics;  Laterality: Right;   POLYPECTOMY  03/08/2020   Procedure: POLYPECTOMY;  Surgeon: Willis Modena, MD;  [facility];  Service: Endoscopy;;   SHOULDER SURGERY  2004   left   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  All other systems reviewed and are negative.     Objective:   Physical Exam Trigger points along right thoracic paraspinals  at t6-t8      Assessment & Plan:  TPI:  A trigger point injection after prep with isopropyl alcohol was performed at the site of maximal tenderness using 1%  Lidocaine at the following locations:  3 thoracic paraspinal muscles on right This was well tolerated, and followed by  relief of pain.

## 2023-09-05 ENCOUNTER — Ambulatory Visit: Payer: Medicare HMO | Admitting: Registered Nurse

## 2023-09-22 DIAGNOSIS — J45991 Cough variant asthma: Secondary | ICD-10-CM | POA: Diagnosis not present

## 2023-09-22 DIAGNOSIS — E7800 Pure hypercholesterolemia, unspecified: Secondary | ICD-10-CM | POA: Diagnosis not present

## 2023-09-22 DIAGNOSIS — E1122 Type 2 diabetes mellitus with diabetic chronic kidney disease: Secondary | ICD-10-CM | POA: Diagnosis not present

## 2023-09-22 DIAGNOSIS — Z9359 Other cystostomy status: Secondary | ICD-10-CM | POA: Diagnosis not present

## 2023-09-22 DIAGNOSIS — Z8639 Personal history of other endocrine, nutritional and metabolic disease: Secondary | ICD-10-CM | POA: Diagnosis not present

## 2023-09-22 DIAGNOSIS — Z0000 Encounter for general adult medical examination without abnormal findings: Secondary | ICD-10-CM | POA: Diagnosis not present

## 2023-09-22 DIAGNOSIS — Z1331 Encounter for screening for depression: Secondary | ICD-10-CM | POA: Diagnosis not present

## 2023-09-22 DIAGNOSIS — D649 Anemia, unspecified: Secondary | ICD-10-CM | POA: Diagnosis not present

## 2023-09-22 DIAGNOSIS — I10 Essential (primary) hypertension: Secondary | ICD-10-CM | POA: Diagnosis not present

## 2023-09-23 ENCOUNTER — Telehealth: Payer: Self-pay

## 2023-09-23 DIAGNOSIS — M546 Pain in thoracic spine: Secondary | ICD-10-CM

## 2023-09-23 DIAGNOSIS — M47816 Spondylosis without myelopathy or radiculopathy, lumbar region: Secondary | ICD-10-CM

## 2023-09-23 MED ORDER — OXYCODONE-ACETAMINOPHEN 10-325 MG PO TABS: 1.0000 | ORAL_TABLET | Freq: Four times a day (QID) | ORAL | 0 refills | Status: DC | PRN

## 2023-09-23 NOTE — Telephone Encounter (Signed)
Patient requesting refill on oxycodone °

## 2023-09-23 NOTE — Telephone Encounter (Signed)
Rx written and sent to the pharmacy. Thanks!

## 2023-09-29 DIAGNOSIS — N3289 Other specified disorders of bladder: Secondary | ICD-10-CM | POA: Diagnosis not present

## 2023-09-29 DIAGNOSIS — Z9359 Other cystostomy status: Secondary | ICD-10-CM | POA: Diagnosis not present

## 2023-10-16 DIAGNOSIS — E1122 Type 2 diabetes mellitus with diabetic chronic kidney disease: Secondary | ICD-10-CM | POA: Diagnosis not present

## 2023-10-16 DIAGNOSIS — I10 Essential (primary) hypertension: Secondary | ICD-10-CM | POA: Diagnosis not present

## 2023-10-16 DIAGNOSIS — E1165 Type 2 diabetes mellitus with hyperglycemia: Secondary | ICD-10-CM | POA: Diagnosis not present

## 2023-10-16 DIAGNOSIS — Z794 Long term (current) use of insulin: Secondary | ICD-10-CM | POA: Diagnosis not present

## 2023-10-16 DIAGNOSIS — R21 Rash and other nonspecific skin eruption: Secondary | ICD-10-CM | POA: Diagnosis not present

## 2023-10-16 DIAGNOSIS — E113293 Type 2 diabetes mellitus with mild nonproliferative diabetic retinopathy without macular edema, bilateral: Secondary | ICD-10-CM | POA: Diagnosis not present

## 2023-10-20 ENCOUNTER — Encounter: Payer: Medicare HMO | Attending: Registered Nurse | Admitting: Registered Nurse

## 2023-10-20 VITALS — BP 151/83 | HR 93 | Ht 68.0 in | Wt 386.0 lb

## 2023-10-20 DIAGNOSIS — M1711 Unilateral primary osteoarthritis, right knee: Secondary | ICD-10-CM | POA: Insufficient documentation

## 2023-10-20 DIAGNOSIS — M7918 Myalgia, other site: Secondary | ICD-10-CM | POA: Diagnosis not present

## 2023-10-20 DIAGNOSIS — M546 Pain in thoracic spine: Secondary | ICD-10-CM | POA: Insufficient documentation

## 2023-10-20 DIAGNOSIS — G894 Chronic pain syndrome: Secondary | ICD-10-CM | POA: Insufficient documentation

## 2023-10-20 DIAGNOSIS — M47816 Spondylosis without myelopathy or radiculopathy, lumbar region: Secondary | ICD-10-CM | POA: Diagnosis not present

## 2023-10-20 DIAGNOSIS — Z79891 Long term (current) use of opiate analgesic: Secondary | ICD-10-CM | POA: Insufficient documentation

## 2023-10-20 DIAGNOSIS — Z5181 Encounter for therapeutic drug level monitoring: Secondary | ICD-10-CM | POA: Insufficient documentation

## 2023-10-20 DIAGNOSIS — M1712 Unilateral primary osteoarthritis, left knee: Secondary | ICD-10-CM | POA: Insufficient documentation

## 2023-10-20 MED ORDER — OXYCODONE-ACETAMINOPHEN 10-325 MG PO TABS: 1.0000 | ORAL_TABLET | Freq: Four times a day (QID) | ORAL | 0 refills | Status: DC | PRN

## 2023-10-20 NOTE — Progress Notes (Unsigned)
Subjective:    Patient ID: Kristen [name], female    DOB: Feb 17, 1966, 57 y.o.   MRN: [id number]  HPI: Kristen [name] is a 57 y.o. female who returns for follow up appointment for chronic pain and medication refill. states *** pain is located in  ***. rates pain ***. current exercise regime is walking and performing stretching exercises.  [name] Morphine equivalent is *** MME.   Oral Swab was Performed today.     Pain Inventory Average Pain 7 Pain Right Now 8 My pain is constant, sharp, burning, dull, stabbing, and aching  In the last 24 hours, has pain interfered with the following? General activity 8 Relation with others 9 Enjoyment of life 10 What TIME of day is your pain at its worst? morning , daytime, evening, and night Sleep (in general) Poor  Pain is worse with: walking, bending, standing, and some activites Pain improves with: rest, heat/ice, and medication Relief from Meds: 7  Family History  Problem Relation Age of Onset   Hypertension Maternal Grandmother    Stroke Maternal Grandmother    Diabetes Maternal Grandfather    Cancer Mother        lung   Cancer Father    Diabetes Sister    Breast cancer Cousin    Heart attack Neg Hx    Social History   Socioeconomic History   Marital status: Single    Spouse name: Not on file   Number of children: Not on file   Years of education: Not on file   Highest education level: Not on file  Occupational History   Not on file  Tobacco Use   Smoking status: Never   Smokeless tobacco: Never  Vaping Use   Vaping status: Never Used  Substance and Sexual Activity   Alcohol use: No    Alcohol/week: 0.0 standard drinks of alcohol   Drug use: No   Sexual activity: Not Currently    Comment: 1st intercourse 74 yo-1 partner  Other Topics Concern   Not on file  Social History Narrative   Not on file   Social Drivers of Health   Financial Resource Strain: Not on file  Food Insecurity: Not on file   Transportation Needs: Not on file  Physical Activity: Not on file  Stress: Not on file  Social Connections: Not on file   Past Surgical History:  Procedure Laterality Date   ABDOMINAL SURGERY     hernia repair   CHOLECYSTECTOMY  [id number]   COLONOSCOPY WITH PROPOFOL N/A 07/31/2016   Procedure: COLONOSCOPY WITH PROPOFOL;  Surgeon: Willis Modena, MD;  [facility];  Service: Endoscopy;  Laterality: N/A;   COLONOSCOPY WITH PROPOFOL N/A 03/08/2020   Procedure: COLONOSCOPY WITH PROPOFOL;  Surgeon: Willis Modena, MD;  [facility];  Service: Endoscopy;  Laterality: N/A;   FOOT SURGERY Right 06/2007   IR RADIOLOGIST EVAL & MGMT  12/14/2019   KNEE ARTHROSCOPY  09/2008   left   KNEE ARTHROSCOPY  april 2011   left   NASAL SINUS SURGERY     NASAL SINUS SURGERY  1990   ORIF ELBOW FRACTURE Right 10/15/2022   Procedure: OPEN REDUCTION INTERNAL FIXATION (ORIF) OLECRANON FRACTURE;  Surgeon: Ernestina Columbia, MD;  [facility];  Service: Orthopedics;  Laterality: Right;   POLYPECTOMY  03/08/2020   Procedure: POLYPECTOMY;  Surgeon: Willis Modena, MD;  [facility];  Service: Endoscopy;;   SHOULDER SURGERY  2004   left   Past Surgical History:  Procedure Laterality Date   ABDOMINAL SURGERY     hernia repair   CHOLECYSTECTOMY  [id number]   COLONOSCOPY WITH PROPOFOL N/A 07/31/2016   Procedure: COLONOSCOPY WITH PROPOFOL;  Surgeon: Willis Modena, MD;  [facility];  Service: Endoscopy;  Laterality: N/A;   COLONOSCOPY WITH PROPOFOL N/A 03/08/2020   Procedure: COLONOSCOPY WITH PROPOFOL;  Surgeon: Willis Modena, MD;  [facility];  Service: Endoscopy;  Laterality: N/A;   FOOT SURGERY Right 06/2007   IR RADIOLOGIST EVAL & MGMT  12/14/2019   KNEE ARTHROSCOPY  09/2008   left   KNEE ARTHROSCOPY  april 2011   left   NASAL SINUS SURGERY     NASAL SINUS SURGERY  1990   ORIF ELBOW FRACTURE Right 10/15/2022   Procedure: OPEN REDUCTION INTERNAL FIXATION  (ORIF) OLECRANON FRACTURE;  Surgeon: Ernestina Columbia, MD;  [facility];  Service: Orthopedics;  Laterality: Right;   POLYPECTOMY  03/08/2020   Procedure: POLYPECTOMY;  Surgeon: Willis Modena, MD;  [facility];  Service: Endoscopy;;   SHOULDER SURGERY  2004   left   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       Bilateral knee pain  All other systems reviewed and are negative.     Objective:   Physical Exam        Assessment & Plan:

## 2023-10-24 LAB — DRUG TOX MONITOR 1 W/CONF, ORAL FLD
Amphetamines: NEGATIVE ng/mL (ref <10)
Barbiturates: NEGATIVE ng/mL (ref <10)
Benzodiazepines: NEGATIVE ng/mL (ref <0.50)
Buprenorphine: NEGATIVE ng/mL (ref <0.10)
Cocaine: NEGATIVE ng/mL (ref <5.0)
Codeine: NEGATIVE ng/mL (ref <2.5)
Dihydrocodeine: NEGATIVE ng/mL (ref <2.5)
Fentanyl: NEGATIVE ng/mL (ref <0.10)
Heroin Metabolite: NEGATIVE ng/mL (ref <1.0)
Hydrocodone: NEGATIVE ng/mL (ref <2.5)
Hydromorphone: NEGATIVE ng/mL (ref <2.5)
MARIJUANA: NEGATIVE ng/mL (ref <2.5)
MDMA: NEGATIVE ng/mL (ref <10)
Meprobamate: NEGATIVE ng/mL (ref <2.5)
Methadone: NEGATIVE ng/mL (ref <5.0)
Morphine: NEGATIVE ng/mL (ref <2.5)
Nicotine Metabolite: NEGATIVE ng/mL (ref <5.0)
Norhydrocodone: NEGATIVE ng/mL (ref <2.5)
Noroxycodone: 63.3 ng/mL — ABNORMAL HIGH (ref <2.5)
Opiates: POSITIVE ng/mL — AB (ref <2.5)
Oxycodone: 246.1 ng/mL — ABNORMAL HIGH (ref <2.5)
Oxymorphone: NEGATIVE ng/mL (ref <2.5)
Phencyclidine: NEGATIVE ng/mL (ref <10)
Tapentadol: NEGATIVE ng/mL (ref <5.0)
Tramadol: NEGATIVE ng/mL (ref <5.0)
Zolpidem: NEGATIVE ng/mL (ref <5.0)

## 2023-10-24 LAB — DRUG TOX ALC METAB W/CON, ORAL FLD: Alcohol Metabolite: NEGATIVE ng/mL (ref <25)

## 2023-12-03 DIAGNOSIS — Z9359 Other cystostomy status: Secondary | ICD-10-CM | POA: Diagnosis not present

## 2023-12-03 DIAGNOSIS — N3289 Other specified disorders of bladder: Secondary | ICD-10-CM | POA: Diagnosis not present

## 2023-12-08 DIAGNOSIS — Z9359 Other cystostomy status: Secondary | ICD-10-CM | POA: Diagnosis not present

## 2023-12-16 DIAGNOSIS — J45991 Cough variant asthma: Secondary | ICD-10-CM | POA: Diagnosis not present

## 2023-12-16 DIAGNOSIS — J019 Acute sinusitis, unspecified: Secondary | ICD-10-CM | POA: Diagnosis not present

## 2023-12-16 DIAGNOSIS — R110 Nausea: Secondary | ICD-10-CM | POA: Diagnosis not present

## 2023-12-16 NOTE — Progress Notes (Signed)
 Subjective:    Patient ID: Kristen [name], female    DOB: Aug 12, 1966, 58 y.o.   MRN: [id number]  HPI: Kristen [name] is a 58 y.o. female who returns for follow up appointment for chronic pain and medication refill. She states her pain is located in her lower back. She rates her pain 8. Her current exercise regime is walking and performing stretching exercises.   [name] Morphine equivalent is 60.00 MME.   Oral Swab was Performed on 10/20/2023, it was consistent.      Pain Inventory Average Pain 7 Pain Right Now 8 My pain is sharp, burning, dull, stabbing, tingling, and aching  In the last 24 hours, has pain interfered with the following? General activity 5 Relation with others 9 Enjoyment of life 9 What TIME of day is your pain at its worst? morning , daytime, evening, and night Sleep (in general) Poor  Pain is worse with: walking, bending, sitting, inactivity, standing, and some activites Pain improves with: rest, heat/ice, medication, and exercise Relief from Meds: 6  Family History  Problem Relation Age of Onset   Hypertension Maternal Grandmother    Stroke Maternal Grandmother    Diabetes Maternal Grandfather    Cancer Mother        lung   Cancer Father    Diabetes Sister    Breast cancer Cousin    Heart attack Neg Hx    Social History   Socioeconomic History   Marital status: Single    Spouse name: Not on file   Number of children: Not on file   Years of education: Not on file   Highest education level: Not on file  Occupational History   Not on file  Tobacco Use   Smoking status: Never   Smokeless tobacco: Never  Vaping Use   Vaping status: Never Used  Substance and Sexual Activity   Alcohol use: No    Alcohol/week: 0.0 standard drinks of alcohol   Drug use: No   Sexual activity: Not Currently    Comment: 1st intercourse 9 yo-1 partner  Other Topics Concern   Not on file  Social History Narrative   Not on file   Social Drivers of  Health   Financial Resource Strain: Not on file  Food Insecurity: Not on file  Transportation Needs: Not on file  Physical Activity: Not on file  Stress: Not on file  Social Connections: Not on file   Past Surgical History:  Procedure Laterality Date   ABDOMINAL SURGERY     hernia repair   CHOLECYSTECTOMY  [id number]   COLONOSCOPY WITH PROPOFOL N/A 07/31/2016   Procedure: COLONOSCOPY WITH PROPOFOL;  Surgeon: Kristen Modena, MD;  [facility];  Service: Endoscopy;  Laterality: N/A;   COLONOSCOPY WITH PROPOFOL N/A 03/08/2020   Procedure: COLONOSCOPY WITH PROPOFOL;  Surgeon: Kristen Modena, MD;  [facility];  Service: Endoscopy;  Laterality: N/A;   FOOT SURGERY Right 06/2007   IR RADIOLOGIST EVAL & MGMT  12/14/2019   KNEE ARTHROSCOPY  09/2008   left   KNEE ARTHROSCOPY  april 2011   left   NASAL SINUS SURGERY     NASAL SINUS SURGERY  1990   ORIF ELBOW FRACTURE Right 10/15/2022   Procedure: OPEN REDUCTION INTERNAL FIXATION (ORIF) OLECRANON FRACTURE;  Surgeon: Kristen Columbia, MD;  [facility];  Service: Orthopedics;  Laterality: Right;   POLYPECTOMY  03/08/2020   Procedure: POLYPECTOMY;  Surgeon: Kristen Modena, MD;  [facility];  Service: Endoscopy;;  SHOULDER SURGERY  2004   left   Past Surgical History:  Procedure Laterality Date   ABDOMINAL SURGERY     hernia repair   CHOLECYSTECTOMY  [id number]   COLONOSCOPY WITH PROPOFOL N/A 07/31/2016   Procedure: COLONOSCOPY WITH PROPOFOL;  Surgeon: Kristen Modena, MD;  [facility];  Service: Endoscopy;  Laterality: N/A;   COLONOSCOPY WITH PROPOFOL N/A 03/08/2020   Procedure: COLONOSCOPY WITH PROPOFOL;  Surgeon: Kristen Modena, MD;  [facility];  Service: Endoscopy;  Laterality: N/A;   FOOT SURGERY Right 06/2007   IR RADIOLOGIST EVAL & MGMT  12/14/2019   KNEE ARTHROSCOPY  09/2008   left   KNEE ARTHROSCOPY  april 2011   left   NASAL SINUS SURGERY     NASAL SINUS SURGERY  1990    ORIF ELBOW FRACTURE Right 10/15/2022   Procedure: OPEN REDUCTION INTERNAL FIXATION (ORIF) OLECRANON FRACTURE;  Surgeon: Kristen Columbia, MD;  [facility];  Service: Orthopedics;  Laterality: Right;   POLYPECTOMY  03/08/2020   Procedure: POLYPECTOMY;  Surgeon: Kristen Modena, MD;  [facility];  Service: Endoscopy;;   SHOULDER SURGERY  2004   left   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  All other systems reviewed and are negative.      Objective:   Physical Exam Vitals and nursing note reviewed.  Constitutional:      Appearance: Normal appearance.  Cardiovascular:     Rate and Rhythm: Normal rate and regular rhythm.     Pulses: Normal pulses.     Heart sounds: Normal heart sounds.  Pulmonary:     Effort: Pulmonary effort is normal.     Breath sounds: Normal breath sounds.  Genitourinary:    Comments: Suprapubic  CatheterL amber urine noted: Urology Following.  Musculoskeletal:     Comments: Normal Muscle Bulk and Muscle Testing Reveals:  Upper Extremities: Full ROM and Muscle Strength 5/5 Lumbar Paraspinal Tenderness: L-4- L-5  Lower  Extremities: Full ROM and Muscle Strength 5/5 Arises from Table slowly Narrow Based  Gait     Skin:    General: Skin is warm and dry.  Neurological:     Mental Status: She is alert and oriented to person, place, and time.  Psychiatric:  Mood and Affect: Mood normal.        Behavior: Behavior normal.         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 Continue to monitor. 12/17/2023 Refilled: Oxycodone 10/325 mg one tablet every 6 hours as needed #120.  Second script e-scribe for the following month. We will continue the opioid monitoring program, this consists of regular clinic visits, examinations, urine drug screen, pill counts as well as use of [place] Controlled Substance Reporting system. A 12 month History has been reviewed on the [place] Controlled Substance Reporting System on 12/17/2023. 2. Morbid obesity: Continue Healthy Diet Regime and HEP. 02/262025 3. Type II diabetes:  Dr. Sharl Ma following. 12/17/2023 4. Reactive Depression: No complaints . Continue to monitor.  12/17/2023. 5. Myofascial Muscle Pain: Continue current medication regimen with Tizanidine . 12/17/2023 6. Bilateral  Chronic Knee Pain: Continue HEP as Tolerated. Continue to Monitor. 12/17/2023 7. Right Greater Trochanter Bursitis:  No complaints today. Continue to alternate Ice and Heat Therapy. Continue to monitor. 12/17/2023     F/U in 2 months

## 2023-12-17 ENCOUNTER — Encounter: Payer: Medicare HMO | Attending: Registered Nurse | Admitting: Registered Nurse

## 2023-12-17 ENCOUNTER — Encounter: Payer: Self-pay | Admitting: Registered Nurse

## 2023-12-17 VITALS — BP 121/77 | HR 95 | Ht 68.0 in | Wt 384.0 lb

## 2023-12-17 DIAGNOSIS — M1711 Unilateral primary osteoarthritis, right knee: Secondary | ICD-10-CM | POA: Diagnosis not present

## 2023-12-17 DIAGNOSIS — M25562 Pain in left knee: Secondary | ICD-10-CM | POA: Diagnosis not present

## 2023-12-17 DIAGNOSIS — M1712 Unilateral primary osteoarthritis, left knee: Secondary | ICD-10-CM | POA: Diagnosis not present

## 2023-12-17 DIAGNOSIS — Z5181 Encounter for therapeutic drug level monitoring: Secondary | ICD-10-CM | POA: Insufficient documentation

## 2023-12-17 DIAGNOSIS — M546 Pain in thoracic spine: Secondary | ICD-10-CM | POA: Diagnosis not present

## 2023-12-17 DIAGNOSIS — Z79891 Long term (current) use of opiate analgesic: Secondary | ICD-10-CM | POA: Diagnosis not present

## 2023-12-17 DIAGNOSIS — M25561 Pain in right knee: Secondary | ICD-10-CM

## 2023-12-17 DIAGNOSIS — G894 Chronic pain syndrome: Secondary | ICD-10-CM | POA: Diagnosis not present

## 2023-12-17 DIAGNOSIS — M7918 Myalgia, other site: Secondary | ICD-10-CM | POA: Diagnosis not present

## 2023-12-17 DIAGNOSIS — M47816 Spondylosis without myelopathy or radiculopathy, lumbar region: Secondary | ICD-10-CM

## 2023-12-17 MED ORDER — OXYCODONE-ACETAMINOPHEN 10-325 MG PO TABS: 1.0000 | ORAL_TABLET | Freq: Four times a day (QID) | ORAL | 0 refills | Status: DC | PRN

## 2023-12-19 ENCOUNTER — Ambulatory Visit: Payer: Medicare HMO | Admitting: Registered Nurse

## 2023-12-31 DIAGNOSIS — Z9359 Other cystostomy status: Secondary | ICD-10-CM | POA: Diagnosis not present

## 2023-12-31 DIAGNOSIS — Z466 Encounter for fitting and adjustment of urinary device: Secondary | ICD-10-CM | POA: Diagnosis not present

## 2024-01-13 DIAGNOSIS — E113293 Type 2 diabetes mellitus with mild nonproliferative diabetic retinopathy without macular edema, bilateral: Secondary | ICD-10-CM | POA: Diagnosis not present

## 2024-01-13 DIAGNOSIS — E1122 Type 2 diabetes mellitus with diabetic chronic kidney disease: Secondary | ICD-10-CM | POA: Diagnosis not present

## 2024-01-13 DIAGNOSIS — I10 Essential (primary) hypertension: Secondary | ICD-10-CM | POA: Diagnosis not present

## 2024-01-13 DIAGNOSIS — Z794 Long term (current) use of insulin: Secondary | ICD-10-CM | POA: Diagnosis not present

## 2024-01-13 DIAGNOSIS — E1165 Type 2 diabetes mellitus with hyperglycemia: Secondary | ICD-10-CM | POA: Diagnosis not present

## 2024-01-20 ENCOUNTER — Other Ambulatory Visit: Payer: Self-pay | Admitting: Family Medicine

## 2024-01-20 DIAGNOSIS — Z1231 Encounter for screening mammogram for malignant neoplasm of breast: Secondary | ICD-10-CM

## 2024-01-23 DIAGNOSIS — Z9359 Other cystostomy status: Secondary | ICD-10-CM | POA: Diagnosis not present

## 2024-01-23 DIAGNOSIS — Z466 Encounter for fitting and adjustment of urinary device: Secondary | ICD-10-CM | POA: Diagnosis not present

## 2024-01-30 DIAGNOSIS — R339 Retention of urine, unspecified: Secondary | ICD-10-CM | POA: Diagnosis not present

## 2024-01-31 DIAGNOSIS — R339 Retention of urine, unspecified: Secondary | ICD-10-CM | POA: Diagnosis not present

## 2024-02-05 DIAGNOSIS — R339 Retention of urine, unspecified: Secondary | ICD-10-CM | POA: Diagnosis not present

## 2024-02-11 DIAGNOSIS — M25521 Pain in right elbow: Secondary | ICD-10-CM | POA: Diagnosis not present

## 2024-02-11 DIAGNOSIS — M79641 Pain in right hand: Secondary | ICD-10-CM | POA: Diagnosis not present

## 2024-02-13 ENCOUNTER — Encounter: Payer: Medicare HMO | Admitting: Registered Nurse

## 2024-02-16 ENCOUNTER — Encounter: Payer: Self-pay | Admitting: Registered Nurse

## 2024-02-16 ENCOUNTER — Encounter: Attending: Registered Nurse | Admitting: Registered Nurse

## 2024-02-16 VITALS — BP 137/82 | HR 92 | Ht 68.0 in | Wt 383.0 lb

## 2024-02-16 DIAGNOSIS — Z79891 Long term (current) use of opiate analgesic: Secondary | ICD-10-CM

## 2024-02-16 DIAGNOSIS — R3915 Urgency of urination: Secondary | ICD-10-CM | POA: Diagnosis not present

## 2024-02-16 DIAGNOSIS — M1711 Unilateral primary osteoarthritis, right knee: Secondary | ICD-10-CM | POA: Diagnosis not present

## 2024-02-16 DIAGNOSIS — M1712 Unilateral primary osteoarthritis, left knee: Secondary | ICD-10-CM

## 2024-02-16 DIAGNOSIS — Z5181 Encounter for therapeutic drug level monitoring: Secondary | ICD-10-CM | POA: Diagnosis not present

## 2024-02-16 DIAGNOSIS — Z466 Encounter for fitting and adjustment of urinary device: Secondary | ICD-10-CM | POA: Diagnosis not present

## 2024-02-16 DIAGNOSIS — M7918 Myalgia, other site: Secondary | ICD-10-CM

## 2024-02-16 DIAGNOSIS — N3289 Other specified disorders of bladder: Secondary | ICD-10-CM | POA: Diagnosis not present

## 2024-02-16 DIAGNOSIS — M47816 Spondylosis without myelopathy or radiculopathy, lumbar region: Secondary | ICD-10-CM

## 2024-02-16 DIAGNOSIS — M546 Pain in thoracic spine: Secondary | ICD-10-CM | POA: Diagnosis not present

## 2024-02-16 DIAGNOSIS — G894 Chronic pain syndrome: Secondary | ICD-10-CM | POA: Diagnosis not present

## 2024-02-16 MED ORDER — OXYCODONE-ACETAMINOPHEN 10-325 MG PO TABS: 1.0000 | ORAL_TABLET | Freq: Four times a day (QID) | ORAL | 0 refills | Status: DC | PRN

## 2024-02-16 NOTE — Progress Notes (Deleted)
 Subjective:    Patient ID: Kristen [name], female    DOB: 02-25-1966, 58 y.o.   MRN: [id number]  HPI   Pain Inventory Average Pain 7 Pain Right Now 8 My pain is sharp  LOCATION OF PAIN  Lower back  BOWEL Number of stools per week: *** Oral laxative use {YES/NO:21197} Type of laxative *** Enema or suppository use {YES/NO:21197} History of colostomy {YES/NO:21197} Incontinent {YES/NO:21197}  BLADDER {bladder options:24190} In and out cath, frequency *** Able to self cath {YES/NO:21197} Bladder incontinence {YES/NO:21197} Frequent urination {YES/NO:21197} Leakage with coughing {YES/NO:21197} Difficulty starting stream {YES/NO:21197} Incomplete bladder emptying {YES/NO:21197}   Mobility {MOBILITY OZH:08657846}  Function {FUNCTION:21022946}  Neuro/Psych {NEURO/PSYCH:21022948}  Prior Studies {CPRM PRIOR STUDIES:21022953}  Physicians involved in your care {CPRM PHYSICIANS INVOLVED IN YOUR CARE:21022954}   Family History  Problem Relation Age of Onset  . Hypertension Maternal Grandmother   . Stroke Maternal Grandmother   . Diabetes Maternal Grandfather   . Cancer Mother        lung  . Cancer Father   . Diabetes Sister   . Breast cancer Cousin   . Heart attack Neg Hx    Social History   Socioeconomic History  . Marital status: Single    Spouse name: Not on file  . Number of children: Not on file  . Years of education: Not on file  . Highest education level: Not on file  Occupational History  . Not on file  Tobacco Use  . Smoking status: Never  . Smokeless tobacco: Never  Vaping Use  . Vaping status: Never Used  Substance and Sexual Activity  . Alcohol  use: No    Alcohol /week: 0.0 standard drinks of alcohol   . Drug use: No  . Sexual activity: Not Currently    Comment: 1st intercourse 22 yo-1 partner  Other Topics Concern  . Not on file  Social History Narrative  . Not on file   Social Drivers of Health   Financial Resource Strain:  Not on file  Food Insecurity: Not on file  Transportation Needs: Not on file  Physical Activity: Not on file  Stress: Not on file  Social Connections: Not on file   Past Surgical History:  Procedure Laterality Date  . ABDOMINAL SURGERY     hernia repair  . CHOLECYSTECTOMY  [id number]  . COLONOSCOPY WITH PROPOFOL  N/A 07/31/2016   Procedure: COLONOSCOPY WITH PROPOFOL ;  Surgeon: Evangeline Hilts, MD;  [facility];  Service: Endoscopy;  Laterality: N/A;  . COLONOSCOPY WITH PROPOFOL  N/A 03/08/2020   Procedure: COLONOSCOPY WITH PROPOFOL ;  Surgeon: Evangeline Hilts, MD;  [facility];  Service: Endoscopy;  Laterality: N/A;  . FOOT SURGERY Right 06/2007  . IR RADIOLOGIST EVAL & MGMT  12/14/2019  . KNEE ARTHROSCOPY  09/2008   left  . KNEE ARTHROSCOPY  april 2011   left  . NASAL SINUS SURGERY    . NASAL SINUS SURGERY  1990  . ORIF ELBOW FRACTURE Right 10/15/2022   Procedure: OPEN REDUCTION INTERNAL FIXATION (ORIF) OLECRANON FRACTURE;  Surgeon: Bettyjane Brunet, MD;  [facility];  Service: Orthopedics;  Laterality: Right;  . POLYPECTOMY  03/08/2020   Procedure: POLYPECTOMY;  Surgeon: Evangeline Hilts, MD;  [facility];  Service: Endoscopy;;  . SHOULDER SURGERY  2004   left   Past Medical History:  Diagnosis Date  . Anxiety   . Asthma   . Chronic kidney disease   . 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 Depression   .  Diabetes mellitus   . History of pulmonary embolus (PE)   . Hypertension   . Migraines   . Near syncope   . Obesity   . PCOS (polycystic ovarian syndrome)   . Peripheral edema   . Sleep apnea    uses cpap set on 10  . 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       Right elbow pain  All other systems reviewed and are negative.      Objective:   Physical Exam        Assessment & Plan:

## 2024-02-16 NOTE — Progress Notes (Signed)
 Subjective:    Patient ID: Kristen [name], female    DOB: [birth year], 58 y.o.   MRN: [id number]  HPI: Kristen [name] is a 58 y.o. female who returns for follow up appointment for chronic pain and medication refill. She states her pain is located in her mid- low back pain. Also reports right elbow pain and Ortho following, she is awaiting a surgical date she reports. She rates her pain 8. Her current exercise regime is walking and performing stretching exercises.  Kristen [name] Morphine  equivalent is 60.00 MME.   Oral Swab ordered today      Pain Inventory Average Pain 7 Pain Right Now 8 My pain is sharp  In the last 24 hours, has pain interfered with the following? General activity 7 Relation with others 7 Enjoyment of life 9 What TIME of day is your pain at its worst? morning , daytime, evening, and night Sleep (in general) Poor  Pain is worse with: walking and bending Pain improves with: heat/ice and medication Relief from Meds: 6  Family History  Problem Relation Age of Onset  . Hypertension Maternal Grandmother   . Stroke Maternal Grandmother   . Diabetes Maternal Grandfather   . Cancer Mother        lung  . Cancer Father   . Diabetes Sister   . Breast cancer Cousin   . Heart attack Neg Hx    Social History   Socioeconomic History  . Marital status: Single    Spouse name: Not on file  . Number of children: Not on file  . Years of education: Not on file  . Highest education level: Not on file  Occupational History  . Not on file  Tobacco Use  . Smoking status: Never  . Smokeless tobacco: Never  Vaping Use  . Vaping status: Never Used  Substance and Sexual Activity  . Alcohol  use: No    Alcohol /week: 0.0 standard drinks of alcohol   . Drug use: No  . Sexual activity: Not Currently    Comment: 1st intercourse 22 yo-1 partner  Other Topics Concern  . Not on file  Social History Narrative  . Not on file   Social Drivers of Health   Financial  Resource Strain: Not on file  Food Insecurity: Not on file  Transportation Needs: Not on file  Physical Activity: Not on file  Stress: Not on file  Social Connections: Not on file   Past Surgical History:  Procedure Laterality Date  . ABDOMINAL SURGERY     hernia repair  . CHOLECYSTECTOMY  [id number]  . COLONOSCOPY WITH PROPOFOL  N/A 07/31/2016   Procedure: COLONOSCOPY WITH PROPOFOL ;  Surgeon: Evangeline Hilts, MD;  [facility];  Service: Endoscopy;  Laterality: N/A;  . COLONOSCOPY WITH PROPOFOL  N/A 03/08/2020   Procedure: COLONOSCOPY WITH PROPOFOL ;  Surgeon: Evangeline Hilts, MD;  [facility];  Service: Endoscopy;  Laterality: N/A;  . FOOT SURGERY Right 06/2007  . IR RADIOLOGIST EVAL & MGMT  12/14/2019  . KNEE ARTHROSCOPY  09/2008   left  . KNEE ARTHROSCOPY  april 2011   left  . NASAL SINUS SURGERY    . NASAL SINUS SURGERY  1990  . ORIF ELBOW FRACTURE Right 10/15/2022   Procedure: OPEN REDUCTION INTERNAL FIXATION (ORIF) OLECRANON FRACTURE;  Surgeon: Bettyjane Brunet, MD;  [facility];  Service: Orthopedics;  Laterality: Right;  . POLYPECTOMY  03/08/2020   Procedure: POLYPECTOMY;  Surgeon: Evangeline Hilts, MD;  [facility];  Service: Endoscopy;;  .  SHOULDER SURGERY  2004   left   Past Surgical History:  Procedure Laterality Date  . ABDOMINAL SURGERY     hernia repair  . CHOLECYSTECTOMY  [id number]  . COLONOSCOPY WITH PROPOFOL  N/A 07/31/2016   Procedure: COLONOSCOPY WITH PROPOFOL ;  Surgeon: Evangeline Hilts, MD;  [facility];  Service: Endoscopy;  Laterality: N/A;  . COLONOSCOPY WITH PROPOFOL  N/A 03/08/2020   Procedure: COLONOSCOPY WITH PROPOFOL ;  Surgeon: Evangeline Hilts, MD;  [facility];  Service: Endoscopy;  Laterality: N/A;  . FOOT SURGERY Right 06/2007  . IR RADIOLOGIST EVAL & MGMT  12/14/2019  . KNEE ARTHROSCOPY  09/2008   left  . KNEE ARTHROSCOPY  april 2011   left  . NASAL SINUS SURGERY    . NASAL SINUS SURGERY  1990  .  ORIF ELBOW FRACTURE Right 10/15/2022   Procedure: OPEN REDUCTION INTERNAL FIXATION (ORIF) OLECRANON FRACTURE;  Surgeon: Bettyjane Brunet, MD;  [facility];  Service: Orthopedics;  Laterality: Right;  . POLYPECTOMY  03/08/2020   Procedure: POLYPECTOMY;  Surgeon: Evangeline Hilts, MD;  [facility];  Service: Endoscopy;;  . SHOULDER SURGERY  2004   left   Past Medical History:  Diagnosis Date  . Anxiety   . Asthma   . Chronic kidney disease   . 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 Depression   . Diabetes mellitus   . History of pulmonary embolus (PE)   . Hypertension   . Migraines   . Near syncope   . Obesity   . PCOS (polycystic ovarian syndrome)   . Peripheral edema   . Sleep apnea    uses cpap set on 10  . 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  Constitutional:      Appearance: Normal appearance. She is obese.  Cardiovascular:     Rate and Rhythm: Normal rate and regular rhythm.     Pulses: Normal pulses.     Heart sounds: Normal heart sounds.  Pulmonary:     Effort: Pulmonary effort is normal.     Breath sounds: Normal breath sounds.  Genitourinary:    Comments: Suprapubic Catheter: Urine with sediment and Order: Urology following She has an appointment today she reports and scheduled for catheter change Musculoskeletal:     Comments: Normal Muscle Bulk and  Muscle Testing Reveals:  Upper Extremities: Full ROM and Muscle Strength  5/5  Lumbar Hypersensitivity Lower Extremities: Full ROM and Muscle Strength 5/5 Arises from Table slowly  using cane for support Antalgic  Gait     Neurological:  Mental Status: She is alert. She is disoriented.  Psychiatric:        Mood and Affect: Mood normal.        Behavior: Behavior normal.         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 Continue to monitor. 02/16/2024 Refilled: Oxycodone  10/325 mg one tablet every 6 hours as needed #120.  Second script e-scribe for the following month. We will continue the opioid monitoring program, this consists of regular clinic visits, examinations, urine drug screen, pill counts as well as use of [place]  Controlled Substance Reporting system. A 12 month History has been reviewed on the [place]  Controlled Substance Reporting System on 02/16/2024. 2. Morbid obesity: Continue Healthy Diet Regime and HEP. 04/282025 3. Type II diabetes:  Dr. Kathyanne Parkers following. 02/16/2024 4. Reactive Depression: No complaints . Continue to monitor.  02/16/2024. 5. Myofascial Muscle Pain: Continue current medication regimen with Tizanidine  . 02/16/2024 6. Bilateral  Chronic Knee Pain: Continue HEP as Tolerated. Continue to Monitor. 02/16/2024 7. Right Greater Trochanter Bursitis:  No complaints today. Continue to alternate Ice and Heat Therapy. Continue to monitor. 02/16/2024     F/U in 2 months

## 2024-02-22 LAB — DRUG TOX MONITOR 1 W/CONF, ORAL FLD
Amphetamines: NEGATIVE ng/mL (ref <10)
Barbiturates: NEGATIVE ng/mL (ref <10)
Benzodiazepines: NEGATIVE ng/mL (ref <0.50)
Buprenorphine: NEGATIVE ng/mL (ref <0.10)
Cocaine: NEGATIVE ng/mL (ref <5.0)
Codeine: NEGATIVE ng/mL (ref <2.5)
Dihydrocodeine: NEGATIVE ng/mL (ref <2.5)
Fentanyl: NEGATIVE ng/mL (ref <0.10)
Heroin Metabolite: NEGATIVE ng/mL (ref <1.0)
Hydrocodone: NEGATIVE ng/mL (ref <2.5)
Hydromorphone: NEGATIVE ng/mL (ref <2.5)
MARIJUANA: NEGATIVE ng/mL (ref <2.5)
MDMA: NEGATIVE ng/mL (ref <10)
Meprobamate: NEGATIVE ng/mL (ref <2.5)
Methadone: NEGATIVE ng/mL (ref <5.0)
Morphine: NEGATIVE ng/mL (ref <2.5)
Nicotine Metabolite: NEGATIVE ng/mL (ref <5.0)
Norhydrocodone: NEGATIVE ng/mL (ref <2.5)
Noroxycodone: 38.4 ng/mL — ABNORMAL HIGH (ref <2.5)
Opiates: POSITIVE ng/mL — AB (ref <2.5)
Oxycodone: 50.1 ng/mL — ABNORMAL HIGH (ref <2.5)
Oxymorphone: NEGATIVE ng/mL (ref <2.5)
Phencyclidine: NEGATIVE ng/mL (ref <10)
Tapentadol: NEGATIVE ng/mL (ref <5.0)
Tramadol: NEGATIVE ng/mL (ref <5.0)
Zolpidem: NEGATIVE ng/mL (ref <5.0)

## 2024-02-22 LAB — DRUG TOX ALC METAB W/CON, ORAL FLD: Alcohol Metabolite: NEGATIVE ng/mL (ref <25)

## 2024-02-25 DIAGNOSIS — Z9359 Other cystostomy status: Secondary | ICD-10-CM | POA: Diagnosis not present

## 2024-02-25 DIAGNOSIS — Z466 Encounter for fitting and adjustment of urinary device: Secondary | ICD-10-CM | POA: Diagnosis not present

## 2024-02-25 DIAGNOSIS — N3289 Other specified disorders of bladder: Secondary | ICD-10-CM | POA: Diagnosis not present

## 2024-03-08 NOTE — Progress Notes (Signed)
 Surgery orders requested via Epic inbox.

## 2024-03-08 NOTE — H&P (Signed)
 Kristen [name] is an 58 y.o. female.   Chief Complaint: Right elbow pain HPI: Kristen [name] is in for the first time in more than a year.  She is about a year and a half from right elbow ORIF.  Since the surgery she has been unable to lay her arm on any structure due to discomfort.  She feels the screws poking out about her elbow.  She also has pain with full extension.  She uses oral anti-inflammatories, as well as, Voltaren gel.  She has some lesser pain in the hand and some swelling there.    Radiographs:  X-rays that were ordered, performed, and interpreted by me today included 2 views of the right elbow.  These show well-placed hardware.  We also took 3 views of the right hand which show some degenerative changes but no sign of fracture.  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     Past Surgical History:  Procedure Laterality Date   ABDOMINAL SURGERY     hernia repair   CHOLECYSTECTOMY  [id number]   COLONOSCOPY WITH PROPOFOL  N/A 07/31/2016   Procedure: COLONOSCOPY WITH PROPOFOL ;  Surgeon: Kristen Hilts, MD;  Location: [facility];  Service: Endoscopy;  Laterality: N/A;   COLONOSCOPY WITH PROPOFOL  N/A 03/08/2020   Procedure: COLONOSCOPY WITH PROPOFOL ;  Surgeon: Kristen Hilts, MD;  Location: [facility];  Service: Endoscopy;  Laterality: N/A;   FOOT SURGERY Right 06/2007   IR RADIOLOGIST EVAL & MGMT  12/14/2019   KNEE ARTHROSCOPY  09/2008   left   KNEE ARTHROSCOPY  april 2011   left   NASAL SINUS SURGERY     NASAL SINUS SURGERY  1990   ORIF ELBOW FRACTURE Right 10/15/2022   Procedure: OPEN REDUCTION INTERNAL FIXATION  (ORIF) OLECRANON FRACTURE;  Surgeon: Kristen Brunet, MD;  Location: [facility];  Service: Orthopedics;  Laterality: Right;   POLYPECTOMY  03/08/2020   Procedure: POLYPECTOMY;  Surgeon: Kristen Hilts, MD;  Location: [facility];  Service: Endoscopy;;   SHOULDER SURGERY  2004   left    Family History  Problem Relation Age of Onset   Hypertension Maternal Grandmother    Stroke Maternal Grandmother    Diabetes Maternal Grandfather    Cancer Mother        lung   Cancer Father    Diabetes Sister    Breast cancer Cousin    Heart attack Neg Hx    Social History:  reports that she has never smoked. She has never used smokeless tobacco. She reports that she does not drink alcohol  and does not use drugs.  Allergies:  Allergies  Allergen Reactions   Cafergot Other (See Comments)    Chest pain; ergotamine-caffiene   Glucophage [Metformin Hcl] Anaphylaxis   Metformin Anaphylaxis   Ergotamine-Caffeine Other (See Comments)    Chest pain   Invokana [Canagliflozin] Swelling and Other (See Comments)    Legs Swell invokana Legs Swell    No medications prior to  admission.    No results found for this or any previous visit (from the past 48 hours). No results found.  Review of Systems  Musculoskeletal:  Positive for arthralgias.       Right elbow pain  All other systems reviewed and are negative.   There were no vitals taken for this visit. Physical Exam Constitutional:      Appearance: Normal appearance.  HENT:     Head: Normocephalic and atraumatic.     Mouth/Throat:     Pharynx: Oropharynx is clear.  Eyes:     Extraocular Movements: Extraocular movements intact.  Pulmonary:     Effort: Pulmonary effort is normal.  Abdominal:     Palpations: Abdomen is soft.  Musculoskeletal:     Cervical back: Normal range of motion.     Comments: Right elbow motion is from just short of full extension to about 120 of flexion.  She has full pronation and supination.  She has pain about the  prominent hardware on the olecranon.  Her scar looks benign.  She has some swelling at the IP joints of her fingers, but fairly good motion and intact sensation.    Skin:    General: Skin is warm and dry.  Neurological:     General: No focal deficit present.     Mental Status: She is alert and oriented to person, place, and time. Mental status is at baseline.  Psychiatric:        Mood and Affect: Mood normal.        Behavior: Behavior normal.        Thought Content: Thought content normal.        Judgment: Judgment normal.      Assessment/Plan Assessment: Status post right elbow ORIF 2023 with painful hardware  Plan: Anahy has hardware pain at the right elbow.  She cannot lay her elbow on anything and basically would like her hardware removed.  I reviewed risk of anesthesia, infection, and repeat fracture in some detail.  Looking back at her operative note by Dr. Hosea Mac the implant was by [employer].  Kristen [name] Kristen Arvanitis, PA-C 03/08/2024, 1:25 PM

## 2024-03-10 NOTE — Patient Instructions (Addendum)
 SURGICAL WAITING ROOM VISITATION  Patients having surgery or a procedure may have no more than 2 support people in the waiting area - these visitors may rotate.    Children under the age of 26 must have an adult with them who is not the patient.  Due to an increase in RSV and influenza rates and associated hospitalizations, children ages 26 and under may not visit patients in [facility] hospitals.  Visitors with respiratory illnesses are discouraged from visiting and should remain at home.  If the patient needs to stay at the hospital during part of their recovery, the visitor guidelines for inpatient rooms apply. Pre-op nurse will coordinate an appropriate time for 1 support person to accompany patient in pre-op.  This support person may not rotate.    Please refer to the [facility] website for the visitor guidelines for Inpatients (after your surgery is over and you are in a regular room).       Your procedure is scheduled on: 03/16/24   Report to [facility] Main Entrance    Report to admitting at : 5:15 AM   Call this number if you have problems the morning of surgery [phone]   Do not eat food :After Midnight.   After Midnight you may have the following liquids until : 4:30 AM DAY OF SURGERY  Water Non-Citrus Juices (without pulp, NO RED-Apple, White grape, White cranberry) Black Coffee (NO MILK/CREAM OR CREAMERS, sugar ok)  Clear Tea (NO MILK/CREAM OR CREAMERS, sugar ok) regular and decaf                             Plain Jell-O (NO RED)                                           Fruit ices (not with fruit pulp, NO RED)                                     Popsicles (NO RED)                                                               Sports drinks like Gatorade (NO RED)   FOLLOW ANY ADDITIONAL PRE OP INSTRUCTIONS YOU RECEIVED FROM YOUR SURGEON'S OFFICE!!!   Oral Hygiene is also important to reduce your risk of infection.                                     Remember - BRUSH YOUR TEETH THE MORNING OF SURGERY WITH YOUR REGULAR TOOTHPASTE  DENTURES WILL BE REMOVED PRIOR TO SURGERY PLEASE DO NOT APPLY "Poly grip" OR ADHESIVES!!!   Do NOT smoke after Midnight   Stop all vitamins and herbal supplements 7 days before surgery.   Take these medicines the morning of surgery with A SIP OF WATER: bupropion,nitrofurantion,famotidine.Use inhalers as usual and bring them.Tylenol  as needed.  How to Manage Your Diabetes Before and After Surgery  Why is it important  to control my blood sugar before and after surgery? Improving blood sugar levels before and after surgery helps healing and can limit problems. A way of improving blood sugar control is eating a healthy diet by:  Eating less sugar and carbohydrates  Increasing activity/exercise  Talking with your doctor about reaching your blood sugar goals High blood sugars (greater than 180 mg/dL) can raise your risk of infections and slow your recovery, so you will need to focus on controlling your diabetes during the weeks before surgery. Make sure that the doctor who takes care of your diabetes knows about your planned surgery including the date and location.  How do I manage my blood sugar before surgery? Check your blood sugar at least 4 times a day, starting 2 days before surgery, to make sure that the level is not too high or low. Check your blood sugar the morning of your surgery when you wake up and every 2 hours until you get to the Short Stay unit. If your blood sugar is less than 70 mg/dL, you will need to treat for low blood sugar: Do not take insulin . Treat a low blood sugar (less than 70 mg/dL) with  cup of clear juice (cranberry or apple), 4 glucose tablets, OR glucose gel. Recheck blood sugar in 15 minutes after treatment (to make sure it is greater than 70 mg/dL). If your blood sugar is not greater than 70 mg/dL on recheck, call [phone] for further instructions. Report your blood sugar  to the short stay nurse when you get to Short Stay.  If you are admitted to the hospital after surgery: Your blood sugar will be checked by the staff and you will probably be given insulin  after surgery (instead of oral diabetes medicines) to make sure you have good blood sugar levels. The goal for blood sugar control after surgery is 80-180 mg/dL.   WHAT DO I DO ABOUT MY DIABETES MEDICATION?  Do not take oral diabetes medicines (pills) the morning of surgery.  THE NIGHT BEFORE SURGERY, take ONLY half of lantus  insulin  dose (60 units) . DO NOT take insulin  aspart night dose.      THE MORNING OF SURGERY, take   units of         insulin .DO NOT TAKE ANY ORAL DIABETIC MEDICATIONS DAY OF YOUR SURGERY  If your CBG is greater than 220 mg/dL, you may take  of your sliding scale  (correction) dose of insulin .                                You may not have any metal on your body including hair pins, jewelry, and body piercing             Do not wear make-up, lotions, powders, perfumes/cologne, or deodorant  Do not wear nail polish including gel and S&S, artificial/acrylic nails, or any other type of covering on natural nails including finger and toenails. If you have artificial nails, gel coating, etc. that needs to be removed by a nail salon please have this removed prior to surgery or surgery may need to be canceled/ delayed if the surgeon/ anesthesia feels like they are unable to be safely monitored.   Do not shave  48 hours prior to surgery.    Do not bring valuables to the hospital. [facility] IS NOT             RESPONSIBLE   FOR VALUABLES.  Contacts, glasses, dentures or bridgework may not be worn into surgery.   Bring small overnight bag day of surgery.   DO NOT BRING YOUR HOME MEDICATIONS TO THE HOSPITAL. PHARMACY WILL DISPENSE MEDICATIONS LISTED ON YOUR MEDICATION LIST TO YOU DURING YOUR ADMISSION IN THE HOSPITAL!    Patients discharged on the day of surgery will not be allowed  to drive home.  Someone NEEDS to stay with you for the first 24 hours after anesthesia.   Special Instructions: Bring a copy of your healthcare power of attorney and living will documents the day of surgery if you haven't scanned them before.              Please read over the following fact sheets you were given: IF YOU HAVE QUESTIONS ABOUT YOUR PRE-OP INSTRUCTIONS PLEASE CALL [phone]   If you received a COVID test during your pre-op visit  it is requested that you wear a mask when out in public, stay away from anyone that may not be feeling well and notify your surgeon if you develop symptoms. If you test positive for Covid or have been in contact with anyone that has tested positive in the last 10 days please notify you surgeon.    [employer] - Preparing for Surgery Before surgery, you can play an important role.  Because skin is not sterile, your skin needs to be as free of germs as possible.  You can reduce the number of germs on your skin by washing with CHG (chlorahexidine gluconate) soap before surgery.  CHG is an antiseptic cleaner which kills germs and bonds with the skin to continue killing germs even after washing. Please DO NOT use if you have an allergy to CHG or antibacterial soaps.  If your skin becomes reddened/irritated stop using the CHG and inform your nurse when you arrive at Short Stay. Do not shave (including legs and underarms) for at least 48 hours prior to the first CHG shower.  You may shave your face/neck. Please follow these instructions carefully:  1.  Shower with CHG Soap the night before surgery and the  morning of Surgery.  2.  If you choose to wash your hair, wash your hair first as usual with your  normal  shampoo.  3.  After you shampoo, rinse your hair and body thoroughly to remove the  shampoo.                           4.  Use CHG as you would any other liquid soap.  You can apply chg directly  to the skin and wash                       Gently with a  scrungie or clean washcloth.  5.  Apply the CHG Soap to your body ONLY FROM THE NECK DOWN.   Do not use on face/ open                           Wound or open sores. Avoid contact with eyes, ears mouth and genitals (private parts).                       Wash face,  Genitals (private parts) with your normal soap.             6.  Wash thoroughly, paying special attention to the area  where your surgery  will be performed.  7.  Thoroughly rinse your body with warm water from the neck down.  8.  DO NOT shower/wash with your normal soap after using and rinsing off  the CHG Soap.                9.  Pat yourself dry with a clean towel.            10.  Wear clean pajamas.            11.  Place clean sheets on your bed the night of your first shower and do not  sleep with pets. Day of Surgery : Do not apply any lotions/deodorants the morning of surgery.  Please wear clean clothes to the hospital/surgery center.  FAILURE TO FOLLOW THESE INSTRUCTIONS MAY RESULT IN THE CANCELLATION OF YOUR SURGERY PATIENT SIGNATURE_________________________________  NURSE SIGNATURE__________________________________  ________________________________________________________________________

## 2024-03-11 ENCOUNTER — Encounter (HOSPITAL_COMMUNITY)
Admission: RE | Admit: 2024-03-11 | Discharge: 2024-03-11 | Disposition: A | Discharge status: Home / Self Care | Source: Ambulatory Visit | Attending: Orthopaedic Surgery | Admitting: Orthopaedic Surgery

## 2024-03-11 ENCOUNTER — Other Ambulatory Visit: Payer: Self-pay

## 2024-03-11 ENCOUNTER — Encounter (HOSPITAL_COMMUNITY): Payer: Self-pay

## 2024-03-11 VITALS — BP 147/71 | HR 76 | Temp 98.4°F | Ht 68.0 in | Wt 384.0 lb

## 2024-03-11 DIAGNOSIS — E1122 Type 2 diabetes mellitus with diabetic chronic kidney disease: Secondary | ICD-10-CM | POA: Diagnosis not present

## 2024-03-11 DIAGNOSIS — I10 Essential (primary) hypertension: Secondary | ICD-10-CM

## 2024-03-11 DIAGNOSIS — Z01818 Encounter for other preprocedural examination: Secondary | ICD-10-CM | POA: Diagnosis not present

## 2024-03-11 DIAGNOSIS — I129 Hypertensive chronic kidney disease with stage 1 through stage 4 chronic kidney disease, or unspecified chronic kidney disease: Secondary | ICD-10-CM | POA: Diagnosis not present

## 2024-03-11 DIAGNOSIS — N1831 Chronic kidney disease, stage 3a: Secondary | ICD-10-CM | POA: Insufficient documentation

## 2024-03-11 HISTORY — DX: Dyspnea, unspecified: R06.00

## 2024-03-11 LAB — BASIC METABOLIC PANEL WITH GFR
Anion gap: 5 (ref 5–15)
BUN: 23 mg/dL — ABNORMAL HIGH (ref 6–20)
CO2: 28 mmol/L (ref 22–32)
Calcium: 9.2 mg/dL (ref 8.9–10.3)
Chloride: 104 mmol/L (ref 98–111)
Creatinine, Ser: 1.34 mg/dL — ABNORMAL HIGH (ref 0.44–1.00)
GFR, Estimated: 46 mL/min — ABNORMAL LOW (ref >60)
Glucose, Bld: 198 mg/dL — ABNORMAL HIGH (ref 70–99)
Potassium: 4.9 mmol/L (ref 3.5–5.1)
Sodium: 137 mmol/L (ref 135–145)

## 2024-03-11 LAB — CBC
HCT: 43.7 % (ref 36.0–46.0)
Hemoglobin: 14.3 g/dL (ref 12.0–15.0)
MCH: 32 pg (ref 26.0–34.0)
MCHC: 32.7 g/dL (ref 30.0–36.0)
MCV: 97.8 fL (ref 80.0–100.0)
Platelets: 203 10*3/uL (ref 150–400)
RBC: 4.47 MIL/uL (ref 3.87–5.11)
RDW: 13 % (ref 11.5–15.5)
WBC: 7.3 10*3/uL (ref 4.0–10.5)
nRBC: 0 % (ref 0.0–0.2)

## 2024-03-11 LAB — GLUCOSE, CAPILLARY: Glucose-Capillary: 204 mg/dL — ABNORMAL HIGH (ref 70–99)

## 2024-03-11 LAB — HEMOGLOBIN A1C
Hgb A1c MFr Bld: 7.8 % — ABNORMAL HIGH (ref 4.8–5.6)
Mean Plasma Glucose: 177.16 mg/dL

## 2024-03-11 NOTE — Progress Notes (Addendum)
 For Anesthesia: PCP - Jimmey Mould, MD  Cardiologist - N/A  Bowel Prep reminder:  Chest x-ray -  EKG - 03/11/24 Stress Test -  ECHO - 03/22/17 Cardiac Cath -  Pacemaker/ICD device last checked: Pacemaker orders received: Device Rep notified:  Spinal Cord Stimulator:N/A  Sleep Study - Yes CPAP - NO  Fasting Blood Sugar - 140's Checks Blood Sugar ___1__ times a day Date and result of last Hgb A1c-  Last dose of GLP1 agonist- N/A GLP1 instructions:   Last dose of SGLT-2 inhibitors- N/A SGLT-2 instructions:   Blood Thinner Instructions:Eliquis  will be on hold after: 03/13/24 Aspirin  Instructions: Last Dose:  Activity level: Can go up a flight of stairs and activities of daily living without stopping and without chest pain and/or shortness of breath  Unable to go up a flight of stairs without chest pain and/or shortness of breath    Anesthesia review: Hx: DIA,HTN,Stroke,PE,OSA(NO CPAP), CKD 3.,suprapubic catheter.  Patient denies shortness of breath, fever, cough and chest pain at PAT appointment   Patient verbalized understanding of instructions that were given to them at the PAT appointment. Patient was also instructed that they will need to review over the PAT instructions again at home before surgery.

## 2024-03-12 ENCOUNTER — Ambulatory Visit

## 2024-03-12 DIAGNOSIS — R3915 Urgency of urination: Secondary | ICD-10-CM | POA: Diagnosis not present

## 2024-03-12 DIAGNOSIS — Z466 Encounter for fitting and adjustment of urinary device: Secondary | ICD-10-CM | POA: Diagnosis not present

## 2024-03-12 DIAGNOSIS — N3289 Other specified disorders of bladder: Secondary | ICD-10-CM | POA: Diagnosis not present

## 2024-03-15 NOTE — Anesthesia Preprocedure Evaluation (Signed)
 Anesthesia Evaluation  Patient identified by MRN, date of birth, ID band Patient awake    Reviewed: Allergy & Precautions, NPO status , Patient's Chart, lab work & pertinent test results  History of Anesthesia Complications Negative for: history of anesthetic complications  Airway Mallampati: III  TM Distance: >3 FB Neck ROM: Full    Dental  (+) Dental Advisory Given, Missing   Pulmonary asthma , sleep apnea (noncompliant) , PE   Pulmonary exam normal        Cardiovascular hypertension, Pt. on medications Normal cardiovascular exam     Neuro/Psych  Headaches PSYCHIATRIC DISORDERS Anxiety Depression    CVA (optic nerve), No Residual Symptoms    GI/Hepatic negative GI ROS, Neg liver ROS,,,  Endo/Other  diabetes, Type 2, Insulin  Dependent  Class 4 obesity  Renal/GU CRFRenal disease     Musculoskeletal  (+) Arthritis ,    Abdominal  (+) + obese  Peds  Hematology  On eliquis     Anesthesia Other Findings   Reproductive/Obstetrics  PCOS                              Anesthesia Physical Anesthesia Plan  ASA: 4  Anesthesia Plan: General   Post-op Pain Management: Tylenol  PO (pre-op)*   Induction: Intravenous  PONV Risk Score and Plan: 3 and Treatment may vary due to age or medical condition, Ondansetron , Dexamethasone  and Midazolam   Airway Management Planned: Oral ETT  Additional Equipment: None  Intra-op Plan:   Post-operative Plan: Extubation in OR  Informed Consent: I have reviewed the patients History and Physical, chart, labs and discussed the procedure including the risks, benefits and alternatives for the proposed anesthesia with the patient or authorized representative who has indicated his/her understanding and acceptance.     Dental advisory given  Plan Discussed with: CRNA and Anesthesiologist  Anesthesia Plan Comments:        Anesthesia Quick Evaluation

## 2024-03-16 ENCOUNTER — Ambulatory Visit (HOSPITAL_BASED_OUTPATIENT_CLINIC_OR_DEPARTMENT_OTHER): Payer: Self-pay | Admitting: Anesthesiology

## 2024-03-16 ENCOUNTER — Ambulatory Visit (HOSPITAL_COMMUNITY)
Admission: RE | Admit: 2024-03-16 | Discharge: 2024-03-16 | Disposition: A | Discharge status: Home / Self Care | Attending: Orthopaedic Surgery | Admitting: Orthopaedic Surgery

## 2024-03-16 ENCOUNTER — Ambulatory Visit (HOSPITAL_COMMUNITY): Payer: Self-pay | Admitting: Physician Assistant

## 2024-03-16 ENCOUNTER — Encounter (HOSPITAL_COMMUNITY): Payer: Self-pay | Admitting: Orthopaedic Surgery

## 2024-03-16 ENCOUNTER — Encounter (HOSPITAL_COMMUNITY)
Admission: RE | Disposition: A | Discharge status: Home / Self Care | Payer: Self-pay | Source: Home / Self Care | Attending: Orthopaedic Surgery

## 2024-03-16 DIAGNOSIS — Z794 Long term (current) use of insulin: Secondary | ICD-10-CM | POA: Diagnosis not present

## 2024-03-16 DIAGNOSIS — G4730 Sleep apnea, unspecified: Secondary | ICD-10-CM | POA: Insufficient documentation

## 2024-03-16 DIAGNOSIS — I129 Hypertensive chronic kidney disease with stage 1 through stage 4 chronic kidney disease, or unspecified chronic kidney disease: Secondary | ICD-10-CM

## 2024-03-16 DIAGNOSIS — T8484XA Pain due to internal orthopedic prosthetic devices, implants and grafts, initial encounter: Secondary | ICD-10-CM | POA: Insufficient documentation

## 2024-03-16 DIAGNOSIS — E6689 Other obesity not elsewhere classified: Secondary | ICD-10-CM | POA: Insufficient documentation

## 2024-03-16 DIAGNOSIS — Y792 Prosthetic and other implants, materials and accessory orthopedic devices associated with adverse incidents: Secondary | ICD-10-CM | POA: Diagnosis not present

## 2024-03-16 DIAGNOSIS — N183 Chronic kidney disease, stage 3 unspecified: Secondary | ICD-10-CM

## 2024-03-16 DIAGNOSIS — Z6841 Body Mass Index (BMI) 40.0 and over, adult: Secondary | ICD-10-CM | POA: Insufficient documentation

## 2024-03-16 DIAGNOSIS — N189 Chronic kidney disease, unspecified: Secondary | ICD-10-CM | POA: Insufficient documentation

## 2024-03-16 DIAGNOSIS — E1122 Type 2 diabetes mellitus with diabetic chronic kidney disease: Secondary | ICD-10-CM

## 2024-03-16 HISTORY — PX: HARDWARE REMOVAL: SHX979

## 2024-03-16 LAB — GLUCOSE, CAPILLARY
Glucose-Capillary: 207 mg/dL — ABNORMAL HIGH (ref 70–99)
Glucose-Capillary: 206 mg/dL — ABNORMAL HIGH (ref 70–99)
Glucose-Capillary: 190 mg/dL — ABNORMAL HIGH (ref 70–99)

## 2024-03-16 SURGERY — REMOVAL, HARDWARE
Anesthesia: General | Laterality: Right

## 2024-03-16 MED ORDER — INSULIN ASPART 100 UNIT/ML IJ SOLN
0.0000 [IU] | INTRAMUSCULAR | Status: AC | PRN
  Administered 2024-03-16 (×2): 4 [IU] via SUBCUTANEOUS
  Filled 2024-03-16: qty 1

## 2024-03-16 MED ORDER — AMISULPRIDE (ANTIEMETIC) 5 MG/2ML IV SOLN: 10.0000 mg | Freq: Once | INTRAVENOUS | Status: DC | PRN

## 2024-03-16 MED ORDER — DEXAMETHASONE SODIUM PHOSPHATE 10 MG/ML IJ SOLN
INTRAMUSCULAR | Status: AC
  Filled 2024-03-16: qty 1

## 2024-03-16 MED ORDER — ORAL CARE MOUTH RINSE: 15.0000 mL | Freq: Once | OROMUCOSAL | Status: AC

## 2024-03-16 MED ORDER — OXYCODONE HCL 5 MG/5ML PO SOLN: 5.0000 mg | Freq: Once | ORAL | Status: AC | PRN

## 2024-03-16 MED ORDER — OXYCODONE HCL 5 MG PO TABS
5.0000 mg | ORAL_TABLET | Freq: Once | ORAL | Status: AC | PRN
  Administered 2024-03-16: 5 mg via ORAL

## 2024-03-16 MED ORDER — ONDANSETRON HCL 4 MG/2ML IJ SOLN
INTRAMUSCULAR | Status: DC | PRN
  Administered 2024-03-16: 4 mg via INTRAVENOUS

## 2024-03-16 MED ORDER — ROCURONIUM BROMIDE 10 MG/ML (PF) SYRINGE
PREFILLED_SYRINGE | INTRAVENOUS | Status: AC
  Filled 2024-03-16: qty 10

## 2024-03-16 MED ORDER — FENTANYL CITRATE PF 50 MCG/ML IJ SOSY
25.0000 ug | PREFILLED_SYRINGE | INTRAMUSCULAR | Status: DC | PRN
  Administered 2024-03-16 (×2): 50 ug via INTRAVENOUS

## 2024-03-16 MED ORDER — DEXAMETHASONE SODIUM PHOSPHATE 10 MG/ML IJ SOLN
INTRAMUSCULAR | Status: DC | PRN
  Administered 2024-03-16: 5 mg via INTRAVENOUS

## 2024-03-16 MED ORDER — LACTATED RINGERS IV SOLN: INTRAVENOUS | Status: DC

## 2024-03-16 MED ORDER — PHENYLEPHRINE HCL-NACL 20-0.9 MG/250ML-% IV SOLN
INTRAVENOUS | Status: AC
  Filled 2024-03-16: qty 250

## 2024-03-16 MED ORDER — OXYCODONE HCL 5 MG PO TABS
ORAL_TABLET | ORAL | Status: AC
  Filled 2024-03-16: qty 1

## 2024-03-16 MED ORDER — CEFAZOLIN SODIUM-DEXTROSE 3-4 GM/150ML-% IV SOLN
3.0000 g | Freq: Once | INTRAVENOUS | Status: AC
  Administered 2024-03-16: 3 g via INTRAVENOUS
  Filled 2024-03-16: qty 150

## 2024-03-16 MED ORDER — SODIUM CHLORIDE 0.9 % IV SOLN: 12.5000 mg | INTRAVENOUS | Status: DC | PRN

## 2024-03-16 MED ORDER — PROPOFOL 10 MG/ML IV BOLUS
INTRAVENOUS | Status: DC | PRN
  Administered 2024-03-16: 200 mg via INTRAVENOUS

## 2024-03-16 MED ORDER — FENTANYL CITRATE (PF) 100 MCG/2ML IJ SOLN
INTRAMUSCULAR | Status: DC | PRN
  Administered 2024-03-16: 100 ug via INTRAVENOUS

## 2024-03-16 MED ORDER — BUPIVACAINE-EPINEPHRINE 0.25% -1:200000 IJ SOLN
INTRAMUSCULAR | Status: DC | PRN
  Administered 2024-03-16: 10 mL

## 2024-03-16 MED ORDER — PHENYLEPHRINE HCL (PRESSORS) 10 MG/ML IV SOLN
INTRAVENOUS | Status: DC | PRN
Start: 2024-03-16 — End: 2024-03-16
  Administered 2024-03-16: 160 ug via INTRAVENOUS
  Administered 2024-03-16 (×3): 80 ug via INTRAVENOUS

## 2024-03-16 MED ORDER — LIDOCAINE HCL (PF) 2 % IJ SOLN
INTRAMUSCULAR | Status: AC
  Filled 2024-03-16: qty 5

## 2024-03-16 MED ORDER — PROPOFOL 10 MG/ML IV BOLUS
INTRAVENOUS | Status: AC
  Filled 2024-03-16: qty 20

## 2024-03-16 MED ORDER — MIDAZOLAM HCL 2 MG/2ML IJ SOLN
INTRAMUSCULAR | Status: DC | PRN
  Administered 2024-03-16 (×2): 1 mg via INTRAVENOUS

## 2024-03-16 MED ORDER — SUGAMMADEX SODIUM 200 MG/2ML IV SOLN
INTRAVENOUS | Status: DC | PRN
  Administered 2024-03-16 (×2): 200 mg via INTRAVENOUS

## 2024-03-16 MED ORDER — BUPIVACAINE-EPINEPHRINE (PF) 0.25% -1:200000 IJ SOLN
INTRAMUSCULAR | Status: AC
  Filled 2024-03-16: qty 30

## 2024-03-16 MED ORDER — CHLORHEXIDINE GLUCONATE 0.12 % MT SOLN
15.0000 mL | Freq: Once | OROMUCOSAL | Status: AC
  Administered 2024-03-16: 15 mL via OROMUCOSAL

## 2024-03-16 MED ORDER — ROCURONIUM BROMIDE 10 MG/ML (PF) SYRINGE
PREFILLED_SYRINGE | INTRAVENOUS | Status: DC | PRN
Start: 2024-03-16 — End: 2024-03-16
  Administered 2024-03-16: 60 mg via INTRAVENOUS

## 2024-03-16 MED ORDER — ALBUTEROL SULFATE HFA 108 (90 BASE) MCG/ACT IN AERS
INHALATION_SPRAY | RESPIRATORY_TRACT | Status: DC | PRN
  Administered 2024-03-16: 2 via RESPIRATORY_TRACT

## 2024-03-16 MED ORDER — LIDOCAINE HCL (PF) 2 % IJ SOLN
INTRAMUSCULAR | Status: DC | PRN
  Administered 2024-03-16: 80 mg via INTRADERMAL

## 2024-03-16 MED ORDER — FENTANYL CITRATE PF 50 MCG/ML IJ SOSY
PREFILLED_SYRINGE | INTRAMUSCULAR | Status: AC
  Filled 2024-03-16: qty 2

## 2024-03-16 MED ORDER — ONDANSETRON HCL 4 MG/2ML IJ SOLN
INTRAMUSCULAR | Status: AC
  Filled 2024-03-16: qty 2

## 2024-03-16 MED ORDER — PHENYLEPHRINE 80 MCG/ML (10ML) SYRINGE FOR IV PUSH (FOR BLOOD PRESSURE SUPPORT)
PREFILLED_SYRINGE | INTRAVENOUS | Status: AC
  Filled 2024-03-16: qty 10

## 2024-03-16 MED ORDER — FENTANYL CITRATE (PF) 100 MCG/2ML IJ SOLN
INTRAMUSCULAR | Status: AC
  Filled 2024-03-16: qty 2

## 2024-03-16 MED ORDER — MIDAZOLAM HCL 2 MG/2ML IJ SOLN
INTRAMUSCULAR | Status: AC
  Filled 2024-03-16: qty 2

## 2024-03-16 MED ORDER — ACETAMINOPHEN 500 MG PO TABS
1000.0000 mg | ORAL_TABLET | Freq: Once | ORAL | Status: AC
  Administered 2024-03-16: 1000 mg via ORAL
  Filled 2024-03-16: qty 2

## 2024-03-16 SURGICAL SUPPLY — 46 items
BAG COUNTER SPONGE SURGICOUNT (BAG) IMPLANT
BLADE SURG 15 STRL LF DISP TIS (BLADE) IMPLANT
BNDG COHESIVE 4X5 TAN STRL LF (GAUZE/BANDAGES/DRESSINGS) IMPLANT
BNDG COHESIVE 6X5 TAN ST LF (GAUZE/BANDAGES/DRESSINGS) IMPLANT
BNDG COMPR ESMARK 4X3 LF (GAUZE/BANDAGES/DRESSINGS) IMPLANT
BNDG GAUZE DERMACEA FLUFF 4 (GAUZE/BANDAGES/DRESSINGS) IMPLANT
COVER MAYO STAND STRL (DRAPES) ×1 IMPLANT
COVER SURGICAL LIGHT HANDLE (MISCELLANEOUS) ×1 IMPLANT
DRAPE C-ARM 42X120 X-RAY (DRAPES) ×1 IMPLANT
DRAPE EXTREMITY T 121X128X90 (DISPOSABLE) IMPLANT
DRAPE ORTHO 2.5IN SPLIT 77X108 (DRAPES) IMPLANT
DRAPE TOP 10253 STERILE (DRAPES) IMPLANT
DRSG MEPILEX POST OP 4X8 (GAUZE/BANDAGES/DRESSINGS) IMPLANT
DURAPREP 26ML APPLICATOR (WOUND CARE) IMPLANT
ELECT REM PT RETURN 15FT ADLT (MISCELLANEOUS) IMPLANT
GAUZE PAD ABD 8X10 STRL (GAUZE/BANDAGES/DRESSINGS) IMPLANT
GAUZE SPONGE 4X4 12PLY STRL (GAUZE/BANDAGES/DRESSINGS) IMPLANT
GAUZE XEROFORM 1X8 LF (GAUZE/BANDAGES/DRESSINGS) IMPLANT
GLOVE BIO SURGEON STRL SZ8 (GLOVE) IMPLANT
GLOVE BIO SURGEON STRL SZ8.5 (GLOVE) ×1 IMPLANT
GLOVE BIOGEL PI IND STRL 7.0 (GLOVE) ×1 IMPLANT
GLOVE BIOGEL PI IND STRL 8 (GLOVE) IMPLANT
GLOVE SURG SYN 7.0 (GLOVE) IMPLANT
GLOVE SURG SYN 7.0 PF PI (GLOVE) ×1 IMPLANT
GOWN SRG XL LVL 4 BRTHBL STRL (GOWNS) ×1 IMPLANT
GOWN STRL REUS W/ TWL XL LVL3 (GOWN DISPOSABLE) IMPLANT
KIT BASIN OR (CUSTOM PROCEDURE TRAY) IMPLANT
KIT TURNOVER KIT A (KITS) IMPLANT
MAT PREVALON FULL STRYKER (MISCELLANEOUS) IMPLANT
NDL HYPO 22X1.5 SAFETY MO (MISCELLANEOUS) IMPLANT
NEEDLE HYPO 22X1.5 SAFETY MO (MISCELLANEOUS) ×1 IMPLANT
PACK GENERAL/GYN (CUSTOM PROCEDURE TRAY) ×1 IMPLANT
PACK ORTHO EXTREMITY (CUSTOM PROCEDURE TRAY) IMPLANT
PENCIL SMOKE EVACUATOR (MISCELLANEOUS) IMPLANT
PROTECTOR NERVE ULNAR (MISCELLANEOUS) ×1 IMPLANT
STOCKINETTE 8 INCH (MISCELLANEOUS) IMPLANT
SUCTION TUBE FRAZIER 10FR DISP (SUCTIONS) IMPLANT
SUT ETHILON 3 0 PS 1 (SUTURE) IMPLANT
SUT MNCRL AB 3-0 PS2 18 (SUTURE) ×1 IMPLANT
SUT VIC AB 0 CT1 27XBRD ANBCTR (SUTURE) IMPLANT
SUT VIC AB 1 CT1 36 (SUTURE) ×1 IMPLANT
SUT VIC AB 2-0 CT1 TAPERPNT 27 (SUTURE) ×1 IMPLANT
SUT VIC AB 2-0 FS1 27 (SUTURE) IMPLANT
SUT VIC AB 3-0 FS2 27 (SUTURE) IMPLANT
SYR 10ML LL (SYRINGE) IMPLANT
TUBING CONNECTING 10 (TUBING) IMPLANT

## 2024-03-16 NOTE — Interval H&P Note (Signed)
 History and Physical Interval Note:  03/16/2024 7:26 AM  Kristen [name]  has presented today for surgery, with the diagnosis of RIGHT ELBOW PAINFUL HARDWARE.  The various methods of treatment have been discussed with the patient and family. After consideration of risks, benefits and other options for treatment, the patient has consented to  Procedure(s) with comments: REMOVAL, HARDWARE (Right) - RIGHT ELBOW HARDEARE REMOVAL as a surgical intervention.  The patient's history has been reviewed, patient examined, no change in status, stable for surgery.  I have reviewed the patient's chart and labs.  Questions were answered to the patient's satisfaction.     Shanisha Lech G Hildred Mollica

## 2024-03-16 NOTE — Anesthesia Procedure Notes (Signed)
 Procedure Name: Intubation Date/Time: 03/16/2024 7:54 AM  Performed by: Manuela Sella, CRNAPre-anesthesia Checklist: Patient identified, Emergency Drugs available, Suction available and Patient being monitored Patient Re-evaluated:Patient Re-evaluated prior to induction Oxygen Delivery Method: Circle system utilized Preoxygenation: Pre-oxygenation with 100% oxygen Induction Type: IV induction Ventilation: Mask ventilation without difficulty Laryngoscope Size: Mac and 4 Grade View: Grade II Tube type: Oral Tube size: 7.5 mm Number of attempts: 1 Airway Equipment and Method: Stylet Placement Confirmation: ETT inserted through vocal cords under direct vision, positive ETCO2 and breath sounds checked- equal and bilateral Secured at: 22 cm Tube secured with: Tape Dental Injury: Teeth and Oropharynx as per pre-operative assessment

## 2024-03-16 NOTE — Anesthesia Postprocedure Evaluation (Signed)
 Anesthesia Post Note  Patient: Kristen [name]  Procedure(s) Performed: REMOVAL, HARDWARE (Right)     Patient location during evaluation: PACU Anesthesia Type: General Level of consciousness: awake and alert Pain management: pain level controlled Vital Signs Assessment: post-procedure vital signs reviewed and stable Respiratory status: spontaneous breathing, nonlabored ventilation and respiratory function stable Cardiovascular status: stable and blood pressure returned to baseline Anesthetic complications: no  No notable events documented.  Last Vitals:  Vitals:   03/16/24 0937 03/16/24 1020  BP: (!) 164/89 (!) 163/78  Pulse: 76 70  Resp: 15 16  Temp:  36.6 C  SpO2: 98% 96%    Last Pain:  Vitals:   03/16/24 1020  TempSrc: Oral  PainSc:                  Juventino Oppenheim

## 2024-03-16 NOTE — Brief Op Note (Signed)
 Kristen [name] [id number] 03/16/2024   PRE-OP DIAGNOSIS: painful hardware right elbow  POST-OP DIAGNOSIS: same  PROCEDURE: removal hardware right elbow  ANESTHESIA: general  Alphonzo Ask   Dictation #:  [id number]

## 2024-03-16 NOTE — Transfer of Care (Signed)
 Immediate Anesthesia Transfer of Care Note  Patient: Kristen [name]  Procedure(s) Performed: Procedure(s) with comments: REMOVAL, HARDWARE (Right) - RIGHT ELBOW HARDEARE REMOVAL  Patient Location: PACU  Anesthesia Type:General  Level of Consciousness: Patient easily awoken, comfortable, cooperative, following commands, responds to stimulation.   Airway & Oxygen Therapy: Patient spontaneously breathing, ventilating well, oxygen via simple oxygen mask.  Post-op Assessment: Report given to PACU RN, vital signs reviewed and stable, moving all extremities.   Post vital signs: Reviewed and stable.  Complications: No apparent anesthesia complications Last Vitals:  Vitals Value Taken Time  BP 160/65 03/16/24 0901  Temp    Pulse 79 03/16/24 0903  Resp 19 03/16/24 0903  SpO2 100 % 03/16/24 0903  Vitals shown include unfiled device data.  Last Pain:  Vitals:   03/16/24 0605  TempSrc:   PainSc: 7          Complications: No notable events documented.

## 2024-03-16 NOTE — Op Note (Signed)
 [name], [name] MEDICAL RECORD NO: [id number] ACCOUNT NO: [id number] DATE OF BIRTH: 16-Dec-1965 FACILITY: [facility] LOCATION: [facility] PHYSICIAN: Arthur Billing. Ana Balling, MD  Operative Report   DATE OF PROCEDURE: 03/16/2024  PREOPERATIVE DIAGNOSIS:  Painful hardware, right elbow.  POSTOPERATIVE DIAGNOSIS:  Painful hardware, right elbow.  PROCEDURE PERFORMED:  Removal of hardware, right elbow.  ATTENDING SURGEON:  Tanner Fanny, MD  ASSISTANT:  Alfonso Ike, PA  ANESTHESIA:  General.  ESTIMATED BLOOD LOSS:  Obtained from anesthesia records.  ACCURATE TOURNIQUET TIME:  Obtained from anesthesia records.  IV FLUIDS:  Obtained from anesthesia records.  FINDINGS:  The olecranon plate was removed without any difficulty.  The plate was not broken despite preoperative radiographic findings.  The fracture appeared healed.  INDICATIONS:  The patient is a 58 year old woman long out from right olecranon ORIF done by a different surgeon.  She has persisted with some pain about some prominent hardware and there is also some question of possible nonunion.  She is offered removal  of hardware.  Informed operative consent was obtained after discussion of possible complications including reaction to anesthesia, infection, nonunion, and DVT.  SUMMARY OF FINDINGS IN PROCEDURE:  Under general anesthesia through a portion of her old incision, an olecranon plate was removed without any difficulty. The plate was not broken despite preoperative radiographic findings.  The fracture appeared healed.  She was closed primarily.  [name] assisted throughout and was invaluable to the completion of the case mostly in that he helped retract and position this very large arm and closed simultaneously to help minimize OR time.  DESCRIPTION OF PROCEDURE: The patient was taken to the operating suite where general anesthetic was applied without significant difficulty.  She was positioned supine and prepped and draped  in normal sterile fashion.  After the administration of preop IV  Kefzol  and an appropriate timeout, the right arm was elevated, exsanguinated, and a tourniquet inflated about the upper arm.  I used a portion of her old posterior incision with dissection down to the plate.  We removed 6 screws and the plate without  incident.  The plate was intact and the fracture appeared healed.  I removed some prominent bone spurs followed by thorough irrigation.  We reapproximated the deep tissues with 0 Vicryl followed by subcutaneous reapproximation with 2-0 undyed Vicryl and  skin closure with interrupted nylon sutures.  The tourniquet was deflated and a small amount of bleeding was easily controlled with some pressure.  Her hand became pink and warm immediately.  Adaptic was applied followed by dry gauze and a loose Coban  wrap.  DISPOSITION:  The patient was extubated in the operating room and taken to the recovery room in stable condition.  Plans were for her to go home the same day and follow up in the office in less than a week.  I will contact her by phone tonight.    MUK D: 03/16/2024 8:35:49 am T: 03/16/2024 9:11:00 am  JOB: [id number]/ [id number]

## 2024-03-17 ENCOUNTER — Encounter (HOSPITAL_COMMUNITY): Payer: Self-pay | Admitting: Orthopaedic Surgery

## 2024-03-24 DIAGNOSIS — M25521 Pain in right elbow: Secondary | ICD-10-CM | POA: Diagnosis not present

## 2024-03-30 ENCOUNTER — Ambulatory Visit: Payer: Self-pay | Admitting: Student

## 2024-03-30 ENCOUNTER — Ambulatory Visit

## 2024-03-30 DIAGNOSIS — Z9359 Other cystostomy status: Secondary | ICD-10-CM | POA: Diagnosis not present

## 2024-03-30 DIAGNOSIS — Z466 Encounter for fitting and adjustment of urinary device: Secondary | ICD-10-CM | POA: Diagnosis not present

## 2024-03-30 DIAGNOSIS — S52021A Displaced fracture of olecranon process without intraarticular extension of right ulna, initial encounter for closed fracture: Secondary | ICD-10-CM | POA: Diagnosis not present

## 2024-03-31 ENCOUNTER — Ambulatory Visit
Admission: RE | Admit: 2024-03-31 | Discharge: 2024-03-31 | Disposition: A | Discharge status: Home / Self Care | Source: Ambulatory Visit | Attending: Family Medicine | Admitting: Family Medicine

## 2024-03-31 DIAGNOSIS — Z1231 Encounter for screening mammogram for malignant neoplasm of breast: Secondary | ICD-10-CM

## 2024-04-02 ENCOUNTER — Other Ambulatory Visit: Payer: Self-pay

## 2024-04-02 ENCOUNTER — Encounter (HOSPITAL_COMMUNITY): Payer: Self-pay | Admitting: Student

## 2024-04-02 NOTE — Progress Notes (Signed)
 PCP - Jimmey Mould, MD  EKG - 03/11/24 Stress Test - 04/12/16 ECHO - 03/22/17  CPAP - Does not wear it  Fasting Blood Sugar - 140-170 Checks Blood Sugar 2/day  Blood Thinner Instructions: Stop Eliquis  two days prior to procedure Aspirin  Instructions: Stop two days prior to procedure  Anesthesia review: N  Patient verbally denies any shortness of breath, fever, cough and chest pain during phone call   -------------  SDW INSTRUCTIONS given:  Your procedure is scheduled on Monday, June 16th.  Report to [name] Main Entrance A at 0530 A.M., and check in at the Admitting office.  Call this number if you have problems the morning of surgery:  [phone]   Remember:  Do not eat after midnight the night before your surgery  You may drink clear liquids until 0430 the morning of your surgery.   Clear liquids allowed are: Water, Non-Citrus Juices (without pulp), Carbonated Beverages, Clear Tea, Black Coffee Only, and Gatorade    Take these medicines the morning of surgery with A SIP OF WATER  buPROPion (WELLBUTRIN XL)  famotidine (PEPCID)  Fluticasone-Salmeterol (ADVAIR)  tiZANidine  (ZANAFLEX )  acetaminophen  (TYLENOL )-if needed albuterol  (VENTOLIN  HFA)-if needed (please bring on the day of surgery) oxyCODONE -acetaminophen  (PERCOCET)-if needed promethazine  (PHENERGAN )-if needed  Novolog : Take normal dose the day before surgery Lantus : Take 50% of your normal dose the night before surgery. Only take half of your normal dose the day of surgery IF your blood sugar is ABOVE 220  ** PLEASE check your blood sugar the morning of your surgery when you wake up and every 2 hours until you get to the Short Stay unit.  If your blood sugar is less than 70 mg/dL, you will need to treat for low blood sugar: Do not take insulin . Treat a low blood sugar (less than 70 mg/dL) with  cup of clear juice (cranberry or apple), 4 glucose tablets, OR glucose gel. Recheck blood sugar in 15  minutes after treatment (to make sure it is greater than 70 mg/dL). If your blood sugar is not greater than 70 mg/dL on recheck, call [phone] for further instructions.    As of today, STOP taking any Aspirin  (unless otherwise instructed by your surgeon) Aleve, Naproxen, Ibuprofen , Motrin , Advil , Goody's, BC's, all herbal medications, fish oil, and all vitamins.                      Do not wear jewelry, make up, or nail polish            Do not wear lotions, powders, perfumes/colognes, or deodorant.            Do not shave 48 hours prior to surgery.  Men may shave face and neck.            Do not bring valuables to the hospital.            [facility] is not responsible for any belongings or valuables.  Do NOT Smoke (Tobacco/Vaping) 24 hours prior to your procedure If you use a CPAP at night, you may bring all equipment for your overnight stay.   Contacts, glasses, dentures or bridgework may not be worn into surgery.      For patients admitted to the hospital, discharge time will be determined by your treatment team.   Patients discharged the day of surgery will not be allowed to drive home, and someone needs to stay with them for 24 hours.    Special instructions:  [place]- Preparing For Surgery  Before surgery, you can play an important role. Because skin is not sterile, your skin needs to be as free of germs as possible. You can reduce the number of germs on your skin by washing with CHG (chlorahexidine gluconate) Soap before surgery.  CHG is an antiseptic cleaner which kills germs and bonds with the skin to continue killing germs even after washing.    Oral Hygiene is also important to reduce your risk of infection.  Remember - BRUSH YOUR TEETH THE MORNING OF SURGERY WITH YOUR REGULAR TOOTHPASTE  Please do not use if you have an allergy to CHG or antibacterial soaps. If your skin becomes reddened/irritated stop using the CHG.  Do not shave (including legs and underarms)  for at least 48 hours prior to first CHG shower. It is OK to shave your face.  Please follow these instructions carefully.   Shower the NIGHT BEFORE SURGERY and the MORNING OF SURGERY with DIAL Soap.   Pat yourself dry with a CLEAN TOWEL.  Wear CLEAN PAJAMAS to bed the night before surgery  Place CLEAN SHEETS on your bed the night of your first shower and DO NOT SLEEP WITH PETS.   Day of Surgery: Please shower morning of surgery  Wear Clean/Comfortable clothing the morning of surgery Do not apply any deodorants/lotions.   Remember to brush your teeth WITH YOUR REGULAR TOOTHPASTE.   Questions were answered. Patient verbalized understanding of instructions.

## 2024-04-04 NOTE — H&P (Signed)
 [facility] (OTS) H&P  Patient ID: Kristen [name] MRN: [id number] DOB/AGE: 1965-11-28 58 y.o.  Reason for surgery: Right elbow/olecranon fracture  HPI: Kristen [name] is a 58 y.o. female with PMH significant for diabetes, history of PE, CKD, depression, hypertension, anxiety presenting for surgery on right elbow.  Patient underwent ORIF of right elbow by Dr. Hosea Mac in December 2023.  Since surgery she had noted that she was unable to lie her arm on any structure due to discomfort.  The hardware was prominent.  She subsequently underwent hardware removal in May 2025 by Dr. Dalldorf.  Since hard removal, patient unfortunately has sustained a fall and has sustained a new fracture to the right elbow/olecranon.  She presents now for surgical fixation. Patient ambulates at baseline with no assistive device.  Is on Eliquis  due to history of PE.  Last dose 04/03/2024.  Patient is on insulin  for diabetes, last hemoglobin A1c 7.8.  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    Suprapubic catheter [facility])    Past Surgical History:  Procedure Laterality Date   ABDOMINAL SURGERY     hernia repair   CHOLECYSTECTOMY  [id number]   COLONOSCOPY WITH PROPOFOL  N/A 07/31/2016   Procedure: COLONOSCOPY WITH PROPOFOL ;  Surgeon: Evangeline Hilts, MD;  Location: [facility] ENDOSCOPY;  Service: Endoscopy;  Laterality: N/A;   COLONOSCOPY WITH PROPOFOL  N/A 03/08/2020   Procedure: COLONOSCOPY WITH PROPOFOL ;  Surgeon: Evangeline Hilts, MD;  Location: [facility] ENDOSCOPY;  Service: Endoscopy;  Laterality: N/A;   FOOT SURGERY Right 06/2007   HARDWARE REMOVAL Right 03/16/2024   Procedure: REMOVAL, HARDWARE;  Surgeon: Dayne Even, MD;  Location: [facility] ORS;  Service: Orthopedics;  Laterality: Right;  RIGHT ELBOW HARDEARE REMOVAL   IR RADIOLOGIST EVAL & MGMT  12/14/2019   KNEE ARTHROSCOPY  09/2008   left   KNEE ARTHROSCOPY  april 2011   left   NASAL SINUS SURGERY     NASAL SINUS SURGERY  1990   ORIF ELBOW FRACTURE Right 10/15/2022   Procedure: OPEN REDUCTION INTERNAL FIXATION (ORIF) OLECRANON FRACTURE;  Surgeon: Bettyjane Brunet, MD;  Location: [facility] OR;  Service: Orthopedics;  Laterality: Right;   POLYPECTOMY  03/08/2020   Procedure: POLYPECTOMY;  Surgeon: Evangeline Hilts, MD;  Location: [facility] ENDOSCOPY;  Service: Endoscopy;;   SHOULDER SURGERY  2004   left   Family History  Problem Relation Age of Onset   Cancer Mother        lung   Cancer Father    Diabetes Sister    Breast cancer Paternal Aunt    Hypertension Maternal Grandmother    Stroke Maternal Grandmother    Diabetes Maternal Grandfather    Heart attack Neg Hx     Social History:  reports that she has never smoked. She has never used  smokeless tobacco. She reports that she does not drink alcohol  and does not use drugs.  Allergies:  Allergies  Allergen Reactions   Cafergot Other (See Comments)    Chest pain; ergotamine-caffiene   Glucophage [Metformin Hcl] Anaphylaxis   Metformin Anaphylaxis   Ergotamine-Caffeine Other (See Comments)    Chest pain   Invokana [Canagliflozin] Swelling and Other (See Comments)    Legs Swell invokana Legs Swell    Medications: Prior to Admission medications   Medication Sig Start Date End Date Taking? Authorizing Provider  acetaminophen  (TYLENOL ) 500 MG tablet Take 1,000 mg by mouth every 6 (six) hours as needed for moderate pain.    [provider]  albuterol  (VENTOLIN  HFA) 108 (90 Base) MCG/ACT inhaler Inhale 2  puffs into the lungs every 4 (four) hours as needed for wheezing or shortness of breath.  01/04/14   [provider]  apixaban  (ELIQUIS ) 5 MG TABS tablet Take 5 mg by mouth 2 (two) times daily.    [provider]  aspirin  EC 81 MG tablet Take 81 mg by mouth at bedtime.    [provider]  atorvastatin  (LIPITOR) 40 MG tablet Take 40 mg by mouth in the morning.    [provider]  buPROPion (WELLBUTRIN XL) 300 MG 24 hr tablet Take 300 mg by mouth every morning. 09/13/19   [provider]  Cholecalciferol  (VITAMIN D3 PO) Take 5,000 Units by mouth in the morning.    [provider]  docusate sodium  (COLACE) 100 MG capsule Take 300 mg by mouth at bedtime.    [provider]  famotidine (PEPCID) 20 MG tablet Take 40 mg by mouth daily as needed for heartburn or indigestion.    [provider]  Fluticasone-Salmeterol (ADVAIR) 500-50 MCG/DOSE AEPB Inhale 1 puff into the lungs every 12 (twelve) hours.    [provider]  insulin  aspart (NOVOLOG  FLEXPEN) 100 UNIT/ML FlexPen Inject 60 Units into the skin 3 (three) times daily with meals.    [provider]  LANTUS  SOLOSTAR 100 UNIT/ML Solostar Pen Inject 120 Units into the skin at bedtime.    [provider]  lisinopril  (ZESTRIL ) 20 MG tablet Take 20 mg by mouth at bedtime.    [provider]  montelukast  (SINGULAIR ) 10 MG tablet Take 10 mg by mouth at bedtime.    [provider]  nitrofurantoin (MACRODANTIN) 50 MG capsule Take 50 mg by mouth daily.    [provider]  oxyCODONE -acetaminophen  (PERCOCET) 10-325 MG tablet Take 1 tablet by mouth every 6 (six) hours as needed for pain. 02/16/24   Jodi Munroe, NP  polyethylene glycol (MIRALAX  / GLYCOLAX ) 17 g packet Take 17 g by mouth daily as needed (constipation.).    [provider]  promethazine  (PHENERGAN ) 25 MG tablet Take 25 mg by mouth every 6 (six) hours as needed for  nausea or vomiting.     [provider]  tiZANidine  (ZANAFLEX ) 2 MG tablet Take 1 tablet (2 mg total) by mouth in the morning, at noon, and at bedtime. 08/15/23   Rawland Caddy, MD   I have reviewed the patient's current medications.  Positive ROS: All other systems have been reviewed and were otherwise negative with the exception of those mentioned in the HPI and as above.  Exam: Weight (!) 174 kg. General: Alert and oriented, no acute distress Cardiovascular: No pedal edema Respiratory: No cyanosis, no use of accessory musculature GI: No organomegaly, abdomen is soft and non-tender Skin: No  lesions in the area of chief complaint Neurologic: Sensation intact distally Psychiatric: Patient is competent for consent with normal mood and affect  Musculoskeletal: Right upper extremity: Some swelling and bruising over the elbow.  Tenderness with palpation over the olecranon.  Pain with elbow motion.  Nontender through the forearm wrist, hand.  Nontender through the upper arm or shoulder.  Tolerates gentle shoulder and wrist ROM.  Motor and sensory function intact to the median, ulnar, radial nerve distributions.  Neurovascularly intact  Left upper extremity: Skin without lesions. No tenderness to palpation. Full painless ROM, full strength in each muscle group without evidence of instability. Motor/sensory function at baseline. Neurovascularly intact.   Medical Decision Making: Data: Imaging: AP and lateral views of the right elbow show acute olecranon fracture  Labs: No results found for this or any previous visit (from the past 24 hours).   Medical history and chart was reviewed and case discussed with attending provider.  Assessment/Plan: 58 year old female s/p hardware removal right elbow 03/16/2024, now presenting with acute right olecranon fracture   After full discussion of all treatment options, including risks and benefits of both operative versus nonoperative  management, patient has elected to proceed with operative management of her olecranon fracture.  Risks and benefits of the procedure were discussed with the patient. Risks discussed included bleeding, infection, malunion, nonunion, damage to surrounding nerves and blood vessels, pain, hardware prominence or irritation, hardware failure, stiffness, post-traumatic arthritis, DVT/PE, compartment syndrome, and even anesthesia complications.  Patient states understanding of these risks and agrees to proceed with surgery.  Consent will be obtained.  We will plan to discharge patient home from the PACU postoperatively.  She will be nonweightbearing to the right upper extremity postoperatively.  Will plan to have her restart her home dose Eliquis  on postoperative day #1.   Edilia Gordon PA-C [facility] [phone] (office) [email or web address]

## 2024-04-05 ENCOUNTER — Ambulatory Visit (HOSPITAL_COMMUNITY): Payer: Self-pay | Admitting: Anesthesiology

## 2024-04-05 ENCOUNTER — Ambulatory Visit (HOSPITAL_COMMUNITY)

## 2024-04-05 ENCOUNTER — Ambulatory Visit (HOSPITAL_BASED_OUTPATIENT_CLINIC_OR_DEPARTMENT_OTHER): Payer: Self-pay | Admitting: Anesthesiology

## 2024-04-05 ENCOUNTER — Encounter (HOSPITAL_COMMUNITY)
Admission: RE | Disposition: A | Discharge status: Home / Self Care | Payer: Self-pay | Source: Home / Self Care | Attending: Student

## 2024-04-05 ENCOUNTER — Ambulatory Visit (HOSPITAL_COMMUNITY)
Admission: RE | Admit: 2024-04-05 | Discharge: 2024-04-05 | Disposition: A | Discharge status: Home / Self Care | Attending: Student | Admitting: Student

## 2024-04-05 ENCOUNTER — Other Ambulatory Visit: Payer: Self-pay

## 2024-04-05 ENCOUNTER — Encounter (HOSPITAL_COMMUNITY): Payer: Self-pay | Admitting: Student

## 2024-04-05 DIAGNOSIS — S42401A Unspecified fracture of lower end of right humerus, initial encounter for closed fracture: Secondary | ICD-10-CM | POA: Diagnosis not present

## 2024-04-05 DIAGNOSIS — Z7982 Long term (current) use of aspirin: Secondary | ICD-10-CM | POA: Insufficient documentation

## 2024-04-05 DIAGNOSIS — W19XXXA Unspecified fall, initial encounter: Secondary | ICD-10-CM | POA: Insufficient documentation

## 2024-04-05 DIAGNOSIS — E1122 Type 2 diabetes mellitus with diabetic chronic kidney disease: Secondary | ICD-10-CM

## 2024-04-05 DIAGNOSIS — Z794 Long term (current) use of insulin: Secondary | ICD-10-CM | POA: Insufficient documentation

## 2024-04-05 DIAGNOSIS — M1990 Unspecified osteoarthritis, unspecified site: Secondary | ICD-10-CM | POA: Diagnosis not present

## 2024-04-05 DIAGNOSIS — J45909 Unspecified asthma, uncomplicated: Secondary | ICD-10-CM | POA: Insufficient documentation

## 2024-04-05 DIAGNOSIS — Z86711 Personal history of pulmonary embolism: Secondary | ICD-10-CM | POA: Diagnosis not present

## 2024-04-05 DIAGNOSIS — Z7951 Long term (current) use of inhaled steroids: Secondary | ICD-10-CM | POA: Diagnosis not present

## 2024-04-05 DIAGNOSIS — F419 Anxiety disorder, unspecified: Secondary | ICD-10-CM | POA: Insufficient documentation

## 2024-04-05 DIAGNOSIS — H5461 Unqualified visual loss, right eye, normal vision left eye: Secondary | ICD-10-CM | POA: Insufficient documentation

## 2024-04-05 DIAGNOSIS — X501XXA Overexertion from prolonged static or awkward postures, initial encounter: Secondary | ICD-10-CM | POA: Diagnosis not present

## 2024-04-05 DIAGNOSIS — R519 Headache, unspecified: Secondary | ICD-10-CM | POA: Diagnosis not present

## 2024-04-05 DIAGNOSIS — E6689 Other obesity not elsewhere classified: Secondary | ICD-10-CM | POA: Diagnosis not present

## 2024-04-05 DIAGNOSIS — F32A Depression, unspecified: Secondary | ICD-10-CM | POA: Diagnosis not present

## 2024-04-05 DIAGNOSIS — Z79899 Other long term (current) drug therapy: Secondary | ICD-10-CM | POA: Insufficient documentation

## 2024-04-05 DIAGNOSIS — G4730 Sleep apnea, unspecified: Secondary | ICD-10-CM | POA: Diagnosis not present

## 2024-04-05 DIAGNOSIS — S52021A Displaced fracture of olecranon process without intraarticular extension of right ulna, initial encounter for closed fracture: Secondary | ICD-10-CM | POA: Diagnosis not present

## 2024-04-05 DIAGNOSIS — I129 Hypertensive chronic kidney disease with stage 1 through stage 4 chronic kidney disease, or unspecified chronic kidney disease: Secondary | ICD-10-CM

## 2024-04-05 DIAGNOSIS — Z7901 Long term (current) use of anticoagulants: Secondary | ICD-10-CM | POA: Insufficient documentation

## 2024-04-05 DIAGNOSIS — Z6841 Body Mass Index (BMI) 40.0 and over, adult: Secondary | ICD-10-CM | POA: Insufficient documentation

## 2024-04-05 DIAGNOSIS — S52301K Unspecified fracture of shaft of right radius, subsequent encounter for closed fracture with nonunion: Secondary | ICD-10-CM | POA: Diagnosis not present

## 2024-04-05 DIAGNOSIS — Z472 Encounter for removal of internal fixation device: Secondary | ICD-10-CM | POA: Diagnosis not present

## 2024-04-05 DIAGNOSIS — Z8673 Personal history of transient ischemic attack (TIA), and cerebral infarction without residual deficits: Secondary | ICD-10-CM | POA: Diagnosis not present

## 2024-04-05 DIAGNOSIS — S52201A Unspecified fracture of shaft of right ulna, initial encounter for closed fracture: Secondary | ICD-10-CM | POA: Diagnosis not present

## 2024-04-05 DIAGNOSIS — N183 Chronic kidney disease, stage 3 unspecified: Secondary | ICD-10-CM | POA: Diagnosis not present

## 2024-04-05 DIAGNOSIS — S52001K Unspecified fracture of upper end of right ulna, subsequent encounter for closed fracture with nonunion: Secondary | ICD-10-CM | POA: Diagnosis not present

## 2024-04-05 DIAGNOSIS — Z833 Family history of diabetes mellitus: Secondary | ICD-10-CM | POA: Diagnosis not present

## 2024-04-05 DIAGNOSIS — G8918 Other acute postprocedural pain: Secondary | ICD-10-CM | POA: Diagnosis not present

## 2024-04-05 HISTORY — DX: Blindness, one eye, unspecified eye: H54.40

## 2024-04-05 HISTORY — PX: ORIF ELBOW FRACTURE: SHX5031

## 2024-04-05 HISTORY — DX: Other cystostomy status: Z93.59

## 2024-04-05 LAB — GLUCOSE, CAPILLARY
Glucose-Capillary: 258 mg/dL — ABNORMAL HIGH (ref 70–99)
Glucose-Capillary: 224 mg/dL — ABNORMAL HIGH (ref 70–99)
Glucose-Capillary: 211 mg/dL — ABNORMAL HIGH (ref 70–99)

## 2024-04-05 SURGERY — OPEN REDUCTION INTERNAL FIXATION (ORIF) ELBOW/OLECRANON FRACTURE
Anesthesia: General | Site: Elbow | Laterality: Right

## 2024-04-05 MED ORDER — 0.9 % SODIUM CHLORIDE (POUR BTL) OPTIME
TOPICAL | Status: DC | PRN
  Administered 2024-04-05: 1000 mL

## 2024-04-05 MED ORDER — CHLORHEXIDINE GLUCONATE 0.12 % MT SOLN
15.0000 mL | OROMUCOSAL | Status: AC
  Administered 2024-04-05: 15 mL via OROMUCOSAL
  Filled 2024-04-05 (×2): qty 15

## 2024-04-05 MED ORDER — PROPOFOL 10 MG/ML IV BOLUS
INTRAVENOUS | Status: AC
  Filled 2024-04-05: qty 20

## 2024-04-05 MED ORDER — OXYCODONE HCL 5 MG PO TABS: 5.0000 mg | ORAL_TABLET | Freq: Once | ORAL | Status: DC | PRN

## 2024-04-05 MED ORDER — ONDANSETRON HCL 4 MG/2ML IJ SOLN
INTRAMUSCULAR | Status: DC | PRN
  Administered 2024-04-05: 4 mg via INTRAVENOUS

## 2024-04-05 MED ORDER — CEFAZOLIN SODIUM-DEXTROSE 3-4 GM/150ML-% IV SOLN
3.0000 g | INTRAVENOUS | Status: AC
  Administered 2024-04-05: 3 g via INTRAVENOUS
  Filled 2024-04-05: qty 150

## 2024-04-05 MED ORDER — ALBUTEROL SULFATE HFA 108 (90 BASE) MCG/ACT IN AERS
INHALATION_SPRAY | RESPIRATORY_TRACT | Status: DC | PRN
Start: 2024-04-05 — End: 2024-04-05
  Administered 2024-04-05: 4 via RESPIRATORY_TRACT

## 2024-04-05 MED ORDER — FENTANYL CITRATE (PF) 250 MCG/5ML IJ SOLN
INTRAMUSCULAR | Status: AC
  Filled 2024-04-05: qty 5

## 2024-04-05 MED ORDER — VANCOMYCIN HCL 1000 MG IV SOLR
INTRAVENOUS | Status: DC | PRN
  Administered 2024-04-05: 1000 mg

## 2024-04-05 MED ORDER — MIDAZOLAM HCL 2 MG/2ML IJ SOLN
INTRAMUSCULAR | Status: AC
  Filled 2024-04-05: qty 2

## 2024-04-05 MED ORDER — INSULIN ASPART 100 UNIT/ML IJ SOLN
INTRAMUSCULAR | Status: AC
  Filled 2024-04-05: qty 6

## 2024-04-05 MED ORDER — PROPOFOL 10 MG/ML IV BOLUS
INTRAVENOUS | Status: DC | PRN
  Administered 2024-04-05: 120 mg via INTRAVENOUS

## 2024-04-05 MED ORDER — MIDAZOLAM HCL 2 MG/2ML IJ SOLN
INTRAMUSCULAR | Status: DC | PRN
  Administered 2024-04-05: 2 mg via INTRAVENOUS

## 2024-04-05 MED ORDER — SUGAMMADEX SODIUM 200 MG/2ML IV SOLN
INTRAVENOUS | Status: DC | PRN
  Administered 2024-04-05: 348.4 mg via INTRAVENOUS

## 2024-04-05 MED ORDER — VANCOMYCIN HCL 1000 MG IV SOLR
INTRAVENOUS | Status: AC
  Filled 2024-04-05: qty 20

## 2024-04-05 MED ORDER — LACTATED RINGERS IV SOLN: INTRAVENOUS | Status: DC | PRN

## 2024-04-05 MED ORDER — SUCCINYLCHOLINE CHLORIDE 200 MG/10ML IV SOSY
PREFILLED_SYRINGE | INTRAVENOUS | Status: DC | PRN
  Administered 2024-04-05: 140 mg via INTRAVENOUS

## 2024-04-05 MED ORDER — FENTANYL CITRATE (PF) 250 MCG/5ML IJ SOLN
INTRAMUSCULAR | Status: DC | PRN
  Administered 2024-04-05 (×3): 25 ug via INTRAVENOUS

## 2024-04-05 MED ORDER — LIDOCAINE 2% (20 MG/ML) 5 ML SYRINGE
INTRAMUSCULAR | Status: DC | PRN
  Administered 2024-04-05: 40 mg via INTRAVENOUS

## 2024-04-05 MED ORDER — INSULIN ASPART 100 UNIT/ML IJ SOLN
0.0000 [IU] | INTRAMUSCULAR | Status: AC | PRN
  Administered 2024-04-05 (×2): 6 [IU] via SUBCUTANEOUS

## 2024-04-05 MED ORDER — ROPIVACAINE HCL 5 MG/ML IJ SOLN
INTRAMUSCULAR | Status: DC | PRN
Start: 2024-04-05 — End: 2024-04-05
  Administered 2024-04-05: 30 mL via PERINEURAL

## 2024-04-05 MED ORDER — OXYCODONE HCL 5 MG/5ML PO SOLN: 5.0000 mg | Freq: Once | ORAL | Status: DC | PRN

## 2024-04-05 MED ORDER — SODIUM CHLORIDE 0.9 % IV SOLN: 12.5000 mg | INTRAVENOUS | Status: DC | PRN

## 2024-04-05 MED ORDER — DEXAMETHASONE SODIUM PHOSPHATE 10 MG/ML IJ SOLN
INTRAMUSCULAR | Status: DC | PRN
  Administered 2024-04-05: 10 mg via INTRAVENOUS

## 2024-04-05 MED ORDER — PHENYLEPHRINE 80 MCG/ML (10ML) SYRINGE FOR IV PUSH (FOR BLOOD PRESSURE SUPPORT)
PREFILLED_SYRINGE | INTRAVENOUS | Status: DC | PRN
  Administered 2024-04-05 (×2): 160 ug via INTRAVENOUS

## 2024-04-05 MED ORDER — AMISULPRIDE (ANTIEMETIC) 5 MG/2ML IV SOLN: 10.0000 mg | Freq: Once | INTRAVENOUS | Status: DC | PRN

## 2024-04-05 MED ORDER — ROCURONIUM BROMIDE 10 MG/ML (PF) SYRINGE
PREFILLED_SYRINGE | INTRAVENOUS | Status: DC | PRN
  Administered 2024-04-05: 80 mg via INTRAVENOUS

## 2024-04-05 MED ORDER — HYDROMORPHONE HCL 1 MG/ML IJ SOLN: 0.2500 mg | INTRAMUSCULAR | Status: DC | PRN

## 2024-04-05 SURGICAL SUPPLY — 67 items
BAG COUNTER SPONGE SURGICOUNT (BAG) ×1 IMPLANT
BENZOIN TINCTURE PRP APPL 2/3 (GAUZE/BANDAGES/DRESSINGS) IMPLANT
BIT DRILL 3.2 QUICK MINI 300 (DRILL) IMPLANT
BIT DRILL 5.0 QC 6.5 (BIT) IMPLANT
BLADE AVERAGE 25X9 (BLADE) IMPLANT
BLADE CLIPPER SURG (BLADE) ×1 IMPLANT
BLADE SURG 10 STRL SS (BLADE) IMPLANT
BNDG COHESIVE 4X5 TAN STRL LF (GAUZE/BANDAGES/DRESSINGS) ×1 IMPLANT
BNDG COMPR ESMARK 4X3 LF (GAUZE/BANDAGES/DRESSINGS) IMPLANT
BNDG ELASTIC 3INX 5YD STR LF (GAUZE/BANDAGES/DRESSINGS) ×1 IMPLANT
BNDG ELASTIC 4INX 5YD STR LF (GAUZE/BANDAGES/DRESSINGS) IMPLANT
BNDG ELASTIC 4X5.8 VLCR STR LF (GAUZE/BANDAGES/DRESSINGS) ×1 IMPLANT
BNDG ELASTIC 6INX 5YD STR LF (GAUZE/BANDAGES/DRESSINGS) ×1 IMPLANT
BNDG GAUZE DERMACEA FLUFF 4 (GAUZE/BANDAGES/DRESSINGS) ×1 IMPLANT
BRUSH SCRUB EZ PLAIN DRY (MISCELLANEOUS) ×2 IMPLANT
CHLORAPREP W/TINT 26 (MISCELLANEOUS) ×1 IMPLANT
CLEANER TIP ELECTROSURG 2X2 (MISCELLANEOUS) ×1 IMPLANT
COVER SURGICAL LIGHT HANDLE (MISCELLANEOUS) ×2 IMPLANT
CUFF TOURN SGL QUICK 18X4 (TOURNIQUET CUFF) IMPLANT
CUFF TRNQT CYL 24X4X16.5-23 (TOURNIQUET CUFF) IMPLANT
DRAPE C-ARM 42X72 X-RAY (DRAPES) ×1 IMPLANT
DRAPE C-ARMOR (DRAPES) ×1 IMPLANT
DRAPE INCISE IOBAN 66X45 STRL (DRAPES) IMPLANT
DRAPE SURG ORHT 6 SPLT 77X108 (DRAPES) ×2 IMPLANT
DRAPE U-SHAPE 47X51 STRL (DRAPES) ×1 IMPLANT
DRSG ADAPTIC 3X8 NADH LF (GAUZE/BANDAGES/DRESSINGS) IMPLANT
DRSG MEPILEX POST OP 4X8 (GAUZE/BANDAGES/DRESSINGS) IMPLANT
ELECTRODE REM PT RTRN 9FT ADLT (ELECTROSURGICAL) ×1 IMPLANT
GAUZE SPONGE 4X4 12PLY STRL (GAUZE/BANDAGES/DRESSINGS) ×1 IMPLANT
GAUZE XEROFORM 5X9 LF (GAUZE/BANDAGES/DRESSINGS) ×1 IMPLANT
GLOVE BIO SURGEON STRL SZ 6.5 (GLOVE) ×3 IMPLANT
GLOVE BIO SURGEON STRL SZ7.5 (GLOVE) ×4 IMPLANT
GLOVE BIOGEL PI IND STRL 6.5 (GLOVE) ×1 IMPLANT
GLOVE BIOGEL PI IND STRL 7.5 (GLOVE) ×1 IMPLANT
GOWN STRL REUS W/ TWL LRG LVL3 (GOWN DISPOSABLE) ×2 IMPLANT
KIT BASIN OR (CUSTOM PROCEDURE TRAY) ×1 IMPLANT
KIT TURNOVER KIT B (KITS) ×1 IMPLANT
MANIFOLD NEPTUNE II (INSTRUMENTS) ×1 IMPLANT
NS IRRIG 1000ML POUR BTL (IV SOLUTION) ×1 IMPLANT
PACK ORTHO EXTREMITY (CUSTOM PROCEDURE TRAY) ×1 IMPLANT
PAD ARMBOARD POSITIONER FOAM (MISCELLANEOUS) ×2 IMPLANT
PAD CAST 4YDX4 CTTN HI CHSV (CAST SUPPLIES) ×1 IMPLANT
PAD CAST CTTN 4X4 STRL (SOFTGOODS) IMPLANT
PADDING CAST SYNTHETIC 3X4 NS (CAST SUPPLIES) IMPLANT
SCREW CANN LRG PT SD 6.5X120 (Screw) IMPLANT
SLING ARM FOAM STRAP XLG (SOFTGOODS) IMPLANT
SPIKE FLUID TRANSFER (MISCELLANEOUS) IMPLANT
SPONGE T-LAP 18X18 ~~LOC~~+RFID (SPONGE) ×2 IMPLANT
STAPLER SKIN PROX 35W (STAPLE) ×1 IMPLANT
STRIP CLOSURE SKIN 1/2X4 (GAUZE/BANDAGES/DRESSINGS) IMPLANT
SUCTION TUBE FRAZIER 10FR DISP (SUCTIONS) ×1 IMPLANT
SUT ETHILON 3 0 PS 1 (SUTURE) IMPLANT
SUT MNCRL AB 3-0 PS2 18 (SUTURE) ×1 IMPLANT
SUT MON AB 2-0 CT1 36 (SUTURE) ×1 IMPLANT
SUT PDS AB 2-0 CT1 27 (SUTURE) IMPLANT
SUT PROLENE 3 0 PS 2 (SUTURE) ×2 IMPLANT
SUT VIC AB 0 CT1 27XBRD ANBCTR (SUTURE) ×2 IMPLANT
SUT VIC AB 2-0 CT1 TAPERPNT 27 (SUTURE) ×2 IMPLANT
SUT VIC AB 2-0 CT3 27 (SUTURE) IMPLANT
SYR CONTROL 10ML LL (SYRINGE) ×1 IMPLANT
TOWEL GREEN STERILE (TOWEL DISPOSABLE) ×2 IMPLANT
TOWEL GREEN STERILE FF (TOWEL DISPOSABLE) IMPLANT
TUBE CONNECTING 12X1/4 (SUCTIONS) ×1 IMPLANT
UNDERPAD 30X36 HEAVY ABSORB (UNDERPADS AND DIAPERS) ×1 IMPLANT
WASHER CANN 12.7 STRL (Washer) IMPLANT
WATER STERILE IRR 1000ML POUR (IV SOLUTION) ×1 IMPLANT
YANKAUER SUCT BULB TIP NO VENT (SUCTIONS) ×1 IMPLANT

## 2024-04-05 NOTE — Interval H&P Note (Signed)
 History and Physical Interval Note:  04/05/2024 7:17 AM  Kristen [name]  has presented today for surgery, with the diagnosis of Right olecranon fracture.  The various methods of treatment have been discussed with the patient and family. After consideration of risks, benefits and other options for treatment, the patient has consented to  Procedure(s): OPEN REDUCTION INTERNAL FIXATION (ORIF) ELBOW/OLECRANON FRACTURE (Right) as a surgical intervention.  The patient's history has been reviewed, patient examined, no change in status, stable for surgery.  I have reviewed the patient's chart and labs.  Questions were answered to the patient's satisfaction.     Arlett Goold P Beaux Wedemeyer

## 2024-04-05 NOTE — Anesthesia Procedure Notes (Signed)
 Procedure Name: Intubation Date/Time: 04/05/2024 7:41 AM  Performed by: Andee Bamberger, CRNAPre-anesthesia Checklist: Patient identified, Emergency Drugs available, Suction available and Patient being monitored Patient Re-evaluated:Patient Re-evaluated prior to induction Oxygen Delivery Method: Circle system utilized Preoxygenation: Pre-oxygenation with 100% oxygen Induction Type: IV induction Laryngoscope Size: Mac and 4 Grade View: Grade II Tube type: Oral Tube size: 7.5 mm Number of attempts: 1 Airway Equipment and Method: Stylet and Oral airway Placement Confirmation: ETT inserted through vocal cords under direct vision, positive ETCO2 and breath sounds checked- equal and bilateral Secured at: 22 cm Tube secured with: Tape Dental Injury: Teeth and Oropharynx as per pre-operative assessment

## 2024-04-05 NOTE — Transfer of Care (Signed)
 Immediate Anesthesia Transfer of Care Note  Patient: Kristen [name]  Procedure(s) Performed: OPEN REDUCTION INTERNAL FIXATION (ORIF) ELBOW/OLECRANON FRACTURE (Right: Elbow)  Patient Location: PACU  Anesthesia Type:General and Regional  Level of Consciousness: awake, alert , and oriented  Airway & Oxygen Therapy: Patient Spontanous Breathing and Patient connected to face mask oxygen  Post-op Assessment: Report given to RN and Post -op Vital signs reviewed and stable  Post vital signs: Reviewed and stable  Last Vitals:  Vitals Value Taken Time  BP 163/76 04/05/24 08:39  Temp    Pulse 75 04/05/24 08:44  Resp 22 04/05/24 08:44  SpO2 100 % 04/05/24 08:44  Vitals shown include unfiled device data.  Last Pain:  Vitals:   04/05/24 0559  TempSrc: Oral  PainSc:       Patients Stated Pain Goal: 3 (04/05/24 0555)  Complications: No notable events documented.

## 2024-04-05 NOTE — Anesthesia Preprocedure Evaluation (Signed)
 Anesthesia Evaluation  Patient identified by MRN, date of birth, ID band Patient awake    Reviewed: Allergy & Precautions, NPO status , Patient's Chart, lab work & pertinent test results  History of Anesthesia Complications Negative for: history of anesthetic complications  Airway Mallampati: III  TM Distance: >3 FB Neck ROM: Full    Dental  (+) Dental Advisory Given, Missing   Pulmonary asthma , sleep apnea (noncompliant) , PE   Pulmonary exam normal        Cardiovascular hypertension, Pt. on medications Normal cardiovascular exam     Neuro/Psych  Headaches PSYCHIATRIC DISORDERS Anxiety Depression    CVA (optic nerve), No Residual Symptoms    GI/Hepatic negative GI ROS, Neg liver ROS,,,  Endo/Other  diabetes, Type 2, Insulin  Dependent  Class 4 obesity  Renal/GU CRFRenal disease     Musculoskeletal  (+) Arthritis ,    Abdominal  (+) + obese  Peds  Hematology  On eliquis     Anesthesia Other Findings   Reproductive/Obstetrics  PCOS                              Anesthesia Physical Anesthesia Plan  ASA: 4  Anesthesia Plan: General   Post-op Pain Management: Regional block* and Minimal or no pain anticipated   Induction: Intravenous  PONV Risk Score and Plan: 3 and Treatment may vary due to age or medical condition, Ondansetron , Dexamethasone  and Midazolam   Airway Management Planned: Oral ETT  Additional Equipment: None  Intra-op Plan:   Post-operative Plan: Extubation in OR  Informed Consent: I have reviewed the patients History and Physical, chart, labs and discussed the procedure including the risks, benefits and alternatives for the proposed anesthesia with the patient or authorized representative who has indicated his/her understanding and acceptance.     Dental advisory given  Plan Discussed with: CRNA and Anesthesiologist  Anesthesia Plan Comments:         Anesthesia Quick Evaluation

## 2024-04-05 NOTE — Discharge Instructions (Addendum)
 [facility] Discharge Instructions   General Discharge Instructions  WEIGHT BEARING STATUS:Non-weightbearing right upper extremity  RANGE OF MOTION/ACTIVITY: Ok for unrestricted elbow range of motion. Use sling as needed for comfort  Wound Care: You may remove your surgical dressing on post op day 3 (Thursday 04/08/24). Incisions can be left open to air if there is no drainage. Once the incision is completely dry and without drainage, it may be left open to air out.  Showering may begin post op day 4 (Friday 04/09/24).  Clean incision gently with soap and water.  DVT/PE prophylaxis: Resume your home dose Eliquis  morning of Tuesday 04/06/24  Diet: as you were eating previously.  Can use over the counter stool softeners and bowel preparations, such as Miralax , to help with bowel movements.  Narcotics can be constipating.  Be sure to drink plenty of fluids  PAIN MEDICATION USE AND EXPECTATIONS  You have likely been given narcotic medications to help control your pain.  After a traumatic event that results in an fracture (broken bone) with or without surgery, it is ok to use narcotic pain medications to help control one's pain.  We understand that everyone responds to pain differently and each individual patient will be evaluated on a regular basis for the continued need for narcotic medications. Ideally, narcotic medication use should last no more than 6-8 weeks (coinciding with fracture healing).   As a patient it is your responsibility as well to monitor narcotic medication use and report the amount and frequency you use these medications when you come to your office visit.   We would also advise that if you are using narcotic medications, you should take a dose prior to therapy to maximize you participation.  IF YOU ARE ON NARCOTIC MEDICATIONS IT IS NOT PERMISSIBLE TO OPERATE A MOTOR VEHICLE (MOTORCYCLE/CAR/TRUCK/MOPED) OR HEAVY MACHINERY DO NOT MIX NARCOTICS WITH OTHER CNS  (CENTRAL NERVOUS SYSTEM) DEPRESSANTS SUCH AS ALCOHOL   POST-OPERATIVE OPIOID TAPER INSTRUCTIONS: It is important to wean off of your opioid medication as soon as possible. If you do not need pain medication after your surgery it is ok to stop day one. Opioids include: Codeine, Hydrocodone (Norco, Vicodin), Oxycodone (Percocet, oxycontin ) and hydromorphone  amongst others.  Long term and even short term use of opiods can cause: Increased pain response Dependence Constipation Depression Respiratory depression And more.  Withdrawal symptoms can include Flu like symptoms Nausea, vomiting And more Techniques to manage these symptoms Hydrate well Eat regular healthy meals Stay active Use relaxation techniques(deep breathing, meditating, yoga) Do Not substitute Alcohol  to help with tapering If you have been on opioids for less than two weeks and do not have pain than it is ok to stop all together.  Plan to wean off of opioids This plan should start within one week post op of your fracture surgery  Maintain the same interval or time between taking each dose and first decrease the dose.  Cut the total daily intake of opioids by one tablet each day Next start to increase the time between doses. The last dose that should be eliminated is the evening dose.    STOP SMOKING OR USING NICOTINE PRODUCTS!!!!  As discussed nicotine severely impairs your body's ability to heal surgical and traumatic wounds but also impairs bone healing.  Wounds and bone heal by forming microscopic blood vessels (angiogenesis) and nicotine is a vasoconstrictor (essentially, shrinks blood vessels).  Therefore, if vasoconstriction occurs to these microscopic blood vessels they essentially disappear and are unable to deliver necessary  nutrients to the healing tissue.  This is one modifiable factor that you can do to dramatically increase your chances of healing your injury.  (This means no smoking, no nicotine gum, patches,  etc)  DO NOT USE NONSTEROIDAL ANTI-INFLAMMATORY DRUGS (NSAID'S)  Using products such as Advil  (ibuprofen ), Aleve (naproxen), Motrin  (ibuprofen ) for additional pain control during fracture healing can delay and/or prevent the healing response.  If you would like to take over the counter (OTC) medication, Tylenol  (acetaminophen ) is ok.  However, some narcotic medications that are given for pain control contain acetaminophen  as well. Therefore, you should not exceed more than 4000 mg of tylenol  in a day if you do not have liver disease.  Also note that there are may OTC medicines, such as cold medicines and allergy medicines that my contain tylenol  as well.  If you have any questions about medications and/or interactions please ask your doctor/PA or your pharmacist.      ICE AND ELEVATE INJURED/OPERATIVE EXTREMITY  Using ice and elevating the injured extremity above your heart can help with swelling and pain control.  Icing in a pulsatile fashion, such as 20 minutes on and 20 minutes off, can be followed.    Do not place ice directly on skin. Make sure there is a barrier between to skin and the ice pack.    Using frozen items such as frozen peas works well as the conform nicely to the are that needs to be iced.  USE AN ACE WRAP OR TED HOSE FOR SWELLING CONTROL  In addition to icing and elevation, Ace wraps or TED hose are used to help limit and resolve swelling.  It is recommended to use Ace wraps or TED hose until you are informed to stop.    When using Ace Wraps start the wrapping distally (farthest away from the body) and wrap proximally (closer to the body)   Example: If you had surgery on your leg or thing and you do not have a splint on, start the ace wrap at the toes and work your way up to the thigh        If you had surgery on your upper extremity and do not have a splint on, start the ace wrap at your fingers and work your way up to the upper arm   CALL THE OFFICE FOR MEDICATION REFILLS OR  WITH ANY QUESTIONS/CONCERNS: [phone]   VISIT OUR WEBSITE FOR ADDITIONAL INFORMATION: [email or web address]   Discharge Wound Care Instructions  Do NOT apply any ointments, solutions or lotions to pin sites or surgical wounds.  These prevent needed drainage and even though solutions like hydrogen peroxide kill bacteria, they also damage cells lining the pin sites that help fight infection.  Applying lotions or ointments can keep the wounds moist and can cause them to breakdown and open up as well. This can increase the risk for infection. When in doubt call the office.  Surgical incisions should be dressed daily.  If any drainage is noted, use one layer of adaptic or Mepitel, then gauze, Kerlix, and an ace wrap. - These dressing supplies should be available at local medical supply stores ([facility], [facility], etc) as well as Insurance claims handler ([facility], [facility], [place], etc)  Once the incision is completely dry and without drainage, it may be left open to air out.  Showering may begin 36-48 hours later.  Cleaning gently with soap and water.  Traumatic wounds should be dressed daily as well.    One layer of  adaptic, gauze, Kerlix, then ace wrap.  The adaptic can be discontinued once the draining has ceased    If you have a wet to dry dressing: wet the gauze with saline the squeeze as much saline out so the gauze is moist (not soaking wet), place moistened gauze over wound, then place a dry gauze over the moist one, followed by Kerlix wrap, then ace wrap.    Call office for the following: Temperature greater than 101F Persistent nausea and vomiting Severe uncontrolled pain Redness, tenderness, or signs of infection (pain, swelling, redness, odor or green/yellow discharge around the site) Difficulty breathing, headache or visual disturbances Hives Persistent dizziness or light-headedness Extreme fatigue Any other questions or concerns you may have after discharge  In an  emergency, call 911 or go to an Emergency Department at a nearby hospital  OTHER HELPFUL INFORMATION  If you had a block, it will wear off between 8-24 hrs postop typically.  This is period when your pain may go from nearly zero to the pain you would have had postop without the block.  This is an abrupt transition but nothing dangerous is happening.  You may take an extra dose of narcotic when this happens.  You should wean off your narcotic medicines as soon as you are able.  Most patients will be off or using minimal narcotics before their first postop appointment.   We suggest you use the pain medication the first night prior to going to bed, in order to ease any pain when the anesthesia wears off. You should avoid taking pain medications on an empty stomach as it will make you nauseous.  Do not drink alcoholic beverages or take illicit drugs when taking pain medications.  In most states it is against the law to drive while you are in a splint or sling.  And certainly against the law to drive while taking narcotics.  You may return to work/school in the next couple of days when you feel up to it.   Pain medication may make you constipated.  Below are a few solutions to try in this order: Decrease the amount of pain medication if you aren't having pain. Drink lots of decaffeinated fluids. Drink prune juice and/or each dried prunes  If the first 3 don't work start with additional solutions Take Colace - an over-the-counter stool softener Take Senokot - an over-the-counter laxative Take Miralax  - a stronger over-the-counter laxative

## 2024-04-05 NOTE — Anesthesia Postprocedure Evaluation (Signed)
 Anesthesia Post Note  Patient: Pernella Ackerley  Procedure(s) Performed: OPEN REDUCTION INTERNAL FIXATION (ORIF) ELBOW/OLECRANON FRACTURE (Right: Elbow)     Patient location during evaluation: PACU Anesthesia Type: General Level of consciousness: awake and alert Pain management: pain level controlled Vital Signs Assessment: post-procedure vital signs reviewed and stable Respiratory status: spontaneous breathing, nonlabored ventilation and respiratory function stable Cardiovascular status: blood pressure returned to baseline and stable Postop Assessment: no apparent nausea or vomiting Anesthetic complications: no   No notable events documented.  Last Vitals:  Vitals:   04/05/24 0900 04/05/24 0915  BP: (!) 177/75 (!) 145/54  Pulse: 71 69  Resp: 20 18  Temp:    SpO2: 94% 95%    Last Pain:  Vitals:   04/05/24 0840  TempSrc:   PainSc: Asleep                 Earvin Goldberg

## 2024-04-05 NOTE — Op Note (Signed)
 Orthopaedic Surgery Operative Note (CSN: [id number] ) Date of Surgery: 04/05/2024  Admit Date: 04/05/2024   Diagnoses: Pre-Op Diagnoses: Right olecranon nonunion/refracture  Post-Op Diagnosis: Same  Procedures: CPT 25400-Repair of right proximal ulna/olecranon nonunion  Surgeons : Primary: Laneta Pintos, MD  Assistant: Alona Jamaica, PA-C  Location: OR 3   Anesthesia: General   Antibiotics: Ancef  3g preop with 1 gm vancomycin  powder placed topically  Tourniquet time: None    Estimated Blood Loss: Minimal  Complications:* No complications entered in OR log *   Specimens:* No specimens in log *   Implants: Implant Name Type Inv. Item Serial No. Manufacturer Lot No. LRB No. Used Action  WASHER CANN 12.7 STRL - [id number] Washer WASHER CANN 12.7 STRL  [employer] [id number] Right 1 Implanted  6.5x120 46mm pt screw    [employer]  Right 1 Implanted     Indications for Surgery: 58 year old female who sustained a right olecranon fracture that underwent open reduction internal fixation and 2023.  She subsequently developed pain earlier this year and underwent hardware removal.  Appeared to be healed according through Dr. Dalldorf.  Unfortunately she got her arm twisted and felt a pop.  She refracture through her olecranon.  Due to the unstable nature of her injury I recommend proceeding with open reduction internal fixation.  Risks and benefits were discussed with the patient and her sister.  Risks include but not limited to bleeding, infection, malunion, nonunion, hardware failure, hardware irritation, nerve and blood vessel injury, need for further surgeries, and the possibility of anesthetic complications.  They agreed to proceed with surgery and consent was obtained.  Operative Findings: Repair of right proximal ulna nonunion using [employer] 6.5 mm partially-threaded cannulated screw with a washer  Procedure: The patient was identified in  the preoperative holding area. Consent was confirmed with the patient and their family and all questions were answered. The operative extremity was marked after confirmation with the patient. she was then brought back to the operating room by our anesthesia colleagues.  She was placed under general anesthetic and carefully transferred over to radiolucent flattop table.  Her right upper extremity was then prepped and draped in usual sterile fashion.  A timeout was performed to verify the patient, procedure, and the extremity.  Preoperative antibiotics were dosed.  Fluoroscopic imaging showed the unstable nature of her injury.  A incision through the previous scar was made and carried down through skin subcutaneous tissue.  I exposed the fracture site and cleaned out some of the soft tissue and debrided back to healthy cancellous bone.  And I used one of the screw holes and used a reduction tenaculum to anatomically reduce the olecranon.  I confirmed anatomic reduction with fluoroscopy.  I then placed a guidewire down the center of the canal.  I then measured the length and decided to use 120 mm 6.5 mm partially-threaded cannulated screw with a washer.  This was placed.  Excellent fixation was obtained and I got excellent compression at the fracture site.  There is no instability of the elbow.  Final fluoroscopic imaging was obtained.  The incision was copiously irrigated.  A gram vancomycin  powder was placed to the incision.  A layered closure of 0 Vicryl, 2-0 Monocryl and 3-0 nylon was used to close the skin.  Sterile dressings were applied.  The patient was then awoke from anesthesia and taken to the PACU in stable condition.  Post Op Plan/Instructions: The patient will be  nonweightbearing to the right upper extremity.  She will have unrestricted range of motion of the elbow.  She will be discharged home from the PACU.  Will have her follow-up in approximately 2 weeks for x-rays and wound check and suture  removal.  She will be on her home dose of Eliquis  for DVT prophylaxis.  I was present and performed the entire surgery.  Alona Jamaica, PA-C did assist me throughout the case. An assistant was necessary given the difficulty in approach, maintenance of reduction and ability to instrument the fracture.   Katheryne Pane, MD [facility]

## 2024-04-05 NOTE — Anesthesia Procedure Notes (Signed)
 Anesthesia Regional Block: Supraclavicular block   Pre-Anesthetic Checklist: , timeout performed,  Correct Patient, Correct Site, Correct Laterality,  Correct Procedure, Correct Position, site marked,  Risks and benefits discussed,  Surgical consent,  Pre-op evaluation,  At surgeon's request and post-op pain management  Laterality: Right  Prep: chloraprep       Needles:  Injection technique: Single-shot  Needle Type: Stimiplex     Needle Length: 9cm  Needle Gauge: 21     Additional Needles:   Procedures:,,,, ultrasound used (permanent image in chart),,    Narrative:  Start time: 04/05/2024 7:07 AM End time: 04/05/2024 7:12 AM Injection made incrementally with aspirations every 5 mL.  Performed by: Personally  Anesthesiologist: Earvin Goldberg, MD

## 2024-04-06 ENCOUNTER — Encounter (HOSPITAL_COMMUNITY): Payer: Self-pay | Admitting: Student

## 2024-04-06 DIAGNOSIS — R339 Retention of urine, unspecified: Secondary | ICD-10-CM | POA: Diagnosis not present

## 2024-04-19 ENCOUNTER — Ambulatory Visit: Admitting: Registered Nurse

## 2024-04-20 DIAGNOSIS — S52301K Unspecified fracture of shaft of right radius, subsequent encounter for closed fracture with nonunion: Secondary | ICD-10-CM | POA: Diagnosis not present

## 2024-04-21 ENCOUNTER — Encounter: Payer: Self-pay | Admitting: Registered Nurse

## 2024-04-21 ENCOUNTER — Encounter: Attending: Registered Nurse | Admitting: Registered Nurse

## 2024-04-21 VITALS — BP 145/73 | HR 88 | Ht 68.0 in | Wt 399.6 lb

## 2024-04-21 DIAGNOSIS — M1711 Unilateral primary osteoarthritis, right knee: Secondary | ICD-10-CM | POA: Insufficient documentation

## 2024-04-21 DIAGNOSIS — M170 Bilateral primary osteoarthritis of knee: Secondary | ICD-10-CM | POA: Diagnosis not present

## 2024-04-21 DIAGNOSIS — M47816 Spondylosis without myelopathy or radiculopathy, lumbar region: Secondary | ICD-10-CM | POA: Insufficient documentation

## 2024-04-21 DIAGNOSIS — M25521 Pain in right elbow: Secondary | ICD-10-CM | POA: Insufficient documentation

## 2024-04-21 DIAGNOSIS — M1712 Unilateral primary osteoarthritis, left knee: Secondary | ICD-10-CM | POA: Diagnosis present

## 2024-04-21 DIAGNOSIS — Z79891 Long term (current) use of opiate analgesic: Secondary | ICD-10-CM | POA: Insufficient documentation

## 2024-04-21 DIAGNOSIS — G894 Chronic pain syndrome: Secondary | ICD-10-CM | POA: Insufficient documentation

## 2024-04-21 DIAGNOSIS — Z5181 Encounter for therapeutic drug level monitoring: Secondary | ICD-10-CM | POA: Insufficient documentation

## 2024-04-21 DIAGNOSIS — M7918 Myalgia, other site: Secondary | ICD-10-CM | POA: Insufficient documentation

## 2024-04-21 DIAGNOSIS — Z466 Encounter for fitting and adjustment of urinary device: Secondary | ICD-10-CM | POA: Diagnosis not present

## 2024-04-21 DIAGNOSIS — E1122 Type 2 diabetes mellitus with diabetic chronic kidney disease: Secondary | ICD-10-CM | POA: Diagnosis not present

## 2024-04-21 DIAGNOSIS — Z6841 Body Mass Index (BMI) 40.0 and over, adult: Secondary | ICD-10-CM | POA: Diagnosis not present

## 2024-04-21 DIAGNOSIS — Z9359 Other cystostomy status: Secondary | ICD-10-CM | POA: Diagnosis not present

## 2024-04-21 MED ORDER — OXYCODONE-ACETAMINOPHEN 10-325 MG PO TABS
1.0000 | ORAL_TABLET | Freq: Four times a day (QID) | ORAL | 0 refills | Status: DC | PRN
Start: 2024-04-21 — End: 2024-04-21

## 2024-04-21 MED ORDER — OXYCODONE-ACETAMINOPHEN 10-325 MG PO TABS
1.0000 | ORAL_TABLET | Freq: Four times a day (QID) | ORAL | 0 refills | Status: DC | PRN
Start: 2024-04-21 — End: 2024-06-23

## 2024-04-21 NOTE — Progress Notes (Signed)
 Subjective:    Patient ID: Kristen [name], female    DOB: 11-22-1965, 58 y.o.   MRN: [id number]  HPI: Kristen [name] is a 58 y.o. female who returns for follow up appointment for chronic pain and medication refill. She states her pain is located in her right elbow, post-op pain and lower back pain. She rates her pain 7. Her current exercise regime is walking and performing stretching exercises.  [name] Morphine  equivalent is 60.00 MME.   Last Oral Swab was Performed on 02/16/2024, it was consistent.    Pain Inventory Average Pain 7 Pain Right Now 7 My pain is sharp, burning, stabbing, tingling, and aching  In the last 24 hours, has pain interfered with the following? General activity 10 Relation with others 10 Enjoyment of life 10 What TIME of day is your pain at its worst? morning , daytime, and evening Sleep (in general) Poor  Pain is worse with: walking, standing, and some activites Pain improves with: rest, heat/ice, and medication Relief from Meds: 7  Family History  Problem Relation Age of Onset  . Cancer Mother        lung  . Cancer Father   . Diabetes Sister   . Breast cancer Paternal Aunt   . Hypertension Maternal Grandmother   . Stroke Maternal Grandmother   . Diabetes Maternal Grandfather   . Heart attack Neg Hx    Social History   Socioeconomic History  . Marital status: Single    Spouse name: Not on file  . Number of children: Not on file  . Years of education: Not on file  . Highest education level: Not on file  Occupational History  . Not on file  Tobacco Use  . Smoking status: Never  . Smokeless tobacco: Never  Vaping Use  . Vaping status: Never Used  Substance and Sexual Activity  . Alcohol  use: No    Alcohol /week: 0.0 standard drinks of alcohol   . Drug use: No  . Sexual activity: Not Currently    Comment: 1st intercourse 22 yo-1 partner  Other Topics Concern  . Not on file  Social History Narrative  . Not on file   Social  Drivers of Health   Financial Resource Strain: Not on file  Food Insecurity: Not on file  Transportation Needs: Not on file  Physical Activity: Not on file  Stress: Not on file  Social Connections: Not on file   Past Surgical History:  Procedure Laterality Date  . ABDOMINAL SURGERY     hernia repair  . CHOLECYSTECTOMY  [id number]  . COLONOSCOPY WITH PROPOFOL  N/A 07/31/2016   Procedure: COLONOSCOPY WITH PROPOFOL ;  Surgeon: Elsie Cree, MD;  Location: [facility];  Service: Endoscopy;  Laterality: N/A;  . COLONOSCOPY WITH PROPOFOL  N/A 03/08/2020   Procedure: COLONOSCOPY WITH PROPOFOL ;  Surgeon: Cree Elsie, MD;  Location: [facility];  Service: Endoscopy;  Laterality: N/A;  . FOOT SURGERY Right 06/2007  . HARDWARE REMOVAL Right 03/16/2024   Procedure: REMOVAL, HARDWARE;  Surgeon: Sheril Coy, MD;  Location: [facility];  Service: Orthopedics;  Laterality: Right;  RIGHT ELBOW HARDEARE REMOVAL  . IR RADIOLOGIST EVAL & MGMT  12/14/2019  . KNEE ARTHROSCOPY  09/2008   left  . KNEE ARTHROSCOPY  april 2011   left  . NASAL SINUS SURGERY    . NASAL SINUS SURGERY  1990  . ORIF ELBOW FRACTURE Right 10/15/2022   Procedure: OPEN REDUCTION INTERNAL FIXATION (ORIF) OLECRANON FRACTURE;  Surgeon: Doll Skates, MD;  Location: [facility];  Service: Orthopedics;  Laterality: Right;  . ORIF ELBOW FRACTURE Right 04/05/2024   Procedure: OPEN REDUCTION INTERNAL FIXATION (ORIF) ELBOW/OLECRANON FRACTURE;  Surgeon: Kendal Franky SQUIBB, MD;  Location: [facility];  Service: Orthopedics;  Laterality: Right;  . POLYPECTOMY  03/08/2020   Procedure: POLYPECTOMY;  Surgeon: Burnette Fallow, MD;  Location: [facility];  Service: Endoscopy;;  . SHOULDER SURGERY  2004   left   Past Surgical History:  Procedure Laterality Date  . ABDOMINAL SURGERY     hernia repair  . CHOLECYSTECTOMY  [id number]  . COLONOSCOPY WITH PROPOFOL  N/A 07/31/2016   Procedure: COLONOSCOPY WITH PROPOFOL ;  Surgeon: Fallow Burnette, MD;  [facility]: [facility];  Service: Endoscopy;  Laterality: N/A;  . COLONOSCOPY WITH PROPOFOL  N/A 03/08/2020   Procedure: COLONOSCOPY WITH PROPOFOL ;  Surgeon: Burnette Fallow, MD;  [facility]: [facility];  Service: Endoscopy;  Laterality: N/A;  . FOOT SURGERY Right 06/2007  . HARDWARE REMOVAL Right 03/16/2024   Procedure: REMOVAL, HARDWARE;  Surgeon: Sheril Coy, MD;  Location: [facility];  Service: Orthopedics;  Laterality: Right;  RIGHT ELBOW HARDEARE REMOVAL  . IR RADIOLOGIST EVAL & MGMT  12/14/2019  . KNEE ARTHROSCOPY  09/2008   left  . KNEE ARTHROSCOPY  april 2011   left  . NASAL SINUS SURGERY    . NASAL SINUS SURGERY  1990  . ORIF ELBOW FRACTURE Right 10/15/2022   Procedure: OPEN REDUCTION INTERNAL FIXATION (ORIF) OLECRANON FRACTURE;  Surgeon: Doll Skates, MD;  Location: [facility];  Service: Orthopedics;  Laterality: Right;  . ORIF ELBOW FRACTURE Right 04/05/2024   Procedure: OPEN REDUCTION INTERNAL FIXATION (ORIF) ELBOW/OLECRANON FRACTURE;  Surgeon: Kendal Franky SQUIBB, MD;  Location: [facility];  Service: Orthopedics;  Laterality: Right;  . POLYPECTOMY  03/08/2020   Procedure: POLYPECTOMY;  Surgeon: Burnette Fallow, MD;  Location: [facility];  Service: Endoscopy;;  . SHOULDER SURGERY  2004   left   Past Medical History:  Diagnosis Date  . Anxiety   . Asthma   . 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 Chronic kidney disease    Stage 3  . 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 Depression   . Diabetes mellitus   . Dyspnea   . History of pulmonary embolus (PE)   . Hypertension   . Migraines   . Near syncope   . Obesity   . PCOS (polycystic ovarian syndrome)   . Peripheral edema   . Sleep apnea    uses cpap set on 10. Does not wear anymore 03/2024  . 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 Suprapubic catheter (HCC)    BP (!) 145/73 (BP Location: Left Arm, Patient Position: Sitting, Cuff Size: Large) Comment: second BP reading  Pulse 88   Ht 5' 8  (1.727 m)   Wt (!) 399 lb 9.6 oz (181.3 kg)   LMP  (LMP Unknown)   SpO2 98%   BMI 60.76 kg/m   Opioid Risk Score:   Fall Risk Score:  `1  Depression screen [name] 2/9     04/21/2024   10:25 AM 12/17/2023   11:16 AM 08/20/2023    2:56 PM 07/09/2023   12:02 PM 03/05/2023   11:51 AM 01/03/2023   12:01 PM 11/07/2022    1:20 PM  Depression screen PHQ 2/9  Decreased Interest 1 1 0 0 3 3 1   Down, Depressed, Hopeless 0 1 0  0 1 3 1   PHQ - 2 Score 1 2 0 0 4 6 2       Review of Systems  Musculoskeletal:  Positive for back pain, joint swelling and myalgias.       Back pain, right arm pain with joint pain  All other systems reviewed and are negative.      Objective:   Physical Exam Vitals and nursing note reviewed.  Constitutional:      Appearance: Normal appearance. She is obese.  Cardiovascular:     Rate and Rhythm: Normal rate and regular rhythm.     Pulses: Normal pulses.     Heart sounds: Normal heart sounds.  Pulmonary:     Effort: Pulmonary effort is normal.     Breath sounds: Normal breath sounds.  Genitourinary:    Comments: Supra Pubic Cath: Amber urine with sediment/ Urology Following.  Musculoskeletal:     Comments: Normal Muscle Bulk and Muscle Testing Reveals:  Upper Extremities: Full ROM and Muscle Strength 5/5  Lumbar Paraspinal : L-4-L-5  Lower Extremities: Full ROM and Muscle Strength 5/5 Bilateral Lower Extremity Flexion Produces Pain into her Bilateral Patella's Arises from Table slowly, using cane for support Narrow based  Gait     Skin:    General: Skin is warm and dry.  Neurological:     Mental Status: She is alert and oriented to person, place, and time.  Psychiatric:        Mood and Affect: Mood normal.        Behavior: Behavior normal.          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 Continue to monitor. 04/21/2024 Refilled: Oxycodone  10/325 mg one tablet every 6  hours as needed #120.  Second script e-scribe for the following month. We will continue the opioid monitoring program, this consists of regular clinic visits, examinations, urine drug screen, pill counts as well as use of [place]  Controlled Substance Reporting system. A 12 month History has been reviewed on the [place]  Controlled Substance Reporting System on 04/21/2024. 2. Morbid obesity: Continue Healthy Diet Regime and HEP. 07/022025 3. Type II diabetes:  Dr. Faythe following. 04/21/2024 4. Reactive Depression: No complaints . Continue to monitor.  04/21/2024. 5. Myofascial Muscle Pain: Continue current medication regimen with Tizanidine  . 04/21/2024 6. Bilateral  Chronic Knee Pain: Continue HEP as Tolerated. Continue to Monitor. 04/21/2024 7. Right Greater Trochanter Bursitis:  No complaints today. Continue to alternate Ice and Heat Therapy. Continue to monitor. 04/21/2024     F/U in 2 months

## 2024-04-27 DIAGNOSIS — H4720 Unspecified optic atrophy: Secondary | ICD-10-CM | POA: Diagnosis not present

## 2024-04-27 DIAGNOSIS — H4612 Retrobulbar neuritis, left eye: Secondary | ICD-10-CM | POA: Diagnosis not present

## 2024-05-11 DIAGNOSIS — Z466 Encounter for fitting and adjustment of urinary device: Secondary | ICD-10-CM | POA: Diagnosis not present

## 2024-05-18 DIAGNOSIS — S52301K Unspecified fracture of shaft of right radius, subsequent encounter for closed fracture with nonunion: Secondary | ICD-10-CM | POA: Diagnosis not present

## 2024-06-01 DIAGNOSIS — D6859 Other primary thrombophilia: Secondary | ICD-10-CM | POA: Diagnosis not present

## 2024-06-01 DIAGNOSIS — J45909 Unspecified asthma, uncomplicated: Secondary | ICD-10-CM | POA: Diagnosis not present

## 2024-06-01 DIAGNOSIS — R059 Cough, unspecified: Secondary | ICD-10-CM | POA: Diagnosis not present

## 2024-06-01 DIAGNOSIS — Z86711 Personal history of pulmonary embolism: Secondary | ICD-10-CM | POA: Diagnosis not present

## 2024-06-01 DIAGNOSIS — N183 Chronic kidney disease, stage 3 unspecified: Secondary | ICD-10-CM | POA: Diagnosis not present

## 2024-06-05 ENCOUNTER — Other Ambulatory Visit: Payer: Self-pay | Admitting: Physical Medicine & Rehabilitation

## 2024-06-05 DIAGNOSIS — M47816 Spondylosis without myelopathy or radiculopathy, lumbar region: Secondary | ICD-10-CM

## 2024-06-05 DIAGNOSIS — M546 Pain in thoracic spine: Secondary | ICD-10-CM

## 2024-06-09 DIAGNOSIS — N200 Calculus of kidney: Secondary | ICD-10-CM | POA: Diagnosis not present

## 2024-06-18 DIAGNOSIS — R339 Retention of urine, unspecified: Secondary | ICD-10-CM | POA: Diagnosis not present

## 2024-06-22 NOTE — Progress Notes (Signed)
 Subjective:    Patient ID: Kristen [name], female    DOB: 1966-04-15, 58 y.o.   MRN: [id number]  HPI: Kristen [name] is a 58 y.o. female who returns for follow up appointment for chronic pain and medication refill. She states her pain is located in her lower back and bilateral knee pain. She rates her pain 7. Her current exercise regime is walking, using her peddler daily for 30 minutes  and performing stretching exercises.  [name] Morphine  equivalent is 60.00 MME.   Oral Swab was Ordered today.      Pain Inventory Average Pain 7 Pain Right Now 7 My pain is intermittent, constant, sharp, and stabbing  In the last 24 hours, has pain interfered with the following? General activity 7 Relation with others 7 Enjoyment of life 7 What TIME of day is your pain at its worst? morning , daytime, and night Sleep (in general) Poor  Pain is worse with: walking, bending, standing, and some activites Pain improves with: rest, heat/ice, and medication Relief from Meds: 6  Family History  Problem Relation Age of Onset   Cancer Mother        lung   Cancer Father    Diabetes Sister    Breast cancer Paternal Aunt    Hypertension Maternal Grandmother    Stroke Maternal Grandmother    Diabetes Maternal Grandfather    Heart attack Neg Hx    Social History   Socioeconomic History   Marital status: Single    Spouse name: Not on file   Number of children: Not on file   Years of education: Not on file   Highest education level: Not on file  Occupational History   Not on file  Tobacco Use   Smoking status: Never   Smokeless tobacco: Never  Vaping Use   Vaping status: Never Used  Substance and Sexual Activity   Alcohol  use: No    Alcohol /week: 0.0 standard drinks of alcohol    Drug use: No   Sexual activity: Not Currently    Comment: 1st intercourse 60 yo-1 partner  Other Topics Concern   Not on file  Social History Narrative   Not on file   Social Drivers of Health    Financial Resource Strain: Not on file  Food Insecurity: Low Risk  (06/01/2024)   Received from [facility]   Hunger Vital Sign    Within the past 12 months, you worried that your food would run out before you got money to buy more: Never true    Within the past 12 months, the food you bought just didn't last and you didn't have money to get more. : Never true  Transportation Needs: No Transportation Needs (06/01/2024)   Received from [facility]    In the past 12 months, has lack of reliable transportation kept you from medical appointments, meetings, work or from getting things needed for daily living? : No  Physical Activity: Not on file  Stress: Not on file  Social Connections: Not on file   Past Surgical History:  Procedure Laterality Date   ABDOMINAL SURGERY     hernia repair   CHOLECYSTECTOMY  [id number]   COLONOSCOPY WITH PROPOFOL  N/A 07/31/2016   Procedure: COLONOSCOPY WITH PROPOFOL ;  Surgeon: Elsie Cree, MD;  [facility];  Service: Endoscopy;  Laterality: N/A;   COLONOSCOPY WITH PROPOFOL  N/A 03/08/2020   Procedure: COLONOSCOPY WITH PROPOFOL ;  Surgeon: Cree Elsie, MD;  [facility];  Service:  Endoscopy;  Laterality: N/A;   FOOT SURGERY Right 06/2007   HARDWARE REMOVAL Right 03/16/2024   Procedure: REMOVAL, HARDWARE;  Surgeon: Sheril Coy, MD;  [facility]: [facility];  Service: Orthopedics;  Laterality: Right;  RIGHT ELBOW HARDEARE REMOVAL   IR RADIOLOGIST EVAL & MGMT  12/14/2019   KNEE ARTHROSCOPY  09/2008   left   KNEE ARTHROSCOPY  april 2011   left   NASAL SINUS SURGERY     NASAL SINUS SURGERY  1990   ORIF ELBOW FRACTURE Right 10/15/2022   Procedure: OPEN REDUCTION INTERNAL FIXATION (ORIF) OLECRANON FRACTURE;  Surgeon: Doll Skates, MD;  [facility];  Service: Orthopedics;  Laterality: Right;   ORIF ELBOW FRACTURE Right 04/05/2024   Procedure: OPEN REDUCTION INTERNAL FIXATION (ORIF) ELBOW/OLECRANON FRACTURE;  Surgeon:  Kendal Franky SQUIBB, MD;  [facility];  Service: Orthopedics;  Laterality: Right;   POLYPECTOMY  03/08/2020   Procedure: POLYPECTOMY;  Surgeon: Burnette Fallow, MD;  [facility];  Service: Endoscopy;;   SHOULDER SURGERY  2004   left   Past Surgical History:  Procedure Laterality Date   ABDOMINAL SURGERY     hernia repair   CHOLECYSTECTOMY  [id number]   COLONOSCOPY WITH PROPOFOL  N/A 07/31/2016   Procedure: COLONOSCOPY WITH PROPOFOL ;  Surgeon: Fallow Burnette, MD;  [facility];  Service: Endoscopy;  Laterality: N/A;   COLONOSCOPY WITH PROPOFOL  N/A 03/08/2020   Procedure: COLONOSCOPY WITH PROPOFOL ;  Surgeon: Burnette Fallow, MD;  [facility];  Service: Endoscopy;  Laterality: N/A;   FOOT SURGERY Right 06/2007   HARDWARE REMOVAL Right 03/16/2024   Procedure: REMOVAL, HARDWARE;  Surgeon: Sheril Coy, MD;  [facility];  Service: Orthopedics;  Laterality: Right;  RIGHT ELBOW HARDEARE REMOVAL   IR RADIOLOGIST EVAL & MGMT  12/14/2019   KNEE ARTHROSCOPY  09/2008   left   KNEE ARTHROSCOPY  april 2011   left   NASAL SINUS SURGERY     NASAL SINUS SURGERY  1990   ORIF ELBOW FRACTURE Right 10/15/2022   Procedure: OPEN REDUCTION INTERNAL FIXATION (ORIF) OLECRANON FRACTURE;  Surgeon: Doll Skates, MD;  [facility];  Service: Orthopedics;  Laterality: Right;   ORIF ELBOW FRACTURE Right 04/05/2024   Procedure: OPEN REDUCTION INTERNAL FIXATION (ORIF) ELBOW/OLECRANON FRACTURE;  Surgeon: Kendal Franky SQUIBB, MD;  [facility];  Service: Orthopedics;  Laterality: Right;   POLYPECTOMY  03/08/2020   Procedure: POLYPECTOMY;  Surgeon: Burnette Fallow, MD;  [facility];  Service: Endoscopy;;   SHOULDER SURGERY  2004   left   Past Medical History:  Diagnosis Date   Anxiety    Asthma    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    Suprapubic catheter (HCC)    LMP  (LMP Unknown)   Opioid Risk Score:   Fall Risk Score:  `1  Depression screen [facility] 2/9     04/21/2024  10:25 AM 12/17/2023   11:16 AM 08/20/2023    2:56 PM 07/09/2023   12:02 PM 03/05/2023   11:51 AM 01/03/2023   12:01 PM 11/07/2022    1:20 PM  Depression screen PHQ 2/9  Decreased Interest 1 1 0 0 3 3 1   Down, Depressed, Hopeless 0 1 0 0 1 3 1   PHQ - 2 Score 1 2 0 0 4 6 2     Review of Systems  Genitourinary:        OAB Foley Catheter  Musculoskeletal:  Positive for back pain and gait problem.  All other systems reviewed and are negative.      Objective:   Physical Exam Vitals and nursing note reviewed.  Constitutional:      Appearance: Normal appearance.  Cardiovascular:     Rate and Rhythm: Normal rate and regular rhythm.     Pulses: Normal pulses.     Heart sounds: Normal heart sounds.  Pulmonary:     Effort: Pulmonary effort is normal.     Breath sounds: Normal breath sounds.  Genitourinary:    Comments: Supra Pubic Catheter: Yellow Urine  Musculoskeletal:     Comments: Normal Muscle Bulk and Muscle Testing Reveals:  Upper Extremities: Full ROM and Muscle Strength 5/5 Lumbar Paraspinal Tenderness: L-4-L-5 Lower Extremities: Full ROM and Muscle Strength 5/5 Arises from Table slowly Narrow Based  Gait     Skin:    General: Skin is warm and dry.  Neurological:     Mental Status: She is alert and oriented to person, place, and time.  Psychiatric:        Mood and Affect: Mood normal.        Behavior: Behavior normal.          Assessment & Plan:  1.Spondylosis of Lumbar Region/ Lumbar Facet arthropathy: Lumbar Radiculitis:  Continue gabapentin . Continue to monitor. 06/22/2024 Refilled: Oxycodone  10/325 mg one tablet every 6 hours as needed #120.  Second script e-scribe for the following month. We will continue the opioid monitoring program, this consists of regular clinic visits, examinations, urine drug screen, pill counts as well as use of [place]  Controlled Substance Reporting system. A 12 month History has been reviewed on the [place]  Controlled Substance Reporting System on 04/21/2024. 2. Morbid obesity: Continue Healthy Diet Regime and HEP. 09/022025 3. Type II diabetes:  Dr. Faythe following. 06/22/2024 4. Reactive Depression: No complaints . Continue to monitor.  06/22/2024. 5. Myofascial Muscle Pain: Continue current medication regimen with Tizanidine  . 06/22/2024 6. Bilateral  Chronic Knee Pain: Continue HEP as Tolerated. Continue to Monitor. 06/22/2024 7. Right Greater Trochanter Bursitis:  No complaints today. Continue to alternate Ice and Heat Therapy. Continue to monitor. 06/22/2024     F/U in 2 months

## 2024-06-23 ENCOUNTER — Encounter: Payer: Self-pay | Admitting: Registered Nurse

## 2024-06-23 ENCOUNTER — Encounter: Attending: Registered Nurse | Admitting: Registered Nurse

## 2024-06-23 VITALS — BP 137/75 | HR 83 | Ht 68.0 in | Wt >= 6400 oz

## 2024-06-23 DIAGNOSIS — G894 Chronic pain syndrome: Secondary | ICD-10-CM | POA: Diagnosis not present

## 2024-06-23 DIAGNOSIS — Z79891 Long term (current) use of opiate analgesic: Secondary | ICD-10-CM | POA: Insufficient documentation

## 2024-06-23 DIAGNOSIS — M1712 Unilateral primary osteoarthritis, left knee: Secondary | ICD-10-CM | POA: Diagnosis not present

## 2024-06-23 DIAGNOSIS — M47816 Spondylosis without myelopathy or radiculopathy, lumbar region: Secondary | ICD-10-CM | POA: Diagnosis not present

## 2024-06-23 DIAGNOSIS — M1711 Unilateral primary osteoarthritis, right knee: Secondary | ICD-10-CM | POA: Insufficient documentation

## 2024-06-23 DIAGNOSIS — Z5181 Encounter for therapeutic drug level monitoring: Secondary | ICD-10-CM | POA: Insufficient documentation

## 2024-06-23 DIAGNOSIS — Z466 Encounter for fitting and adjustment of urinary device: Secondary | ICD-10-CM | POA: Diagnosis not present

## 2024-06-23 MED ORDER — OXYCODONE-ACETAMINOPHEN 10-325 MG PO TABS
1.0000 | ORAL_TABLET | Freq: Four times a day (QID) | ORAL | 0 refills | Status: DC | PRN
Start: 2024-06-23 — End: 2024-08-24

## 2024-06-23 MED ORDER — OXYCODONE-ACETAMINOPHEN 10-325 MG PO TABS: 1.0000 | ORAL_TABLET | Freq: Four times a day (QID) | ORAL | 0 refills | Status: DC | PRN

## 2024-06-25 LAB — DRUG TOX MONITOR 1 W/CONF, ORAL FLD
Amphetamines: NEGATIVE ng/mL (ref <10)
Barbiturates: NEGATIVE ng/mL (ref <10)
Benzodiazepines: NEGATIVE ng/mL (ref <0.50)
Buprenorphine: NEGATIVE ng/mL (ref <0.10)
Cocaine: NEGATIVE ng/mL (ref <5.0)
Codeine: NEGATIVE ng/mL (ref <2.5)
Dihydrocodeine: NEGATIVE ng/mL (ref <2.5)
Fentanyl: NEGATIVE ng/mL (ref <0.10)
Heroin Metabolite: NEGATIVE ng/mL (ref <1.0)
Hydrocodone: NEGATIVE ng/mL (ref <2.5)
Hydromorphone: NEGATIVE ng/mL (ref <2.5)
MARIJUANA: NEGATIVE ng/mL (ref <2.5)
MDMA: NEGATIVE ng/mL (ref <10)
Meprobamate: NEGATIVE ng/mL (ref <2.5)
Methadone: NEGATIVE ng/mL (ref <5.0)
Morphine: NEGATIVE ng/mL (ref <2.5)
Nicotine Metabolite: NEGATIVE ng/mL (ref <5.0)
Norhydrocodone: NEGATIVE ng/mL (ref <2.5)
Noroxycodone: 19.3 ng/mL — ABNORMAL HIGH (ref <2.5)
Opiates: POSITIVE ng/mL — AB (ref <2.5)
Oxycodone: 53 ng/mL — ABNORMAL HIGH (ref <2.5)
Oxymorphone: NEGATIVE ng/mL (ref <2.5)
Phencyclidine: NEGATIVE ng/mL (ref <10)
Tapentadol: NEGATIVE ng/mL (ref <5.0)
Tramadol: NEGATIVE ng/mL (ref <5.0)
Zolpidem: NEGATIVE ng/mL (ref <5.0)

## 2024-06-25 LAB — DRUG TOX ALC METAB W/CON, ORAL FLD: Alcohol Metabolite: NEGATIVE ng/mL (ref <25)

## 2024-06-29 DIAGNOSIS — S52301K Unspecified fracture of shaft of right radius, subsequent encounter for closed fracture with nonunion: Secondary | ICD-10-CM | POA: Diagnosis not present

## 2024-07-06 ENCOUNTER — Telehealth: Payer: Self-pay | Admitting: *Deleted

## 2024-07-06 NOTE — Telephone Encounter (Signed)
 Kristen [name] called to report that she is having a very bad muscle spasm on right side of her back and typically would see Dr Babs for trigger point injections but she does not have the funds to pay for the cost of that visit. She is asking about taking more of her tizanidine  and is asking for Fidela to give her a call please. She is supposed to be going to the beach and this is going to be a problem for her.

## 2024-07-07 ENCOUNTER — Telehealth: Payer: Self-pay | Admitting: Registered Nurse

## 2024-07-07 NOTE — Telephone Encounter (Signed)
 Return Ms. Zumstein call, she is currently taking her Tizanidine  three times a day, increase 4 times a day as needed for muscle spasm. She verbalizes understanding. She will call office next week with update.

## 2024-07-12 DIAGNOSIS — Z466 Encounter for fitting and adjustment of urinary device: Secondary | ICD-10-CM | POA: Diagnosis not present

## 2024-07-20 DIAGNOSIS — R339 Retention of urine, unspecified: Secondary | ICD-10-CM | POA: Diagnosis not present

## 2024-07-26 DIAGNOSIS — J0110 Acute frontal sinusitis, unspecified: Secondary | ICD-10-CM | POA: Diagnosis not present

## 2024-07-26 DIAGNOSIS — Z23 Encounter for immunization: Secondary | ICD-10-CM | POA: Diagnosis not present

## 2024-07-26 DIAGNOSIS — R21 Rash and other nonspecific skin eruption: Secondary | ICD-10-CM | POA: Diagnosis not present

## 2024-07-26 DIAGNOSIS — R609 Edema, unspecified: Secondary | ICD-10-CM | POA: Diagnosis not present

## 2024-07-26 DIAGNOSIS — R519 Headache, unspecified: Secondary | ICD-10-CM | POA: Diagnosis not present

## 2024-07-26 DIAGNOSIS — Z6841 Body Mass Index (BMI) 40.0 and over, adult: Secondary | ICD-10-CM | POA: Diagnosis not present

## 2024-08-02 DIAGNOSIS — Z466 Encounter for fitting and adjustment of urinary device: Secondary | ICD-10-CM | POA: Diagnosis not present

## 2024-08-02 DIAGNOSIS — Z9359 Other cystostomy status: Secondary | ICD-10-CM | POA: Diagnosis not present

## 2024-08-23 DIAGNOSIS — Z466 Encounter for fitting and adjustment of urinary device: Secondary | ICD-10-CM | POA: Diagnosis not present

## 2024-08-24 ENCOUNTER — Encounter: Payer: Self-pay | Admitting: Registered Nurse

## 2024-08-24 ENCOUNTER — Encounter: Attending: Registered Nurse | Admitting: Registered Nurse

## 2024-08-24 DIAGNOSIS — M1711 Unilateral primary osteoarthritis, right knee: Secondary | ICD-10-CM | POA: Diagnosis not present

## 2024-08-24 DIAGNOSIS — M170 Bilateral primary osteoarthritis of knee: Secondary | ICD-10-CM | POA: Diagnosis not present

## 2024-08-24 DIAGNOSIS — G894 Chronic pain syndrome: Secondary | ICD-10-CM | POA: Diagnosis not present

## 2024-08-24 DIAGNOSIS — M47816 Spondylosis without myelopathy or radiculopathy, lumbar region: Secondary | ICD-10-CM | POA: Diagnosis not present

## 2024-08-24 DIAGNOSIS — M1712 Unilateral primary osteoarthritis, left knee: Secondary | ICD-10-CM | POA: Insufficient documentation

## 2024-08-24 MED ORDER — OXYCODONE-ACETAMINOPHEN 10-325 MG PO TABS: 1.0000 | ORAL_TABLET | Freq: Four times a day (QID) | ORAL | 0 refills | Status: DC | PRN

## 2024-08-24 MED ORDER — OXYCODONE-ACETAMINOPHEN 10-325 MG PO TABS
1.0000 | ORAL_TABLET | Freq: Four times a day (QID) | ORAL | 0 refills | Status: DC | PRN
Start: 2024-08-24 — End: 2024-08-24

## 2024-08-24 NOTE — Progress Notes (Unsigned)
 Subjective:    Patient ID: Kristen [name], female    DOB: Oct 31, 1965, 58 y.o.   MRN: [id number]  HPI: Kristen [name] is a 58 y.o. female who returns for follow up appointment for chronic pain and medication refill. states *** pain is located in  ***. rates pain ***. current exercise regime is walking and performing stretching exercises.  [name] Morphine  equivalent is *** MME.   Last Oral Swab was Performed on 06/23/2024, it was consistent.    Pain Inventory Average Pain 7 Pain Right Now 7 My pain is sharp, burning, dull, stabbing, and aching  In the last 24 hours, has pain interfered with the following? General activity 9 Relation with others 9 Enjoyment of life 9 What TIME of day is your pain at its worst? morning , daytime, evening, and night Sleep (in general) Poor  Pain is worse with: walking, bending, standing, and some activites Pain improves with: rest, heat/ice, and medication Relief from Meds: 7  Family History  Problem Relation Age of Onset   Cancer Mother        lung   Cancer Father    Diabetes Sister    Breast cancer Paternal Aunt    Hypertension Maternal Grandmother    Stroke Maternal Grandmother    Diabetes Maternal Grandfather    Heart attack Neg Hx    Social History   Socioeconomic History   Marital status: Single    Spouse name: Not on file   Number of children: Not on file   Years of education: Not on file   Highest education level: Not on file  Occupational History   Not on file  Tobacco Use   Smoking status: Never   Smokeless tobacco: Never  Vaping Use   Vaping status: Never Used  Substance and Sexual Activity   Alcohol  use: No    Alcohol /week: 0.0 standard drinks of alcohol    Drug use: No   Sexual activity: Not Currently    Comment: 1st intercourse 89 yo-1 partner  Other Topics Concern   Not on file  Social History Narrative   Not on file   Social Drivers of Health   Financial Resource Strain: Not on file  Food  Insecurity: Low Risk  (06/01/2024)   Received from [facility]   Hunger Vital Sign    Within the past 12 months, you worried that your food would run out before you got money to buy more: Never true    Within the past 12 months, the food you bought just didn't last and you didn't have money to get more. : Never true  Transportation Needs: No Transportation Needs (06/01/2024)   Received from [facility]    In the past 12 months, has lack of reliable transportation kept you from medical appointments, meetings, work or from getting things needed for daily living? : No  Physical Activity: Not on file  Stress: Not on file  Social Connections: Not on file   Past Surgical History:  Procedure Laterality Date   ABDOMINAL SURGERY     hernia repair   CHOLECYSTECTOMY  [id number]   COLONOSCOPY WITH PROPOFOL  N/A 07/31/2016   Procedure: COLONOSCOPY WITH PROPOFOL ;  Surgeon: Elsie Cree, MD;  [facility];  Service: Endoscopy;  Laterality: N/A;   COLONOSCOPY WITH PROPOFOL  N/A 03/08/2020   Procedure: COLONOSCOPY WITH PROPOFOL ;  Surgeon: Cree Elsie, MD;  [facility];  Service: Endoscopy;  Laterality: N/A;   FOOT SURGERY Right 06/2007  HARDWARE REMOVAL Right 03/16/2024   Procedure: REMOVAL, HARDWARE;  Surgeon: Sheril Coy, MD;  [facility];  Service: Orthopedics;  Laterality: Right;  RIGHT ELBOW HARDEARE REMOVAL   IR RADIOLOGIST EVAL & MGMT  12/14/2019   KNEE ARTHROSCOPY  09/2008   left   KNEE ARTHROSCOPY  april 2011   left   NASAL SINUS SURGERY     NASAL SINUS SURGERY  1990   ORIF ELBOW FRACTURE Right 10/15/2022   Procedure: OPEN REDUCTION INTERNAL FIXATION (ORIF) OLECRANON FRACTURE;  Surgeon: Doll Skates, MD;  [facility];  Service: Orthopedics;  Laterality: Right;   ORIF ELBOW FRACTURE Right 04/05/2024   Procedure: OPEN REDUCTION INTERNAL FIXATION (ORIF) ELBOW/OLECRANON FRACTURE;  Surgeon: Kendal Franky SQUIBB, MD;  [facility];  Service:  Orthopedics;  Laterality: Right;   POLYPECTOMY  03/08/2020   Procedure: POLYPECTOMY;  Surgeon: Burnette Fallow, MD;  [facility];  Service: Endoscopy;;   SHOULDER SURGERY  2004   left   Past Surgical History:  Procedure Laterality Date   ABDOMINAL SURGERY     hernia repair   CHOLECYSTECTOMY  [id number]   COLONOSCOPY WITH PROPOFOL  N/A 07/31/2016   Procedure: COLONOSCOPY WITH PROPOFOL ;  Surgeon: Fallow Burnette, MD;  [facility];  Service: Endoscopy;  Laterality: N/A;   COLONOSCOPY WITH PROPOFOL  N/A 03/08/2020   Procedure: COLONOSCOPY WITH PROPOFOL ;  Surgeon: Burnette Fallow, MD;  [facility];  Service: Endoscopy;  Laterality: N/A;   FOOT SURGERY Right 06/2007   HARDWARE REMOVAL Right 03/16/2024   Procedure: REMOVAL, HARDWARE;  Surgeon: Sheril Coy, MD;  [facility];  Service: Orthopedics;  Laterality: Right;  RIGHT ELBOW HARDEARE REMOVAL   IR RADIOLOGIST EVAL & MGMT  12/14/2019   KNEE ARTHROSCOPY  09/2008   left   KNEE ARTHROSCOPY  april 2011   left   NASAL SINUS SURGERY     NASAL SINUS SURGERY  1990   ORIF ELBOW FRACTURE Right 10/15/2022   Procedure: OPEN REDUCTION INTERNAL FIXATION (ORIF) OLECRANON FRACTURE;  Surgeon: Doll Skates, MD;  [facility];  Service: Orthopedics;  Laterality: Right;   ORIF ELBOW FRACTURE Right 04/05/2024   Procedure: OPEN REDUCTION INTERNAL FIXATION (ORIF) ELBOW/OLECRANON FRACTURE;  Surgeon: Kendal Franky SQUIBB, MD;  [facility];  Service: Orthopedics;  Laterality: Right;   POLYPECTOMY  03/08/2020   Procedure: POLYPECTOMY;  Surgeon: Burnette Fallow, MD;  [facility];  Service: Endoscopy;;   SHOULDER SURGERY  2004   left   Past Medical History:  Diagnosis Date   Anxiety    Asthma    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    Suprapubic catheter (HCC)    BP 139/83 (BP Location: Left Arm, Patient Position: Sitting) Comment: 3rd Blood pressure  Pulse 100   Ht 5' 8 (1.727 m)   Wt (!) 394 lb 3.2 oz (178.8 kg)   LMP  (LMP  Unknown)   SpO2 96%   BMI 59.94 kg/m   Opioid Risk Score:   Fall Risk Score:  `1  Depression screen [facility] 2/9     06/23/2024   10:34 AM 04/21/2024   10:25 AM 12/17/2023   11:16 AM 08/20/2023    2:56 PM 07/09/2023   12:02 PM 03/05/2023   11:51 AM 01/03/2023   12:01 PM  Depression screen PHQ 2/9  Decreased Interest 1 1 1  0 0 3 3  Down, Depressed, Hopeless 1 0 1 0 0 1 3  PHQ - 2 Score 2 1 2  0 0 4 6      Review of Systems  Musculoskeletal:  Positive for back pain, gait problem and myalgias.       Mid to low back pain, right knee pain  All other systems reviewed and are negative.      Objective:   Physical Exam        Assessment & Plan:  1.Spondylosis of Lumbar Region/ Lumbar Facet arthropathy: Lumbar Radiculitis: Continue gabapentin . Continue to monitor. 06/22/2024 Refilled: Oxycodone  10/325 mg one tablet every 6 hours as needed #120.  Second script e-scribe for the following month. We will continue the opioid monitoring program, this consists of regular clinic visits, examinations, urine drug screen, pill counts as well as use of [place]  Controlled Substance Reporting system. A 12 month History has been reviewed on the [place]  Controlled Substance Reporting System on 04/21/2024. 2. Morbid obesity: Continue Healthy Diet Regime and HEP. 09/022025 3. Type II diabetes:  Dr. Faythe following. 06/22/2024 4. Reactive Depression: No complaints . Continue to monitor.  06/22/2024. 5. Myofascial Muscle Pain: Continue current medication  regimen with Tizanidine  . 06/22/2024 6. Bilateral  Chronic Knee Pain: Continue HEP as Tolerated. Continue to Monitor. 06/22/2024 7. Right Greater Trochanter Bursitis:  No complaints today. Continue to alternate Ice and Heat Therapy. Continue to monitor. 06/22/2024     F/U in 2 months

## 2024-09-13 DIAGNOSIS — Z466 Encounter for fitting and adjustment of urinary device: Secondary | ICD-10-CM | POA: Diagnosis not present

## 2024-09-28 DIAGNOSIS — Z0000 Encounter for general adult medical examination without abnormal findings: Secondary | ICD-10-CM | POA: Diagnosis not present

## 2024-09-29 LAB — LIPID PANEL
Cholesterol: 169 (ref 0–200)
HDL: 50 (ref 35–70)
LDL Cholesterol: 96
LDl/HDL Ratio: 3.4
Triglycerides: 129 (ref 40–160)

## 2024-09-29 LAB — BASIC METABOLIC PANEL WITH GFR
BUN: 20 (ref 4–21)
CO2: 29 — AB (ref 13–22)
Chloride: 103 (ref 99–108)
Creatinine: 1.5 — AB (ref 0.5–1.1)
Glucose: 219
Potassium: 4.7 meq/L (ref 3.5–5.1)
Sodium: 138 (ref 137–147)

## 2024-09-29 LAB — CBC: RBC: 4.61 (ref 3.87–5.11)

## 2024-09-29 LAB — CBC AND DIFFERENTIAL
HCT: 43 (ref 36–46)
Hemoglobin: 14.4 (ref 12.0–16.0)
Neutrophils Absolute: 5.8
Platelets: 244 K/uL (ref 150–400)
WBC: 8.9

## 2024-09-29 LAB — VITAMIN D 25 HYDROXY (VIT D DEFICIENCY, FRACTURES): Vit D, 25-Hydroxy: 50.7

## 2024-09-29 LAB — HEMOGLOBIN A1C: Hemoglobin A1C: 8.2

## 2024-09-29 LAB — COMPREHENSIVE METABOLIC PANEL WITH GFR
Albumin: 3.8 (ref 3.5–5.0)
Calcium: 9.3 (ref 8.7–10.7)

## 2024-09-29 LAB — TSH: TSH: 2.54 (ref 0.41–5.90)

## 2024-10-25 ENCOUNTER — Encounter: Admitting: Registered Nurse

## 2024-10-25 NOTE — Progress Notes (Deleted)
 "  Subjective:    Patient ID: Kristen [name], female    DOB: 10-18-1966, 59 y.o.   MRN: [id number]  HPI   Pain Inventory Average Pain {NUMBERS; 0-10:5044} Pain Right Now {NUMBERS; 0-10:5044} My pain is {PAIN DESCRIPTION:21022940}  In the last 24 hours, has pain interfered with the following? General activity {NUMBERS; 0-10:5044} Relation with others {NUMBERS; 0-10:5044} Enjoyment of life {NUMBERS; 0-10:5044} What TIME of day is your pain at its worst? {time of day:24191} Sleep (in general) {BHH GOOD/FAIR/POOR:22877}  Pain is worse with: {ACTIVITIES:21022942} Pain improves with: {PAIN IMPROVES TPUY:78977056} Relief from Meds: {NUMBERS; 0-10:5044}  Family History  Problem Relation Age of Onset   Cancer Mother        lung   Cancer Father    Diabetes Sister    Breast cancer Paternal Aunt    Hypertension Maternal Grandmother    Stroke Maternal Grandmother    Diabetes Maternal Grandfather    Heart attack Neg Hx    Social History   Socioeconomic History   Marital status: Single    Spouse name: Not on file   Number of children: Not on file   Years of education: Not on file   Highest education level: Not on file  Occupational History   Not on file  Tobacco Use   Smoking status: Never   Smokeless tobacco: Never  Vaping Use   Vaping status: Never Used  Substance and Sexual Activity   Alcohol  use: No    Alcohol /week: 0.0 standard drinks of alcohol    Drug use: No   Sexual activity: Not Currently    Comment: 1st intercourse 72 yo-1 partner  Other Topics Concern   Not on file  Social History Narrative   Not on file   Social Drivers of Health   Tobacco Use: Low Risk (08/24/2024)   Patient History    Smoking Tobacco Use: Never    Smokeless Tobacco Use: Never    Passive Exposure: Not on file  Financial Resource Strain: Not on file  Food Insecurity: Low Risk (06/01/2024)   Received from [facility]   Epic    Within the past 12 months, you worried that your food  would run out before you got money to buy more: Never true    Within the past 12 months, the food you bought just didn't last and you didn't have money to get more. : Never true  Transportation Needs: No Transportation Needs (06/01/2024)   Received from [facility]    In the past 12 months, has lack of reliable transportation kept you from medical appointments, meetings, work or from getting things needed for daily living? : No  Physical Activity: Not on file  Stress: Not on file  Social Connections: Not on file  Depression (PHQ2-9): Low Risk (06/23/2024)   Depression (PHQ2-9)    PHQ-2 Score: 2  Alcohol  Screen: Not on file  Housing: Low Risk (06/01/2024)   Received from [facility]   Epic    What is your living situation today?: I have a steady place to live    Think about the place you live. Do you have problems with any of the following? Choose all that apply:: None/None on this list  Utilities: Low Risk (06/01/2024)   Received from [facility]   Utilities    In the past 12 months has the electric, gas, oil, or water company threatened to shut off services in your home? : No  Health Literacy: Not on file  Past Surgical History:  Procedure Laterality Date   ABDOMINAL SURGERY     hernia repair   CHOLECYSTECTOMY  [id number]   COLONOSCOPY WITH PROPOFOL  N/A 07/31/2016   Procedure: COLONOSCOPY WITH PROPOFOL ;  Surgeon: Elsie Cree, MD;  Location: [facility];  Service: Endoscopy;  Laterality: N/A;   COLONOSCOPY WITH PROPOFOL  N/A 03/08/2020   Procedure: COLONOSCOPY WITH PROPOFOL ;  Surgeon: Cree Elsie, MD;  Location: [facility];  Service: Endoscopy;  Laterality: N/A;   FOOT SURGERY Right 06/2007   HARDWARE REMOVAL Right 03/16/2024   Procedure: REMOVAL, HARDWARE;  Surgeon: Sheril Coy, MD;  Location: [facility];  Service: Orthopedics;  Laterality: Right;  RIGHT ELBOW HARDEARE REMOVAL   IR RADIOLOGIST EVAL & MGMT  12/14/2019   KNEE ARTHROSCOPY  09/2008   left    KNEE ARTHROSCOPY  april 2011   left   NASAL SINUS SURGERY     NASAL SINUS SURGERY  1990   ORIF ELBOW FRACTURE Right 10/15/2022   Procedure: OPEN REDUCTION INTERNAL FIXATION (ORIF) OLECRANON FRACTURE;  Surgeon: Doll Skates, MD;  [facility]: [facility];  Service: Orthopedics;  Laterality: Right;   ORIF ELBOW FRACTURE Right 04/05/2024   Procedure: OPEN REDUCTION INTERNAL FIXATION (ORIF) ELBOW/OLECRANON FRACTURE;  Surgeon: Kendal Franky SQUIBB, MD;  Location: [facility];  Service: Orthopedics;  Laterality: Right;   POLYPECTOMY  03/08/2020   Procedure: POLYPECTOMY;  Surgeon: Cree Elsie, MD;  [facility];  Service: Endoscopy;;   SHOULDER SURGERY  2004   left   Past Surgical History:  Procedure Laterality Date   ABDOMINAL SURGERY     hernia repair   CHOLECYSTECTOMY  [id number]   COLONOSCOPY WITH PROPOFOL  N/A 07/31/2016   Procedure: COLONOSCOPY WITH PROPOFOL ;  Surgeon: Elsie Cree, MD;  [facility];  Service: Endoscopy;  Laterality: N/A;   COLONOSCOPY WITH PROPOFOL  N/A 03/08/2020   Procedure: COLONOSCOPY WITH PROPOFOL ;  Surgeon: Cree Elsie, MD;  [facility];  Service: Endoscopy;  Laterality: N/A;   FOOT SURGERY Right 06/2007   HARDWARE REMOVAL Right 03/16/2024   Procedure: REMOVAL, HARDWARE;  Surgeon: Sheril Coy, MD;  [facility];  Service: Orthopedics;  Laterality: Right;  RIGHT ELBOW HARDEARE REMOVAL   IR RADIOLOGIST EVAL & MGMT  12/14/2019   KNEE ARTHROSCOPY  09/2008   left   KNEE ARTHROSCOPY  april 2011   left   NASAL SINUS SURGERY     NASAL SINUS SURGERY  1990   ORIF ELBOW FRACTURE Right 10/15/2022   Procedure: OPEN REDUCTION INTERNAL FIXATION (ORIF) OLECRANON FRACTURE;  Surgeon: Doll Skates, MD;  [facility];  Service: Orthopedics;  Laterality: Right;   ORIF ELBOW FRACTURE Right 04/05/2024   Procedure: OPEN REDUCTION INTERNAL FIXATION (ORIF) ELBOW/OLECRANON FRACTURE;  Surgeon: Kendal Franky SQUIBB, MD;  [facility];  Service: Orthopedics;   Laterality: Right;   POLYPECTOMY  03/08/2020   Procedure: POLYPECTOMY;  Surgeon: Cree Elsie, MD;  [facility];  Service: Endoscopy;;   SHOULDER SURGERY  2004   left   Past Medical History:  Diagnosis Date   Anxiety    Asthma    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

## 2024-10-26 ENCOUNTER — Encounter: Attending: Registered Nurse | Admitting: Registered Nurse

## 2024-10-27 ENCOUNTER — Encounter: Attending: Registered Nurse | Admitting: Registered Nurse

## 2024-10-27 ENCOUNTER — Encounter: Payer: Self-pay | Admitting: Registered Nurse

## 2024-10-27 VITALS — BP 134/79 | HR 90

## 2024-10-27 DIAGNOSIS — M1712 Unilateral primary osteoarthritis, left knee: Secondary | ICD-10-CM | POA: Diagnosis not present

## 2024-10-27 DIAGNOSIS — G894 Chronic pain syndrome: Secondary | ICD-10-CM | POA: Insufficient documentation

## 2024-10-27 DIAGNOSIS — M47816 Spondylosis without myelopathy or radiculopathy, lumbar region: Secondary | ICD-10-CM | POA: Diagnosis not present

## 2024-10-27 DIAGNOSIS — Z5181 Encounter for therapeutic drug level monitoring: Secondary | ICD-10-CM | POA: Diagnosis not present

## 2024-10-27 DIAGNOSIS — Z79891 Long term (current) use of opiate analgesic: Secondary | ICD-10-CM | POA: Diagnosis not present

## 2024-10-27 MED ORDER — OXYCODONE-ACETAMINOPHEN 10-325 MG PO TABS: 1.0000 | ORAL_TABLET | Freq: Four times a day (QID) | ORAL | 0 refills | Status: AC | PRN

## 2024-10-27 MED ORDER — OXYCODONE-ACETAMINOPHEN 10-325 MG PO TABS: 1.0000 | ORAL_TABLET | Freq: Four times a day (QID) | ORAL | 0 refills | Status: DC | PRN

## 2024-10-27 NOTE — Progress Notes (Signed)
 "  Subjective:    Patient ID: Kristen [name], female    DOB: 01-15-1966, 59 y.o.   MRN: [id number]  HPI: Kristen [name] is a 59 y.o. female who returns for follow up appointment for chronic pain and medication refill. She states her pain is located in her lower back and left knee pain. She rates her pain 8. Her current exercise regime is walking and performing stretching exercises.  Kristen [name] Morphine  equivalent is 60. .   Oral Swab was Performed Today.     Pain Inventory Average Pain 7 Pain Right Now 8 My pain is sharp, burning, dull, stabbing, and aching  In the last 24 hours, has pain interfered with the following? General activity 9 Relation with others 9 Enjoyment of life 9 What TIME of day is your pain at its worst? morning , daytime, evening, and night Sleep (in general) Poor  Pain is worse with: walking, bending, sitting, standing, and some activites Pain improves with: rest, heat/ice, and medication Relief from Meds: 6  Family History  Problem Relation Age of Onset   Cancer Mother        lung   Cancer Father    Diabetes Sister    Breast cancer Paternal Aunt    Hypertension Maternal Grandmother    Stroke Maternal Grandmother    Diabetes Maternal Grandfather    Heart attack Neg Hx    Social History   Socioeconomic History   Marital status: Single    Spouse name: Not on file   Number of children: Not on file   Years of education: Not on file   Highest education level: Not on file  Occupational History   Not on file  Tobacco Use   Smoking status: Never   Smokeless tobacco: Never  Vaping Use   Vaping status: Never Used  Substance and Sexual Activity   Alcohol  use: No    Alcohol /week: 0.0 standard drinks of alcohol    Drug use: No   Sexual activity: Not Currently    Comment: 1st intercourse 38 yo-1 partner  Other Topics Concern   Not on file  Social History Narrative   Not on file   Social Drivers of Health   Tobacco Use: Low Risk  (08/24/2024)   Patient History    Smoking Tobacco Use: Never    Smokeless Tobacco Use: Never    Passive Exposure: Not on file  Financial Resource Strain: Not on file  Food Insecurity: Low Risk (06/01/2024)   Received from [facility]   Epic    Within the past 12 months, you worried that your food would run out before you got money to buy more: Never true    Within the past 12 months, the food you bought just didn't last and you didn't have money to get more. : Never true  Transportation Needs: No Transportation Needs (06/01/2024)   Received from [facility]    In the past 12 months, has lack of reliable transportation kept you from medical appointments, meetings, work or from getting things needed for daily living? : No  Physical Activity: Not on file  Stress: Not on file  Social Connections: Not on file  Depression (PHQ2-9): Low Risk (06/23/2024)   Depression (PHQ2-9)    PHQ-2 Score: 2  Alcohol  Screen: Not on file  Housing: Low Risk (06/01/2024)   Received from [facility]   Epic    What is your living situation today?: I have a steady place to live  Think about the place you live. Do you have problems with any of the following? Choose all that apply:: None/None on this list  Utilities: Low Risk (06/01/2024)   Received from [facility]   Utilities    In the past 12 months has the electric, gas, oil, or water company threatened to shut off services in your home? : No  Health Literacy: Not on file   Past Surgical History:  Procedure Laterality Date   ABDOMINAL SURGERY     hernia repair   CHOLECYSTECTOMY  [id number]   COLONOSCOPY WITH PROPOFOL  N/A 07/31/2016   Procedure: COLONOSCOPY WITH PROPOFOL ;  Surgeon: Elsie Cree, MD;  [facility];  Service: Endoscopy;  Laterality: N/A;   COLONOSCOPY WITH PROPOFOL  N/A 03/08/2020   Procedure: COLONOSCOPY WITH PROPOFOL ;  Surgeon: Cree Elsie, MD;  [facility];  Service: Endoscopy;  Laterality:  N/A;   FOOT SURGERY Right 06/2007   HARDWARE REMOVAL Right 03/16/2024   Procedure: REMOVAL, HARDWARE;  Surgeon: Sheril Coy, MD;  [facility];  Service: Orthopedics;  Laterality: Right;  RIGHT ELBOW HARDEARE REMOVAL   IR RADIOLOGIST EVAL & MGMT  12/14/2019   KNEE ARTHROSCOPY  09/2008   left   KNEE ARTHROSCOPY  april 2011   left   NASAL SINUS SURGERY     NASAL SINUS SURGERY  1990   ORIF ELBOW FRACTURE Right 10/15/2022   Procedure: OPEN REDUCTION INTERNAL FIXATION (ORIF) OLECRANON FRACTURE;  Surgeon: Doll Skates, MD;  [facility];  Service: Orthopedics;  Laterality: Right;   ORIF ELBOW FRACTURE Right 04/05/2024   Procedure: OPEN REDUCTION INTERNAL FIXATION (ORIF) ELBOW/OLECRANON FRACTURE;  Surgeon: Kendal Franky SQUIBB, MD;  [facility];  Service: Orthopedics;  Laterality: Right;   POLYPECTOMY  03/08/2020   Procedure: POLYPECTOMY;  Surgeon: Cree Elsie, MD;  [facility];  Service: Endoscopy;;   SHOULDER SURGERY  2004   left   Past Surgical History:  Procedure Laterality Date   ABDOMINAL SURGERY     hernia repair   CHOLECYSTECTOMY  [id number]   COLONOSCOPY WITH PROPOFOL  N/A 07/31/2016   Procedure: COLONOSCOPY WITH PROPOFOL ;  Surgeon: Elsie Cree, MD;  [facility];  Service: Endoscopy;  Laterality: N/A;   COLONOSCOPY WITH PROPOFOL  N/A 03/08/2020   Procedure: COLONOSCOPY WITH PROPOFOL ;  Surgeon: Cree Elsie, MD;  [facility];  Service: Endoscopy;  Laterality: N/A;   FOOT SURGERY Right 06/2007   HARDWARE REMOVAL Right 03/16/2024   Procedure: REMOVAL, HARDWARE;  Surgeon: Sheril Coy, MD;  [facility];  Service: Orthopedics;  Laterality: Right;  RIGHT ELBOW HARDEARE REMOVAL   IR RADIOLOGIST EVAL & MGMT  12/14/2019   KNEE ARTHROSCOPY  09/2008   left   KNEE ARTHROSCOPY  april 2011   left   NASAL SINUS SURGERY     NASAL SINUS SURGERY  1990   ORIF ELBOW FRACTURE Right 10/15/2022   Procedure: OPEN REDUCTION INTERNAL FIXATION (ORIF)  OLECRANON FRACTURE;  Surgeon: Doll Skates, MD;  [facility];  Service: Orthopedics;  Laterality: Right;   ORIF ELBOW FRACTURE Right 04/05/2024   Procedure: OPEN REDUCTION INTERNAL FIXATION (ORIF) ELBOW/OLECRANON FRACTURE;  Surgeon: Kendal Franky SQUIBB, MD;  [facility];  Service: Orthopedics;  Laterality: Right;   POLYPECTOMY  03/08/2020   Procedure: POLYPECTOMY;  Surgeon: Cree Elsie, MD;  [facility];  Service: Endoscopy;;   SHOULDER SURGERY  2004   left   Past Medical History:  Diagnosis Date   Anxiety    Asthma    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       B/L elbow pain  Left knee pain  All other systems reviewed and are negative.      Objective:   Physical Exam Vitals and nursing note reviewed.  Constitutional:      Appearance: Normal appearance.  Cardiovascular:     Rate and Rhythm: Normal rate and regular rhythm.     Pulses: Normal pulses.     Heart sounds: Normal heart sounds.  Pulmonary:     Effort:  Pulmonary effort is normal.     Breath sounds: Normal breath sounds.  Genitourinary:    Comments: Supra Pubic Catheter: Yellow Urine Dressing Intact Musculoskeletal:     Comments: Normal Muscle Bulk and Muscle Testing Reveals:  Upper Extremities: Full ROM and Muscle Strength 5/5  Lumbar Paraspinal Tenderness: L-3-L-5 Lower Extremities: Full ROM and Muscle Strength 5/5 Arises from Table slowly Antalgic  Gait     Skin:    General: Skin is warm and dry.  Neurological:     Mental Status: She is alert and oriented to person, place, and time.  Psychiatric:        Mood and Affect: Mood normal.        Behavior: Behavior normal.          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 Continue to monitor. 10/27/2024 Refilled: Oxycodone  10/325 mg one tablet every 6 hours as needed #120.  Second script e-scribe for the following month. We will continue the opioid monitoring program, this consists of regular clinic visits, examinations, urine drug screen, pill counts as well as use of [place]  Controlled Substance Reporting system. A 12 month History has been reviewed on the [place]  Controlled Substance Reporting System on 10/27/2024. 2. Morbid obesity: Continue Healthy Diet Regime and HEP. 01/072026 3. Type II diabetes:  Dr. Faythe following. 10/27/2024 4. Reactive Depression: No complaints . Continue to monitor.  10/27/2024. 5. Myofascial Muscle Pain: Continue current medication regimen with Tizanidine  . 10/27/2024 6. Left Chronic Knee Pain: Continue HEP as Tolerated. Continue to Monitor. 10/27/2024 7.  Right Greater Trochanter Bursitis:  No complaints today. Continue to alternate Ice and Heat Therapy. Continue to monitor. 10/26/2024     F/U in 2 months               "

## 2024-10-31 LAB — DRUG TOX MONITOR 1 W/CONF, ORAL FLD
Amphetamines: NEGATIVE ng/mL
Barbiturates: NEGATIVE ng/mL
Benzodiazepines: NEGATIVE ng/mL
Buprenorphine: NEGATIVE ng/mL
Cocaine: NEGATIVE ng/mL
Codeine: NEGATIVE ng/mL
Dihydrocodeine: NEGATIVE ng/mL
Fentanyl: NEGATIVE ng/mL
Heroin Metabolite: NEGATIVE ng/mL
Hydrocodone: NEGATIVE ng/mL
Hydromorphone: NEGATIVE ng/mL
MARIJUANA: NEGATIVE ng/mL
MDMA: NEGATIVE ng/mL
Meprobamate: NEGATIVE ng/mL
Methadone: NEGATIVE ng/mL
Morphine: NEGATIVE ng/mL
Nicotine Metabolite: NEGATIVE ng/mL
Norhydrocodone: NEGATIVE ng/mL
Noroxycodone: 57.1 ng/mL — ABNORMAL HIGH
Opiates: POSITIVE ng/mL — AB
Oxycodone: 107.6 ng/mL — ABNORMAL HIGH
Oxymorphone: NEGATIVE ng/mL
Phencyclidine: NEGATIVE ng/mL
Tapentadol: NEGATIVE ng/mL
Tramadol: NEGATIVE ng/mL
Zolpidem: NEGATIVE ng/mL

## 2024-10-31 LAB — DRUG TOX ALC METAB W/CON, ORAL FLD: Alcohol Metabolite: NEGATIVE ng/mL

## 2024-12-23 ENCOUNTER — Encounter: Admitting: Registered Nurse
